# Patient Record
Sex: Female | Born: 1965 | Race: Black or African American | Hispanic: No | Marital: Married | Smoking: Never smoker
Health system: Southern US, Community
[De-identification: ages and names within clinical notes are randomized; demographics above are authoritative.]

## PROBLEM LIST (undated history)

## (undated) DIAGNOSIS — K228 Other specified diseases of esophagus: Secondary | ICD-10-CM

## (undated) DIAGNOSIS — T4145XA Adverse effect of unspecified anesthetic, initial encounter: Secondary | ICD-10-CM

## (undated) DIAGNOSIS — K2281 Esophageal polyp: Secondary | ICD-10-CM

## (undated) DIAGNOSIS — J302 Other seasonal allergic rhinitis: Secondary | ICD-10-CM

## (undated) DIAGNOSIS — D563 Thalassemia minor: Secondary | ICD-10-CM

## (undated) DIAGNOSIS — F419 Anxiety disorder, unspecified: Secondary | ICD-10-CM

## (undated) DIAGNOSIS — J45909 Unspecified asthma, uncomplicated: Secondary | ICD-10-CM

## (undated) DIAGNOSIS — D649 Anemia, unspecified: Secondary | ICD-10-CM

## (undated) DIAGNOSIS — T8859XA Other complications of anesthesia, initial encounter: Secondary | ICD-10-CM

## (undated) DIAGNOSIS — K219 Gastro-esophageal reflux disease without esophagitis: Secondary | ICD-10-CM

## (undated) DIAGNOSIS — R7303 Prediabetes: Secondary | ICD-10-CM

## (undated) DIAGNOSIS — I10 Essential (primary) hypertension: Secondary | ICD-10-CM

## (undated) DIAGNOSIS — K5909 Other constipation: Secondary | ICD-10-CM

## (undated) DIAGNOSIS — E669 Obesity, unspecified: Secondary | ICD-10-CM

## (undated) DIAGNOSIS — I8500 Esophageal varices without bleeding: Secondary | ICD-10-CM

## (undated) DIAGNOSIS — K760 Fatty (change of) liver, not elsewhere classified: Secondary | ICD-10-CM

## (undated) HISTORY — PX: ECTOPIC PREGNANCY SURGERY: SHX613

## (undated) HISTORY — DX: Fatty (change of) liver, not elsewhere classified: K76.0

## (undated) HISTORY — DX: Obesity, unspecified: E66.9

## (undated) HISTORY — DX: Anxiety disorder, unspecified: F41.9

## (undated) HISTORY — PX: MOUTH SURGERY: SHX715

## (undated) HISTORY — DX: Other constipation: K59.09

## (undated) HISTORY — DX: Esophageal polyp: K22.81

## (undated) HISTORY — PX: PARTIAL HYSTERECTOMY: SHX80

## (undated) HISTORY — DX: Gastro-esophageal reflux disease without esophagitis: K21.9

## (undated) HISTORY — DX: Other specified diseases of esophagus: K22.8

## (undated) HISTORY — DX: Essential (primary) hypertension: I10

## (undated) HISTORY — DX: Other seasonal allergic rhinitis: J30.2

## (undated) HISTORY — PX: TUBAL LIGATION: SHX77

---

## 1985-09-27 HISTORY — PX: OTHER SURGICAL HISTORY: SHX169

## 1995-09-28 HISTORY — PX: CHOLECYSTECTOMY: SHX55

## 1997-04-30 HISTORY — PX: UPPER GASTROINTESTINAL ENDOSCOPY: SHX188

## 2001-05-01 ENCOUNTER — Ambulatory Visit (HOSPITAL_COMMUNITY): Admission: RE | Admit: 2001-05-01 | Discharge: 2001-05-01 | Payer: Self-pay | Admitting: Otolaryngology

## 2001-05-01 ENCOUNTER — Encounter: Payer: Self-pay | Admitting: Otolaryngology

## 2001-08-18 ENCOUNTER — Ambulatory Visit (HOSPITAL_COMMUNITY): Admission: RE | Admit: 2001-08-18 | Discharge: 2001-08-18 | Payer: Self-pay | Admitting: Internal Medicine

## 2001-08-18 ENCOUNTER — Encounter (INDEPENDENT_AMBULATORY_CARE_PROVIDER_SITE_OTHER): Payer: Self-pay | Admitting: Internal Medicine

## 2001-11-28 ENCOUNTER — Ambulatory Visit (HOSPITAL_COMMUNITY): Admission: RE | Admit: 2001-11-28 | Discharge: 2001-11-28 | Payer: Self-pay | Admitting: Family Medicine

## 2001-11-28 ENCOUNTER — Encounter: Payer: Self-pay | Admitting: Family Medicine

## 2002-08-03 ENCOUNTER — Encounter: Payer: Self-pay | Admitting: Internal Medicine

## 2002-08-03 ENCOUNTER — Emergency Department (HOSPITAL_COMMUNITY): Admission: EM | Admit: 2002-08-03 | Discharge: 2002-08-04 | Payer: Self-pay | Admitting: Internal Medicine

## 2002-10-05 ENCOUNTER — Ambulatory Visit (HOSPITAL_COMMUNITY): Admission: RE | Admit: 2002-10-05 | Discharge: 2002-10-05 | Payer: Self-pay | Admitting: Otolaryngology

## 2002-10-05 ENCOUNTER — Encounter: Payer: Self-pay | Admitting: Otolaryngology

## 2002-11-15 ENCOUNTER — Emergency Department (HOSPITAL_COMMUNITY): Admission: EM | Admit: 2002-11-15 | Discharge: 2002-11-15 | Payer: Self-pay | Admitting: Emergency Medicine

## 2003-10-10 ENCOUNTER — Ambulatory Visit (HOSPITAL_COMMUNITY): Admission: RE | Admit: 2003-10-10 | Discharge: 2003-10-10 | Payer: Self-pay | Admitting: Family Medicine

## 2004-03-06 ENCOUNTER — Emergency Department (HOSPITAL_COMMUNITY): Admission: EM | Admit: 2004-03-06 | Discharge: 2004-03-06 | Payer: Self-pay | Admitting: Emergency Medicine

## 2004-03-31 ENCOUNTER — Encounter: Admission: RE | Admit: 2004-03-31 | Discharge: 2004-03-31 | Payer: Self-pay | Admitting: Oncology

## 2004-03-31 ENCOUNTER — Encounter (HOSPITAL_COMMUNITY): Admission: RE | Admit: 2004-03-31 | Discharge: 2004-04-30 | Payer: Self-pay | Admitting: Oncology

## 2004-04-17 ENCOUNTER — Ambulatory Visit (HOSPITAL_COMMUNITY): Admission: RE | Admit: 2004-04-17 | Discharge: 2004-04-17 | Payer: Self-pay | Admitting: Family Medicine

## 2004-05-05 ENCOUNTER — Encounter: Admission: RE | Admit: 2004-05-05 | Discharge: 2004-05-05 | Payer: Self-pay | Admitting: Oncology

## 2004-05-11 ENCOUNTER — Ambulatory Visit (HOSPITAL_COMMUNITY): Admission: RE | Admit: 2004-05-11 | Discharge: 2004-05-11 | Payer: Self-pay | Admitting: Internal Medicine

## 2004-05-11 HISTORY — PX: UPPER GASTROINTESTINAL ENDOSCOPY: SHX188

## 2004-08-10 ENCOUNTER — Ambulatory Visit: Payer: Self-pay | Admitting: Internal Medicine

## 2004-09-07 ENCOUNTER — Ambulatory Visit (HOSPITAL_COMMUNITY): Admission: RE | Admit: 2004-09-07 | Discharge: 2004-09-07 | Payer: Self-pay | Admitting: *Deleted

## 2004-10-02 ENCOUNTER — Ambulatory Visit: Payer: Self-pay | Admitting: Family Medicine

## 2005-02-05 ENCOUNTER — Ambulatory Visit: Payer: Self-pay | Admitting: Family Medicine

## 2005-06-10 ENCOUNTER — Ambulatory Visit: Payer: Self-pay | Admitting: Family Medicine

## 2005-07-29 ENCOUNTER — Ambulatory Visit: Payer: Self-pay | Admitting: Family Medicine

## 2006-03-01 ENCOUNTER — Ambulatory Visit: Payer: Self-pay | Admitting: Family Medicine

## 2006-05-26 ENCOUNTER — Ambulatory Visit: Payer: Self-pay | Admitting: Family Medicine

## 2006-08-04 ENCOUNTER — Ambulatory Visit: Payer: Self-pay | Admitting: Family Medicine

## 2006-11-10 ENCOUNTER — Ambulatory Visit (HOSPITAL_COMMUNITY): Admission: RE | Admit: 2006-11-10 | Discharge: 2006-11-10 | Payer: Self-pay | Admitting: Family Medicine

## 2006-11-10 ENCOUNTER — Ambulatory Visit: Payer: Self-pay | Admitting: Family Medicine

## 2006-11-10 LAB — CONVERTED CEMR LAB
Iron: 26 ug/dL — ABNORMAL LOW (ref 42–145)
Retic Count, Absolute: 32.9 (ref 19.0–186.0)
Retic Ct Pct: 0.6 % (ref 0.4–3.1)
UIBC: 390 ug/dL
Vitamin B-12: 336 pg/mL (ref 211–911)

## 2007-04-24 ENCOUNTER — Ambulatory Visit: Payer: Self-pay | Admitting: Family Medicine

## 2007-04-25 ENCOUNTER — Encounter: Payer: Self-pay | Admitting: Family Medicine

## 2007-05-01 ENCOUNTER — Encounter: Payer: Self-pay | Admitting: Family Medicine

## 2007-05-01 LAB — CONVERTED CEMR LAB
ALT: 18 units/L (ref 0–35)
Basophils Relative: 1 % (ref 0–1)
Bilirubin, Direct: 0.2 mg/dL (ref 0.0–0.3)
Chloride: 107 meq/L (ref 96–112)
Cholesterol: 147 mg/dL (ref 0–200)
Glucose, Bld: 91 mg/dL (ref 70–99)
HCT: 37.1 % (ref 36.0–46.0)
Hemoglobin: 10.8 g/dL — ABNORMAL LOW (ref 12.0–15.0)
Iron: 36 ug/dL — ABNORMAL LOW (ref 42–145)
Lymphocytes Relative: 74 % — ABNORMAL HIGH (ref 12–46)
Lymphs Abs: 3.7 10*3/uL — ABNORMAL HIGH (ref 0.7–3.3)
MCHC: 29.1 g/dL — ABNORMAL LOW (ref 30.0–36.0)
MCV: 65.2 fL — ABNORMAL LOW (ref 78.0–100.0)
Monocytes Relative: 6 % (ref 3–11)
Neutro Abs: 0.9 10*3/uL — ABNORMAL LOW (ref 1.7–7.7)
Neutrophils Relative %: 17 % — ABNORMAL LOW (ref 43–77)
Potassium: 4.5 meq/L (ref 3.5–5.3)
Retic Count, Absolute: 79.7 (ref 19.0–186.0)
Saturation Ratios: 9 % — ABNORMAL LOW (ref 20–55)
Sodium: 140 meq/L (ref 135–145)
TIBC: 408 ug/dL (ref 250–470)
TSH: 0.522 microintl units/mL (ref 0.350–5.50)
Total CHOL/HDL Ratio: 4.7
Total Protein: 8.4 g/dL — ABNORMAL HIGH (ref 6.0–8.3)
UIBC: 372 ug/dL
VLDL: 26 mg/dL (ref 0–40)

## 2007-05-02 ENCOUNTER — Ambulatory Visit (HOSPITAL_COMMUNITY): Admission: RE | Admit: 2007-05-02 | Discharge: 2007-05-02 | Payer: Self-pay | Admitting: Family Medicine

## 2007-05-17 ENCOUNTER — Ambulatory Visit (HOSPITAL_COMMUNITY): Admission: RE | Admit: 2007-05-17 | Discharge: 2007-05-17 | Payer: Self-pay | Admitting: Family Medicine

## 2007-05-23 ENCOUNTER — Encounter: Admission: RE | Admit: 2007-05-23 | Discharge: 2007-05-23 | Payer: Self-pay | Admitting: Family Medicine

## 2007-05-30 ENCOUNTER — Encounter: Admission: RE | Admit: 2007-05-30 | Discharge: 2007-05-30 | Payer: Self-pay | Admitting: Family Medicine

## 2007-06-26 ENCOUNTER — Ambulatory Visit: Payer: Self-pay | Admitting: Family Medicine

## 2007-06-26 LAB — CONVERTED CEMR LAB
Ferritin: 181 ng/mL (ref 10–291)
Folate: 9.5 ng/mL
Hemoglobin: 11.9 g/dL — ABNORMAL LOW (ref 12.0–15.0)
Lymphocytes Relative: 60 % — ABNORMAL HIGH (ref 12–46)
Monocytes Absolute: 0.5 10*3/uL (ref 0.2–0.7)
Monocytes Relative: 7 % (ref 3–11)
Neutro Abs: 2.3 10*3/uL (ref 1.7–7.7)
RBC: 5.54 M/uL — ABNORMAL HIGH (ref 3.87–5.11)
Retic Ct Pct: 1.7 % (ref 0.4–3.1)
UIBC: 280 ug/dL

## 2007-06-27 ENCOUNTER — Encounter: Admission: RE | Admit: 2007-06-27 | Discharge: 2007-06-27 | Payer: Self-pay | Admitting: Family Medicine

## 2007-10-24 ENCOUNTER — Ambulatory Visit: Payer: Self-pay | Admitting: Family Medicine

## 2008-02-14 ENCOUNTER — Ambulatory Visit: Payer: Self-pay | Admitting: Family Medicine

## 2008-02-20 DIAGNOSIS — J309 Allergic rhinitis, unspecified: Secondary | ICD-10-CM | POA: Insufficient documentation

## 2008-02-20 DIAGNOSIS — K219 Gastro-esophageal reflux disease without esophagitis: Secondary | ICD-10-CM | POA: Insufficient documentation

## 2008-02-20 DIAGNOSIS — Z6841 Body Mass Index (BMI) 40.0 and over, adult: Secondary | ICD-10-CM

## 2008-02-20 DIAGNOSIS — F411 Generalized anxiety disorder: Secondary | ICD-10-CM | POA: Insufficient documentation

## 2008-03-11 HISTORY — PX: TOTAL ABDOMINAL HYSTERECTOMY: SHX209

## 2008-06-13 ENCOUNTER — Ambulatory Visit: Payer: Self-pay | Admitting: Family Medicine

## 2008-06-13 DIAGNOSIS — M79609 Pain in unspecified limb: Secondary | ICD-10-CM

## 2008-06-13 DIAGNOSIS — R51 Headache: Secondary | ICD-10-CM

## 2008-06-13 DIAGNOSIS — R519 Headache, unspecified: Secondary | ICD-10-CM | POA: Insufficient documentation

## 2008-06-18 DIAGNOSIS — I10 Essential (primary) hypertension: Secondary | ICD-10-CM | POA: Insufficient documentation

## 2008-06-28 ENCOUNTER — Ambulatory Visit (HOSPITAL_COMMUNITY): Admission: RE | Admit: 2008-06-28 | Discharge: 2008-06-28 | Payer: Self-pay | Admitting: Family Medicine

## 2008-06-28 ENCOUNTER — Encounter: Payer: Self-pay | Admitting: Family Medicine

## 2008-07-01 ENCOUNTER — Encounter: Payer: Self-pay | Admitting: Family Medicine

## 2008-07-05 ENCOUNTER — Encounter: Payer: Self-pay | Admitting: Orthopedic Surgery

## 2008-08-06 ENCOUNTER — Ambulatory Visit: Payer: Self-pay | Admitting: Family Medicine

## 2008-08-06 DIAGNOSIS — R109 Unspecified abdominal pain: Secondary | ICD-10-CM

## 2008-08-07 ENCOUNTER — Ambulatory Visit: Payer: Self-pay | Admitting: Orthopedic Surgery

## 2008-08-07 DIAGNOSIS — M7150 Other bursitis, not elsewhere classified, unspecified site: Secondary | ICD-10-CM

## 2008-08-08 ENCOUNTER — Encounter: Payer: Self-pay | Admitting: Orthopedic Surgery

## 2008-09-27 HISTORY — PX: BREAST BIOPSY: SHX20

## 2008-12-04 ENCOUNTER — Telehealth: Payer: Self-pay | Admitting: Family Medicine

## 2009-02-05 ENCOUNTER — Ambulatory Visit: Payer: Self-pay | Admitting: Family Medicine

## 2009-02-05 DIAGNOSIS — R110 Nausea: Secondary | ICD-10-CM | POA: Insufficient documentation

## 2009-02-05 DIAGNOSIS — J0100 Acute maxillary sinusitis, unspecified: Secondary | ICD-10-CM | POA: Insufficient documentation

## 2009-02-06 ENCOUNTER — Encounter: Payer: Self-pay | Admitting: Family Medicine

## 2009-02-06 ENCOUNTER — Telehealth: Payer: Self-pay | Admitting: Family Medicine

## 2009-02-06 LAB — CONVERTED CEMR LAB
BUN: 8 mg/dL (ref 6–23)
Basophils Absolute: 0 10*3/uL (ref 0.0–0.1)
Basophils Relative: 1 % (ref 0–1)
Cholesterol: 136 mg/dL (ref 0–200)
Eosinophils Relative: 1 % (ref 0–5)
Glucose, Bld: 75 mg/dL (ref 70–99)
HCT: 38.9 % (ref 36.0–46.0)
Hemoglobin: 12.5 g/dL (ref 12.0–15.0)
MCHC: 32.1 g/dL (ref 30.0–36.0)
Monocytes Absolute: 0.5 10*3/uL (ref 0.1–1.0)
Potassium: 4.5 meq/L (ref 3.5–5.3)
RDW: 15.5 % (ref 11.5–15.5)
TSH: 0.586 microintl units/mL (ref 0.350–4.500)
VLDL: 16 mg/dL (ref 0–40)

## 2009-03-17 ENCOUNTER — Ambulatory Visit: Payer: Self-pay | Admitting: Family Medicine

## 2009-04-04 ENCOUNTER — Telehealth: Payer: Self-pay | Admitting: Family Medicine

## 2009-06-10 ENCOUNTER — Ambulatory Visit: Payer: Self-pay | Admitting: Family Medicine

## 2009-06-22 DIAGNOSIS — M25572 Pain in left ankle and joints of left foot: Secondary | ICD-10-CM

## 2009-08-28 ENCOUNTER — Telehealth: Payer: Self-pay | Admitting: Family Medicine

## 2009-09-27 HISTORY — PX: BREAST EXCISIONAL BIOPSY: SUR124

## 2009-11-06 ENCOUNTER — Telehealth: Payer: Self-pay | Admitting: Family Medicine

## 2009-11-10 ENCOUNTER — Ambulatory Visit: Payer: Self-pay | Admitting: Family Medicine

## 2009-11-10 DIAGNOSIS — M940 Chondrocostal junction syndrome [Tietze]: Secondary | ICD-10-CM

## 2009-11-10 LAB — CONVERTED CEMR LAB
Bilirubin Urine: NEGATIVE
Blood in Urine, dipstick: NEGATIVE
Glucose, Urine, Semiquant: NEGATIVE
Ketones, urine, test strip: NEGATIVE
Nitrite: POSITIVE
Protein, U semiquant: NEGATIVE
Specific Gravity, Urine: 1.015
Urobilinogen, UA: 0.2
pH: 6.5

## 2009-11-11 ENCOUNTER — Encounter: Payer: Self-pay | Admitting: Family Medicine

## 2009-11-11 DIAGNOSIS — L851 Acquired keratosis [keratoderma] palmaris et plantaris: Secondary | ICD-10-CM | POA: Insufficient documentation

## 2010-01-12 ENCOUNTER — Ambulatory Visit: Payer: Self-pay | Admitting: Family Medicine

## 2010-01-26 ENCOUNTER — Ambulatory Visit: Payer: Self-pay | Admitting: Orthopedic Surgery

## 2010-01-26 DIAGNOSIS — M6530 Trigger finger, unspecified finger: Secondary | ICD-10-CM | POA: Insufficient documentation

## 2010-04-02 ENCOUNTER — Telehealth: Payer: Self-pay | Admitting: Family Medicine

## 2010-04-06 ENCOUNTER — Ambulatory Visit: Payer: Self-pay | Admitting: Family Medicine

## 2010-04-06 DIAGNOSIS — R1013 Epigastric pain: Secondary | ICD-10-CM | POA: Insufficient documentation

## 2010-04-06 DIAGNOSIS — K5900 Constipation, unspecified: Secondary | ICD-10-CM | POA: Insufficient documentation

## 2010-04-08 ENCOUNTER — Telehealth: Payer: Self-pay | Admitting: Family Medicine

## 2010-04-08 ENCOUNTER — Encounter: Admission: RE | Admit: 2010-04-08 | Discharge: 2010-04-08 | Payer: Self-pay | Admitting: Family Medicine

## 2010-04-14 ENCOUNTER — Encounter: Payer: Self-pay | Admitting: Family Medicine

## 2010-04-20 ENCOUNTER — Ambulatory Visit: Payer: Self-pay | Admitting: Orthopedic Surgery

## 2010-04-22 ENCOUNTER — Encounter: Payer: Self-pay | Admitting: Family Medicine

## 2010-04-23 LAB — CONVERTED CEMR LAB
CO2: 22 meq/L (ref 19–32)
Calcium: 8.9 mg/dL (ref 8.4–10.5)
Eosinophils Relative: 2 % (ref 0–5)
Glucose, Bld: 76 mg/dL (ref 70–99)
HCT: 39.9 % (ref 36.0–46.0)
Hemoglobin: 12.8 g/dL (ref 12.0–15.0)
Lymphocytes Relative: 72 % — ABNORMAL HIGH (ref 12–46)
Lymphs Abs: 5.1 10*3/uL — ABNORMAL HIGH (ref 0.7–4.0)
Monocytes Relative: 7 % (ref 3–12)
Platelets: 209 10*3/uL (ref 150–400)
Potassium: 4.5 meq/L (ref 3.5–5.3)
RBC: 5.54 M/uL — ABNORMAL HIGH (ref 3.87–5.11)
Sodium: 139 meq/L (ref 135–145)
TSH: 0.916 microintl units/mL (ref 0.350–4.500)
Total CHOL/HDL Ratio: 4.2
VLDL: 22 mg/dL (ref 0–40)
Vit D, 25-Hydroxy: 13 ng/mL — ABNORMAL LOW (ref 30–89)
WBC: 7 10*3/uL (ref 4.0–10.5)

## 2010-04-28 ENCOUNTER — Emergency Department (HOSPITAL_COMMUNITY): Admission: EM | Admit: 2010-04-28 | Discharge: 2010-04-28 | Payer: Self-pay | Admitting: Emergency Medicine

## 2010-05-04 ENCOUNTER — Encounter: Payer: Self-pay | Admitting: Family Medicine

## 2010-07-07 ENCOUNTER — Ambulatory Visit: Payer: Self-pay | Admitting: Family Medicine

## 2010-07-12 DIAGNOSIS — R928 Other abnormal and inconclusive findings on diagnostic imaging of breast: Secondary | ICD-10-CM | POA: Insufficient documentation

## 2010-08-06 ENCOUNTER — Encounter: Payer: Self-pay | Admitting: Family Medicine

## 2010-08-07 ENCOUNTER — Ambulatory Visit (HOSPITAL_COMMUNITY): Admission: RE | Admit: 2010-08-07 | Discharge: 2010-08-07 | Payer: Self-pay | Admitting: General Surgery

## 2010-08-07 ENCOUNTER — Encounter: Admission: RE | Admit: 2010-08-07 | Discharge: 2010-08-07 | Payer: Self-pay | Admitting: General Surgery

## 2010-10-18 ENCOUNTER — Encounter: Payer: Self-pay | Admitting: Family Medicine

## 2010-10-19 ENCOUNTER — Encounter: Payer: Self-pay | Admitting: Family Medicine

## 2010-10-29 NOTE — Progress Notes (Signed)
Summary: refills and lab  Phone Note Call from Patient   Summary of Call: pt needs refills on nexium and effexor and [facility]rol [facility] [place] [phone]  pt also needs for nurse to fax over lab sheet from back in may.  Initial call taken by: Lenn Cal,  April 08, 2010 11:56 AM  Follow-up for Phone Call        Rx Called In Follow-up by: Baldomero Lamy LPN,  April 09, 8591 9:24 PM    Prescriptions: VENLAFAXINE HCL 37.5 MG TABS (VENLAFAXINE HCL) Take 1 tablet by mouth two times a day  #60 Tablet x 4   Entered by:   Baldomero Lamy LPN   Authorized by:   [name] MD   Signed by:   Baldomero Lamy LPN on 46/28/6381   Method used:   [id number]       [address], [place]  [address]       Ph: [phone] or [phone]       Fax: [phone]   RxID:   [id number] NEXIUM 40 MG  PACK (ESOMEPRAZOLE MAGNESIUM) Take 1 tablet by mouth once a day  #30 Capsule x 4   Entered by:   Baldomero Lamy LPN   Authorized by:   [name] MD   Signed by:   Baldomero Lamy LPN on 61/68/3729   Method used:   [facility]        [id number]       [address], [place]  [address]       Ph: [phone] or [phone]       Fax: [phone]   RxID:   [id number] TOPROL XL 50 MG  TB24 (METOPROLOL SUCCINATE) Take 1 tablet by mouth two times a day  #60 Tablet x 3   Entered by:   Baldomero Lamy LPN   Authorized by:   [name] MD   Signed by:   Baldomero Lamy LPN on 38/88/7579   Method used:   [facility] to        [id number]       [address]       [place], [place]  [address]       Ph: [phone] or [phone]       Fax: [phone]   RxID:   [id number]

## 2010-10-29 NOTE — Assessment & Plan Note (Signed)
Summary: EVAL TREAT RT TRIGGER THUMB/NEED XRAY/REF M.SIMPSON/BCBS/CAF   Vital Signs:  Patient profile:   45 year old female Menstrual status:  regular Height:      63 inches Weight:      274 pounds Pulse rate:   70 / minute Resp:     18 per minute  Vitals Entered By: Arther Abbott MD (April 20, 2010 2:25 PM)  Visit Type:  new problem Referring Provider:  Dr. Moshe Cipro Primary Provider:  [name] MD  CC:  right trigger thumb.  History of Present Illness: I saw Holly Alvarado in the office today for a visit.  She is a 45 years old woman with the complaint of:  right thumb pain with locking.  No xrays needed.  Meds: Effexor, Nexium, ASA, Lopressor  C/O pain A1 pulley rt thumb with snapping and catching rt thumb   .  Allergies: 1)  ! * Sulfa Drugs 2)  ! Penicillin 3)  ! * Band Aids 4)  ! * Steri Strips  Past History:  Past Medical History: Last updated: 08/07/2008 ALLERGIC RHINITIS, SEASONAL (ICD-477.0) GENERALIZED ANXIETY DISORDER (ICD-300.02) GERD (ICD-530.81) OBESITY (ICD-278.00) HTN  Past Surgical History: tubal pregnancy [place] for fibroids 03/11/08 wisdom teeth  Family History: TWO CHILDREN MOTHER  LIVING HYPERTENSIVE FATHER  LIVING  HIGH BLOOD PRESSURE, HYPERLIPIDEMIA TWO SISTERS  LIVING  / BOTH HYPERTENSIVE, AND HYPERLIPIDEMIA Family History of Diabetes Hx, family, asthma Family History of Arthritis  Social History: EMPLOYED  REGISTERED NURSE and teacher Married Never Smoked Alcohol use-no Drug use-no small amt of caffeine daily college  Review of Systems Gastrointestinal:  Complains of heartburn and constipation; denies nausea, vomiting, diarrhea, and blood in your stools. Musculoskeletal:  Complains of joint pain and stiffness; denies swelling, instability, redness, heat, and muscle pain. Psychiatric:  Complains of anxiety; denies nervousness, depression, and hallucinations.  The review of  systems is negative for Constitutional, Cardiovascular, Respiratory, Genitourinary, Neurologic, Endocrine, Skin, HEENT, Immunology, and Hemoatologic.  Physical Exam  Msk:  The patient is well developed and nourished, with normal grooming and hygiene. The body habitus is  obesity Pulses:  normal capilarry refill rt thumb  Extremities:  RT Thumb catches  flexion power is normal  the joints of the thumb are stable  tenderness with nodule over the A1 Pulley  Neurologic:  sensation is normal  Skin:  intact without lesions or rashes Psych:  alert and cooperative; normal mood and affect; normal attention span and concentration   Impression & Recommendations:  Problem # 1:  TRIGGER FINGER, THUMB (ICD-727.03) Assessment New  Verbal consent was obtained: The finger was prepped with ethyl chloride and injected with 1:1 injection of .25% sensorcaine, 1cc  and 40 mg of depomedrol, 1cc. There were no complications. for right thumb trigger   Orders: Est. Patient Level III (39767) Joint Aspirate / Injection, Large (20610) Depo- Medrol 3m (JH4193  Patient Instructions: 1)  You have received an injection of cortisone today. You may experience increased pain at the injection site. Apply ice pack to the area for 20 minutes every 2 hours and take 2 xtra strength tylenol every 8 hours. This increased pain will usually resolve in 24 hours. The injection will take effect in 3-10 days.  2)  Please schedule a follow-up appointment as needed.

## 2010-10-29 NOTE — Progress Notes (Signed)
Summary: MEDICINE  Phone Note Call from Patient   Summary of Call: NEEDS HER EFFEXOR FILLED SHE IS OUT AND HE PAHR TOLD HER TO CALL HER   [facility] AT [phone] TO LET HER KNOW Initial call taken by: Dierdre Harness,  November 06, 2009 10:16 AM    Prescriptions: VENLAFAXINE HCL 37.5 MG TABS (VENLAFAXINE HCL) Take 1 tablet by mouth two times a day  #60 Tablet x 0   Entered by:   Jimmey Ralph LPN   Authorized by:   [name] MD   Signed by:   Jimmey Ralph LPN on 59/74/1638   Method used:   [facility] to        [id number]       [address]       [place], [place]  [address]       Ph: [phone] or [phone]       Fax: [phone]   RxID:   [id number]

## 2010-10-29 NOTE — Letter (Signed)
Summary: Letter  Letter   Imported By: Dierdre Harness 04/23/2010 15:16:37  _____________________________________________________________________  External Attachment:    Type:   Image     Comment:   External Document

## 2010-10-29 NOTE — Letter (Signed)
Summary: med review sheet  med review sheet   Imported By: Lenn Cal 07/07/2010 16:56:44  _____________________________________________________________________  External Attachment:    Type:   Image     Comment:   External Document

## 2010-10-29 NOTE — Assessment & Plan Note (Signed)
Summary: OV   Vital Signs:  Patient profile:   45 year old female Menstrual status:  regular Height:      61 inches Weight:      273.50 pounds BMI:     51.86 O2 Sat:      97 % Pulse rate:   68 / minute Pulse rhythm:   regular Resp:     16 per minute BP sitting:   118 / 78  (right arm)  Vitals Entered By: Kate Sable LPN (April 06, 7252 66:44 AM)  Nutrition Counseling: Patient's BMI is greater than 25 and therefore counseled on weight management options. CC: has been having pain in her upper abdomen and states it looks swollen.   CC:  has been having pain in her upper abdomen and states it looks swollen.Marland Kitchen  History of Present Illness: Reports  that she has been doing fairly well. She is unable to lose weight however, she does not stay on dietary restrictions for any time, and is not exercising regularly.  Denies recent fever or chills. Denies sinus pressure, nasal congestion , ear pain or sore throat. Denies chest congestion, or cough productive of sputum. Denies chest pain, palpitations, PND, orthopnea or leg swelling.  Denies change in bowel movements or bloody stool. Denies dysuria , frequency, incontinence or hesitancy.  Denies headaches, vertigo, seizures. Denies depression, anxiety or insomnia.these are all controlled on medication Denies  rash, lesions, or itch.     Current Medications (verified): 1)  Nexium 40 Mg  Pack (Esomeprazole Magnesium) .... Take 1 Tablet By Mouth Once A Day 2)  Toprol Xl 50 Mg  Tb24 (Metoprolol Succinate) .... Take 1 Tablet By Mouth Two Times A Day 3)  Flonase 50 Mcg/act  Susp (Fluticasone Propionate) .... Inhale Two Puffs Per Nostril Once Daily 4)  Aspirin Adult Low Strength 81 Mg Tbec (Aspirin) .... Take 1 Tablet By Mouth Once A Day 5)  Venlafaxine Hcl 37.5 Mg Tabs (Venlafaxine Hcl) .... Take 1 Tablet By Mouth Two Times A Day 6)  Alprazolam 0.25 Mg Tabs (Alprazolam) .Marland Kitchen.. 1 Tab At Bedtime As Needed  Allergies (verified): 1)  ! * Sulfa  Drugs 2)  ! Penicillin  Review of Systems General:  Complains of fatigue. Eyes:  Denies blurring and discharge. GI:  Complains of abdominal pain and indigestion; 1 week h/o abdominal fullness, mainly upper abd, constipation, small bM today after MOM, she is also having central abdominal pain, she has established IBS. MS:  Complains of joint pain and stiffness; right trigger thumb x 1 month. Endo:  Denies cold intolerance, excessive thirst, excessive urination, and heat intolerance. Heme:  Denies abnormal bruising and bleeding. Allergy:  Complains of seasonal allergies.  Physical Exam  General:  alert, well-hydrated, and overweight-appearing.  HEENT: No facial asymmetry,  EOMI,no  sinus tenderness, TM's Clear, oropharynx  pink and moist  Chest: Clear to auscultation bilaterally. CVS: S1, S2, No murmurs, No S3.   Abd: Soft, Nontender.  MS: Adequate ROM spine, hips, shoulders and knees. right trigger thumb Ext: No edema.   CNS: CN 2-12 intact, power tone and sensation normal throughout.   Skin: Intact,excessive dryness and scaling Psych: Good eye contact, normal affect.  Memory intact, not anxious or depressed appearing.     Impression & Recommendations:  Problem # 1:  CONSTIPATION (ICD-564.00) Assessment Deteriorated  Orders: Gastroenterology Referral (GI)  Problem # 2:  ABDOMINAL PAIN, EPIGASTRIC (ICD-789.06) Assessment: Deteriorated  Orders: Gastroenterology Referral (GI)  Problem # 3:  TRIGGER FINGER, THUMB (ICD-727.03)  Assessment: Deteriorated  Orders: Orthopedic Surgeon Referral (Ortho Surgeon)  Problem # 4:  HYPERTENSION (ICD-401.9) Assessment: Unchanged  Her updated medication list for this problem includes:    Toprol Xl 50 Mg Tb24 (Metoprolol succinate) .Marland Kitchen... Take 1 tablet by mouth two times a day  Prior BP: 120/80 (01/12/2010)  Labs Reviewed: K+: 4.5 (02/06/2009) Creat: : 0.84 (02/06/2009)   Chol: 136 (02/06/2009)   HDL: 37 (02/06/2009)   LDL: 83  (02/06/2009)   TG: 81 (02/06/2009)  Problem # 5:  OBESITY (ICD-278.00) Assessment: Deteriorated  Ht: 61 (04/06/2010)   Wt: 273.50 (04/06/2010)   BMI: 51.86 (04/06/2010)  Problem # 6:  GENERALIZED ANXIETY DISORDER (ICD-300.02) Assessment: Improved  Her updated medication list for this problem includes:    Venlafaxine Hcl 37.5 Mg Tabs (Venlafaxine hcl) .Marland Kitchen... Take 1 tablet by mouth two times a day    Alprazolam 0.25 Mg Tabs (Alprazolam) .Marland Kitchen... 1 tab at bedtime as needed  Complete Medication List: 1)  Nexium 40 Mg Pack (Esomeprazole magnesium) .... Take 1 tablet by mouth once a day 2)  Toprol Xl 50 Mg Tb24 (Metoprolol succinate) .... Take 1 tablet by mouth two times a day 3)  Flonase 50 Mcg/act Susp (Fluticasone propionate) .... Inhale two puffs per nostril once daily 4)  Aspirin Adult Low Strength 81 Mg Tbec (Aspirin) .... Take 1 tablet by mouth once a day 5)  Venlafaxine Hcl 37.5 Mg Tabs (Venlafaxine hcl) .... Take 1 tablet by mouth two times a day 6)  Alprazolam 0.25 Mg Tabs (Alprazolam) .Marland Kitchen.. 1 tab at bedtime as needed  Other Orders: Radiology Referral (Radiology)  Patient Instructions: 1)  Please schedule a follow-up appointment in 3 months. 2)  It is important that you exercise regularly at least 20 minutes 5 times a week. If you develop chest pain, have severe difficulty breathing, or feel very tired , stop exercising immediately and seek medical attention. 3)  You need to lose weight. Consider a lower calorie diet and regular exercise.  4)  I recommend weight watchers  5)  You are being referred to ortho, GI and for a mamogram

## 2010-10-29 NOTE — Progress Notes (Signed)
Summary: MEDICINE  Phone Note Call from Patient   Summary of Call: [facility]S [facility] [place] [facility] 7.20.11 WITH DR. Moshe Cipro Initial call taken by: Dierdre Harness,  April 02, 2010 9:20 AM  Follow-up for Phone Call        pls refill x 1 Follow-up by: [name] MD,  April 02, 2010 12:20 PM    Prescriptions: VENLAFAXINE HCL 37.5 MG TABS (VENLAFAXINE HCL) Take 1 tablet by mouth two times a day  #60 Tablet x 0   Entered by:   Baldomero Lamy LPN   Authorized by:   [name] MD   Signed by:   Baldomero Lamy LPN on 79/81/0254   Method used:   [facility]        [id number]       [address]       [place],   [address]       Ph: [phone] or [phone]       Fax: [phone]   RxID:   [id number]

## 2010-10-29 NOTE — Assessment & Plan Note (Signed)
Summary: SICK   Vital Signs:  Patient profile:   45 year old female Menstrual status:  regular Height:      61 inches Weight:      272 pounds BMI:     51.58 O2 Sat:      97 % Pulse rate:   81 / minute Pulse rhythm:   regular Resp:     16 per minute BP sitting:   110 / 80  Vitals Entered By: Kate Sable (November 10, 2009 3:26 PM)  Nutrition Counseling: Patient's BMI is greater than 25 and therefore counseled on weight management options. CC: Thinks she feels something in her left breast, and also having really dry skin. Urine has a funny odor for the past 2 weeks and she has been going more often    CC:  Thinks she feels something in her left breast and and also having really dry skin. Urine has a funny odor for the past 2 weeks and she has been going more often .  History of Present Illness: Reports  thatshe has generally been doing well. Denies recent fever or chills. Denies sinus pressure, nasal congestion , ear pain or sore throat. Denies chest congestion, or cough productive of sputum. Denies  palpitations, PND, orthopnea or leg swelling. Denies abdominal pain, nausea, vomitting, diarrhea or constipation. Denies change in bowel movements or bloody stool.  Denies  joint pain, swelling, or reduced mobility. Denies headaches, vertigo, seizures. Denies depression, anxiety or insomnia.Controlled on once dauily venlafexine. She has recently started exercising and has tried to control her eating in the last 3 weeks with an attempt to lose weight    Allergies: 1)  ! * Sulfa Drugs  Review of Systems      See HPI General:  Complains of fatigue. Eyes:  Denies blurring and discharge. ENT:  Denies hoarseness, nasal congestion, and sinus pressure. CV:  Denies chest pain or discomfort, palpitations, and swelling of hands. Resp:  Denies cough and sputum productive. GI:  Denies abdominal pain, constipation, diarrhea, nausea, and vomiting; 3 aunts have colon cancer. GU:   Complains of urinary frequency; malodoros urine with polyuria and oligouria x 2 weeks, recently started MVI. MS:  Complains of joint redness and muscle; intermittent left chest ant tenderness, aggravated by upper ext movement also by direct pressure. Derm:  Complains of dryness and itching; dry flaking skin from head to toe. Neuro:  Denies headaches and seizures. Psych:  Denies anxiety and depression. Endo:  Denies cold intolerance, excessive hunger, and excessive thirst. Heme:  Denies abnormal bruising and bleeding. Allergy:  Complains of seasonal allergies.  Physical Exam  General:  alert, well-hydrated, and overweight-appearing.  HEENT: No facial asymmetry,  EOMI,no  sinus tenderness, TM's Clear, oropharynx  erythematous   Chest: Clear to auscultation bilaterally.Tender over 3rd and 4th CC junctions CVS: S1, S2, No murmurs, No S3.   Abd: Soft, Nontender.  MS: Adequate ROM spine, hips, shoulders and knees.  Ext: No edema.   CNS: CN 2-12 intact, power tone and sensation normal throughout.   Skin: Intact,excessive dryness and scaling Psych: Good eye contact, normal affect.  Memory intact, not anxious or depressed appearing.     Impression & Recommendations:  Problem # 1:  OBESITY (ICD-278.00) Assessment Deteriorated  Ht: 61 (11/10/2009)   Wt: 272 (11/10/2009)   BMI: 51.58 (11/10/2009)  Problem # 2:  HYPERTENSION (ICD-401.9) Assessment: Unchanged  Her updated medication list for this problem includes:    Toprol Xl 50 Mg Tb24 (Metoprolol succinate) .Marland KitchenMarland KitchenMarland KitchenMarland Kitchen  Take 1 tablet by mouth two times a day  BP today: 110/80 Prior BP: 108/62 (06/10/2009)  Labs Reviewed: K+: 4.5 (02/06/2009) Creat: : 0.84 (02/06/2009)   Chol: 136 (02/06/2009)   HDL: 37 (02/06/2009)   LDL: 83 (02/06/2009)   TG: 81 (02/06/2009)  Problem # 3:  GERD (ICD-530.81) Assessment: Unchanged  Her updated medication list for this problem includes:    Nexium 40 Mg Pack (Esomeprazole magnesium) .Marland Kitchen... Take 1 tablet by  mouth once a day  Problem # 4:  DRY SKIN (ICD-701.1) Assessment: Comment Only betamethasone cream and clobetasol shampoo. Advised tepid baths and use of emolients  Problem # 5:  SPECIAL SCREENING FOR MALIGNANT NEOPLASMS COLON (ICD-V76.51) Assessment: Comment Only  Orders: Gastroenterology Referral (GI) three aunts with breast cancer, referred for consideration of colonscopy  Problem # 6:  ACUTE CYSTITIS (ICD-595.0) Assessment: Comment Only  The following medications were removed from the medication list:    Penicillin V Potassium 500 Mg Tabs (Penicillin v potassium) .Marland Kitchen... Take 1 tablet by mouth three times a day Her updated medication list for this problem includes:    Ciprofloxacin Hcl 500 Mg Tabs (Ciprofloxacin hcl) .Marland Kitchen... Take 1 tablet by mouth once a day  Orders: UA Dipstick W/ Micro (manual) (81000) T-Culture, Urine (73428-76811)  Encouraged to push clear liquids, get enough rest, and take acetaminophen as needed. To be seen in 10 days if no improvement, sooner if worse.  Problem # 7:  COSTOCHONDRITIS, LEFT (ICD-733.6) Assessment: Comment Only ibuprofen 878m Take 1 tablet by mouth three times a day #30  Complete Medication List: 1)  Nexium 40 Mg Pack (Esomeprazole magnesium) .... Take 1 tablet by mouth once a day 2)  Alprazolam 0.25 Mg Tabs (Alprazolam) .... Take 1 tab by mouth at bedtime 3)  Toprol Xl 50 Mg Tb24 (Metoprolol succinate) .... Take 1 tablet by mouth two times a day 4)  Ferrous Fumarate 325 (106 Fe) Mg Tabs (Ferrous fumarate) .... Take 1 tablet by mouth once a day 5)  Flonase 50 Mcg/act Susp (Fluticasone propionate) .... Inhale two puffs per nostril once daily 6)  Aspirin Adult Low Strength 81 Mg Tbec (Aspirin) .... Take 1 tablet by mouth once a day 7)  Ibuprofen 800 Mg Tabs (Ibuprofen) .... One tab by mouth bid 8)  Cetirizine Hcl 10 Mg Tabs (Cetirizine hcl) .... One tab by mouth qd 9)  Venlafaxine Hcl 37.5 Mg Tabs (Venlafaxine hcl) .... Take 1 tablet by  mouth two times a day 10)  Fluconazole 150 Mg Tabs (Fluconazole) .... Take 1 tablet by mouth once a day as needed 11)  Phentermine Hcl 37.5 Mg Tabs (Phentermine hcl) .... Take 1 tablet by mouth once a day 12)  Betamethasone Dipropionate 0.05 % Crea (Betamethasone dipropionate) .... Apply sparingly to upper and lower extremities twice daily for 10 days , then as needed 13)  Ciprofloxacin Hcl 500 Mg Tabs (Ciprofloxacin hcl) .... Take 1 tablet by mouth once a day 14)  Ibuprofen 800 Mg Tabs (Ibuprofen) .... Take 1 tablet by mouth three times a day 15)  Fluconazole 150 Mg Tabs (Fluconazole) .... Take 1 tablet by mouth once a day as needed  Other Orders: Radiology Referral (Radiology)  Patient Instructions: 1)  Please schedule a follow-up appointment in 2 months. 2)  It is important that you exercise regularly at least 20 minutes 5 times a week. If you develop chest pain, have severe difficulty breathing, or feel very tired , stop exercising immediately and seek medical attention. 3)  You  need to lose weight. Consider a lower calorie diet and regular exercise.  4)  You are being treated for a UTI, and also new meds are sent in for the dry skin, use prescription cream sparingly for 2 weeks , call back if no better. 5)  You will also get a prescription for a shampoo which you can use alog with the nizoral, but use this only once weekly for about 4 weeks. Prescriptions: FLUCONAZOLE 150 MG TABS (FLUCONAZOLE) Take 1 tablet by mouth once a day as needed  #3 x 0   Entered and Authorized by:   [name] MD   Signed by:   [name] MD on 11/10/2009   Method used:   [facility]        [id number]       [address], [place]  [address]       Ph: [phone] or [phone]       Fax: [phone]   RxID:   [id number] IBUPROFEN 800 MG TABS (IBUPROFEN) Take 1 tablet by mouth three times a day  #30 x 0   Entered and  Authorized by:   [name] MD   Signed by:   [name] MD on 11/10/2009   Method used:   [facility]ically to        [id number]       [address], [place]  [address]       Ph: [phone] or [phone]       Fax: [phone]   RxID:   [id number] IBUPROFEN 800 MG TABS (IBUPROFEN) Take 1 tablet by mouth three times a day  #30 x 0   Entered and Authorized by:   [name] MD   Signed by:   [name] MD on 11/10/2009   Method used:   Electronically to        [id number]       [address], [place]  [address]       Ph: [phone] or [phone]       Fax: [phone]   RxID:   [id number] CIPROFLOXACIN HCL 500 MG TABS (CIPROFLOXACIN HCL) Take 1 tablet by mouth once a day  #14 x 0   Entered and Authorized by:   [name] MD   Signed by:   [name] MD on 11/10/2009   Method used:   Electronically to        [id number]       [address], [place]  [address]       Ph: [phone] or [phone]       Fax: [phone]   RxID:   [id number] BETAMETHASONE DIPROPIONATE 0.05 % CREA (BETAMETHASONE DIPROPIONATE) apply sparingly to upper and lower extremities twice daily for 10 days , then as needed  #45 gm x 2   Entered and Authorized by:   [name] MD   Signed by:   [name] MD on 11/10/2009   Method used:   Electronically  to        [id number]       [address], [place]  [address]       Ph: [phone] or [phone]       Fax: [phone]   RxID:   [id number] PHENTERMINE HCL 37.5 MG TABS (PHENTERMINE HCL) Take 1 tablet by mouth once a day  #30 x 0   Entered and Authorized by:   [name] MD   Signed by:   [name] MD on 11/10/2009   Method used:   Printed then  faxed to ...       [id number]       [address]       [place], [place]  [address]       Ph: [phone] or [phone]       Fax: [phone]   RxID:   [id number]   Laboratory Results   Urine Tests  Date/Time Received: November 10, 2009  Date/Time Reported: November 10, 2009   Routine Urinalysis   Color: yellow Appearance: Clear Glucose: negative   (Normal Range: Negative) Bilirubin: negative   (Normal Range: Negative) Ketone: negative   (Normal Range: Negative) Spec. Gravity: 1.015   (Normal Range: 1.003-1.035) Blood: negative   (Normal Range: Negative) pH: 6.5   (Normal Range: 5.0-8.0) Protein: negative   (Normal Range: Negative) Urobilinogen: 0.2   (Normal Range: 0-1) Nitrite: positive   (Normal Range: Negative) Leukocyte Esterace: small   (Normal Range: Negative)

## 2010-10-29 NOTE — Assessment & Plan Note (Signed)
Summary: LEFT HAND PAIN NEEDS XR/BCBS/SIMPSON/BSF   Visit Type:  Follow-up Referring Provider:  Dr. Moshe Cipro  CC:  left hand pain.  History of Present Illness: 45 years old female catching locking LEFT thumb with pain.  She describes her pain as well throbbing and burning, the pain was 8-9 when she moves the thumb and then is to when she keeps it still is constant.  It came on suddenly.  She gets some locking catching and swelling.  Medications: Effexer, Lopressor, ASA, Nexium.  Allergies: 1)  ! * Sulfa Drugs 2)  ! Penicillin  Review of Systems Constitutional:  Denies weight loss, weight gain, fever, chills, and fatigue. Cardiovascular:  Denies chest pain, palpitations, fainting, and murmurs. Respiratory:  Denies short of breath, wheezing, couch, tightness, pain on inspiration, and snoring . Gastrointestinal:  Complains of heartburn; denies nausea, vomiting, diarrhea, constipation, and blood in your stools. Genitourinary:  Denies frequency, urgency, difficulty urinating, painful urination, flank pain, and bleeding in urine. Neurologic:  Denies numbness, tingling, unsteady gait, dizziness, tremors, and seizure. Musculoskeletal:  Complains of joint pain and stiffness; denies swelling, instability, redness, heat, and muscle pain. Endocrine:  Denies excessive thirst, exessive urination, and heat or cold intolerance. Psychiatric:  Denies nervousness, depression, anxiety, and hallucinations. Skin:  Denies changes in the skin, poor healing, rash, itching, and redness. HEENT:  Denies blurred or double vision, eye pain, redness, and watering. Immunology:  Denies seasonal allergies, sinus problems, and allergic to bee stings. Hemoatologic:  Denies easy bleeding and brusing.  Physical Exam  Additional Exam:   * VS reviewed and were normal   *GEN: appearance was normal   ** CDV: normal pulses temperature and no edema  * LYMPH nodes were normal   * SKIN was normal   * Neuro: normal  sensation ** Psyche: AAO x 3 and mood was normal   MSK *Gait was normal  LEFT thumb is tender over the A1 pulley it is swollen.  She does have full range of motion, flexion power is normal, the thumb is stable.   Impression & Recommendations:  Problem # 1:  TRIGGER FINGER, THUMB (ICD-727.03) Assessment New  Verbal consent was obtained: The finger was prepped with ethyl chloride and injected with 1:1 injection of .25% sensorcaine, 1cc  and 40 mg of depomedrol, 1cc. There were no complications.  Orders: New Patient Level III (25053) Joint Aspirate / Injection, Small (20600) Depo- Medrol 72m ([id number]  Patient Instructions: 1)  You have received an injection of cortisone today. You may experience increased pain at the injection site. Apply ice pack to the area for 20 minutes every 2 hours and take 2 xtra strength tylenol every 8 hours. This increased pain will usually resolve in 24 hours. The injection will take effect in 3-10 days.  2)  Please schedule a follow-up appointment as needed.

## 2010-10-29 NOTE — Letter (Signed)
Summary: History form  History form   Imported By: Ruffin Pyo 02/04/2010 15:43:04  _____________________________________________________________________  External Attachment:    Type:   Image     Comment:   External Document

## 2010-10-29 NOTE — Assessment & Plan Note (Signed)
Summary: office visit   Vital Signs:  Patient profile:   45 year old female Menstrual status:  regular Height:      63 inches Weight:      277.50 pounds BMI:     49.33 O2 Sat:      98 % on Room air Pulse rate:   78 / minute Pulse rhythm:   regular Resp:     16 per minute BP sitting:   102 / 80  (left arm)  Vitals Entered By: Louie Casa, CMA  Nutrition Counseling: Patient's BMI is greater than 25 and therefore counseled on weight management options.  O2 Flow:  Room air CC: Follow up   Primary Care Provider:  [name] MD  CC:  Follow up.  History of Present Illness: PT HERE FOR F/U OF CHRONICPROBLEMS. sINCE HER LAST VISIT , SHE HAD AN ED VISIT WHERE SHE RECALLS A NEAR SUYNCOPAL EPISODE WHILE AT THE [facility]IR [facility], MY RECALL IS THAT SHE MAY HAVE HAD SOME MENTAL BREAKDOWN POSSIBLY, SHE HAS NO RECOLLECTION OF THIS ,AND UNFORTUNATELY TO DATE i AM UNABLE TO LOCATE HER ed RECORD. She reports that apart from her constant mental strugglwe withobesity, she has been in a fairly stable health. She recently had a concerning mamogram, and though the biopsy was nl, the radiologists still recommend surgical eval , which i totally support. She has also been having intestinal symptoms for which she is scheduled to have tesing done in the near future.  Current Medications (verified): 1)  Nexium 40 Mg  Pack (Esomeprazole Magnesium) .... Take 1 Tablet By Mouth Once A Day 2)  Toprol Xl 50 Mg  Tb24 (Metoprolol Succinate) .... Take 1 Tablet By Mouth Two Times A Day 3)  Aspirin Adult Low Strength 81 Mg Tbec (Aspirin) .... Take 1 Tablet By Mouth Once A Day 4)  Venlafaxine Hcl 37.5 Mg Tabs (Venlafaxine Hcl) .... Take 1 Tablet By Mouth Two Times A Day 5)  Alprazolam 0.25 Mg Tabs (Alprazolam) .Marland Kitchen.. 1 Tab At Bedtime As Needed 6)  Vitamin D (Ergocalciferol) 50000 Unit Caps (Ergocalciferol) .... One Cap By Mouth Every Week  Allergies (verified): 1)  ! * Sulfa Drugs 2)  ! Penicillin 3)  ! * Band  Aids 4)  ! * Steri Strips  Review of Systems      See HPI General:  Complains of fatigue; denies chills and fever. Eyes:  Denies blurring and discharge. ENT:  Denies hoarseness and nasal congestion. CV:  Denies chest pain or discomfort, palpitations, and swelling of feet. Resp:  Denies cough and sputum productive. GI:  Complains of abdominal pain and indigestion. GU:  Denies dysuria and urinary frequency. MS:  Denies joint pain and stiffness. Psych:  Complains of anxiety. Endo:  Denies excessive thirst and excessive urination. Heme:  Denies abnormal bruising and bleeding. Allergy:  Denies hives or rash and itching eyes.  Physical Exam  General:  alert, well-hydrated, and overweight-appearing.  HEENT: No facial asymmetry,  EOMI,no  sinus tenderness, TM's Clear, oropharynx  pink and moist  Chest: Clear to auscultation bilaterally. CVS: S1, S2, No murmurs, No S3.   Abd: Soft, Nontender.  MS: Adequate ROM spine, hips, shoulders and knees. right trigger thumb Ext: No edema.   CNS: CN 2-12 intact, power tone and sensation normal throughout.   Skin: Intact,no rash Psych: Good eye contact, normal affect.  Memory intact, not anxious or depressed appearing.     Impression & Recommendations:  Problem # 1:  HYPERTENSION (ICD-401.9) Assessment Unchanged  Her updated medication list for this problem includes:    Toprol Xl 50 Mg Tb24 (Metoprolol succinate) .Marland Kitchen... Take 1 tablet by mouth two times a day  BP today: 102/80 Prior BP: 118/78 (04/06/2010)  Labs Reviewed: K+: 4.5 (04/20/2010) Creat: : 0.68 (04/20/2010)   Chol: 144 (04/20/2010)   HDL: 34 (04/20/2010)   LDL: 88 (04/20/2010)   TG: 111 (04/20/2010)  Problem # 2:  GENERALIZED ANXIETY DISORDER (ICD-300.02) Assessment: Deteriorated  The following medications were removed from the medication list:    Alprazolam 0.25 Mg Tabs (Alprazolam) .Marland Kitchen... 1 tab at bedtime as needed Her updated medication list for this problem includes:     Venlafaxine Hcl 37.5 Mg Tabs (Venlafaxine hcl) .Marland Kitchen... Take 1 tablet by mouth two times a day    Alprazolam 0.25 Mg Tabs (Alprazolam) .Marland Kitchen... Take 1 tab by mouth at bedtime need to obtain recent eD records, still unable unfortunately  Problem # 3:  GERD (ICD-530.81) Assessment: Unchanged  Her updated medication list for this problem includes:    Nexium 40 Mg Pack (Esomeprazole magnesium) .Marland Kitchen... Take 1 tablet by mouth once a day  Problem # 4:  OBESITY (ICD-278.00) Assessment: Deteriorated  Ht: 63 (07/07/2010)   Wt: 277.50 (07/07/2010)   BMI: 49.33 (07/07/2010) therapeutic lifestyle change discussed and encouraged  Problem # 5:  ABNORMAL MAMMOGRAM (ICD-793.80) Assessment: Comment Only pt to have surgical consult despite negative biopsy  Complete Medication List: 1)  Nexium 40 Mg Pack (Esomeprazole magnesium) .... Take 1 tablet by mouth once a day 2)  Toprol Xl 50 Mg Tb24 (Metoprolol succinate) .... Take 1 tablet by mouth two times a day 3)  Aspirin Adult Low Strength 81 Mg Tbec (Aspirin) .... Take 1 tablet by mouth once a day 4)  Venlafaxine Hcl 37.5 Mg Tabs (Venlafaxine hcl) .... Take 1 tablet by mouth two times a day 5)  Vitamin D (ergocalciferol) 50000 Unit Caps (Ergocalciferol) .... One cap by mouth every week 6)  Alprazolam 0.25 Mg Tabs (Alprazolam) .... Take 1 tab by mouth at bedtime  Other Orders: T-Vitamin D (25-Hydroxy) ([id number])  Patient Instructions: 1)  Please schedule a follow-up appointment in 3 .37month. 2)  vit d in 3. 5 months 3)  Take meds as discussed pls. 4)  pls do have the surgical eval of your breast. 5)  All the best with your gI eval. 6)  I was very concerned about your ed  visit, and will f/u on this and get back to you. 7)  It is important that you exercise regularly at least 20 minutes 5 times a week. If you develop chest pain, have severe difficulty breathing, or feel very tired , stop exercising immediately and seek medical attention. 8)  You need to  lose weight. Consider a lower calorie diet and regular exercise.  Prescriptions: ALPRAZOLAM 0.25 MG TABS (ALPRAZOLAM) Take 1 tab by mouth at bedtime  #30 x 3   Entered and Authorized by:   MTula NakayamaMD   Signed by:   MTula NakayamaMD on 07/07/2010   Method used:   Printed then faxed to ...       [id number]       [address]      [place]   [id number]      Ph: [phone]      Fax: [phone]  RxID:   [id number]

## 2010-10-29 NOTE — Procedures (Signed)
Summary: Gastroenterology  Gastroenterology   Imported By: Dierdre Harness 05/08/2010 15:54:36  _____________________________________________________________________  External Attachment:    Type:   Image     Comment:   External Document

## 2010-10-29 NOTE — Letter (Signed)
Summary: History form  History form   Imported By: Ruffin Pyo 04/23/2010 11:45:35  _____________________________________________________________________  External Attachment:    Type:   Image     Comment:   External Document

## 2010-10-29 NOTE — Assessment & Plan Note (Signed)
Summary: office visit   Vital Signs:  Patient profile:   45 year old female Menstrual status:  regular Height:      61 inches Weight:      270.25 pounds BMI:     51.25 O2 Sat:      97 % on Room air Pulse rate:   78 / minute Pulse rhythm:   regular Resp:     16 per minute BP sitting:   120 / 80  (left arm)  Vitals Entered By: Baldomero Lamy LPN (January 13, 3015 0:10 PM)  Nutrition Counseling: Patient's BMI is greater than 25 and therefore counseled on weight management options.  O2 Flow:  Room air CC: follow-up visit- finger Is Patient Diabetic? No Pain Assessment Patient in pain? no        CC:  follow-up visit- finger.  History of Present Illness: Reports  that she has been doing fairly well, she staes she is working on weight loss with excellent results.  Denies recent fever or chills. Denies sinus pressure, nasal congestion , ear pain or sore throat. Denies chest congestion, or cough productive of sputum. Denies chest pain, palpitations, PND, orthopnea or leg swelling. Denies abdominal pain, nausea, vomitting, diarrhea or constipation.  Denies change in bowel movements or bloody stool. Denies dysuria , frequency, incontinence or hesitancy. c/o acute left hand pain with weakness swelling and reduced mobilityu of the thumb, had similar episode in thepast with index finger. Denies headaches, vertigo, seizures. Denies depression, anxiety or insomnia. Denies  rash, lesions, or itch. Requests tetanusshot, past due     Current Medications (verified): 1)  Nexium 40 Mg  Pack (Esomeprazole Magnesium) .... Take 1 Tablet By Mouth Once A Day 2)  Alprazolam 0.25 Mg  Tabs (Alprazolam) .... Take 1 Tab By Mouth At Bedtime 3)  Toprol Xl 50 Mg  Tb24 (Metoprolol Succinate) .... Take 1 Tablet By Mouth Two Times A Day 4)  Flonase 50 Mcg/act  Susp (Fluticasone Propionate) .... Inhale Two Puffs Per Nostril Once Daily 5)  Aspirin Adult Low Strength 81 Mg Tbec (Aspirin) .... Take 1  Tablet By Mouth Once A Day 6)  Venlafaxine Hcl 37.5 Mg Tabs (Venlafaxine Hcl) .... Take 1 Tablet By Mouth Two Times A Day 7)  Phentermine Hcl 37.5 Mg Tabs (Phentermine Hcl) .... Take 1 Tablet By Mouth Once A Day  Allergies (verified): 1)  ! * Sulfa Drugs  Review of Systems      See HPI Eyes:  Denies blurring and discharge. MS:  Complains of joint pain, muscle weakness, and stiffness; acute left hand swelling with reduced mobilty and weakness x x 5 days, particularly in the left thumb , has ad this with the index finger and had a good result with tendon injection. Endo:  Denies cold intolerance, excessive hunger, excessive thirst, excessive urination, heat intolerance, polyuria, and weight change. Heme:  Denies abnormal bruising and bleeding. Allergy:  Complains of seasonal allergies.  Physical Exam  General:  alert, well-hydrated, and overweight-appearing.  HEENT: No facial asymmetry,  EOMI,no  sinus tenderness, TM's Clear, oropharynx  erythematous   Chest: Clear to auscultation bilaterally.Tender over 3rd and 4th CC junctions CVS: S1, S2, No murmurs, No S3.   Abd: Soft, Nontender.  MS: Adequate ROM spine, hips, shoulders and knees. decreased power in left hand with tenderness and swellingof left thenar enminence. Ext: No edema.   CNS: CN 2-12 intact, power tone and sensation normal throughout.   Skin: Intact,excessive dryness and scaling Psych: Good eye contact,  normal affect.  Memory intact, not anxious or depressed appearing.     Impression & Recommendations:  Problem # 1:  HAND PAIN, LEFT (ICD-729.5) Assessment Deteriorated  Orders: Orthopedic Referral (Ortho)  Problem # 2:  HYPERTENSION (ICD-401.9) Assessment: Unchanged  Her updated medication list for this problem includes:    Toprol Xl 50 Mg Tb24 (Metoprolol succinate) .Marland Kitchen... Take 1 tablet by mouth two times a day  Orders: T-Basic Metabolic Panel ([id number])  BP today: 120/80 Prior BP: 110/80  (11/10/2009)  Labs Reviewed: K+: 4.5 (02/06/2009) Creat: : 0.84 (02/06/2009)   Chol: 136 (02/06/2009)   HDL: 37 (02/06/2009)   LDL: 83 (02/06/2009)   TG: 81 (02/06/2009)  Problem # 3:  FINGER PAIN (ICD-729.5) Assessment: Improved  Problem # 4:  OBESITY (ICD-278.00) Assessment: Improved  Orders: T-Lipid Profile ([id number]) T-TSH ([id number])  Ht: 61 (01/12/2010)   Wt: 270.25 (01/12/2010)   BMI: 51.25 (01/12/2010), reports forgetting the phentermine often but feels as though it ishelping will take more regularly  Complete Medication List: 1)  Nexium 40 Mg Pack (Esomeprazole magnesium) .... Take 1 tablet by mouth once a day 2)  Alprazolam 0.25 Mg Tabs (Alprazolam) .... Take 1 tab by mouth at bedtime 3)  Toprol Xl 50 Mg Tb24 (Metoprolol succinate) .... Take 1 tablet by mouth two times a day 4)  Flonase 50 Mcg/act Susp (Fluticasone propionate) .... Inhale two puffs per nostril once daily 5)  Aspirin Adult Low Strength 81 Mg Tbec (Aspirin) .... Take 1 tablet by mouth once a day 6)  Venlafaxine Hcl 37.5 Mg Tabs (Venlafaxine hcl) .... Take 1 tablet by mouth two times a day 7)  Phentermine Hcl 37.5 Mg Tabs (Phentermine hcl) .... Take 1 tablet by mouth once a day  Other Orders: Radiology Referral (Radiology) T-CBC w/Diff [phone]) T-Vitamin D (25-Hydroxy) [phone]) Tdap => 56yr IM ((30051 Admin 1st Vaccine ((10211  Patient Instructions: 1)  Please schedule a follow-up appointment in 2 months. 2)  It is important that you exercise regularly at least 20 minutes 5 times a week. If you develop chest pain, have severe difficulty breathing, or feel very tired , stop exercising immediately and seek medical attention. 3)  You need to lose weight. Consider a lower calorie diet and regular exercise.  4)  BMP prior to visit, ICD-9: 5)  Lipid Panel prior to visit, ICD-9:  May 14 or after 6)  TSH prior to visit, ICD-9: 7)  CBC w/ Diff prior to visit, ICD-9: 8)  Vit D level 9)  You will  be referred for a mamo and also to Dr HAline Brochure  Immunizations Administered:  Tetanus Vaccine:    Vaccine Type: Tdap    Site: right deltoid    Mfr: [employer]    Dose: 0.5 ml    Route: IM    Given by: JBaldomero LamyLPN    Exp. Date: 12/20/2011    Lot #: [id number]   VIS given: 08/15/07 version given January 12, 2010.

## 2010-10-29 NOTE — Letter (Signed)
Summary: [facility]   Imported By: Dierdre Harness 08/06/2010 12:57:52  _____________________________________________________________________  External Attachment:    Type:   Image     Comment:   External Document

## 2010-11-17 ENCOUNTER — Telehealth: Payer: Self-pay | Admitting: Family Medicine

## 2010-11-18 ENCOUNTER — Ambulatory Visit (INDEPENDENT_AMBULATORY_CARE_PROVIDER_SITE_OTHER): Payer: BC Managed Care – PPO | Admitting: Family Medicine

## 2010-11-18 ENCOUNTER — Encounter: Payer: Self-pay | Admitting: Family Medicine

## 2010-11-18 DIAGNOSIS — J0100 Acute maxillary sinusitis, unspecified: Secondary | ICD-10-CM

## 2010-11-18 DIAGNOSIS — I10 Essential (primary) hypertension: Secondary | ICD-10-CM

## 2010-11-19 ENCOUNTER — Ambulatory Visit: Payer: Self-pay | Admitting: Family Medicine

## 2010-11-20 LAB — CONVERTED CEMR LAB
Hgb A1c MFr Bld: 5.8 % — ABNORMAL HIGH (ref <5.7)
Vit D, 25-Hydroxy: 16 ng/mL — ABNORMAL LOW (ref 30–89)

## 2010-11-24 NOTE — Progress Notes (Signed)
Summary: sick  Phone Note Call from Patient   Summary of Call: wants to be seen no appoinments ava. she thinks she has sinus been fighting it since last week. Please advise call back at [phone] Initial call taken by: Dierdre Harness,  November 17, 2010 3:52 PM  Follow-up for Phone Call        patient called back  and I put her in at 9:30 in the morning Follow-up by: Dierdre Harness,  November 17, 2010 4:51 PM  Additional Follow-up for Phone Call Additional follow up Details #1::        noted, thanks Additional Follow-up by: [name] MD,  November 17, 2010 4:50 PM

## 2010-12-03 NOTE — Assessment & Plan Note (Signed)
Summary: sinus   Vital Signs:  Patient profile:   45 year old female Menstrual status:  regular Height:      63 inches Weight:      285 pounds BMI:     50.67 O2 Sat:      98 % Pulse rate:   82 / minute Pulse rhythm:   regular Resp:     16 per minute BP sitting:   118 / 82  (left arm)  Vitals Entered By: Kate Sable LPN (November 18, 7562 9:47 AM)  Nutrition Counseling: Patient's BMI is greater than 25 and therefore counseled on weight management options. CC: c/o headache, pressure behind eyes, face hurts, right ear hurts also x 1 week. Also has odor in urine   Primary Care Provider:  [name] MD  CC:  c/o headache, pressure behind eyes, face hurts, and right ear hurts also x 1 week. Also has odor in urine.  History of Present Illness: Pt reports a 12 week h/o generalised aches, maxillary pressure with foul tasting post nasal drainage, body aches and chills. She has also note malodoros urine since this started.  Current Medications (verified): 1)  Nexium 40 Mg  Pack (Esomeprazole Magnesium) .... Take 1 Tablet By Mouth Once A Day 2)  Toprol Xl 50 Mg  Tb24 (Metoprolol Succinate) .... Take 1 Tablet By Mouth Two Times A Day 3)  Aspirin Adult Low Strength 81 Mg Tbec (Aspirin) .... Take 1 Tablet By Mouth Once A Day 4)  Venlafaxine Hcl 37.5 Mg Tabs (Venlafaxine Hcl) .... Take 1 Tablet By Mouth Two Times A Day 5)  Vitamin D (Ergocalciferol) 50000 Unit Caps (Ergocalciferol) .... One Cap By Mouth Every Week 6)  Alprazolam 0.25 Mg Tabs (Alprazolam) .... Take 1 Tab By Mouth At Bedtime  Allergies (verified): 1)  ! * Sulfa Drugs 2)  ! Penicillin 3)  ! * Band Aids 4)  ! * Steri Strips  Past History:  Past Surgical History: tubal pregnancy Garibaldi for fibroids 03/11/08 wisdom teeth Right breast lump removal 08/07/10 negative for malignacy  Review of Systems      See HPI General:  Complains of chills, fatigue, fever, loss of appetite,  malaise, sweats, and weakness; 1 week history. Eyes:  Denies discharge and eye pain. ENT:  Complains of nasal congestion, postnasal drainage, and sinus pressure; 1 week h/o right periorbital pressure, yellow brown post nasal drainage, no taste, not hearing out of right ear. CV:  Denies chest pain or discomfort, palpitations, and swelling of feet. Resp:  Denies cough, shortness of breath, and sputum productive. GI:  Denies bloody stools, constipation, diarrhea, nausea, and vomiting. GU:  Denies dysuria, incontinence, and urinary frequency; malodoros urine x 4 days. MS:  Denies joint pain and stiffness. Derm:  Complains of lesion(s); left inguinal boil x 3 days. Neuro:  Complains of headaches. Psych:  Complains of anxiety and depression; denies mental problems, suicidal thoughts/plans, thoughts of violence, and unusual visions or sounds; controlled on meds. Endo:  Denies cold intolerance, excessive hunger, excessive thirst, excessive urination, and heat intolerance. Heme:  Denies abnormal bruising and bleeding. Allergy:  Complains of seasonal allergies; denies hives or rash and itching eyes.  Physical Exam  General:  alert, well-hydrated, and obese HEENT: No facial asymmetry,  EOMI,maxillary  sinus tenderness, TM's Clear, oropharynx  pink and moistRight anterior cervical adenitis  Chest: Clear to auscultation bilaterally. CVS: S1, S2, No murmurs, No S3.   Abd: Soft, Nontender.  MS: Adequate  ROM spine, hips, shoulders and knees. right trigger thumb Ext: No edema.   CNS: CN 2-12 intact, power tone and sensation normal throughout.   Skin: Intact,no rash, furuncle present in inguinal region Psych: Good eye contact, normal affect.  Memory intact, not anxious or depressed appearing.     Impression & Recommendations:  Problem # 1:  ACUTE MAXILLARY SINUSITIS (ICD-461.0) Assessment Comment Only  Her updated medication list for this problem includes:    Doxycycline Hyclate 100 Mg Tabs  (Doxycycline hyclate) .Marland Kitchen... 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 Take 1 tablet by mouth two times a day  BP today: 118/82 Prior BP: 102/80 (07/07/2010)  Labs Reviewed: K+: 4.5 (04/20/2010) Creat: : 0.68 (04/20/2010)   Chol: 144 (04/20/2010)   HDL: 34 (04/20/2010)   LDL: 88 (04/20/2010)   TG: 111 (04/20/2010)  Problem # 4:  OBESITY (ICD-278.00) Assessment: Deteriorated  Ht: 63 (11/18/2010)   Wt: 285 (11/18/2010)   BMI: 50.67 (11/18/2010) therapeutic lifestyle change discussed and encouraged  Problem # 5:  GENERALIZED ANXIETY DISORDER (ICD-300.02) Assessment: Improved  Her updated medication list for this problem includes:    Venlafaxine Hcl 37.5 Mg Tabs (Venlafaxine hcl) .Marland Kitchen... Take 1 tablet by mouth two times a day    Alprazolam 0.25 Mg Tabs (Alprazolam) .Marland Kitchen... Take 1 tab by mouth at bedtime  Complete Medication List: 1)  Nexium 40 Mg Pack (Esomeprazole magnesium) .... Take 1 tablet by mouth once a day 2)  Toprol Xl 50 Mg Tb24 (Metoprolol succinate) .... Take 1 tablet by mouth two times a day 3)  Aspirin Adult Low Strength 81 Mg Tbec (Aspirin) .... Take 1 tablet by mouth once a day 4)  Venlafaxine Hcl 37.5 Mg Tabs (Venlafaxine hcl) .... Take 1 tablet by mouth two times a day 5)  Vitamin D (ergocalciferol) 50000 Unit Caps (Ergocalciferol) .... One cap by mouth every week 6)  Alprazolam 0.25 Mg Tabs (Alprazolam) .... Take 1 tab by mouth at bedtime 7)  Doxycycline Hyclate 100 Mg Tabs (Doxycycline hyclate) .... Take 1 tablet by mouth two times a day 8)  Fluconazole 150 Mg Tabs (Fluconazole) .... Take 1 tablet by mouth once a day as needed for vaginal itch  Other Orders: T- Hemoglobin A1C  (16109-60454) T-Vitamin D (25-Hydroxy) (09811-91478)  Patient Instructions: 1)  Please schedule a follow-up appointment in 3 months. 2)  It is important that you exercise regularly at least 20 minutes 5 times a week. If you develop chest pain, have severe difficulty breathing, or feel very tired , stop exercising immediately and seek medical attention. 3)  You need to lose weight. Consider a lower calorie diet and regular exercise.  4)   1500 ca diet sheet , also the # to call for lap band surgery in [place]. 5)  Meds are sent in for sinusitis and the boil 6)    7)  HbgA1C prior to visit, ICD-9: amnd vit D today Prescriptions: FLUCONAZOLE 150 MG TABS (FLUCONAZOLE) Take 1 tablet by mouth once a day as needed for vaginal itch  #3 x 0   Entered and Authorized by:   [name] MD   Signed by:   [name] MD on 11/18/2010   Method used:   Electronically  to        [id number] ([facility])       [address], [place]  [address]       Ph: [phone] or [phone]       Fax: [phone]   RxID:   [id number] DOXYCYCLINE HYCLATE 100 MG TABS (DOXYCYCLINE HYCLATE) Take 1 tablet by mouth two times a day  #20 x 0   Entered and Authorized by:   [name] MD   Signed by:   [name] MD on 11/18/2010   Method used:   Electronically to        [id number] ([facility])       [address], [place]  [address]       Ph: [phone] or [phone]       Fax: [phone]   RxID:   [id number]    Orders Added: 1)  Est. Patient Level IV [[address]] 2)  T- Hemoglobin A1C [83036-23375] 3)  T-Vitamin D (25-Hydroxy) [phone] 4)  Orthopedic Referral [Ortho]

## 2010-12-08 LAB — DIFFERENTIAL
Eosinophils Absolute: 0.2 10*3/uL (ref 0.0–0.7)
Lymphocytes Relative: 69 % — ABNORMAL HIGH (ref 12–46)
Lymphs Abs: 4.6 10*3/uL — ABNORMAL HIGH (ref 0.7–4.0)
Neutrophils Relative %: 21 % — ABNORMAL LOW (ref 43–77)

## 2010-12-08 LAB — BASIC METABOLIC PANEL
Calcium: 9.3 mg/dL (ref 8.4–10.5)
Creatinine, Ser: 0.72 mg/dL (ref 0.4–1.2)
GFR calc Af Amer: >60 mL/min (ref >60)

## 2010-12-08 LAB — CBC
MCH: 22.9 pg — ABNORMAL LOW (ref 26.0–34.0)
Platelets: 192 10*3/uL (ref 150–400)
RBC: 5.5 MIL/uL — ABNORMAL HIGH (ref 3.87–5.11)
WBC: 6.8 10*3/uL (ref 4.0–10.5)

## 2010-12-11 LAB — GLUCOSE, CAPILLARY: Glucose-Capillary: 100 mg/dL — ABNORMAL HIGH (ref 70–99)

## 2010-12-30 ENCOUNTER — Encounter (HOSPITAL_BASED_OUTPATIENT_CLINIC_OR_DEPARTMENT_OTHER): Payer: BC Managed Care – PPO | Admitting: Internal Medicine

## 2010-12-30 ENCOUNTER — Ambulatory Visit (HOSPITAL_COMMUNITY)
Admission: RE | Admit: 2010-12-30 | Discharge: 2010-12-30 | Disposition: A | Discharge status: Home / Self Care | Payer: BC Managed Care – PPO | Source: Ambulatory Visit | Attending: Internal Medicine | Admitting: Internal Medicine

## 2010-12-30 ENCOUNTER — Other Ambulatory Visit (INDEPENDENT_AMBULATORY_CARE_PROVIDER_SITE_OTHER): Payer: Self-pay | Admitting: Internal Medicine

## 2010-12-30 DIAGNOSIS — Z7982 Long term (current) use of aspirin: Secondary | ICD-10-CM | POA: Insufficient documentation

## 2010-12-30 DIAGNOSIS — K5900 Constipation, unspecified: Secondary | ICD-10-CM

## 2010-12-30 DIAGNOSIS — K2940 Chronic atrophic gastritis without bleeding: Secondary | ICD-10-CM | POA: Insufficient documentation

## 2010-12-30 DIAGNOSIS — D126 Benign neoplasm of colon, unspecified: Secondary | ICD-10-CM | POA: Insufficient documentation

## 2010-12-30 DIAGNOSIS — R198 Other specified symptoms and signs involving the digestive system and abdomen: Secondary | ICD-10-CM | POA: Insufficient documentation

## 2010-12-30 DIAGNOSIS — Z800 Family history of malignant neoplasm of digestive organs: Secondary | ICD-10-CM

## 2010-12-30 DIAGNOSIS — R1013 Epigastric pain: Secondary | ICD-10-CM

## 2010-12-30 DIAGNOSIS — Z79899 Other long term (current) drug therapy: Secondary | ICD-10-CM | POA: Insufficient documentation

## 2010-12-30 DIAGNOSIS — K2970 Gastritis, unspecified, without bleeding: Secondary | ICD-10-CM

## 2010-12-30 DIAGNOSIS — I10 Essential (primary) hypertension: Secondary | ICD-10-CM | POA: Insufficient documentation

## 2010-12-30 HISTORY — PX: COLONOSCOPY: SHX174

## 2010-12-30 HISTORY — PX: UPPER GASTROINTESTINAL ENDOSCOPY: SHX188

## 2011-01-12 NOTE — Op Note (Signed)
NAMEADJA, RUFF                ACCOUNT NO.:  [id number]  MEDICAL RECORD NO.:  [id number]           PATIENT TYPE:  O  LOCATION:  DAYP                          FACILITY:  [facility]  PHYSICIAN:  Hildred Laser, M.D.    DATE OF BIRTH:  July 12, 1966  DATE OF PROCEDURE:  12/30/2010 DATE OF DISCHARGE:                              OPERATIVE REPORT   PROCEDURE:  Esophagogastroduodenoscopy followed by colonoscopy.  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  She has been treated for H. pylori gastritis over 12 years ago.  She is undergoing diagnostic EGD.  She is also undergoing colonoscopy for diagnostic either screening purposes.  She has noted change in her bowel habits with constipation.  She states her bowels generally move every now and then and become very hard.  Family history is positive for colon carcinoma in three maternal aunts, died at age 59, 73, and 53.  She does not have first-degree relative with CRC. Procedure risks were reviewed with the patient.  Informed consent was obtained.  MEDS FOR CONSCIOUS SEDATION:  Cetacaine spray for oropharyngeal topical anesthesia, Demerol 50 mg IV, Versed 12 mg IV.  FINDINGS:  Procedures performed in endoscopy suite.  The patient's vital signs and O2 sat were monitored during the procedure and remained stable.  PROCEDURE: 1. Esophagogastroduodenoscopy.  The patient was placed in left lateral     recumbent position and Pentax videoscope was passed through     oropharynx without any difficulty into esophagus. 2. Esophagus.  Mucosa of the esophagus normal.  GE junction was wavy,     located at 37 cm from the incisors.  No erosion, no ring, or     stricture was noted. 3. Stomach.  It was empty and distended very well by insufflation.     Folds of proximal stomach are normal.  Examination of mucosa     revealed focal area antrum  with scar and patchy erythema and edema.     This was biopsied for histology.  Pyloric channel was patent.     Angularis, fundus, and cardia were examined by retroflexion scope     and were normal. 4. Duodenum.  Bulbar mucosa was normal.  Scope was passed to second     part of duodenal mucosa and folds were normal.  Endoscope was     withdrawn.  The patient prepared for procedure #2. 5. Colonoscopy.  Rectal examination performed.  No abnormality noted     on external or digital exam.  Pentax videoscope was placed in the     rectum and advanced under vision into sigmoid colon and beyond.     Preparation was satisfactory.  Scope was passed into cecum which     was identified by appendiceal orifice and ileocecal valve.     Pictures were taken for the record.  As the scope was withdrawn,     colonic mucosa was carefully examined.  There were three small     polyps 3-4 mm, all at sigmoid colon.  These were  ablated via cold     biopsy and submitted to one container.  Rectal mucosa was normal.     Scope was retroflexed to examine anorectal junction which was     unremarkable.  Endoscope was withdrawn.  Withdrawal time was 12     minutes.  The patient tolerated the procedure well.  FINAL DIAGNOSES: 1. Focal antral gastritis with a scar.  This area was biopsied for     histology. 2. No evidence of erosive esophagitis or active peptic ulcer disease. 3. Colonoscopy performed to cecum. 4. Three small polyps ablated via cold biopsy from the sigmoid colon     and submitted in one container.  RECOMMENDATIONS: 1. She will continue antireflux measures.  We will switch her to     omeprazole 40 mg p.o. q.a.m. for cost reasons (patient presently on     Nexium). 2. High-fiber diet plus fiber supplement, 3-4 g daily. 3. Levsin SL t.i.d. p.r.n. prescription given for 60 with a refill.     Script also given for omeprazole 40 mg q.a.m. 30 doses with 5     refills. 4. I will be contacting patient with  results of biopsy and further     recommendations.     Hildred Laser, M.D.     NR/MEDQ  D:  12/30/2010  T:  12/31/2010  Job:  [id number]  cc:   Norwood Levo. Moshe Cipro, M.D. Fax: [phone]  Electronically Signed by Hildred Laser M.D. on 01/12/2011 10:46:27 AM

## 2011-02-03 ENCOUNTER — Encounter: Payer: Self-pay | Admitting: Orthopedic Surgery

## 2011-02-03 ENCOUNTER — Ambulatory Visit (INDEPENDENT_AMBULATORY_CARE_PROVIDER_SITE_OTHER): Payer: BC Managed Care – PPO | Admitting: Orthopedic Surgery

## 2011-02-03 DIAGNOSIS — M65319 Trigger thumb, unspecified thumb: Secondary | ICD-10-CM | POA: Insufficient documentation

## 2011-02-03 DIAGNOSIS — M6530 Trigger finger, unspecified finger: Secondary | ICD-10-CM

## 2011-02-03 MED ORDER — METHYLPREDNISOLONE ACETATE 40 MG/ML IJ SUSP: 40.0000 mg | Freq: Once | INTRAMUSCULAR | Status: DC

## 2011-02-03 NOTE — Progress Notes (Signed)
45 year old female, status post injection RIGHT and LEFT thumb for triggering with recurrent triggering, RIGHT seems to be worsening LEFT.  Review of systems negative for locking.  Exam shows tenderness over both A1 pulleys with decreased range of motion of clicking sensation. Perfusion normal.  Injected both A1 pulleys. Follow up as needed

## 2011-02-03 NOTE — Discharge Summary (Signed)
Injection trigger finger.  Verbal consent  Time out.  Alcohol prep.  Ethyl chloride anesthesia.  Medication: Depo-Medrol 40 mg, lidocaine 1% 3 cc  After prep with alcohol and anesthesia with ethyl chloride one to one injection of above medication was placed at the A1 pulley.   Repeated in the LEFT and RIGHT thumb

## 2011-02-11 ENCOUNTER — Encounter: Payer: Self-pay | Admitting: Family Medicine

## 2011-02-12 NOTE — H&P (Signed)
NAME:  Holly Alvarado, Holly Alvarado                          ACCOUNT NO.:  [id number]   MEDICAL RECORD NO.:  [id number]                   PATIENT TYPE:  OUT   LOCATION:  RAD                                  FACILITY:  [facility]   PHYSICIAN:  Hildred Laser, M.D.                 DATE OF BIRTH:  Jan 09, 1966   DATE OF ADMISSION:  04/27/2004  DATE OF DISCHARGE:                                HISTORY & PHYSICAL   PRESENTING COMPLAINT:  1. Epigastric pain.  2. Fatty liver.   HISTORY OF PRESENT ILLNESS:  The patient is a 45 year old African American  female, patient of Dr. Moshe Cipro, who is here for scheduled visit complaining  of epigastric pain that she has had for the last 8-10 weeks.  She previously  was seen in January for followup of chronic GERD.  She states that she was  seen by Dr. Moshe Cipro.  She was switched to _____________, but she did not get  any extra relief.  Therefore, she went back on Nexium.  She states her  heartburn and regurgitation is well controlled.  She has no more than one  episode of heartburn per day.  She has experienced pain in the midepigastric  area usually in the middle of the night.  She has had some nausea but no  vomiting.  She also denies melena or rectal bleeding.  Her appetite is fair.  She has lost 3 pounds since her last visit.  She feels that she is not  eating the right foods.  She is trying to lose weight.  She does not take  any NSAIDs.  The patient was last evaluated in January for right upper  quadrant pain.  At that time, she had abdominal pelvic CT.  It showed fatty  liver and moderate size sliding hiatal hernia.  Her bowels and pancreas were  within normal limits.  She had LFTs recently which were normal.  She was  noted to have elevated globulins, and she has seen Dr. Tressie Stalker.  She has a  history of Helicobacter pylori gastritis, and she received two weeks of  Prevpac in November 2000, and she was able to complete this.   MEDICATIONS:  1. She is on Nexium 40  mg b.i.d.  2. Diovan 160 mg every day.  3. Alavert 50 mg q.h.s.  4. Lopressor 25 mg b.i.d.  5. Flonase spray one puff b.i.d.  6. Zyrtec 10 mg every day.   PAST MEDICAL HISTORY:  1. She has chronic reflux esophagitis.  She had EGD by Dr. [name] in August     1998, which revealed esophagitis and small sliding hiatal hernia.  She     has required a double dose PPI for control of her symptoms.  Reduction     has been recommended but unfortunately she has gained weight instead of     losing it.  She has gained 26 pounds in the  last 4-1/2 years.  She is     status post cholecystectomy in March 2000 at which time she had a large     lymph node removed either from the bile duct or the cystic duct.  She was     told that it showed benign inflammation.  She had preeclampsia with the     first pregnancy.  She had tubal ligation in 1990 but she had tubal     pregnancy in 1992, and again in 1990 leading to a laparotomy.  2. History of Helicobacter pylori infection treated in November 2000 as     above.  3. History of tachycardia for which she was recently begun on beta blocker.  4. She has migraine and seasonal allergies.  5. History of elevated globulin levels.  6. She had SPEP and quantitative globulins by Dr. Tressie Stalker.  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  She is     supposed to be following up with him in the near future.   ALLERGIES:  SULFA causing a rash and dizziness.   FAMILY HISTORY:  Both parents are hypertensive and mother has coronary  artery disease.  She has a brother with elevated liver tests with a liver  biopsy and told to have fatty liver.  He is about 45 years old.  She has a  sister with asthma.   SOCIAL HISTORY:  She is married.  She has three children.  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.  She does not smoke cigarettes  or drink alcohol.   PHYSICAL EXAMINATION:  GENERAL:  Pleasant, obese, African American female   who is in no acute distress.  VITAL SIGNS:  She weighs 275 pounds.  She is 5 feet 2 inches tall.  Pulse 96  per minute.  Blood pressure 110/82.  She is afebrile.  HEENT:  Conjunctivae is pink.  Sclerae is nonicteric.  Oropharyngeal mucosa  is normal.  NECK:  Without masses or thyromegaly.  CARDIAC:  Regular rhythm; normal S1 and S2.  No murmur or gallop noted.  ABDOMEN:  Obese.  Bowel sounds are normal.  Palpation revealed mild to  moderate tenderness in the mid-epigastrium.  The liver edge is indistinct.  It does not appear to be large.  The spleen is not palpable.  RECTAL:  Exam is deferred.  She does not have any peripheral edema.   LABORATORY DATA:  Labs from February 27, 2004: WBC 5.5, H&H 12 and 37.4.  [place] 64  which has been chronically low.  Platelet count 378,000.  Her bilirubin was  0.5, AP 77, AST 31, ALT 23.  Her total protein was 8.9.  Albumin 4.0.  Globulin 4.9.  Her amylase was 96 and lipase 34.   ASSESSMENT:  1. Epigastric pain.  She has been having this pain for the last two months     or so.  It wakes her up in the middle of the night, associated with     nausea.  She is on double dose proton pump inhibitor (PPI) for     gastroesophageal reflux disease (GERD), which has not helped with this     pain.  She was tried on another proton pump inhibitor without any     additional benefit.  She could have relapse of her Helicobacter Pylori     gastritis which was unusual.  We need to make sure she does not have     Helicobacter Pylori and/or  peptic ulcer disease.  Neoplastic process     would be less likely.  She could also have biliary dyskinesis or     similarly she could have sphincter of Oddi dysfunction, but her liver     function tests (LFTs), if anything, are normal.  2. Chronic gastroesophageal reflux disease.  Symptoms are fairly well     controlled with double dose proton pump inhibitor.  It is very important    for her to lose weight which would help with the fatty liver  as well.  3. Fatty liver.  This was diagnosed over 4 years ago.  Her transaminases are     normal which is very reassuring.  Hepatitis B surface antigen, hepatitis     B virus antibody and serum ferritin were normal (53) back in February     2001.  She has never had a liver biopsy.   RECOMMENDATIONS:  1. The patient was advised to change her calorie intake, change her eating     habits and perhaps burn more calories in order to lose weight.  I am very     concerned that if fatty liver progresses, she is at risk for cirrhosis.  2. Diagnostic esophagogastroduodenoscopy will be performed at [facility] in the     near future.  3. In the meantime, we will start her on Levsin S t.i.d. p.r.n.  4. She will continue her Nexium as before.  5. Should she have another episode of severe pain in her upper abdomen, I     would like to check her LFTs 6-12 hours after onset of pain.     ___________________________________________                                         Hildred Laser, M.D.   NR/MEDQ  D:  04/27/2004  T:  04/27/2004  Job:  [id number]   cc:   Norwood Levo. Moshe Cipro, M.D.  [address]  [place], [place] [address]  Fax: [phone]   Gaston Islam. Neijstrom, MD  [address]  [place]  [place] [address]  Fax: [facility]

## 2011-02-12 NOTE — Op Note (Signed)
NAME:  Holly Alvarado, Holly Alvarado                          ACCOUNT NO.:  [id number]   MEDICAL RECORD NO.:  [id number]                   PATIENT TYPE:  AMB   LOCATION:  DAY                                  FACILITY:  [facility]   PHYSICIAN:  Hildred Laser, M.D.                 DATE OF BIRTH:  1965/12/17   DATE OF PROCEDURE:  05/11/2004  DATE OF DISCHARGE:                                 OPERATIVE REPORT   PROCEDURE:  Esophagogastroduodenoscopy.   INDICATIONS:  A 45 year old African-American female with chronic GERD who  was maintained on double dose PPI. She continues to have intermittent  epigastric pain. She was tried on different PPIs without any benefit. She is  status post cholecystectomy. She has been treated for helicobacter pylori  infection in the past. Ultrasound done in the past was negative for dilated  bile duct. She is undergoing a diagnostic esophagogastroduodenoscopy.  Procedure was reviewed with the patient and informed consent was obtained.   PREOPERATIVE MEDICATIONS:  Cetacaine spray for pharyngeal topical  anesthesia. Demerol 50 mg IV, Versed 8 mg IV in divided doses.   FINDINGS:  Procedure performed in endoscopy suite. The patient's vital signs  and O2 saturation monitored during the procedure and remained stable. The  patient was placed in the left lateral position. Olympus video scope was  passed via oropharynx without any difficulty into her esophagus.   Esophagus:  Mucosal of the esophagus normal except there were two tiny  nodules, one distally and one proximally. Biopsy was taken on the way out  for histology. There was a prominent vein in the distal esophagus which did  not appear to be a varix. Once again, pictures taken for the record. GE  junction was __________ . Did not see a sliding hiatal hernia which she has  history of.   Stomach was empty and distended very well with insufflation. Folds of the  proximal stomach were normal. Examination of the mucosa at the  body, antrum,  pyloric channel as well as angularis, fundus and cardia were normal.   Duodenum:  Examination of the bulb reveals normal mucosa. Scope was passed  to the second part of the duodenum where mucosa and folds were normal.  Endoscope was withdrawn, and as noted above, biopsy was taken from the small  polyps and/or nodules for the esophagus __________ container. Endoscope was  withdrawn. The patient tolerated the procedure well.   FINAL DIAGNOSES:  1. Two-small esophageal nodules __________ biopsied for histology and     ablated during this process.  2. Prominent vein at distal esophagus which does not appear to be varix.  3. Normal examination of the stomach, first and second part of the duodenum.   RECOMMENDATIONS:  I would like for her to drop Nexium to 40 mg q.a.m. and  use Levsin SL t.i.d. p.r.n. It is possible that her epigastric pain may be  related to be PPI.  I will be contacting patient with biopsy results and further  recommendations.      ___________________________________________                                            Hildred Laser, M.D.   NR/MEDQ  D:  05/11/2004  T:  05/11/2004  Job:  [id number]

## 2011-02-17 ENCOUNTER — Encounter: Payer: Self-pay | Admitting: Family Medicine

## 2011-02-17 ENCOUNTER — Ambulatory Visit: Payer: BC Managed Care – PPO | Admitting: Family Medicine

## 2011-03-26 ENCOUNTER — Other Ambulatory Visit: Payer: Self-pay | Admitting: Family Medicine

## 2011-04-05 ENCOUNTER — Other Ambulatory Visit: Payer: Self-pay

## 2011-04-05 MED ORDER — ALPRAZOLAM 0.25 MG PO TABS: ORAL_TABLET | ORAL | Status: DC

## 2011-04-21 ENCOUNTER — Other Ambulatory Visit: Payer: Self-pay | Admitting: Family Medicine

## 2011-04-22 ENCOUNTER — Encounter: Payer: Self-pay | Admitting: Family Medicine

## 2011-04-27 ENCOUNTER — Ambulatory Visit: Payer: BC Managed Care – PPO | Admitting: Family Medicine

## 2011-04-27 ENCOUNTER — Encounter: Payer: Self-pay | Admitting: Family Medicine

## 2011-05-13 ENCOUNTER — Encounter (INDEPENDENT_AMBULATORY_CARE_PROVIDER_SITE_OTHER): Payer: Self-pay

## 2011-06-03 ENCOUNTER — Encounter: Payer: Self-pay | Admitting: Family Medicine

## 2011-06-04 ENCOUNTER — Encounter: Payer: Self-pay | Admitting: Family Medicine

## 2011-06-04 ENCOUNTER — Ambulatory Visit (INDEPENDENT_AMBULATORY_CARE_PROVIDER_SITE_OTHER): Payer: BC Managed Care – PPO | Admitting: Family Medicine

## 2011-06-04 VITALS — BP 104/80 | HR 80 | Temp 98.7°F | Resp 16 | Ht 62.0 in | Wt 288.1 lb

## 2011-06-04 DIAGNOSIS — I10 Essential (primary) hypertension: Secondary | ICD-10-CM

## 2011-06-04 DIAGNOSIS — F411 Generalized anxiety disorder: Secondary | ICD-10-CM

## 2011-06-04 DIAGNOSIS — J0100 Acute maxillary sinusitis, unspecified: Secondary | ICD-10-CM

## 2011-06-04 DIAGNOSIS — K219 Gastro-esophageal reflux disease without esophagitis: Secondary | ICD-10-CM

## 2011-06-04 DIAGNOSIS — E669 Obesity, unspecified: Secondary | ICD-10-CM

## 2011-06-04 DIAGNOSIS — R5381 Other malaise: Secondary | ICD-10-CM

## 2011-06-04 DIAGNOSIS — Z139 Encounter for screening, unspecified: Secondary | ICD-10-CM

## 2011-06-04 LAB — LIPID PANEL
LDL Cholesterol: 97 mg/dL (ref 0–99)
VLDL: 19 mg/dL (ref 0–40)

## 2011-06-04 LAB — CBC WITH DIFFERENTIAL/PLATELET
Basophils Absolute: 0 10*3/uL (ref 0.0–0.1)
Basophils Relative: 1 % (ref 0–1)
Eosinophils Absolute: 0.1 10*3/uL (ref 0.0–0.7)
Eosinophils Relative: 2 % (ref 0–5)
HCT: 41.3 % (ref 36.0–46.0)
Hemoglobin: 13 g/dL (ref 12.0–15.0)
MCH: 23.3 pg — ABNORMAL LOW (ref 26.0–34.0)
MCHC: 31.5 g/dL (ref 30.0–36.0)
MCV: 74.1 fL — ABNORMAL LOW (ref 78.0–100.0)
Monocytes Absolute: 0.5 10*3/uL (ref 0.1–1.0)
Monocytes Relative: 9 % (ref 3–12)
RDW: 15.7 % — ABNORMAL HIGH (ref 11.5–15.5)

## 2011-06-04 LAB — TSH: TSH: 0.785 u[IU]/mL (ref 0.350–4.500)

## 2011-06-04 MED ORDER — ESOMEPRAZOLE MAGNESIUM 40 MG PO CPDR: 40.0000 mg | DELAYED_RELEASE_CAPSULE | Freq: Every day | ORAL | Status: DC

## 2011-06-04 MED ORDER — METOPROLOL SUCCINATE ER 50 MG PO TB24: ORAL_TABLET | ORAL | Status: DC

## 2011-06-04 MED ORDER — FLUCONAZOLE 150 MG PO TABS: ORAL_TABLET | ORAL | Status: AC

## 2011-06-04 MED ORDER — ALPRAZOLAM 0.25 MG PO TABS: ORAL_TABLET | ORAL | Status: AC

## 2011-06-04 MED ORDER — PENICILLIN V POTASSIUM 500 MG PO TABS: 500.0000 mg | ORAL_TABLET | Freq: Three times a day (TID) | ORAL | Status: AC

## 2011-06-04 MED ORDER — VENLAFAXINE HCL ER 75 MG PO CP24: 75.0000 mg | ORAL_CAPSULE | Freq: Every day | ORAL | Status: DC

## 2011-06-04 NOTE — Progress Notes (Signed)
  Subjective:    Patient ID: Francina Ames, female    DOB: April 28, 1966, 45 y.o.   MRN: [id number]  HPI 2 week h/o frontal sinus pressure with post nasal drainage which is foul tasting , no fever or chills. Reports anxiety is mainly at night waking hert up, on avg once weekly has physical and mental symptoms of panic, has been out of xanax. Recently told she had yeast in her urine. Fatigue wants to be checked for anemia. No regular exercise, but is working in change in diet with some success   Review of Systems See HPI Denies recent fever or chills. Denies ear pain or sore throat. Denies chest congestion, productive cough or wheezing. Denies chest pains, and leg swelling Denies abdominal pain, nausea, vomiting,diarrhea or constipation.   Denies dysuria, frequency, hesitancy or incontinence. Denies joint pain, swelling and limitation in mobility. Denies headaches, seizures, numbness, or tingling. Uncontrolled anxiety , with frequent panic attack 4 to 5 nights each week.periencing physical symptoms which scare her, has not been taking xanax every night. Also not taking the increased dose of effexor that was prescribed Denies skin break down or rash.        Objective:   Physical Exam  Patient alert and oriented and in no cardiopulmonary distress.  HEENT: No facial asymmetry, EOMI,frontal and maxillary  sinus tenderness,  oropharynx pink and moist.  Neck supple no adenopathy.  Chest: Clear to auscultation bilaterally.  CVS: S1, S2 no murmurs, no S3.  ABD: Soft non tender. Bowel sounds normal.  Ext: No edema  MS: Adequate ROM spine, shoulders, hips and knees.  Skin: Intact, no ulcerations or rash noted.  Psych: Good eye contact, normal affect. Memory intact not anxious or depressed appearing.  CNS: CN 2-12 intact, power, tone and sensation normal throughout.       Assessment & Plan:

## 2011-06-04 NOTE — Assessment & Plan Note (Signed)
Uncontrolled, increase med dose, and xanax nightly for 2 weeks , then try to taper off as able

## 2011-06-04 NOTE — Assessment & Plan Note (Signed)
Controlled, no change in medication  

## 2011-06-04 NOTE — Assessment & Plan Note (Addendum)
Antibiotic prescribed

## 2011-06-04 NOTE — Patient Instructions (Signed)
F/u in 3 months.  It is important that you exercise regularly at least 30 minutes 5 times a week. If you develop chest pain, have severe difficulty breathing, or feel very tired, stop exercising immediately and seek medical attention    A healthy diet is rich in fruit, vegetables and whole grains. Poultry fish, nuts and beans are a healthy choice for protein rather then red meat. A low sodium diet and drinking 64 ounces of water daily is generally recommended. Oils and sweet should be limited. Carbohydrates especially for those who are diabetic or overweight, should be limited to 34-45 gram per meal. It is important to eat on a regular schedule, at least 3 times daily. Snacks should be primarily fruits, vegetables or nuts.    Labs today.  Congrats on weight loss, keep it up.  Medication changes as diiscussed  Weight loss goal of 10 pounds

## 2011-06-04 NOTE — Assessment & Plan Note (Signed)
Improved. Pt applauded on succesful weight loss through lifestyle change, and encouraged to continue same. Weight loss goal set for the next several months.  

## 2011-06-05 LAB — HEMOGLOBIN A1C: Mean Plasma Glucose: 123 mg/dL — ABNORMAL HIGH (ref <117)

## 2011-06-05 LAB — VITAMIN D 25 HYDROXY (VIT D DEFICIENCY, FRACTURES): Vit D, 25-Hydroxy: 22 ng/mL — ABNORMAL LOW (ref 30–89)

## 2011-06-07 ENCOUNTER — Ambulatory Visit (INDEPENDENT_AMBULATORY_CARE_PROVIDER_SITE_OTHER): Payer: BC Managed Care – PPO | Admitting: Internal Medicine

## 2011-06-08 ENCOUNTER — Other Ambulatory Visit: Payer: Self-pay | Admitting: Family Medicine

## 2011-06-08 MED ORDER — VITAMIN D3 1.25 MG (50000 UT) PO CAPS: 50000.0000 [IU] | ORAL_CAPSULE | ORAL | Status: DC

## 2011-06-09 ENCOUNTER — Telehealth: Payer: Self-pay | Admitting: *Deleted

## 2011-06-09 NOTE — Telephone Encounter (Signed)
Message copied by Christia Reading on Wed Jun 09, 2011 10:10 AM ------      Message from: [name] MD E      Created: Tue Jun 08, 2011  4:09 AM       pls advise blood sugars have increased and vit D is low.I am sending in script for supplemental vit D, explain needs to take it for at least 12 weeks      Please advise the patient that their blood sugars are higher than normal.             A normal HbA1C is less than 5.7, please give them their value.            They need to cut back on carb's and sweets, start regular exercise, target at least 30 minutes 5 days a weekly and aim for weight loss.            This will reduce the risk of becoming diabetic or delayed development.      pls encourage her to attend class to change diet and start daily exercise

## 2011-06-09 NOTE — Telephone Encounter (Signed)
Message copied by Christia Reading on Wed Jun 09, 2011 10:06 AM ------      Message from: [name] MD E      Created: Tue Jun 08, 2011  4:09 AM       pls advise blood sugars have increased and vit D is low.I am sending in script for supplemental vit D, explain needs to take it for at least 12 weeks      Please advise the patient that their blood sugars are higher than normal.             A normal HbA1C is less than 5.7, please give them their value.            They need to cut back on carb's and sweets, start regular exercise, target at least 30 minutes 5 days a weekly and aim for weight loss.            This will reduce the risk of becoming diabetic or delayed development.      pls encourage her to attend class to change diet and start daily exercise

## 2011-06-09 NOTE — Telephone Encounter (Signed)
Patient aware of lab results.

## 2011-06-09 NOTE — Telephone Encounter (Signed)
Called patient, left message.

## 2011-06-09 NOTE — Progress Notes (Signed)
Patient aware.

## 2011-06-18 ENCOUNTER — Ambulatory Visit: Payer: BC Managed Care – PPO

## 2011-07-06 ENCOUNTER — Ambulatory Visit (INDEPENDENT_AMBULATORY_CARE_PROVIDER_SITE_OTHER): Payer: BC Managed Care – PPO | Admitting: Internal Medicine

## 2011-07-06 ENCOUNTER — Encounter (INDEPENDENT_AMBULATORY_CARE_PROVIDER_SITE_OTHER): Payer: Self-pay | Admitting: *Deleted

## 2011-08-05 ENCOUNTER — Other Ambulatory Visit: Payer: Self-pay | Admitting: Family Medicine

## 2011-08-05 ENCOUNTER — Ambulatory Visit (INDEPENDENT_AMBULATORY_CARE_PROVIDER_SITE_OTHER): Payer: BC Managed Care – PPO

## 2011-08-05 DIAGNOSIS — Z23 Encounter for immunization: Secondary | ICD-10-CM

## 2011-08-05 DIAGNOSIS — Z9889 Other specified postprocedural states: Secondary | ICD-10-CM

## 2011-08-18 ENCOUNTER — Ambulatory Visit
Admission: RE | Admit: 2011-08-18 | Discharge: 2011-08-18 | Disposition: A | Discharge status: Home / Self Care | Payer: BC Managed Care – PPO | Source: Ambulatory Visit | Attending: Family Medicine | Admitting: Family Medicine

## 2011-08-18 DIAGNOSIS — Z9889 Other specified postprocedural states: Secondary | ICD-10-CM

## 2011-09-01 ENCOUNTER — Encounter: Payer: Self-pay | Admitting: Family Medicine

## 2011-09-03 ENCOUNTER — Ambulatory Visit: Payer: BC Managed Care – PPO | Admitting: Family Medicine

## 2011-09-04 ENCOUNTER — Other Ambulatory Visit: Payer: Self-pay | Admitting: Family Medicine

## 2011-10-06 ENCOUNTER — Encounter: Payer: Self-pay | Admitting: Family Medicine

## 2011-10-07 ENCOUNTER — Encounter: Payer: Self-pay | Admitting: Family Medicine

## 2011-10-07 ENCOUNTER — Ambulatory Visit (INDEPENDENT_AMBULATORY_CARE_PROVIDER_SITE_OTHER): Payer: BC Managed Care – PPO | Admitting: Family Medicine

## 2011-10-07 ENCOUNTER — Other Ambulatory Visit: Payer: Self-pay | Admitting: Family Medicine

## 2011-10-07 VITALS — BP 122/76 | HR 72 | Resp 18 | Ht 62.0 in | Wt 295.1 lb

## 2011-10-07 DIAGNOSIS — J329 Chronic sinusitis, unspecified: Secondary | ICD-10-CM | POA: Insufficient documentation

## 2011-10-07 DIAGNOSIS — I10 Essential (primary) hypertension: Secondary | ICD-10-CM

## 2011-10-07 DIAGNOSIS — R0989 Other specified symptoms and signs involving the circulatory and respiratory systems: Secondary | ICD-10-CM

## 2011-10-07 DIAGNOSIS — F411 Generalized anxiety disorder: Secondary | ICD-10-CM

## 2011-10-07 DIAGNOSIS — J019 Acute sinusitis, unspecified: Secondary | ICD-10-CM | POA: Insufficient documentation

## 2011-10-07 DIAGNOSIS — E669 Obesity, unspecified: Secondary | ICD-10-CM

## 2011-10-07 DIAGNOSIS — J320 Chronic maxillary sinusitis: Secondary | ICD-10-CM | POA: Insufficient documentation

## 2011-10-07 DIAGNOSIS — J0100 Acute maxillary sinusitis, unspecified: Secondary | ICD-10-CM

## 2011-10-07 LAB — COMPREHENSIVE METABOLIC PANEL
ALT: 52 U/L — ABNORMAL HIGH (ref 0–35)
Albumin: 3.6 g/dL (ref 3.5–5.2)
Alkaline Phosphatase: 82 U/L (ref 39–117)
CO2: 23 mEq/L (ref 19–32)
Glucose, Bld: 83 mg/dL (ref 70–99)
Potassium: 4.4 mEq/L (ref 3.5–5.3)
Sodium: 136 mEq/L (ref 135–145)
Total Bilirubin: 0.8 mg/dL (ref 0.3–1.2)
Total Protein: 8.4 g/dL — ABNORMAL HIGH (ref 6.0–8.3)

## 2011-10-07 LAB — HEMOGLOBIN A1C
Hgb A1c MFr Bld: 6 % — ABNORMAL HIGH (ref <5.7)
Mean Plasma Glucose: 126 mg/dL — ABNORMAL HIGH (ref <117)

## 2011-10-07 LAB — LIPID PANEL: LDL Cholesterol: 97 mg/dL (ref 0–99)

## 2011-10-07 MED ORDER — ALPRAZOLAM 0.25 MG PO TABS: 0.2500 mg | ORAL_TABLET | Freq: Every evening | ORAL | Status: DC | PRN

## 2011-10-07 MED ORDER — PENICILLIN V POTASSIUM 500 MG PO TABS
500.0000 mg | ORAL_TABLET | Freq: Three times a day (TID) | ORAL | Status: AC
Start: 2011-10-07 — End: 2011-10-17

## 2011-10-07 MED ORDER — FLUCONAZOLE 150 MG PO TABS: ORAL_TABLET | ORAL | Status: DC

## 2011-10-07 NOTE — Progress Notes (Signed)
  Subjective:    Patient ID: Holly Alvarado, female    DOB: 31-Jul-1966, 46 y.o.   MRN: [id number]  HPI  1 week h/o frontal and right periorbital pain, right ear pain, was treated with z pack for sinusitis and bronchitis in December. Concerned about weight , ready to make changes.  Several year h/o near syncopal events when going to sleep, denies chest pain or palpitations, on average one episode pr 4 to 6 weeks  Review of Systems See HPI Denies chest congestion, productive cough or wheezing. Denies chest pains, palpitations and leg swelling Denies abdominal pain, nausea, vomiting,diarrhea or constipation.   Denies dysuria, frequency, hesitancy or incontinence. Denies joint pain, swelling and limitation in mobility. Denies  seizures, numbness, or tingling. Denies uncontroled depression, anxiety or insomnia. Denies skin break down or rash.        Objective:   Physical Exam  Patient alert and oriented and in no cardiopulmonary distress.  HEENT: No facial asymmetry, EOMI, maxillary sinus tenderness,  oropharynx pink and moist.  Neck supple no adenopathy.  Chest: Clear to auscultation bilaterally.  CVS: S1, S2 no murmurs, no S3.  ABD: Soft non tender. Bowel sounds normal.  Ext: No edema  MS: Adequate ROM spine, shoulders, hips and knees.  Skin: Intact, no ulcerations or rash noted.  Psych: Good eye contact, normal affect. Memory intact not anxious or depressed appearing.  CNS: CN 2-12 intact, power, tone and sensation normal throughout.       Assessment & Plan:

## 2011-10-07 NOTE — Patient Instructions (Addendum)
F/u in 4 months  You are being treated for sinusitis.  You are referred for evaluation of the circulation in your neck arteries, you report repeated near passing out spells.  You will get a 1500 calorie diet sheet, you need to eat regularly to lose weight.  Removing sweet from your home is a GREAT idea, and NOT drinking sweet.  You will be referred to the nutritionist to help you along, she will call to sched the appt with you  "My fitness pal" is a wonderful application available free on the internet , to help with calorie caounting  Labs today, lipid, chem 7, HBA1C and Vit D, fasting  Weight loss goal of 3 to 4 pounds per month.  A healthy diet is rich in fruit, vegetables and whole grains. Poultry fish, nuts and beans are a healthy choice for protein rather then red meat. A low sodium diet and drinking 64 ounces of water daily is generally recommended. Oils and sweet should be limited. Carbohydrates especially for those who are diabetic or overweight, should be limited to 34-45 gram per meal. It is important to eat on a regular schedule, at least 3 times daily. Snacks should be primarily fruits, vegetables or nuts.   It is important that you exercise regularly at least 30 minutes 5 times a week. If you develop chest pain, have severe difficulty breathing, or feel very tired, stop exercising immediately and seek medical attention

## 2011-10-08 ENCOUNTER — Telehealth (HOSPITAL_COMMUNITY): Payer: Self-pay | Admitting: Dietician

## 2011-10-08 LAB — VITAMIN D 25 HYDROXY (VIT D DEFICIENCY, FRACTURES): Vit D, 25-Hydroxy: 23 ng/mL — ABNORMAL LOW (ref 30–89)

## 2011-10-08 NOTE — Telephone Encounter (Signed)
Received referral from Dr. Moshe Cipro for dx: obesity on 10/07/11.

## 2011-10-08 NOTE — Telephone Encounter (Signed)
Appointment scheduled for 10/13/11 at 8 AM.

## 2011-10-10 NOTE — Assessment & Plan Note (Signed)
Controlled, no change in medication  

## 2011-10-10 NOTE — Assessment & Plan Note (Signed)
Deteriorated. Patient re-educated about  the importance of commitment to a  minimum of 150 minutes of exercise per week. The importance of healthy food choices with portion control discussed. Encouraged to start a food diary, count calories and to consider  joining a support group. Sample diet sheets offered. Goals set by the patient for the next several months.    

## 2011-10-10 NOTE — Assessment & Plan Note (Signed)
Acute episode, antibiotic prescribed

## 2011-10-12 ENCOUNTER — Ambulatory Visit (HOSPITAL_COMMUNITY): Payer: BC Managed Care – PPO

## 2011-10-12 LAB — HEPATITIS PANEL, ACUTE
Hep A IgM: NEGATIVE
Hep B C IgM: NEGATIVE

## 2011-10-13 ENCOUNTER — Encounter (HOSPITAL_COMMUNITY): Payer: Self-pay | Admitting: Dietician

## 2011-10-13 NOTE — Progress Notes (Signed)
Outpatient Initial Nutrition Assessment  Date:10/13/2011   Time: 8:00 AM  Referring Physician: Dr. Moshe Cipro Reason for Visit: obesity  Nutrition Assessment:   Height: 5' 2"  (157.5 cm)   Weight: 287 lb (130.182 kg)   IBW: 110# %IBW: 261% UBW: 250# %UBW: 115% BMI: Body mass index is 52.49 kg/(m^2).  Goal Weight: 157# Weight hx: Pt reports hightest weight of 295#, which she was at her most recent doctor's visit. Her weight has steadily increased for the past 25 years (pt reports weight of 120# at age 46), due to weight from pregnancies.   Estimated nutritional needs: 2182-2380 kcals daily, 104-130 grams protein daily, 2.2-2.4 L fluid daily  PMH: Past Medical History  Diagnosis Date  . Allergic rhinitis, seasonal   . Anxiety disorder   . GERD (gastroesophageal reflux disease)   . Obesity   . Hypertension   . Chronic constipation   . Chronic diarrhea   . Hemorrhoids   . Esophageal polyp   . Fatty liver     Medications: Current Outpatient Rx  Name Route Sig Dispense Refill  . ALPRAZOLAM 0.25 MG PO TABS Oral Take 1 tablet (0.25 mg total) by mouth at bedtime as needed. 30 tablet 1  . ASPIRIN 81 MG PO TBEC Oral Take 81 mg by mouth daily. Take one tablet by mouth daily     . VITAMIN D3 50000 UNITS PO CAPS Oral Take 50,000 Units by mouth once a week. 12 capsule 1  . ESOMEPRAZOLE MAGNESIUM 40 MG PO CPDR Oral Take 1 capsule (40 mg total) by mouth daily. 30 capsule 3  . FLUCONAZOLE 150 MG PO TABS  One tablet once daily , as needed for vaginal itch 3 tablet 0  . METOPROLOL SUCCINATE ER 50 MG PO TB24  TAKE 1 TABLET BY MOUTH TWICE A DAY 60 tablet 3  . PENICILLIN V POTASSIUM 500 MG PO TABS Oral Take 1 tablet (500 mg total) by mouth 3 (three) times daily. 30 tablet 0  . VENLAFAXINE HCL ER 75 MG PO CP24  TAKE 1 CAPSULE BY MOUTH DAILY 30 capsule 3    Labs: CMP     Component Value Date/Time   NA 136 10/07/2011 0940   K 4.4 10/07/2011 0940   CL 104 10/07/2011 0940   CO2 23 10/07/2011 0940   GLUCOSE 83 10/07/2011 0940   BUN 9 10/07/2011 0940   CREATININE 0.61 10/07/2011 0940   CREATININE 0.72 08/06/2010 1152   CALCIUM 8.9 10/07/2011 0940   PROT 8.4* 10/07/2011 0940   ALBUMIN 3.6 10/07/2011 0940   AST 124* 10/07/2011 0940   ALT 52* 10/07/2011 0940   ALKPHOS 82 10/07/2011 0940   BILITOT 0.8 10/07/2011 0940   GFRNONAA >60 08/06/2010 1152   GFRAA  Value: >60        The eGFR has been calculated using the MDRD equation. This calculation has not been validated in all clinical situations. eGFR's persistently <60 mL/min signify possible Chronic Kidney Disease. 08/06/2010 1152     CBC    Component Value Date/Time   WBC 5.6 06/04/2011 0911   RBC 5.57* 06/04/2011 0911   HGB 13.0 06/04/2011 0911   HCT 41.3 06/04/2011 0911   PLT 220 06/04/2011 0911   MCV 74.1* 06/04/2011 0911   MCH 23.3* 06/04/2011 0911   MCHC 31.5 06/04/2011 0911   RDW 15.7* 06/04/2011 0911   LYMPHSABS 3.9 06/04/2011 0911   MONOABS 0.5 06/04/2011 0911   EOSABS 0.1 06/04/2011 0911   BASOSABS 0.0 06/04/2011 0911  Lipid Panel     Component Value Date/Time   CHOL 152 10/07/2011 0940   TRIG 106 10/07/2011 0940   HDL 34* 10/07/2011 0940   CHOLHDL 4.5 10/07/2011 0940   VLDL 21 10/07/2011 0940   LDLCALC 97 10/07/2011 0940     Lab Results  Component Value Date   HGBA1C 6.0* 10/07/2011   HGBA1C 5.9* 06/04/2011   HGBA1C 5.8* 11/18/2010   Lab Results  Component Value Date   LDLCALC 97 10/07/2011   CREATININE 0.61 10/07/2011    Nutrition hx/habits: Holly Alvarado is a very pleasant woman who desires weight loss. She lives in [place], [place] with her husband. 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 She has participated in exercise in the past (walking with husband and [employer]), but currently does not participate in exercise. Pt rates her stress level at a 6-7 on a 10 point scale, citing anxiety, weight, and health as her main stressors. She has stopped drinking sugary beverages and has stopped eating sweets. She  is confused about who she needs to do to lose weight; she is also interested in learning how to alter her lifestyle to a healthier one with her busy lifestyle. She has started drinking Slim Fast in the AM for breakfast, cut back on bread, and started eating lean cuisine and [place] Yogurt. She is concerned about the lack of variety in her diet. She has never tried any diets in the past (ex. Slim Fast, Weight Watchers, diet pills, etc.).    Diet recall: Breakfast: Slim Fast; Lunch: sandwich thin with peanut butter, greek yogurt; Dinner: [employer]. Beverages: Pt reports she has started back to drinking sweet tea.  Pt reports that she eats out "all the time", due to her busy schedule.   Nutrition Diagnosis: Involuntary weight gain r/t sedentary lifestyle, inappropriate food choices AEB diet high in sugary beverages, no physical activity, BMI: 52.49.   Nutrition Intervention: Nutrition rx: 1700 kcal NAS, no added sugar diet; 3 meals/ day (limit 1 starch per meal); low calorie beverages only; 2.5 hours physical activity per week  Education/Counseling Provided: Most of the visit was used for lifestyle counseling. Encourage small, moderate weight loss and short term vs. Long term goals. Encouraged 1-2# weight loss per week and length of time feasible to achieve goal of 157# (about 2-3 years). Also discussed benefits for small reductions of body weight (5-10%) for health. Discussed plate method and healthy food choices at restaurants (ex. Small portions, ordering baked potato, vegetable, or side salad instead of fries and sharing meal with friend or taking half home). Discussed importance of accountability; showed pt functionality of MyFitnessPal. Discussed ways to incorporate exercise into lifestyle (working out before or after work). Also discussed strategies of making best use of time to prepare meals (ex. Make extra portions to use during the week when busy, utilizing crockpot, adding salads or raw vegetables  to lunch).  Provided plate method and 8299 calorie sample diet from [employer].   Understanding, Motivation, Ability to Follow Recommendations: Expect fair to good compliance.  Monitoring and Evaluation: Goals: 1) 1-2# weight loss per week; 2) 2.5 hours physical activity daily  Recommendations: 1) For weight loss: 3716-9678 kcals daily; 2) Prepare extra portions of meals on days that you don't work evenings to prevent eating out; 3) Keep food diary (ex. My Fitness Pal); 4) Find an enjoyable exercise  F/U: 2 months  Alene Mires, [place]  10/13/2011  Time:  8:00 AM

## 2011-11-15 ENCOUNTER — Telehealth: Payer: Self-pay | Admitting: Family Medicine

## 2011-11-15 NOTE — Telephone Encounter (Signed)
Records state all 3 of these meds were sent in. Pt needs to come back in for appt if still having problems over a month later

## 2011-11-16 ENCOUNTER — Telehealth: Payer: Self-pay

## 2011-11-16 ENCOUNTER — Encounter: Payer: Self-pay | Admitting: Family Medicine

## 2011-11-16 ENCOUNTER — Ambulatory Visit (INDEPENDENT_AMBULATORY_CARE_PROVIDER_SITE_OTHER): Payer: BC Managed Care – PPO | Admitting: Family Medicine

## 2011-11-16 VITALS — BP 110/70 | HR 90 | Resp 18 | Ht 62.0 in | Wt 297.0 lb

## 2011-11-16 DIAGNOSIS — R7309 Other abnormal glucose: Secondary | ICD-10-CM

## 2011-11-16 DIAGNOSIS — F411 Generalized anxiety disorder: Secondary | ICD-10-CM

## 2011-11-16 DIAGNOSIS — J209 Acute bronchitis, unspecified: Secondary | ICD-10-CM

## 2011-11-16 DIAGNOSIS — R7303 Prediabetes: Secondary | ICD-10-CM

## 2011-11-16 DIAGNOSIS — E669 Obesity, unspecified: Secondary | ICD-10-CM

## 2011-11-16 DIAGNOSIS — I10 Essential (primary) hypertension: Secondary | ICD-10-CM

## 2011-11-16 DIAGNOSIS — E559 Vitamin D deficiency, unspecified: Secondary | ICD-10-CM

## 2011-11-16 DIAGNOSIS — J329 Chronic sinusitis, unspecified: Secondary | ICD-10-CM

## 2011-11-16 MED ORDER — BENZONATATE 100 MG PO CAPS: 100.0000 mg | ORAL_CAPSULE | Freq: Four times a day (QID) | ORAL | Status: DC | PRN

## 2011-11-16 MED ORDER — ALPRAZOLAM 0.25 MG PO TABS: 0.2500 mg | ORAL_TABLET | Freq: Every evening | ORAL | Status: DC | PRN

## 2011-11-16 MED ORDER — PENICILLIN V POTASSIUM 500 MG PO TABS: 500.0000 mg | ORAL_TABLET | Freq: Three times a day (TID) | ORAL | Status: AC

## 2011-11-16 MED ORDER — FLUCONAZOLE 150 MG PO TABS: ORAL_TABLET | ORAL | Status: AC

## 2011-11-16 MED ORDER — PENICILLIN V POTASSIUM 500 MG PO TABS: 500.0000 mg | ORAL_TABLET | Freq: Three times a day (TID) | ORAL | Status: DC

## 2011-11-16 MED ORDER — FLUCONAZOLE 150 MG PO TABS: ORAL_TABLET | ORAL | Status: DC

## 2011-11-16 NOTE — Progress Notes (Signed)
  Subjective:    Patient ID: Holly Alvarado, female    DOB: [birth year], 46 y.o.   MRN: [id number]  HPI 1 week h/o cough and chest congestion, thick , yellow and bloody intermittent chills, no documented fever. Head congestion since January, doid not ever get the penicillin prescribed at [facility] t visit. She has started seeing the nutrtionist for help with diet and weight loss, unfortunately she has gained weight since last visit. Efforts are inconsistent and she does not exercise. She has been concerned about her BP today she was told it was high   Review of Systems See HPI Denies chest pains, palpitations and leg swelling Denies abdominal pain, nausea, vomiting,diarrhea or constipation.   Denies dysuria, frequency, hesitancy or incontinence. Denies joint pain, swelling and limitation in mobility. Denies headaches, seizures, numbness, or tingling. Denies depression, uncontrolled anxiety or insomnia. Denies skin break down or rash.        Objective:   Physical Exam  Patient alert and oriented and in no cardiopulmonary distress.  HEENT: No facial asymmetry, EOMI, maxillary  sinus tenderness,  oropharynx pink and moist.  Neck supple, anterior cervical adenopathy.Bruit  Chest: decreased air entry, scattered crackles, no wheezes  CVS: S1, S2 no murmurs, no S3.  ABD: Soft non tender. Bowel sounds normal.  Ext: No edema  MS: Adequate ROM spine, shoulders, hips and knees.  Skin: Intact, no ulcerations or rash noted.  Psych: Good eye contact, normal affect. Memory intact not anxious or depressed appearing.  CNS: CN 2-12 intact, power, tone and sensation normal throughout.       Assessment & Plan:

## 2011-11-16 NOTE — Patient Instructions (Signed)
F/u in 4 month  Medication will be handed to you for acute bronchitis and sinusitis.  Blood pressure is normal.  Work harder and more consistently on food choices , and eat regularly please  HBa1C and vit D in 4 months.  You need to reschedule your carotid study, if you have problems call the office and speak to referral staff please

## 2011-11-16 NOTE — Telephone Encounter (Signed)
States her blood pressure was very high this morning when [facility]. States it was 160/110. Has never been that high. She has an appt at 1:30 today and I told her if she felt like she needed to be seen at the ER to go on but she stated she was not feeling any different, no dizziness or headache so I advised to definitely keep her appt for today.

## 2011-11-21 NOTE — Assessment & Plan Note (Signed)
Controlled, no change in medication  

## 2011-11-21 NOTE — Assessment & Plan Note (Signed)
Deteriorated. Patient re-educated about  the importance of commitment to a  minimum of 150 minutes of exercise per week. The importance of healthy food choices with portion control discussed. Encouraged to start a food diary, count calories and to consider  joining a support group. Sample diet sheets offered. Goals set by the patient for the next several months.    

## 2011-11-21 NOTE — Assessment & Plan Note (Signed)
Antibiotics and decongestants prescribed

## 2011-11-21 NOTE — Assessment & Plan Note (Signed)
Antibiotic course prescribed

## 2011-11-23 ENCOUNTER — Other Ambulatory Visit: Payer: Self-pay | Admitting: Family Medicine

## 2011-12-01 ENCOUNTER — Telehealth (HOSPITAL_COMMUNITY): Payer: Self-pay | Admitting: Dietician

## 2011-12-01 NOTE — Telephone Encounter (Signed)
Pt was a no-show for follow-up appointment scheduled for 12/01/11 at 8:00 AM. Sent letter to pt home notifying pt of no-show and requesting rescheduling appointment.

## 2011-12-13 ENCOUNTER — Other Ambulatory Visit: Payer: Self-pay | Admitting: Family Medicine

## 2012-02-07 ENCOUNTER — Ambulatory Visit: Payer: BC Managed Care – PPO | Admitting: Family Medicine

## 2012-02-26 ENCOUNTER — Other Ambulatory Visit: Payer: Self-pay | Admitting: Family Medicine

## 2012-03-20 ENCOUNTER — Ambulatory Visit: Payer: BC Managed Care – PPO | Admitting: Family Medicine

## 2012-05-11 ENCOUNTER — Ambulatory Visit (INDEPENDENT_AMBULATORY_CARE_PROVIDER_SITE_OTHER): Payer: BC Managed Care – PPO | Admitting: Family Medicine

## 2012-05-11 ENCOUNTER — Encounter: Payer: Self-pay | Admitting: Family Medicine

## 2012-05-11 VITALS — BP 124/78 | HR 81 | Resp 18 | Ht 62.0 in | Wt 293.0 lb

## 2012-05-11 DIAGNOSIS — N3090 Cystitis, unspecified without hematuria: Secondary | ICD-10-CM

## 2012-05-11 DIAGNOSIS — D649 Anemia, unspecified: Secondary | ICD-10-CM

## 2012-05-11 LAB — POCT URINALYSIS DIPSTICK
Leukocytes, UA: NEGATIVE
Nitrite, UA: POSITIVE
Protein, UA: 30
Urobilinogen, UA: 1
pH, UA: 6

## 2012-05-11 MED ORDER — FLUCONAZOLE 150 MG PO TABS: 150.0000 mg | ORAL_TABLET | Freq: Once | ORAL | Status: AC

## 2012-05-11 MED ORDER — CIPROFLOXACIN HCL 500 MG PO TABS: 500.0000 mg | ORAL_TABLET | Freq: Two times a day (BID) | ORAL | Status: AC

## 2012-05-11 NOTE — Patient Instructions (Addendum)
Start the antibiotics for urine infection Diflucan for yeast infection  Continue water intake  Try the Azo

## 2012-05-11 NOTE — Progress Notes (Signed)
  Subjective:    Patient ID: Holly Alvarado, female    DOB: 1966-01-31, 46 y.o.   MRN: [id number]  HPI Patient here with UTI symptoms x1 month. She's had urinary frequency is as well as very foul odor to the urine. Mild dysuria. She has some right flank discomfort as well. She denies fever admits to mild nausea no emesis. She's been drinking a lot of water and cranberry juice however her urine smell and cloudiness is getting stronger   Review of Systems - per above  GEN- denies fatigue, fever, weight loss,weakness, recent illness HEENT- denies eye drainage, change in vision, nasal discharge, CVS- denies chest pain, palpitations RESP- denies SOB, cough, wheeze ABD- +N/V, change in stools, abd pain MSK- denies joint pain, muscle aches, injury Neuro- denies headache, dizziness, syncope, seizure activity       Objective:   Physical Exam GEN- NAD, alert and oriented x3, obese  HEENT- PERRL, EOMI, non injected sclera, pink conjunctiva, MMM, oropharynx clear Neck- Supple, no thryomegaly CVS- RRR, no murmur RESP-CTAB ABD-NABS,soft,mild TTP Right side, no rebound no gaurding, Non tender in RUQ, ,ND, no CVA tenderness  EXT- No edema Pulses- Radial, DP- 2+        Assessment & Plan:    UTI- start antibiotics, urine sent for culture. Increase fluid intake.Azo  Anemia-history of anemia, pt asking for repeat levels today, no active bleeding

## 2012-05-13 ENCOUNTER — Other Ambulatory Visit: Payer: Self-pay | Admitting: Family Medicine

## 2012-05-13 LAB — URINE CULTURE: Colony Count: >=100000

## 2012-05-25 ENCOUNTER — Ambulatory Visit: Payer: BC Managed Care – PPO | Admitting: Family Medicine

## 2012-05-25 ENCOUNTER — Encounter: Payer: Self-pay | Admitting: Family Medicine

## 2012-07-21 ENCOUNTER — Other Ambulatory Visit: Payer: Self-pay | Admitting: Family Medicine

## 2012-07-25 ENCOUNTER — Telehealth: Payer: Self-pay | Admitting: Family Medicine

## 2012-07-25 ENCOUNTER — Other Ambulatory Visit: Payer: Self-pay | Admitting: Family Medicine

## 2012-07-25 ENCOUNTER — Ambulatory Visit (INDEPENDENT_AMBULATORY_CARE_PROVIDER_SITE_OTHER): Payer: BC Managed Care – PPO | Admitting: Family Medicine

## 2012-07-25 ENCOUNTER — Encounter: Payer: Self-pay | Admitting: Family Medicine

## 2012-07-25 VITALS — BP 118/80 | HR 74 | Resp 15 | Ht 62.0 in | Wt 298.0 lb

## 2012-07-25 DIAGNOSIS — I10 Essential (primary) hypertension: Secondary | ICD-10-CM

## 2012-07-25 DIAGNOSIS — Z23 Encounter for immunization: Secondary | ICD-10-CM

## 2012-07-25 DIAGNOSIS — F411 Generalized anxiety disorder: Secondary | ICD-10-CM

## 2012-07-25 DIAGNOSIS — R600 Localized edema: Secondary | ICD-10-CM | POA: Insufficient documentation

## 2012-07-25 DIAGNOSIS — E669 Obesity, unspecified: Secondary | ICD-10-CM

## 2012-07-25 DIAGNOSIS — M2550 Pain in unspecified joint: Secondary | ICD-10-CM | POA: Insufficient documentation

## 2012-07-25 DIAGNOSIS — Z1239 Encounter for other screening for malignant neoplasm of breast: Secondary | ICD-10-CM

## 2012-07-25 DIAGNOSIS — R609 Edema, unspecified: Secondary | ICD-10-CM

## 2012-07-25 LAB — CBC
MCH: 23.7 pg — ABNORMAL LOW (ref 26.0–34.0)
MCV: 72.6 fL — ABNORMAL LOW (ref 78.0–100.0)
Platelets: 171 10*3/uL (ref 150–400)
RBC: 5.44 MIL/uL — ABNORMAL HIGH (ref 3.87–5.11)

## 2012-07-25 LAB — IRON AND TIBC: %SAT: 61 % — ABNORMAL HIGH (ref 20–55)

## 2012-07-25 MED ORDER — VENLAFAXINE HCL ER 75 MG PO CP24: 75.0000 mg | ORAL_CAPSULE | Freq: Every day | ORAL | Status: DC

## 2012-07-25 MED ORDER — METOPROLOL SUCCINATE ER 50 MG PO TB24: ORAL_TABLET | ORAL | Status: DC

## 2012-07-25 MED ORDER — ALPRAZOLAM 0.25 MG PO TABS: 0.2500 mg | ORAL_TABLET | Freq: Every evening | ORAL | Status: DC | PRN

## 2012-07-25 NOTE — Telephone Encounter (Signed)
Noted  

## 2012-07-25 NOTE — Assessment & Plan Note (Signed)
Doing well, meds refilled

## 2012-07-25 NOTE — Progress Notes (Signed)
  Subjective:    Patient ID: Holly Alvarado, female    DOB: 1966-03-26, 46 y.o.   MRN: [id number]  HPI Pt here to f/u chronic medical problems, no specific concerns. Labs done this AM. Works 2 jobs teaching, does not exercise and often eats a lot of fast food. Has had a few episodes of joint pain all over, feels hands swell at times. Due for CPE  Review of Systems  GEN- denies fatigue, fever, weight loss,weakness, recent illness HEENT- denies eye drainage, change in vision, nasal discharge, CVS- denies chest pain, palpitations RESP- denies SOB, cough, wheeze ABD- denies N/V, change in stools, abd pain GU- denies dysuria, hematuria, dribbling, incontinence MSK- + joint pain, muscle aches, injury Neuro- denies headache, dizziness, syncope, seizure activity      Objective:   Physical Exam GEN- NAD, alert and oriented x3, obese HEENT- PERRL, EOMI, non injected sclera, pink conjunctiva, MMM, oropharynx clear Neck- Supple, no thyromegaly CVS- RRR, no murmur RESP-CTAB ABS-NABS,soft,NT,ND EXT- + pedal edema, no joint swelling in hands, knes Pulses- Radial, DP- 2+ Psych-normal affect and mood       Assessment & Plan:

## 2012-07-25 NOTE — Assessment & Plan Note (Signed)
Recurrent generalized joint pain, check ESR, CRP, RF, continue prn Aleve

## 2012-07-25 NOTE — Assessment & Plan Note (Signed)
Well controlled, no change to meds 

## 2012-07-25 NOTE — Assessment & Plan Note (Signed)
Pedal edema, she is getting venous pooling from standing also secondary to obesity, discussed compression hose OTC

## 2012-07-25 NOTE — Patient Instructions (Addendum)
We will call with results of lab work Inflammatory markers will be checked Work on exercise, packing lunch, avoiding fast food Medications refilled Compression hose over the counter  Flu shot given Schedule your mammogram Schedule a physical in 4 months

## 2012-07-25 NOTE — Assessment & Plan Note (Signed)
Discussed importance of diet and exercise Short term goals set

## 2012-07-26 LAB — VITAMIN D 25 HYDROXY (VIT D DEFICIENCY, FRACTURES): Vit D, 25-Hydroxy: 15 ng/mL — ABNORMAL LOW (ref 30–89)

## 2012-08-03 ENCOUNTER — Other Ambulatory Visit: Payer: Self-pay | Admitting: Family Medicine

## 2012-08-07 ENCOUNTER — Ambulatory Visit (INDEPENDENT_AMBULATORY_CARE_PROVIDER_SITE_OTHER): Payer: BC Managed Care – PPO | Admitting: Family Medicine

## 2012-08-07 ENCOUNTER — Encounter: Payer: Self-pay | Admitting: Family Medicine

## 2012-08-07 VITALS — BP 122/78 | HR 82 | Resp 18 | Ht 62.0 in | Wt 299.0 lb

## 2012-08-07 DIAGNOSIS — F418 Other specified anxiety disorders: Secondary | ICD-10-CM | POA: Insufficient documentation

## 2012-08-07 DIAGNOSIS — E669 Obesity, unspecified: Secondary | ICD-10-CM

## 2012-08-07 DIAGNOSIS — I10 Essential (primary) hypertension: Secondary | ICD-10-CM

## 2012-08-07 DIAGNOSIS — F411 Generalized anxiety disorder: Secondary | ICD-10-CM

## 2012-08-07 DIAGNOSIS — F329 Major depressive disorder, single episode, unspecified: Secondary | ICD-10-CM

## 2012-08-07 DIAGNOSIS — R7301 Impaired fasting glucose: Secondary | ICD-10-CM

## 2012-08-07 NOTE — Patient Instructions (Addendum)
F/u in 4 month  You have gained weight also your blood sugar average has increased   Start a regular exercise routine and stop junk eating   YOu are referred for therapy.  Continue to look into the [facility] weight loss program  HBA1C  In 20month

## 2012-08-07 NOTE — Progress Notes (Signed)
  Subjective:    Patient ID: Holly Alvarado, female    DOB: [birth year], 45 y.o.   MRN: [id number]  HPI The PT is here for follow up and re-evaluation of chronic medical conditions, medication management and review of any available recent lab and radiology data.  Preventive health is updated, specifically  Cancer screening and Immunization.   Questions or concerns regarding consultations or procedures which the PT has had in the interim are  addressed. The PT denies any adverse reactions to current medications since the last visit.  States she is mentally decompensating due to poor marriage, ready for professional help with counseling.Not suicidal or homicidal, not hallucinating Weight and entire life is out of control, looking into bypass      Review of Systems See HPI Denies recent fever or chills. Denies sinus pressure, nasal congestion, ear pain or sore throat. Denies chest congestion, productive cough or wheezing. Denies chest pains, palpitations and leg swelling Denies abdominal pain, nausea, vomiting,diarrhea or constipation.   Denies dysuria, frequency, hesitancy or incontinence. Denies joint pain, swelling and limitation in mobility. Denies headaches, seizures, numbness, or tingling.  Denies skin break down or rash.        Objective:   Physical Exam  Patient alert and oriented and in no cardiopulmonary distress.  HEENT: No facial asymmetry, EOMI, no sinus tenderness,  oropharynx pink and moist.  Neck supple no adenopathy.  Chest: Clear to auscultation bilaterally.  CVS: S1, S2 no murmurs, no S3.  ABD: Soft non tender. Bowel sounds normal.  Ext: No edema  MS: Adequate ROM spine, shoulders, hips and knees.  Skin: Intact, no ulcerations or rash noted.  Psych: Good eye contact, normal affect. Memory intact anxious , tearful and  depressed appearing.  CNS: CN 2-12 intact, power, tone and sensation normal throughout.       Assessment & Plan:

## 2012-08-08 ENCOUNTER — Telehealth: Payer: Self-pay

## 2012-08-08 DIAGNOSIS — M2550 Pain in unspecified joint: Secondary | ICD-10-CM

## 2012-08-08 NOTE — Telephone Encounter (Signed)
Will let pt know to go have labs drawn

## 2012-08-09 NOTE — Telephone Encounter (Signed)
Called patient and left message for them to return call at the office   

## 2012-08-18 NOTE — Assessment & Plan Note (Signed)
Deteriorated. Patient re-educated about  the importance of commitment to a  minimum of 150 minutes of exercise per week. The importance of healthy food choices with portion control discussed. Encouraged to start a food diary, count calories and to consider  joining a support group. Sample diet sheets offered. Goals set by the patient for the next several months.    

## 2012-08-18 NOTE — Assessment & Plan Note (Addendum)
Uncontrolled, and interfering with ability to function, not suicidal or homicidal, therapy needed and accepts at this time. Continue current meds

## 2012-08-18 NOTE — Assessment & Plan Note (Signed)
Uncontrolled anxiety and depression, will work on marital situation to improve this, couples therapy recommended

## 2012-08-18 NOTE — Assessment & Plan Note (Signed)
Controlled, no change in medication DASH diet and commitment to daily physical activity for a minimum of 30 minutes discussed and encouraged, as a part of hypertension management. The importance of attaining a healthy weight is also discussed.  

## 2012-10-17 ENCOUNTER — Encounter: Payer: Self-pay | Admitting: Family Medicine

## 2012-10-17 ENCOUNTER — Ambulatory Visit (INDEPENDENT_AMBULATORY_CARE_PROVIDER_SITE_OTHER): Payer: BC Managed Care – PPO | Admitting: Family Medicine

## 2012-10-17 VITALS — BP 122/88 | HR 97 | Temp 98.6°F | Resp 18 | Ht 62.0 in | Wt 295.0 lb

## 2012-10-17 DIAGNOSIS — J0100 Acute maxillary sinusitis, unspecified: Secondary | ICD-10-CM

## 2012-10-17 DIAGNOSIS — H6690 Otitis media, unspecified, unspecified ear: Secondary | ICD-10-CM

## 2012-10-17 DIAGNOSIS — F411 Generalized anxiety disorder: Secondary | ICD-10-CM

## 2012-10-17 DIAGNOSIS — E669 Obesity, unspecified: Secondary | ICD-10-CM

## 2012-10-17 DIAGNOSIS — I10 Essential (primary) hypertension: Secondary | ICD-10-CM

## 2012-10-17 DIAGNOSIS — J209 Acute bronchitis, unspecified: Secondary | ICD-10-CM | POA: Insufficient documentation

## 2012-10-17 DIAGNOSIS — M2550 Pain in unspecified joint: Secondary | ICD-10-CM

## 2012-10-17 DIAGNOSIS — H6692 Otitis media, unspecified, left ear: Secondary | ICD-10-CM

## 2012-10-17 DIAGNOSIS — Z131 Encounter for screening for diabetes mellitus: Secondary | ICD-10-CM

## 2012-10-17 MED ORDER — PENICILLIN V POTASSIUM 500 MG PO TABS: 500.0000 mg | ORAL_TABLET | Freq: Three times a day (TID) | ORAL | Status: DC

## 2012-10-17 MED ORDER — BENZONATATE 100 MG PO CAPS: 100.0000 mg | ORAL_CAPSULE | Freq: Four times a day (QID) | ORAL | Status: DC | PRN

## 2012-10-17 MED ORDER — FLUCONAZOLE 150 MG PO TABS: ORAL_TABLET | ORAL | Status: AC

## 2012-10-17 NOTE — Patient Instructions (Addendum)
F/u end February, please cancel March   You are treated today for acute left maxillary sinusitis, left otitis media and acute bronchitis.  Please take entire course of medications prescribed.  Congrats on change in eating, keep this up.  Please follow through on referral , this will help you, and you are already much improved.  Labs before Feb visit, HBA1C, TSh, VitD, ESR, Rheumatoid factor

## 2012-11-05 DIAGNOSIS — H6692 Otitis media, unspecified, left ear: Secondary | ICD-10-CM | POA: Insufficient documentation

## 2012-11-05 NOTE — Assessment & Plan Note (Signed)
Improved. Pt applauded on succesful weight loss through lifestyle change, and encouraged to continue same. Weight loss goal set for the next several months.  

## 2012-11-05 NOTE — Assessment & Plan Note (Signed)
Sub optimal , though adequate control DASH diet and commitment to daily physical activity for a minimum of 30 minutes discussed and encouraged, as a part of hypertension management. The importance of attaining a healthy weight is also discussed.

## 2012-11-05 NOTE — Assessment & Plan Note (Signed)
Improved, continue current meds

## 2012-11-05 NOTE — Assessment & Plan Note (Signed)
Antibiotics prescribed

## 2012-11-05 NOTE — Assessment & Plan Note (Signed)
Antibiotic and decongestant prescribed 

## 2012-11-05 NOTE — Assessment & Plan Note (Signed)
antibioitc prescribed

## 2012-11-05 NOTE — Progress Notes (Signed)
  Subjective:    Patient ID: Holly Alvarado, female    DOB: 04-14-1966, 47 y.o.   MRN: [id number]  HPI 1 week h/o progressive left facial pain with green nasal drainage and cough productive of green sputum, she has also had intermittent fever and chills.c/o left ear pain and fullness x 5 days Has been successful with weight loss due to change in mental approach to the problem, more control over her life being sought   Review of Systems See HPI  Denies chest pains, palpitations and leg swelling Denies abdominal pain, nausea, vomiting,diarrhea or constipation.   Denies dysuria, frequency, hesitancy or incontinence. Denies joint pain, swelling and limitation in mobility. Denies headaches, seizures, numbness, or tingling. Denies uncontrolled depression, anxiety or insomnia. Denies skin break down or rash.         Objective:   Physical Exam Patient alert and oriented and in no cardiopulmonary distress.  HEENT: No facial asymmetry, EOMI, left maxillary sinus tenderness,  oropharynx pink and moist.  Neck supple left anterior cervical adenopathy.Left TM erythematous with dull light reflex, right tM clear  Chest: adequate air entry, scattered crackles, no wheezes  CVS: S1, S2 no murmurs, no S3.  ABD: Soft non tender. Bowel sounds normal.  Ext: No edema  MS: Adequate ROM spine, shoulders, hips and knees.  Skin: Intact, no ulcerations or rash noted.  Psych: Good eye contact, normal affect. Memory intact not anxious or depressed appearing.  CNS: CN 2-12 intact, power, tone and sensation normal throughout.        Assessment & Plan:

## 2012-11-22 ENCOUNTER — Ambulatory Visit: Payer: BC Managed Care – PPO | Admitting: Family Medicine

## 2012-12-05 ENCOUNTER — Ambulatory Visit: Payer: BC Managed Care – PPO | Admitting: Family Medicine

## 2012-12-14 ENCOUNTER — Ambulatory Visit: Payer: BC Managed Care – PPO | Admitting: Family Medicine

## 2012-12-26 ENCOUNTER — Ambulatory Visit: Payer: BC Managed Care – PPO | Admitting: Family Medicine

## 2012-12-27 ENCOUNTER — Other Ambulatory Visit: Payer: Self-pay | Admitting: Family Medicine

## 2012-12-28 LAB — RHEUMATOID FACTOR: Rhuematoid fact SerPl-aCnc: <10 IU/mL (ref <=14)

## 2012-12-28 LAB — TSH: TSH: 0.806 u[IU]/mL (ref 0.350–4.500)

## 2012-12-28 LAB — HEMOGLOBIN A1C: Mean Plasma Glucose: 128 mg/dL — ABNORMAL HIGH (ref <117)

## 2012-12-29 LAB — VITAMIN D 25 HYDROXY (VIT D DEFICIENCY, FRACTURES): Vit D, 25-Hydroxy: 18 ng/mL — ABNORMAL LOW (ref 30–89)

## 2012-12-29 LAB — SEDIMENTATION RATE: Sed Rate: 14 mm/hr (ref 0–22)

## 2013-01-01 ENCOUNTER — Ambulatory Visit (INDEPENDENT_AMBULATORY_CARE_PROVIDER_SITE_OTHER): Payer: BC Managed Care – PPO | Admitting: Family Medicine

## 2013-01-01 ENCOUNTER — Encounter: Payer: Self-pay | Admitting: Family Medicine

## 2013-01-01 VITALS — BP 124/78 | HR 75 | Resp 16 | Wt 302.8 lb

## 2013-01-01 DIAGNOSIS — K219 Gastro-esophageal reflux disease without esophagitis: Secondary | ICD-10-CM

## 2013-01-01 DIAGNOSIS — F411 Generalized anxiety disorder: Secondary | ICD-10-CM

## 2013-01-01 DIAGNOSIS — I10 Essential (primary) hypertension: Secondary | ICD-10-CM

## 2013-01-01 DIAGNOSIS — M25579 Pain in unspecified ankle and joints of unspecified foot: Secondary | ICD-10-CM

## 2013-01-01 DIAGNOSIS — E669 Obesity, unspecified: Secondary | ICD-10-CM

## 2013-01-01 DIAGNOSIS — M25572 Pain in left ankle and joints of left foot: Secondary | ICD-10-CM

## 2013-01-01 DIAGNOSIS — F329 Major depressive disorder, single episode, unspecified: Secondary | ICD-10-CM

## 2013-01-01 DIAGNOSIS — J301 Allergic rhinitis due to pollen: Secondary | ICD-10-CM

## 2013-01-01 MED ORDER — ALPRAZOLAM 0.25 MG PO TABS: 0.2500 mg | ORAL_TABLET | Freq: Every evening | ORAL | Status: DC | PRN

## 2013-01-01 MED ORDER — METOPROLOL SUCCINATE ER 50 MG PO TB24: ORAL_TABLET | ORAL | Status: DC

## 2013-01-01 MED ORDER — ESOMEPRAZOLE MAGNESIUM 40 MG PO CPDR: 40.0000 mg | DELAYED_RELEASE_CAPSULE | Freq: Every day | ORAL | Status: DC

## 2013-01-01 MED ORDER — FLUTICASONE PROPIONATE 50 MCG/ACT NA SUSP: 2.0000 | Freq: Every day | NASAL | Status: DC

## 2013-01-01 MED ORDER — LORATADINE 10 MG PO TABS: 10.0000 mg | ORAL_TABLET | Freq: Every day | ORAL | Status: DC

## 2013-01-01 NOTE — Patient Instructions (Signed)
F/u in 4.5 month  You are referred for evaluation for bariatric surgery, please ask all the questions you need to of the surgeon , and keep options open for where you want the procedure. I strongly believe that you will benefit greatly from the procedure as far as your over all health and weight management is concerned   Blood sugar averages have improved, congrats, so keep following a low carb diet , also follow low fat diet   We will follow up on the left foot pain and swelling at the next visit

## 2013-01-01 NOTE — Progress Notes (Signed)
  Subjective:    Patient ID: Francina Ames, female    DOB: [birth year], 47 y.o.   MRN: [id number]  HPI The PT is here for follow up and re-evaluation of chronic medical conditions, medication management and review of any available recent lab and radiology data.  Preventive health is updated, specifically  Cancer screening and Immunization.   Questions or concerns regarding consultations or procedures which the PT has had in the interim are  addressed. The PT denies any adverse reactions to current medications since the last visit.  Has reached the point of desiring surgical intervention for morbid obesity which is potentially becoming disabling. States even dressing herself is a challenge. She has tried lifestyle modification for years with ever increasing weight  This is also a source of depression and poor self esteem, and retards her ability to get around as she wishes Still has left ankle pain and swelling feels foot is tuirniong outward, can't wear heels, wants to wait on further eval of this      Review of Systems See HPI Denies recent fever or chills. Denies sinus pressure, , ear pain or sore throat. Denies chest congestion, productive cough or wheezing. Denies chest pains, palpitations and leg swelling Denies abdominal pain, nausea, vomiting,diarrhea or constipation.   Denies dysuria, frequency, hesitancy or incontinence.  Denies headaches, seizures, numbness, or tingling. Denies uncontrolled  depression, anxiety or insomnia. Denies skin break down or rash.        Objective:   Physical Exam  Patient alert and oriented and in no cardiopulmonary distress.  HEENT: No facial asymmetry, EOMI, no sinus tenderness,  oropharynx pink and moist.  Neck supple no adenopathy.erythema and edema of nasal mucosa  Chest: Clear to auscultation bilaterally.  CVS: S1, S2 no murmurs, no S3.  ABD: Soft non tender. Bowel sounds normal.  Ext: No edema  MS: Adequate ROM spine,  shoulders, hips and knees.  Skin: Intact, no ulcerations or rash noted.  Psych: Good eye contact, normal affect. Memory intact not anxious or depressed appearing.  CNS: CN 2-12 intact, power, tone and sensation normal throughout.       Assessment & Plan:

## 2013-01-02 NOTE — Assessment & Plan Note (Signed)
Primarily relates to morbid obesity and inability to lose weight and increasing debility, pt ready  To take the definitive step of bariatric surgery, she is an excellent candidate and needs this

## 2013-01-02 NOTE — Assessment & Plan Note (Signed)
Increased symptoms in Spring mesd sent in in anticipation

## 2013-01-02 NOTE — Assessment & Plan Note (Signed)
Controlled, no change in medication  

## 2013-01-02 NOTE — Assessment & Plan Note (Signed)
Deteriorated. Patient re-educated about  the importance of commitment to a  minimum of 150 minutes of exercise per week. The importance of healthy food choices with portion control discussed. Encouraged to start a food diary, count calories and to consider  joining a support group. Sample diet sheets offered. Goals set by the patient for the next several months.    

## 2013-01-02 NOTE — Assessment & Plan Note (Signed)
Controlled, no change in medication DASH diet and commitment to daily physical activity for a minimum of 30 minutes discussed and encouraged, as a part of hypertension management. The importance of attaining a healthy weight is also discussed.  

## 2013-01-02 NOTE — Assessment & Plan Note (Signed)
omgoing pain with foot deformity, past h/o sprain approx 2 years ago, wants to hold on follow up of this

## 2013-05-08 ENCOUNTER — Ambulatory Visit: Payer: BC Managed Care – PPO | Admitting: Family Medicine

## 2013-05-10 ENCOUNTER — Other Ambulatory Visit: Payer: Self-pay | Admitting: Family Medicine

## 2013-05-10 ENCOUNTER — Telehealth: Payer: Self-pay

## 2013-05-10 ENCOUNTER — Ambulatory Visit (INDEPENDENT_AMBULATORY_CARE_PROVIDER_SITE_OTHER): Payer: BC Managed Care – PPO | Admitting: Family Medicine

## 2013-05-10 ENCOUNTER — Encounter: Payer: Self-pay | Admitting: Family Medicine

## 2013-05-10 ENCOUNTER — Telehealth (HOSPITAL_COMMUNITY): Payer: Self-pay | Admitting: Dietician

## 2013-05-10 VITALS — BP 120/78 | HR 81 | Resp 16 | Wt 306.0 lb

## 2013-05-10 DIAGNOSIS — J301 Allergic rhinitis due to pollen: Secondary | ICD-10-CM

## 2013-05-10 DIAGNOSIS — Z13220 Encounter for screening for lipoid disorders: Secondary | ICD-10-CM

## 2013-05-10 DIAGNOSIS — I10 Essential (primary) hypertension: Secondary | ICD-10-CM

## 2013-05-10 DIAGNOSIS — R7303 Prediabetes: Secondary | ICD-10-CM

## 2013-05-10 DIAGNOSIS — M25572 Pain in left ankle and joints of left foot: Secondary | ICD-10-CM

## 2013-05-10 DIAGNOSIS — M25579 Pain in unspecified ankle and joints of unspecified foot: Secondary | ICD-10-CM

## 2013-05-10 DIAGNOSIS — Z6841 Body Mass Index (BMI) 40.0 and over, adult: Secondary | ICD-10-CM

## 2013-05-10 DIAGNOSIS — E559 Vitamin D deficiency, unspecified: Secondary | ICD-10-CM

## 2013-05-10 DIAGNOSIS — M79609 Pain in unspecified limb: Secondary | ICD-10-CM

## 2013-05-10 DIAGNOSIS — R7989 Other specified abnormal findings of blood chemistry: Secondary | ICD-10-CM

## 2013-05-10 DIAGNOSIS — E8881 Metabolic syndrome: Secondary | ICD-10-CM

## 2013-05-10 DIAGNOSIS — M79674 Pain in right toe(s): Secondary | ICD-10-CM

## 2013-05-10 DIAGNOSIS — F32A Depression, unspecified: Secondary | ICD-10-CM

## 2013-05-10 DIAGNOSIS — F411 Generalized anxiety disorder: Secondary | ICD-10-CM

## 2013-05-10 DIAGNOSIS — F3289 Other specified depressive episodes: Secondary | ICD-10-CM

## 2013-05-10 DIAGNOSIS — R7309 Other abnormal glucose: Secondary | ICD-10-CM

## 2013-05-10 DIAGNOSIS — K219 Gastro-esophageal reflux disease without esophagitis: Secondary | ICD-10-CM

## 2013-05-10 DIAGNOSIS — F329 Major depressive disorder, single episode, unspecified: Secondary | ICD-10-CM

## 2013-05-10 LAB — COMPREHENSIVE METABOLIC PANEL
Albumin: 3.7 g/dL (ref 3.5–5.2)
BUN: 5 mg/dL — ABNORMAL LOW (ref 6–23)
CO2: 27 mEq/L (ref 19–32)
Glucose, Bld: 77 mg/dL (ref 70–99)
Potassium: 4.1 mEq/L (ref 3.5–5.3)
Sodium: 136 mEq/L (ref 135–145)
Total Bilirubin: 0.8 mg/dL (ref 0.3–1.2)
Total Protein: 8 g/dL (ref 6.0–8.3)

## 2013-05-10 LAB — LIPID PANEL
Cholesterol: 149 mg/dL (ref 0–200)
HDL: 32 mg/dL — ABNORMAL LOW (ref >39)
Triglycerides: 150 mg/dL — ABNORMAL HIGH (ref <150)
VLDL: 30 mg/dL (ref 0–40)

## 2013-05-10 MED ORDER — VENLAFAXINE HCL ER 75 MG PO CP24: ORAL_CAPSULE | ORAL | Status: DC

## 2013-05-10 MED ORDER — METFORMIN HCL 500 MG PO TABS: 500.0000 mg | ORAL_TABLET | Freq: Two times a day (BID) | ORAL | Status: DC

## 2013-05-10 MED ORDER — VITAMIN D (ERGOCALCIFEROL) 1.25 MG (50000 UNIT) PO CAPS: ORAL_CAPSULE | ORAL | Status: DC

## 2013-05-10 MED ORDER — PHENTERMINE HCL 37.5 MG PO TABS
37.5000 mg | ORAL_TABLET | Freq: Every day | ORAL | Status: DC
Start: 2013-05-10 — End: 2013-08-13

## 2013-05-10 MED ORDER — ALPRAZOLAM 0.25 MG PO TABS: 0.2500 mg | ORAL_TABLET | Freq: Every evening | ORAL | Status: DC | PRN

## 2013-05-10 NOTE — Telephone Encounter (Signed)
Called and left message on voicemail at [phone].

## 2013-05-10 NOTE — Telephone Encounter (Signed)
Received referral via fax from Dr. Moshe Cipro for dx: prediabetes, obesity.

## 2013-05-10 NOTE — Patient Instructions (Addendum)
F/u in 3.5 month, call if you need me before  Labs today hBA1C, lipid, cmp, I will send result note to you   You are referred to Dr Aline Brochure and to  Dr . Orvan July for appetite control is phentermine take HALF tablet every morning at breakfast  You are referred fopr individual nutrtion counseling at [facility], the dieittian will call you to sched appt. Pleas go prepared, journaling food intake, and focus on carbohydrate counting since you are [prediabetic  Commit to two 10 minute walking sessions/other exercise every day please  Lab in 3.5 month before visit, HBa1C and chem 7  New for  Blood sugar is metformin , start one daily for 3 to 4 days, then increase to one twice daily as prescribed

## 2013-05-10 NOTE — Telephone Encounter (Signed)
Noted  

## 2013-05-10 NOTE — Telephone Encounter (Signed)
pls add to lab

## 2013-05-10 NOTE — Progress Notes (Signed)
  Subjective:    Patient ID: Holly Alvarado, female    DOB: 18-Aug-1966, 47 y.o.   MRN: [id number]  HPI  The PT is here for follow up and re-evaluation of chronic medical conditions, medication management and review of any available recent lab and radiology data.  Preventive health is updated, specifically  Cancer screening and Immunization.   Now ready to see surgeon for intervention for obesity, was "caught up in family commitments for the Summer. Has reduced carb intake , but calories and exercise are still unchanged, with continued weight gain The PT denies any adverse reactions to current medications since the last visit.  C/o worsening right ankle and left great toe, limits physical activity, requests ortho eval   Review of Systems See HPI Denies recent fever or chills. Denies sinus pressure, nasal congestion, ear pain or sore throat. Denies chest congestion, productive cough or wheezing. Denies chest pains, palpitations and leg swelling Denies abdominal pain, nausea, vomiting,diarrhea or constipation.   Denies dysuria, frequency, hesitancy or incontinence.   . Denies headaches, seizures, numbness, or tingling. Denies uncontrolled depression, anxiety or insomnia.Reports less dependence on xanax Denies skin break down or rash.        Objective:   Physical Exam  Patient alert and oriented and in no cardiopulmonary distress.  HEENT: No facial asymmetry, EOMI, no sinus tenderness,  oropharynx pink and moist.  Neck supple no adenopathy.  Chest: Clear to auscultation bilaterally.  CVS: S1, S2 no murmurs, no S3.  ABD: Soft non tender. Bowel sounds normal.  Ext: No edema  MS: Adequate ROM spine, shoulders,reduced in  hips and knees.Left ankle swollen and deformed with limitation in mobility. Right  great toe tender at base, no erythema or warmth  Skin: Intact, no ulcerations or rash noted.  Psych: Good eye contact, normal affect. Memory intact not anxious or depressed  appearing.  CNS: CN 2-12 intact, power, tone and sensation normal throughout.       Assessment & Plan:

## 2013-05-11 DIAGNOSIS — E559 Vitamin D deficiency, unspecified: Secondary | ICD-10-CM | POA: Insufficient documentation

## 2013-05-11 MED ORDER — ERGOCALCIFEROL 1.25 MG (50000 UT) PO CAPS: 50000.0000 [IU] | ORAL_CAPSULE | ORAL | Status: DC

## 2013-05-11 NOTE — Telephone Encounter (Signed)
Lab added

## 2013-05-15 NOTE — Telephone Encounter (Signed)
No response to previous contact attempt. Sent letter to pt home via [place] [employer] in attempt to contact pt to schedule appointment.

## 2013-05-16 LAB — HEPATITIS PANEL, ACUTE
Hep A IgM: NEGATIVE
Hep B C IgM: NEGATIVE

## 2013-05-17 ENCOUNTER — Telehealth: Payer: Self-pay | Admitting: Family Medicine

## 2013-05-17 DIAGNOSIS — M79674 Pain in right toe(s): Secondary | ICD-10-CM | POA: Insufficient documentation

## 2013-05-17 NOTE — Telephone Encounter (Signed)
X rays are ordered

## 2013-05-18 ENCOUNTER — Ambulatory Visit (HOSPITAL_COMMUNITY)
Admission: RE | Admit: 2013-05-18 | Discharge: 2013-05-18 | Disposition: A | Discharge status: Home / Self Care | Payer: BC Managed Care – PPO | Source: Ambulatory Visit | Attending: Family Medicine | Admitting: Family Medicine

## 2013-05-18 ENCOUNTER — Other Ambulatory Visit: Payer: Self-pay | Admitting: Family Medicine

## 2013-05-18 DIAGNOSIS — M79674 Pain in right toe(s): Secondary | ICD-10-CM

## 2013-05-18 DIAGNOSIS — M79609 Pain in unspecified limb: Secondary | ICD-10-CM | POA: Insufficient documentation

## 2013-05-18 DIAGNOSIS — M25579 Pain in unspecified ankle and joints of unspecified foot: Secondary | ICD-10-CM | POA: Insufficient documentation

## 2013-05-18 DIAGNOSIS — R748 Abnormal levels of other serum enzymes: Secondary | ICD-10-CM | POA: Insufficient documentation

## 2013-05-18 DIAGNOSIS — M25572 Pain in left ankle and joints of left foot: Secondary | ICD-10-CM

## 2013-05-21 DIAGNOSIS — E8881 Metabolic syndrome: Secondary | ICD-10-CM | POA: Insufficient documentation

## 2013-05-21 NOTE — Assessment & Plan Note (Signed)
Pt to commit to 6 months of weekly therapy

## 2013-05-21 NOTE — Assessment & Plan Note (Signed)
Controlled, no change in medication  

## 2013-05-21 NOTE — Assessment & Plan Note (Signed)
Improved and controlled on current med

## 2013-05-21 NOTE — Assessment & Plan Note (Signed)
Increased and limits mobility, requests ortho eval

## 2013-05-21 NOTE — Assessment & Plan Note (Signed)
Improved, reports less use of xanax

## 2013-05-21 NOTE — Assessment & Plan Note (Signed)
Pt at increased CV risk and states she is commited to reversing this by weight loss and lifestyle change

## 2013-05-21 NOTE — Assessment & Plan Note (Signed)
Controlled, no change in medication DASH diet and commitment to daily physical activity for a minimum of 30 minutes discussed and encouraged, as a part of hypertension management. The importance of attaining a healthy weight is also discussed.  

## 2013-05-21 NOTE — Assessment & Plan Note (Signed)
Improved HBA1C despite weight gain, pt applauded on this and encouraged to consistently adopt lifestyel change to facilitate weight loss

## 2013-05-21 NOTE — Assessment & Plan Note (Signed)
Requests ortho eval same done , exam reveals mild tenderness in MP joint

## 2013-05-21 NOTE — Assessment & Plan Note (Signed)
Deteriorated. Patient re-educated about  the importance of commitment to a  minimum of 150 minutes of exercise per week. The importance of healthy food choices with portion control discussed. Encouraged to start a food diary, count calories and to consider  joining a support group. Sample diet sheets offered. Goals set by the patient for the next several months.   Pt to start phentermine, and is committed to surgical intervention, states that December is the likely time she will have surgery, based on her work schedule.I explained the need to start the process asap

## 2013-05-21 NOTE — Telephone Encounter (Signed)
No response from previous contact attempts. Referral filed.

## 2013-05-29 ENCOUNTER — Ambulatory Visit (INDEPENDENT_AMBULATORY_CARE_PROVIDER_SITE_OTHER): Payer: BC Managed Care – PPO | Admitting: Orthopedic Surgery

## 2013-05-29 VITALS — BP 122/86 | Ht 62.0 in | Wt 302.0 lb

## 2013-05-29 DIAGNOSIS — M6789 Other specified disorders of synovium and tendon, multiple sites: Secondary | ICD-10-CM

## 2013-05-29 DIAGNOSIS — M79609 Pain in unspecified limb: Secondary | ICD-10-CM

## 2013-05-29 DIAGNOSIS — M76829 Posterior tibial tendinitis, unspecified leg: Secondary | ICD-10-CM

## 2013-05-29 DIAGNOSIS — M79674 Pain in right toe(s): Secondary | ICD-10-CM

## 2013-05-29 NOTE — Patient Instructions (Addendum)
Referral to Dr Caprice Beaver for right great toe pain   Wear cam walker x 6 weeks for tendonitis left ankle   Posterior Tibial Tendon Tendinitis  Tendonitis is a condition that is characterized by inflammation of a tendon or the lining (sheath) that surrounds it. The inflammation is usually caused by damage to the tendon, such as a tendon tear (strain). Sprains are classified into three categories. Grade 1 sprains cause pain, but the tendon is not lengthened. Grade 2 sprains include a lengthened ligament due to the ligament being stretched or partially ruptured. With grade 2 sprains there is still function, although the function may be diminished. Grade 3 sprains are characterized by a complete tear of the tendon or muscle, and function is usually impaired. Posterior tibialis tendonitis is tendonitis of the posterior tibial tendon, which attaches muscles of the lower leg to the foot. The posterior tibial tendon is located in the back of the ankle and helps the body straighten (plantarflex) and rotate inward (medially rotate) the ankle. SYMPTOMS   Pain, tenderness, swelling, warmth, and/or redness over the back of the inner ankle at the posterior tibial tendon or the inner part of the mid-foot.  Pain that worsens with plantarflexion or medial rotation of the ankle.  A crackling sound (crepitation) when the tendon is moved or touched. CAUSES  Posterior tibial tendonitis occurs when damage to the posterior tibial tendon starts an inflammatory response. Common mechanisms of injury include:  Degenerative (occurs with aging) processes that weaken the tendon and make it more susceptible to injury.  Stress placed on the tendon from an increase in the intensity, frequency, or duration of training.  Direct trauma to the ankle.  Returning to activity before a previous ankle injury is allowed to heal. RISK INCREASES WITH:  Activities that involve repetitive and/or stressful plantarflexion (jumping, kicking,  or running up/down hills).  Poor strength and flexibility.  Flat feet.  Previous injury to the foot, ankle, or leg. PREVENTION   Warm up and stretch properly before activity.  Allow for adequate recovery between workouts.  Maintain physical fitness:  Strength, flexibility, and endurance.  Cardiovascular fitness.  Learn and use proper technique. When possible, have a coach correct improper technique.  Complete rehabilitation from a previous foot, ankle, or leg injury.  If you have flat feet, wear arch supports (orthotics). PROGNOSIS  If treated properly, then the symptoms of tendonitis usually resolve within 6 weeks. This period may be shorter for injuries caused by direct trauma. RELATED COMPLICATIONS   Prolonged healing time, if improperly treated or re-injured.  Recurrent symptoms that result in a chronic problem.  Partial or complete tendon tear (rupture) requiring surgery. TREATMENT  Treatment initially involves the use of ice and medication to help reduce pain and inflammation. Often times, your caregiver will recommend immobilizing the ankle to allow the tendon to heal. If you have flat feet, the you may be advised to wear orthotic arch supports. If symptoms persist for greater than 6 months despite non-surgical (conservative) treatment, then surgery may be recommended. MEDICATION   If pain medication is necessary, then nonsteroidal anti-inflammatory medications, such as aspirin and ibuprofen, or other minor pain relievers, such as acetaminophen, are often recommended.  Do not take pain medication for 7 days before surgery.  Prescription pain relievers may be given if deemed necessary by your caregiver. Use only as directed and only as much as you need.  Corticosteroid injections may be given by your caregiver. These injections should be reserved for the most  serious cases, because they may only be given a certain number of times. HEAT AND COLD  Cold treatment  (icing) relieves pain and reduces inflammation. Cold treatment should be applied for 10 to 15 minutes every 2 to 3 hours for inflammation and pain and immediately after any activity that aggravates your symptoms. Use ice packs or massage the area with a piece of ice (ice massage).  Heat treatment may be used prior to performing the stretching and strengthening activities prescribed by your caregiver, physical therapist, or athletic trainer. Use a heat pack or soak the injury in warm water. SEEK MEDICAL CARE IF:  Treatment seems to offer no benefit, or the condition worsens.  Any medications produce adverse side effects.

## 2013-05-30 ENCOUNTER — Encounter (INDEPENDENT_AMBULATORY_CARE_PROVIDER_SITE_OTHER): Payer: BC Managed Care – PPO | Admitting: Internal Medicine

## 2013-05-30 ENCOUNTER — Encounter: Payer: Self-pay | Admitting: Orthopedic Surgery

## 2013-05-30 NOTE — Progress Notes (Signed)
Patient ID: Holly Alvarado, female   DOB: 1966-07-13, 47 y.o.   MRN: [id number]  Chief Complaint  Patient presents with  . Foot Pain    Left ankle pain and right great toe pain    HISTORY: Complaints left ankle swelling and pain in turning out of the left foot  Second complaint pain dorsolateral and plantar aspect of the right great toe  Symptoms came on gradually not sure if there was any injury. The pain is sharp and stabbing in the right great toe and dull aching pain medial ankle and posterior aspect of the tibia. Pain ranges from 6-8 worse with walking and standing improved by rest and recumbency. There is a feeling that the right great toe is locking.  The review of systems is noted for occasional heart palpitations she does have some heartburn history and constipation. There is some skin anxiety she is allergic to sulfa drugs and the musculoskeletal complaints as described all other systems are negative  Current Outpatient Prescriptions on File Prior to Visit  Medication Sig Dispense Refill  . ALPRAZolam (XANAX) 0.25 MG tablet Take 1 tablet (0.25 mg total) by mouth at bedtime as needed.  30 tablet  4  . aspirin (ASPIRIN LOW DOSE) 81 MG EC tablet Take 81 mg by mouth daily. Take one tablet by mouth daily       . ergocalciferol (VITAMIN D2) 50000 UNITS capsule Take 1 capsule (50,000 Units total) by mouth once a week. One capsule once weekly  12 capsule  1  . esomeprazole (NEXIUM) 40 MG capsule Take 1 capsule (40 mg total) by mouth daily.  30 capsule  4  . fluticasone (FLONASE) 50 MCG/ACT nasal spray Place 2 sprays into the nose daily.  16 g  2  . loratadine (CLARITIN) 10 MG tablet Take 1 tablet (10 mg total) by mouth daily.  30 tablet  3  . metFORMIN (GLUCOPHAGE) 500 MG tablet Take 1 tablet (500 mg total) by mouth 2 (two) times daily with a meal.  60 tablet  3  . metoprolol succinate (TOPROL-XL) 50 MG 24 hr tablet Take with or immediately following a meal.TAKE 1 TABLET BY MOUTH TWICE A  DAY  60 tablet  4  . phentermine (ADIPEX-P) 37.5 MG tablet Take 1 tablet (37.5 mg total) by mouth daily before breakfast.  30 tablet  1  . venlafaxine XR (EFFEXOR-XR) 75 MG 24 hr capsule TAKE ONE CAPSULE BY MOUTH EVERY DAY  30 capsule  4   No current facility-administered medications on file prior to visit.    Past Medical History  Diagnosis Date  . Allergic rhinitis, seasonal   . Anxiety disorder   . GERD (gastroesophageal reflux disease)   . Obesity   . Hypertension   . Chronic constipation   . Chronic diarrhea   . Hemorrhoids   . Esophageal polyp   . Fatty liver    Past Surgical History  Procedure Laterality Date  . Ectopic pregnancy surgery  1994, 1995  . Blt  1987  . Cholecystectomy  1997  . Total abdominal hysterectomy  03/11/08    for fibroids   . Mouth surgery      wisdom tooth extraction  . Partial hysterectomy    . Colonoscopy  12/30/2010  . Upper gastrointestinal endoscopy  12/30/2010  . Upper gastrointestinal endoscopy  05/11/04  . Upper gastrointestinal endoscopy  04/30/97    ANWAR    BP 122/86  Ht 5' 2"  (1.575 m)  Wt 302 lb (136.986  kg)  BMI 55.22 kg/m2 BMI is noted. Orientation x3 normal, mood and affect normal Ambulation marked by increased foot progression angle on the left side. Flexible pes planus is noted bilaterally  Left foot and ankle there is swelling of the left foot and ankle with medial tenderness along the posterior tibial tendon without range of motion restriction. Ankle remained stable. Posterior tibial tendon weakness is noted. Scans intact pulse and temperature are normal there is edema in the ankle and pretibial regions as well. Sensation remains normal there are no pathologic reflexes and she remained pain stood balance and coordination  The right great toe does not exhibit bunion deformity all there is tenderness around the Metatarsophalangeal joint. Swelling is not noted. Flexion extension the joint does not produce  discomfort.  X-rays were obtained prior to visit there are no degenerative changes in the ankle and there is no significant or noticeable bunion deformity of the great toe  Impression posterior tibial tendon dysfunction left ankle  Pain left great toe will be further worked up our referral to podiatry  Followup 6 weeks after 6 weeks of immobilization in a short Cam Walker

## 2013-05-30 NOTE — Progress Notes (Signed)
error 

## 2013-05-30 NOTE — Progress Notes (Deleted)
Subjective:     Patient ID: Holly Alvarado, female   DOB: 09-Nov-1965, 47 y.o.   MRN: [id number]  HPI Referred to our office by Dr. Moshe Cipro for elevated LFTs.   05/10/2013: Acute Hepatitis Panel for A,B,and C were negative.  CMP     Component Value Date/Time   NA 136 05/10/2013 0945   K 4.1 05/10/2013 0945   CL 104 05/10/2013 0945   CO2 27 05/10/2013 0945   GLUCOSE 77 05/10/2013 0945   BUN 5* 05/10/2013 0945   CREATININE 0.64 05/10/2013 0945   CREATININE 0.72 08/06/2010 1152   CALCIUM 9.1 05/10/2013 0945   PROT 8.0 05/10/2013 0945   ALBUMIN 3.7 05/10/2013 0945   AST 98* 05/10/2013 0945   ALT 59* 05/10/2013 0945   ALKPHOS 104 05/10/2013 0945   BILITOT 0.8 05/10/2013 0945   GFRNONAA >60 08/06/2010 1152   GFRAA  Value: >60        The eGFR has been calculated using the MDRD equation. This calculation has not been validated in all clinical situations. eGFR's persistently <60 mL/min signify possible Chronic Kidney Disease. 08/06/2010 1152   One year ago her AST 124 and ALt 52, ALP 82.  12/28/2012 TSH 6.1 High  CBC    Component Value Date/Time   WBC 6.7 07/25/2012 0745   RBC 5.44* 07/25/2012 0745   HGB 12.9 07/25/2012 0745   HCT 39.5 07/25/2012 0745   PLT 171 07/25/2012 0745   MCV 72.6* 07/25/2012 0745   MCH 23.7* 07/25/2012 0745   MCHC 32.7 07/25/2012 0745   RDW 16.1* 07/25/2012 0745   LYMPHSABS 3.9 06/04/2011 0911   MONOABS 0.5 06/04/2011 0911   EOSABS 0.1 06/04/2011 0911   BASOSABS 0.0 06/04/2011 0911    Erythrocyte Sedimentation Rate     Component Value Date/Time   ESRSEDRATE 14 12/28/2012 1240      05/15/2013 US abdomen:  Common bile duct: 8.6 mm. This is consistent with post  cholecystectomy state.  Liver: Mild heterogeneity is noted without focal mass lesion.  IMPRESSION:  Mild heterogeneity of the liver without definitive mass. No other  focal abnormality is seen.    Review of Systems     Objective:   Physical Exam     Assessment:     ***    Plan:     ***       This encounter was created in error - please disregard.

## 2013-06-04 ENCOUNTER — Telehealth: Payer: Self-pay | Admitting: *Deleted

## 2013-06-04 ENCOUNTER — Other Ambulatory Visit: Payer: Self-pay | Admitting: *Deleted

## 2013-06-04 DIAGNOSIS — M79674 Pain in right toe(s): Secondary | ICD-10-CM

## 2013-06-04 NOTE — Telephone Encounter (Signed)
Appointment made with Dr. Blanch Media in [place] for 06/18/13 at 2:15 pm.Office notes were faxed. Patient aware of appointment date and time. Told to take insurance card, co-pay, and med list, and to arrive 15 minutes early.

## 2013-07-12 ENCOUNTER — Encounter: Payer: Self-pay | Admitting: Orthopedic Surgery

## 2013-07-12 ENCOUNTER — Ambulatory Visit: Payer: BC Managed Care – PPO | Admitting: Orthopedic Surgery

## 2013-08-02 ENCOUNTER — Other Ambulatory Visit: Payer: Self-pay

## 2013-08-13 ENCOUNTER — Other Ambulatory Visit: Payer: Self-pay | Admitting: Family Medicine

## 2013-08-13 ENCOUNTER — Ambulatory Visit (INDEPENDENT_AMBULATORY_CARE_PROVIDER_SITE_OTHER): Payer: BC Managed Care – PPO | Admitting: Family Medicine

## 2013-08-13 ENCOUNTER — Encounter: Payer: Self-pay | Admitting: Family Medicine

## 2013-08-13 VITALS — BP 138/82 | HR 72 | Resp 18 | Ht 62.0 in | Wt 301.1 lb

## 2013-08-13 DIAGNOSIS — K219 Gastro-esophageal reflux disease without esophagitis: Secondary | ICD-10-CM

## 2013-08-13 DIAGNOSIS — A048 Other specified bacterial intestinal infections: Secondary | ICD-10-CM

## 2013-08-13 DIAGNOSIS — Z1211 Encounter for screening for malignant neoplasm of colon: Secondary | ICD-10-CM

## 2013-08-13 DIAGNOSIS — Z6841 Body Mass Index (BMI) 40.0 and over, adult: Secondary | ICD-10-CM

## 2013-08-13 DIAGNOSIS — Z23 Encounter for immunization: Secondary | ICD-10-CM

## 2013-08-13 DIAGNOSIS — R7989 Other specified abnormal findings of blood chemistry: Secondary | ICD-10-CM

## 2013-08-13 DIAGNOSIS — R1013 Epigastric pain: Secondary | ICD-10-CM

## 2013-08-13 DIAGNOSIS — R7303 Prediabetes: Secondary | ICD-10-CM

## 2013-08-13 DIAGNOSIS — R1011 Right upper quadrant pain: Secondary | ICD-10-CM

## 2013-08-13 DIAGNOSIS — I10 Essential (primary) hypertension: Secondary | ICD-10-CM

## 2013-08-13 DIAGNOSIS — R768 Other specified abnormal immunological findings in serum: Secondary | ICD-10-CM

## 2013-08-13 DIAGNOSIS — F411 Generalized anxiety disorder: Secondary | ICD-10-CM

## 2013-08-13 DIAGNOSIS — R7309 Other abnormal glucose: Secondary | ICD-10-CM

## 2013-08-13 DIAGNOSIS — E559 Vitamin D deficiency, unspecified: Secondary | ICD-10-CM

## 2013-08-13 LAB — CBC WITH DIFFERENTIAL/PLATELET
Eosinophils Absolute: 0.1 10*3/uL (ref 0.0–0.7)
HCT: 45.3 % (ref 36.0–46.0)
Hemoglobin: 14.6 g/dL (ref 12.0–15.0)
Lymphocytes Relative: 80 % — ABNORMAL HIGH (ref 12–46)
Lymphs Abs: 5.3 10*3/uL — ABNORMAL HIGH (ref 0.7–4.0)
Monocytes Absolute: 0.3 10*3/uL (ref 0.1–1.0)
Monocytes Relative: 5 % (ref 3–12)
Neutro Abs: 0.9 10*3/uL — ABNORMAL LOW (ref 1.7–7.7)
Neutrophils Relative %: 13 % — ABNORMAL LOW (ref 43–77)
RBC: 6.11 MIL/uL — ABNORMAL HIGH (ref 3.87–5.11)
WBC: 6.7 10*3/uL (ref 4.0–10.5)

## 2013-08-13 LAB — LIPASE: Lipase: 33 U/L (ref 11–59)

## 2013-08-13 LAB — COMPREHENSIVE METABOLIC PANEL
ALT: 92 U/L — ABNORMAL HIGH (ref 0–35)
Albumin: 3.4 g/dL — ABNORMAL LOW (ref 3.5–5.2)
Alkaline Phosphatase: 99 U/L (ref 39–117)
BUN: 7 mg/dL (ref 6–23)
Calcium: 9.8 mg/dL (ref 8.4–10.5)
Chloride: 103 mEq/L (ref 96–112)
Glucose, Bld: 86 mg/dL (ref 70–99)
Potassium: 4.1 mEq/L (ref 3.5–5.3)
Sodium: 138 mEq/L (ref 135–145)
Total Bilirubin: 1 mg/dL (ref 0.3–1.2)
Total Protein: 8.8 g/dL — ABNORMAL HIGH (ref 6.0–8.3)

## 2013-08-13 LAB — POC HEMOCCULT BLD/STL (OFFICE/1-CARD/DIAGNOSTIC): Fecal Occult Blood, POC: NEGATIVE

## 2013-08-13 MED ORDER — ALPRAZOLAM 0.25 MG PO TABS: 0.2500 mg | ORAL_TABLET | Freq: Every evening | ORAL | Status: DC | PRN

## 2013-08-13 MED ORDER — ONDANSETRON HCL 4 MG PO TABS: 4.0000 mg | ORAL_TABLET | Freq: Every day | ORAL | Status: DC | PRN

## 2013-08-13 NOTE — Patient Instructions (Addendum)
F/u in mid January, cancel December appt please   Flu vaccine today  My opinion is that  you may have pancreatitis (mild) Stat lab today lipase , amylase , cmp. I will send a message r e lab to you, also CBc and diff  Non urgent lab today is H pylori.  Rectal exam shows no blood in the stool  Drink mainly clear liquids for the next 2 days  If pain worsens or if  you develop fever or chills , you will need to go to the ED.  You are referred to Dr Laural Golden for furhter evaluation  Take all of your regular medication as before  Increase nexium to one twice daily for the nextt 3 days, also take zantac 150m one twice daily for the next 1 week  Fasting lipid, HBA1C, vit D for mid January appointment

## 2013-08-13 NOTE — Progress Notes (Signed)
  Subjective:    Patient ID: Holly Alvarado, female    DOB: 04/19/1966, 47 y.o.   MRN: [id number]  HPI Yesterday afternoon after lunch she experiencced a 10 plus RUQ pain radiaiting to right upper back , waxes and wanes, aggravated by food so essentially  Liquid  Diet today, currently at a 2. Sour test in mouth, also experiencing burning in throat and lower right teeth. No fever  Or chills, nausea no emesis. Had thsisseveral years ago  Doubled up  on PPI, taking tums    Review of Systems See HPI Denies recent fever or chills. Denies sinus pressure, nasal congestion, ear pain or sore throat. Denies chest congestion, productive cough or wheezing. Denies chest pains, palpitations and leg swelling  Denies dysuria, frequency, hesitancy or incontinence. Denies joint pain, swelling and limitation in mobility. Denies headaches, seizures, numbness, or tingling. Denies depression, anxiety or insomnia. Denies skin break down or rash.        Objective:   Physical Exam Patient alert and oriented and in no cardiopulmonary distress.Pt in pain and anxious   HEENT: No facial asymmetry, EOMI, no sinus tenderness,  oropharynx pink and moist.  Neck supple no adenopathy.  Chest: Clear to auscultation bilaterally.  CVS: S1, S2 no murmurs, no S3.  ABD: Soft right upper quadrant and epigastric tenderness, no guarding or rebound.  Normal BS, no organomegaly or mass palpated. Rectal : heme negative stool Ext: No edema  MS: Adequate ROM spine, shoulders, hips and knees.  Skin: Intact, no ulcerations or rash noted.  Psych: Good eye contact, normal affect. Memory intact not depressed appearing.  CNS: CN 2-12 intact, power, tone and sensation normal throughout.        Assessment & Plan:

## 2013-08-14 ENCOUNTER — Other Ambulatory Visit: Payer: Self-pay | Admitting: Family Medicine

## 2013-08-14 DIAGNOSIS — D72820 Lymphocytosis (symptomatic): Secondary | ICD-10-CM

## 2013-08-14 DIAGNOSIS — R1011 Right upper quadrant pain: Secondary | ICD-10-CM

## 2013-08-14 DIAGNOSIS — R768 Other specified abnormal immunological findings in serum: Secondary | ICD-10-CM | POA: Insufficient documentation

## 2013-08-14 LAB — H. PYLORI ANTIBODY, IGG: H Pylori IgG: 7.55 {ISR} — ABNORMAL HIGH

## 2013-08-14 MED ORDER — AMOXICILL-CLARITHRO-LANSOPRAZ PO MISC: Freq: Two times a day (BID) | ORAL | Status: DC

## 2013-08-19 NOTE — Assessment & Plan Note (Addendum)
Worsened, refer to GI , [place] in August not diagnostic of fatty liver. Refer to gi for further eval

## 2013-08-19 NOTE — Assessment & Plan Note (Addendum)
h pylori tested , will treat if positive Evaluate for pancreatitis also based on symptoms and exam, bowel rest recommended

## 2013-08-19 NOTE — Assessment & Plan Note (Signed)
Uncontrolled increase dose of PPI and add zantac short term. Gi re eval

## 2013-08-19 NOTE — Assessment & Plan Note (Signed)
Deteriorated. Patient re-educated about  the importance of commitment to a  minimum of 150 minutes of exercise per week. The importance of healthy food choices with portion control discussed. Encouraged to start a food diary, count calories and to consider  joining a support group. Sample diet sheets offered. Goals set by the patient for the next several months.    

## 2013-08-19 NOTE — Assessment & Plan Note (Signed)
Triple therapy prescribed

## 2013-08-19 NOTE — Assessment & Plan Note (Signed)
Controlled , continue effexor

## 2013-08-24 ENCOUNTER — Other Ambulatory Visit: Payer: Self-pay | Admitting: Family Medicine

## 2013-08-28 ENCOUNTER — Telehealth: Payer: Self-pay | Admitting: Family Medicine

## 2013-08-28 NOTE — Telephone Encounter (Signed)
Pls call pt, let her know her mammogram is almost 2 years past due, she needs to schedul a mamogram and keep her appt. Her Ins company sent me notification on this pls let her know

## 2013-08-30 ENCOUNTER — Other Ambulatory Visit: Payer: Self-pay

## 2013-08-30 ENCOUNTER — Ambulatory Visit: Payer: BC Managed Care – PPO | Admitting: Family Medicine

## 2013-08-30 DIAGNOSIS — Z1231 Encounter for screening mammogram for malignant neoplasm of breast: Secondary | ICD-10-CM

## 2013-09-04 ENCOUNTER — Ambulatory Visit (HOSPITAL_COMMUNITY): Payer: BC Managed Care – PPO

## 2013-09-05 NOTE — Progress Notes (Signed)
This encounter was created in error - please disregard.

## 2013-09-06 ENCOUNTER — Telehealth (INDEPENDENT_AMBULATORY_CARE_PROVIDER_SITE_OTHER): Payer: Self-pay | Admitting: *Deleted

## 2013-09-06 ENCOUNTER — Encounter (INDEPENDENT_AMBULATORY_CARE_PROVIDER_SITE_OTHER): Payer: Self-pay | Admitting: Internal Medicine

## 2013-09-06 ENCOUNTER — Ambulatory Visit (INDEPENDENT_AMBULATORY_CARE_PROVIDER_SITE_OTHER): Payer: BC Managed Care – PPO | Admitting: Internal Medicine

## 2013-09-06 VITALS — BP 112/54 | HR 64 | Temp 98.5°F | Ht 62.0 in | Wt 301.4 lb

## 2013-09-06 DIAGNOSIS — R748 Abnormal levels of other serum enzymes: Secondary | ICD-10-CM

## 2013-09-06 NOTE — Patient Instructions (Signed)
OV in 3 months. Diet and exercise. Continue present medication

## 2013-09-06 NOTE — Progress Notes (Signed)
Subjective:     Patient ID: Holly Alvarado, female   DOB: [birth year], 47 y.o.   MRN: [id number]  HPI Referred to our office for elevated liver enzymes. She tells me her liver enzymes have been elevated in the past. She tells me she has seen Dr. Laural Golden in the past for this. Appetite is good. No weight loss.  She has gained weight.  She usually has a BM about every 3 days and is hard. She takes Colace for her constipation. No melena or bright red rectal bleeding.   Recently diagnosed with H. Pylori but has not started the Prevpac. She tells me she has been treated in the past for H pylori.   She is planning on having gastric bypass by Dr. Hassell Done. She wants to have the surgery after the end of the school year. 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  She has an appt with hematology for elevated lymphocytes.   Recent Hep. A,B,C was negative (05/10/2013). NO IV drugs or tattoos. CBC    Component Value Date/Time   WBC 6.7 08/13/2013 1618   RBC 6.11* 08/13/2013 1618   HGB 14.6 08/13/2013 1618   HCT 45.3 08/13/2013 1618   PLT 169 08/13/2013 1618   MCV 74.1* 08/13/2013 1618   MCH 23.9* 08/13/2013 1618   MCHC 32.2 08/13/2013 1618   RDW 15.5 08/13/2013 1618   LYMPHSABS 5.3* 08/13/2013 1618   MONOABS 0.3 08/13/2013 1618   EOSABS 0.1 08/13/2013 1618   BASOSABS 0.0 08/13/2013 1618    CMP     Component Value Date/Time   NA 138 08/13/2013 1618   K 4.1 08/13/2013 1618   CL 103 08/13/2013 1618   CO2 29 08/13/2013 1618   GLUCOSE 86 08/13/2013 1618   BUN 7 08/13/2013 1618   CREATININE 0.74 08/13/2013 1618   CREATININE 0.72 08/06/2010 1152   CALCIUM 9.8 08/13/2013 1618   PROT 8.8* 08/13/2013 1618   ALBUMIN 3.4* 08/13/2013 1618   AST 143* 08/13/2013 1618   ALT 92* 08/13/2013 1618   ALKPHOS 99 08/13/2013 1618   BILITOT 1.0 08/13/2013 1618   GFRNONAA >60 08/06/2010 1152   GFRAA  Value: >60        The eGFR has been calculated using the MDRD equation. This calculation has not been validated  in all clinical situations. eGFR's persistently <60 mL/min signify possible Chronic Kidney Disease. 08/06/2010 1152     05/15/2013 US abdomen: elevated liver enzymes: IMPRESSION:  Mild heterogeneity of the liver without definitive mass. No other  focal abnormality is seen.   01/09/2011 EGD/Colonoscopy: Focal antral gastritis with a scar. No evidence of erosive esophagitis  or active PUD.  Colonoscopy: Three small polyps ablated from the sigmoid colon.  Biopsy: Hyperplastic polyps Review of Systems     Objective:   Physical Exam  Filed Vitals:   09/06/13 0935  BP: 112/54  Pulse: 64  Temp: 98.5 F (36.9 C)  Height: 5' 2"  (1.575 m)  Weight: 301 lb 6.4 oz (136.714 kg)  Alert and oriented. Skin warm and dry. Oral mucosa is moist.   . Sclera anicteric, conjunctivae is pink. Thyroid not enlarged. No cervical lymphadenopathy. Lungs clear. Heart regular rate and rhythm.  Abdomen is soft. Bowel sounds are positive. No hepatomegaly. No abdominal masses felt. No tenderness.  1+ edema to left lower extremities.        Assessment:    Elevated transaminases. Hx of same in past. Hepatitis markers are negative. Patient is morbidily obese.  Plan:    Diet and weight loss.Hepatic function fasting. OV in 3 months with Hepatic function. Start Prevpac.

## 2013-09-06 NOTE — Telephone Encounter (Signed)
.  Per Lelon Perla the patient will need to have lab work done in March. Lab is noted 12/05/13.

## 2013-09-10 ENCOUNTER — Encounter (HOSPITAL_COMMUNITY): Payer: BC Managed Care – PPO | Attending: Hematology and Oncology

## 2013-09-10 ENCOUNTER — Encounter (HOSPITAL_COMMUNITY): Payer: Self-pay

## 2013-09-10 VITALS — Ht 62.25 in | Wt 302.6 lb

## 2013-09-10 DIAGNOSIS — J301 Allergic rhinitis due to pollen: Secondary | ICD-10-CM

## 2013-09-10 DIAGNOSIS — E8881 Metabolic syndrome: Secondary | ICD-10-CM

## 2013-09-10 DIAGNOSIS — D563 Thalassemia minor: Secondary | ICD-10-CM | POA: Diagnosis not present

## 2013-09-10 DIAGNOSIS — R718 Other abnormality of red blood cells: Secondary | ICD-10-CM

## 2013-09-10 DIAGNOSIS — D72820 Lymphocytosis (symptomatic): Secondary | ICD-10-CM | POA: Diagnosis present

## 2013-09-10 DIAGNOSIS — J309 Allergic rhinitis, unspecified: Secondary | ICD-10-CM

## 2013-09-10 DIAGNOSIS — K7689 Other specified diseases of liver: Secondary | ICD-10-CM

## 2013-09-10 DIAGNOSIS — K635 Polyp of colon: Secondary | ICD-10-CM

## 2013-09-10 DIAGNOSIS — K760 Fatty (change of) liver, not elsewhere classified: Secondary | ICD-10-CM | POA: Insufficient documentation

## 2013-09-10 LAB — CBC WITH DIFFERENTIAL/PLATELET
HCT: 42.8 % (ref 36.0–46.0)
Hemoglobin: 14 g/dL (ref 12.0–15.0)
Lymphocytes Relative: 76 % — ABNORMAL HIGH (ref 12–46)
Lymphs Abs: 5.2 10*3/uL — ABNORMAL HIGH (ref 0.7–4.0)
MCHC: 32.7 g/dL (ref 30.0–36.0)
Monocytes Absolute: 0.4 10*3/uL (ref 0.1–1.0)
Monocytes Relative: 5 % (ref 3–12)
Neutro Abs: 1.2 10*3/uL — ABNORMAL LOW (ref 1.7–7.7)
Neutrophils Relative %: 17 % — ABNORMAL LOW (ref 43–77)
WBC: 6.9 10*3/uL (ref 4.0–10.5)

## 2013-09-10 LAB — COMPREHENSIVE METABOLIC PANEL
ALT: 49 U/L — ABNORMAL HIGH (ref 0–35)
AST: 77 U/L — ABNORMAL HIGH (ref 0–37)
Alkaline Phosphatase: 123 U/L — ABNORMAL HIGH (ref 39–117)
CO2: 27 mEq/L (ref 19–32)
Chloride: 106 mEq/L (ref 96–112)
GFR calc non Af Amer: >90 mL/min (ref >90)
Potassium: 3.9 mEq/L (ref 3.5–5.1)
Sodium: 140 mEq/L (ref 135–145)
Total Bilirubin: 0.8 mg/dL (ref 0.3–1.2)

## 2013-09-10 LAB — RETICULOCYTES: Retic Count, Absolute: 139 10*3/uL (ref 19.0–186.0)

## 2013-09-10 NOTE — Progress Notes (Signed)
Holly Alvarado presented for labwork. Labs per MD order drawn via Peripheral Line 23 gauge needle inserted in left AC  Good blood return present. Procedure without incident.  Needle removed intact. Patient tolerated procedure well.

## 2013-09-10 NOTE — Progress Notes (Signed)
Wharton A. Barnet Glasgow, M.D.  NEW PATIENT EVALUATION   Name: Holly Alvarado Date: 09/10/2013 MRN: [id number] DOB: Mar 13, 1966  PCP: [name], MD   REFERRING PHYSICIAN: [name], MD  REASON FOR REFERRAL: Lymphocytosis with normal total white cell count.     HISTORY OF PRESENT ILLNESS:Holly Alvarado is a 47 y.o. female who is referred by her family physician because of lymphocytosis with the normal total WBC count. She also suffers from allergic rhinitis as well as metabolic syndrome, hypertension, gastroesophageal reflux disease, generalized anxiety disorder, and morbid obesity. Absolute lymphocyte count performed on 08/13/2013 was 5.3 with a total white count of 6.7, 80% lymphocytes. Within this clinic several years ago for microcytosis and those records are being searched 4. Red cell count is elevated so I have a suspicion that the diagnosis is probably thalassemia trait. She is anticipating undergoing gastric bypass surgery this summer and because of the abnormal blood count was sent for evaluation. She had undergone hysterectomy in the past but still has discomfort in the breasts monthly when. As would be anticipated. She denies any night sweats, diarrhea, constipation, melena, hematochezia, hematuria, but does get sinus stuffiness with intermittent infections. She admits to easy satiety for many years. Reflux symptoms are controlled with proton pump inhibitors. She works as a Marine scientist in [employer]. She is involved and health education. She denies any episodes of dark urine, lymphadenopathy, pneumonia, tuberculosis, or previous malignancy.   PAST MEDICAL HISTORY:  has a past medical history of Allergic rhinitis, seasonal; Anxiety disorder; GERD (gastroesophageal reflux disease); Obesity; Hypertension; Chronic constipation; Hemorrhoids; Esophageal polyp; and Fatty liver.     PAST SURGICAL HISTORY: Past Surgical  History  Procedure Laterality Date  . Ectopic pregnancy surgery  1994, 1995  . Blt  1987  . Cholecystectomy  1997  . Total abdominal hysterectomy  03/11/08    for fibroids   . Mouth surgery      wisdom tooth extraction  . Partial hysterectomy    . Colonoscopy  12/30/2010  . Upper gastrointestinal endoscopy  12/30/2010  . Upper gastrointestinal endoscopy  05/11/04  . Upper gastrointestinal endoscopy  04/30/97    ANWAR  . Breast biopsy Right 2010    benign     CURRENT MEDICATIONS: has a current medication list which includes the following prescription(s): alprazolam, aspirin, diphenhydramine, ergocalciferol, esomeprazole, ibuprofen, metoprolol succinate, venlafaxine xr, fluticasone, loratadine, and ondansetron.   ALLERGIES: Sulfonamide derivatives   SOCIAL HISTORY:  reports that she has never smoked. She has never used smokeless tobacco. She reports that she does not drink alcohol or use illicit drugs.   FAMILY HISTORY: family history includes Arthritis in an other family member; Asthma in an other family member; Diabetes in an other family member; Hyperlipidemia in her father, sister, and sister; Hypertension in her father, mother, sister, and sister.    REVIEW OF SYSTEMS:  Other than that discussed above is noncontributory.    PHYSICAL EXAM:  height is 5' 2.25" (1.581 m) and weight is 302 lb 9.6 oz (137.258 kg).    GENERAL:alert, no distress and comfortable. Morbidly obese. SKIN: skin color, texture, turgor are normal, no rashes or significant lesions EYES: normal, Conjunctiva are pink and non-injected, sclera clear OROPHARYNX:no exudate, no erythema and lips, buccal mucosa, and tongue normal  NECK: supple, thyroid normal size, non-tender, without nodularity CHEST: Normal AP diameter with no breast masses. LYMPH:  no palpable lymphadenopathy in the  cervical, axillary or inguinal LUNGS: clear to auscultation and percussion with normal breathing effort HEART: regular rate  & rhythm and no murmurs ABDOMEN:abdomen soft, non-tender and normal bowel sounds MUSCULOSKELETALl:no cyanosis of digits, no clubbing or edema  NEURO: alert & oriented x 3 with fluent speech, no focal motor/sensory deficits    LABORATORY DATA:  Office Visit on 09/10/2013  Component Date Value Range Status  . WBC 09/10/2013 6.9  4.0 - 10.5 K/uL Final  . RBC 09/10/2013 5.79* 3.87 - 5.11 MIL/uL Final  . Hemoglobin 09/10/2013 14.0  12.0 - 15.0 g/dL Final  . HCT 09/10/2013 42.8  36.0 - 46.0 % Final  . MCV 09/10/2013 73.9* 78.0 - 100.0 fL Final  . MCH 09/10/2013 24.2* 26.0 - 34.0 pg Final  . MCHC 09/10/2013 32.7  30.0 - 36.0 g/dL Final  . RDW 09/10/2013 15.6* 11.5 - 15.5 % Final  . Platelets 09/10/2013 168  150 - 400 K/uL Final  . Neutrophils Relative % 09/10/2013 17* 43 - 77 % Final  . Neutro Abs 09/10/2013 1.2* 1.7 - 7.7 K/uL Final  . Lymphocytes Relative 09/10/2013 76* 12 - 46 % Final  . Lymphs Abs 09/10/2013 5.2* 0.7 - 4.0 K/uL Final  . Monocytes Relative 09/10/2013 5  3 - 12 % Final  . Monocytes Absolute 09/10/2013 0.4  0.1 - 1.0 K/uL Final  . Eosinophils Relative 09/10/2013 2  0 - 5 % Final  . Eosinophils Absolute 09/10/2013 0.1  0.0 - 0.7 K/uL Final  . Basophils Relative 09/10/2013 0  0 - 1 % Final  . Basophils Absolute 09/10/2013 0.0  0.0 - 0.1 K/uL Final  . Retic Ct Pct 09/10/2013 2.4  0.4 - 3.1 % Final  . RBC. 09/10/2013 5.79* 3.87 - 5.11 MIL/uL Final  . Retic Count, Manual 09/10/2013 139.0  19.0 - 186.0 K/uL Final  . Sodium 09/10/2013 140  135 - 145 mEq/L Final  . Potassium 09/10/2013 3.9  3.5 - 5.1 mEq/L Final  . Chloride 09/10/2013 106  96 - 112 mEq/L Final  . CO2 09/10/2013 27  19 - 32 mEq/L Final  . Glucose, Bld 09/10/2013 90  70 - 99 mg/dL Final  . BUN 09/10/2013 4* 6 - 23 mg/dL Final  . Creatinine, Ser 09/10/2013 0.73  0.50 - 1.10 mg/dL Final  . Calcium 09/10/2013 9.0  8.4 - 10.5 mg/dL Final  . Total Protein 09/10/2013 8.7* 6.0 - 8.3 g/dL Final  . Albumin 09/10/2013  3.1* 3.5 - 5.2 g/dL Final  . AST 09/10/2013 77* 0 - 37 U/L Final  . ALT 09/10/2013 49* 0 - 35 U/L Final  . Alkaline Phosphatase 09/10/2013 123* 39 - 117 U/L Final  . Total Bilirubin 09/10/2013 0.8  0.3 - 1.2 mg/dL Final  . GFR calc non Af Amer 09/10/2013 >90  >90 mL/min Final  . GFR calc Af Amer 09/10/2013 >90  >90 mL/min Final   Comment: (NOTE)                          The eGFR has been calculated using the CKD EPI equation.                          This calculation has not been validated in all clinical situations.                          eGFR's persistently <90 mL/min signify possible  Chronic Kidney                          Disease.  Marland Kitchen LDH 09/10/2013 216  94 - 250 U/L Final  Orders Only on 08/13/2013  Component Date Value Range Status  . Path Review 08/13/2013    Final   Comment: Absolute lymphocytosis and atypical lymphs which appear reactive.                          Favor reactive process.  However, if absolute lymphocytosis persists,                          suggest immunophenotyping by flow cytometry for further evaluation, if                          clinically indicated.  Microcytic RBCs. RBC morphology suggestive of                          hemoglobinopathy.  Suggest hemoglobin electrophoresis if a new                          finding.                          Reviewed by Odis Hollingshead, MD, PhD, FCAP (Electronic Signature on                          File) 08/14/2013  Office Visit on 08/13/2013  Component Date Value Range Status  . H Pylori IgG 08/13/2013 7.55*  Final   Comment: IgG antibody to H. pylori detected suggestive of previous exposure or                          active infection.                                                        ISR = Immune Status Ratio                                       <0.90         ISR       Negative                                       0.90 - 1.09   ISR       Equivocal                                       >=1.10        ISR        Positive  The above results were obtained with the Immulite 2000 H. pylori IgG                          EIA.  Results obtained from other manufacturer's assay methods may not                          be used interchangeably.                             . WBC 08/13/2013 6.7  4.0 - 10.5 K/uL Final  . RBC 08/13/2013 6.11* 3.87 - 5.11 MIL/uL Final  . Hemoglobin 08/13/2013 14.6  12.0 - 15.0 g/dL Final  . HCT 08/13/2013 45.3  36.0 - 46.0 % Final  . MCV 08/13/2013 74.1* 78.0 - 100.0 fL Final  . MCH 08/13/2013 23.9* 26.0 - 34.0 pg Final  . MCHC 08/13/2013 32.2  30.0 - 36.0 g/dL Final  . RDW 08/13/2013 15.5  11.5 - 15.5 % Final  . Platelets 08/13/2013 169  150 - 400 K/uL Final  . Neutrophils Relative % 08/13/2013 13* 43 - 77 % Final  . Neutro Abs 08/13/2013 0.9* 1.7 - 7.7 K/uL Final  . Lymphocytes Relative 08/13/2013 80* 12 - 46 % Final  . Lymphs Abs 08/13/2013 5.3* 0.7 - 4.0 K/uL Final  . Monocytes Relative 08/13/2013 5  3 - 12 % Final  . Monocytes Absolute 08/13/2013 0.3  0.1 - 1.0 K/uL Final  . Eosinophils Relative 08/13/2013 1  0 - 5 % Final  . Eosinophils Absolute 08/13/2013 0.1  0.0 - 0.7 K/uL Final  . Basophils Relative 08/13/2013 1  0 - 1 % Final  . Basophils Absolute 08/13/2013 0.0  0.0 - 0.1 K/uL Final  . Smear Review 08/13/2013 See Note   Final   Comment: White count confirmed by smear.                          Atypical lymphs.                          Smudge cells  . Sodium 08/13/2013 138  135 - 145 mEq/L Final  . Potassium 08/13/2013 4.1  3.5 - 5.3 mEq/L Final  . Chloride 08/13/2013 103  96 - 112 mEq/L Final  . CO2 08/13/2013 29  19 - 32 mEq/L Final  . Glucose, Bld 08/13/2013 86  70 - 99 mg/dL Final  . BUN 08/13/2013 7  6 - 23 mg/dL Final  . Creat 08/13/2013 0.74  0.50 - 1.10 mg/dL Final  . Total Bilirubin 08/13/2013 1.0  0.3 - 1.2 mg/dL Final  . Alkaline Phosphatase 08/13/2013 99  39 - 117 U/L Final  . AST 08/13/2013 143*  0 - 37 U/L Final  . ALT 08/13/2013 92* 0 - 35 U/L Final  . Total Protein 08/13/2013 8.8* 6.0 - 8.3 g/dL Final  . Albumin 08/13/2013 3.4* 3.5 - 5.2 g/dL Final  . Calcium 08/13/2013 9.8  8.4 - 10.5 mg/dL Final  . Amylase 08/13/2013 108* 0 - 105 U/L Final  . Lipase 08/13/2013 33  11 - 59 U/L Final  . Card #1 Date 08/13/2013 08/13/2013   Final  . Fecal Occult Blood, POC 08/13/2013 Negative   Final   50521 1R 7/15    Urinalysis    Component Value Date/Time  COLORURINE yellow 11/10/2009 1513   APPEARANCEUR Clear 11/10/2009 1513   LABSPEC 1.015 11/10/2009 1513   PHURINE 6.5 11/10/2009 1513   HGBUR negative 11/10/2009 1513   BILIRUBINUR small 05/11/2012 1458   BILIRUBINUR negative 11/10/2009 1513   UROBILINOGEN 1.0 05/11/2012 1458   UROBILINOGEN 0.2 11/10/2009 1513   NITRITE positive 05/11/2012 1458   NITRITE positive 11/10/2009 1513   LEUKOCYTESUR Negative 05/11/2012 1458      @RADIOGRAPHY : No results found.  PATHOLOGY: Peripheral smear does not show evidence of spherocytes or premature forms of red cells or white cells.   IMPRESSION:  #1. Lymphocytosis, possibly chronic lymphocytic leukemia less than stage 0 since total white cell count is normal. Reactive process is also possible. #2. 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 #3. Metabolic syndrome. #4. Morbid obesity. #5. History of colon polyps and previous history of H. pylori gastritis. #6. Status post hysterectomy and unilateral oophorectomy with history of 2 ectopic pregnancies. #7. Allergic rhinitis. #8. Steatosis of the liver on ultrasound with mildly elevated transaminases.   PLAN:  #1. Peripheral blood flow cytometry to rule out chronic lymphocytic leukemia.  #2. Even in CLL is diagnosed, I do not believe this diagnosis would represent a contraindication to undergoing gastric bypass surgery this summer. #3. Followup scheduled on 09/26/2013. #4. Attempts are being made to  secure her previous records from this office.  I appreciate the opportunity of sharing in her care.   Doroteo Bradford, MD 09/10/2013 4:06 PM

## 2013-09-10 NOTE — Patient Instructions (Signed)
[facility] Discharge Instructions  RECOMMENDATIONS MADE BY THE CONSULTANT AND ANY TEST RESULTS WILL BE SENT TO YOUR REFERRING PHYSICIAN.  EXAM FINDINGS BY THE PHYSICIAN TODAY AND SIGNS OR SYMPTOMS TO REPORT TO CLINIC OR PRIMARY PHYSICIAN: Exam and findings as discussed by Dr. Barnet Glasgow.  Need to do some additional lab studies.  MEDICATIONS PRESCRIBED:  none  INSTRUCTIONS/FOLLOW-UP: Follow-up in 2 weeks to discuss results.  Thank you for choosing [facility] to provide your oncology and hematology care.  To afford each patient quality time with our providers, please arrive at least 15 minutes before your scheduled appointment time.  With your help, our goal is to use those 15 minutes to complete the necessary work-up to ensure our physicians have the information they need to help with your evaluation and healthcare recommendations.    Effective January 1st, 2014, we ask that you re-schedule your appointment with our physicians should you arrive 10 or more minutes late for your appointment.  We strive to give you quality time with our providers, and arriving late affects you and other patients whose appointments are after yours.    Again, thank you for choosing [facility].  Our hope is that these requests will decrease the amount of time that you wait before being seen by our physicians.       _____________________________________________________________  Should you have questions after your visit to [facility], please contact our office at [phone] between the hours of 8:30 a.m. and 5:00 p.m.  Voicemails left after 4:30 p.m. will not be returned until the following business day.  For prescription refill requests, have your pharmacy contact our office with your prescription refill request.

## 2013-09-12 LAB — HEMOGLOBINOPATHY EVALUATION
Hgb A2 Quant: 5 % — ABNORMAL HIGH (ref 2.2–3.2)
Hgb A: 93.9 % — ABNORMAL LOW (ref 96.8–97.8)
Hgb F Quant: 1.1 % — ABNORMAL HIGH (ref 0.0–2.0)
Hgb S Quant: 0 %

## 2013-09-17 ENCOUNTER — Other Ambulatory Visit (HOSPITAL_COMMUNITY): Payer: Self-pay | Admitting: Hematology and Oncology

## 2013-09-26 ENCOUNTER — Encounter (HOSPITAL_COMMUNITY): Payer: Self-pay

## 2013-09-26 ENCOUNTER — Encounter (HOSPITAL_BASED_OUTPATIENT_CLINIC_OR_DEPARTMENT_OTHER): Payer: BC Managed Care – PPO

## 2013-09-26 VITALS — BP 128/84 | HR 76 | Temp 97.5°F | Resp 18 | Wt 306.0 lb

## 2013-09-26 DIAGNOSIS — D563 Thalassemia minor: Secondary | ICD-10-CM

## 2013-09-26 DIAGNOSIS — D72820 Lymphocytosis (symptomatic): Secondary | ICD-10-CM

## 2013-09-26 MED ORDER — FLUCONAZOLE 200 MG PO TABS: 200.0000 mg | ORAL_TABLET | Freq: Every day | ORAL | Status: DC

## 2013-09-26 NOTE — Progress Notes (Signed)
[facility]  OFFICE PROGRESS NOTE  Holly Nakayama, MD [address], [address] [place] [address]  DIAGNOSIS: Lymphocytosis  Thalassemia trait, beta  No chief complaint on file.   CURRENT THERAPY: Workup in progress  INTERVAL HISTORY: Holly Alvarado 47 y.o. female returns for followup after additional evaluation to explain lymphocytosis and microcytic red cells.  Hemoglobin electrophoresis is consistent with beta thalassemia trait. She was determined to have recurrent H. pylori gastritis and was started on antibiotics and Prevacid by her PCP and has subsequently developed vaginal discharge. She denies any fever, chills, but has had abdominal discomfort without nausea, vomiting, or diarrhea. She denies any lower extremity swelling or redness, dark urine, PND, orthopnea, palpitations, easy satiety, cough, wheezing, skin rash, headache, or seizures.   MEDICAL HISTORY: Past Medical History  Diagnosis Date  . Allergic rhinitis, seasonal   . Anxiety disorder   . GERD (gastroesophageal reflux disease)   . Obesity   . Hypertension   . Chronic constipation   . Hemorrhoids   . Esophageal polyp   . Fatty liver     INTERIM HISTORY: has Morbid obesity with BMI of 50.0-59.9, adult; GENERALIZED ANXIETY DISORDER; HYPERTENSION; ALLERGIC RHINITIS, SEASONAL; GERD; Left ankle pain; TRIGGER FINGER; ABNORMAL MAMMOGRAM; Trigger thumb; Carotid bruit; Joint pain; Depression; Prediabetes; Unspecified vitamin D deficiency; Elevated LFTs; Pain of right great toe; Metabolic syndrome X; Abdominal pain, epigastric; Positive serology for Helicobacter pylori; Lymphocytosis; Colon polyps,hyperplastic, sigmoid; Steatosis of liver; and Thalassemia trait, beta on her problem list.    ALLERGIES:  is allergic to sulfonamide derivatives.  MEDICATIONS: has a current medication list which includes the following prescription(s): alprazolam, aspirin, diphenhydramine,  ergocalciferol, esomeprazole, fluticasone, ibuprofen, loratadine, metoprolol succinate, ondansetron, and venlafaxine xr.  SURGICAL HISTORY:  Past Surgical History  Procedure Laterality Date  . Ectopic pregnancy surgery  1994, 1995  . Blt  1987  . Cholecystectomy  1997  . Total abdominal hysterectomy  03/11/08    for fibroids   . Mouth surgery      wisdom tooth extraction  . Partial hysterectomy    . Colonoscopy  12/30/2010  . Upper gastrointestinal endoscopy  12/30/2010  . Upper gastrointestinal endoscopy  05/11/04  . Upper gastrointestinal endoscopy  04/30/97    ANWAR  . Breast biopsy Right 2010    benign    FAMILY HISTORY: family history includes Arthritis in an other family member; Asthma in an other family member; Diabetes in an other family member; Hyperlipidemia in her father, sister, and sister; Hypertension in her father, mother, sister, and sister.  SOCIAL HISTORY:  reports that she has never smoked. She has never used smokeless tobacco. She reports that she does not drink alcohol or use illicit drugs.  REVIEW OF SYSTEMS:  Other than that discussed above is noncontributory.  PHYSICAL EXAMINATION: ECOG PERFORMANCE STATUS: 1 - Symptomatic but completely ambulatory  Weight 306 lb (138.801 kg).  GENERAL:alert, no distress and comfortable. Morbidly obese. SKIN: skin color, texture, turgor are normal, no rashes or significant lesions EYES: PERLA; Conjunctiva are pink and non-injected, sclera clear OROPHARYNX:no exudate, no erythema on lips, buccal mucosa, or tongue. NECK: supple, thyroid normal size, non-tender, without nodularity. No masses CHEST: Normal AP diameter with no breast masses. LYMPH:  no palpable lymphadenopathy in the cervical, axillary or inguinal LUNGS: clear to auscultation and percussion with normal breathing effort HEART: regular rate & rhythm and no murmurs. ABDOMEN:abdomen soft, non-tender and normal bowel sounds. Obese  with no palpable  organs. MUSCULOSKELETAL:no cyanosis of digits and no clubbing. Range of motion normal.  NEURO: alert & oriented x 3 with fluent speech, no focal motor/sensory deficits   LABORATORY DATA: Office Visit on 09/10/2013  Component Date Value Range Status  . WBC 09/10/2013 6.9  4.0 - 10.5 K/uL Final  . RBC 09/10/2013 5.79* 3.87 - 5.11 MIL/uL Final  . Hemoglobin 09/10/2013 14.0  12.0 - 15.0 g/dL Final  . HCT 09/10/2013 42.8  36.0 - 46.0 % Final  . MCV 09/10/2013 73.9* 78.0 - 100.0 fL Final  . MCH 09/10/2013 24.2* 26.0 - 34.0 pg Final  . MCHC 09/10/2013 32.7  30.0 - 36.0 g/dL Final  . RDW 09/10/2013 15.6* 11.5 - 15.5 % Final  . Platelets 09/10/2013 168  150 - 400 K/uL Final  . Neutrophils Relative % 09/10/2013 17* 43 - 77 % Final  . Neutro Abs 09/10/2013 1.2* 1.7 - 7.7 K/uL Final  . Lymphocytes Relative 09/10/2013 76* 12 - 46 % Final  . Lymphs Abs 09/10/2013 5.2* 0.7 - 4.0 K/uL Final  . Monocytes Relative 09/10/2013 5  3 - 12 % Final  . Monocytes Absolute 09/10/2013 0.4  0.1 - 1.0 K/uL Final  . Eosinophils Relative 09/10/2013 2  0 - 5 % Final  . Eosinophils Absolute 09/10/2013 0.1  0.0 - 0.7 K/uL Final  . Basophils Relative 09/10/2013 0  0 - 1 % Final  . Basophils Absolute 09/10/2013 0.0  0.0 - 0.1 K/uL Final  . Retic Ct Pct 09/10/2013 2.4  0.4 - 3.1 % Final  . RBC. 09/10/2013 5.79* 3.87 - 5.11 MIL/uL Final  . Retic Count, Manual 09/10/2013 139.0  19.0 - 186.0 K/uL Final  . Sodium 09/10/2013 140  135 - 145 mEq/L Final  . Potassium 09/10/2013 3.9  3.5 - 5.1 mEq/L Final  . Chloride 09/10/2013 106  96 - 112 mEq/L Final  . CO2 09/10/2013 27  19 - 32 mEq/L Final  . Glucose, Bld 09/10/2013 90  70 - 99 mg/dL Final  . BUN 09/10/2013 4* 6 - 23 mg/dL Final  . Creatinine, Ser 09/10/2013 0.73  0.50 - 1.10 mg/dL Final  . Calcium 09/10/2013 9.0  8.4 - 10.5 mg/dL Final  . Total Protein 09/10/2013 8.7* 6.0 - 8.3 g/dL Final  . Albumin 09/10/2013 3.1* 3.5 - 5.2 g/dL Final  . AST 09/10/2013 77* 0 - 37  U/L Final  . ALT 09/10/2013 49* 0 - 35 U/L Final  . Alkaline Phosphatase 09/10/2013 123* 39 - 117 U/L Final  . Total Bilirubin 09/10/2013 0.8  0.3 - 1.2 mg/dL Final  . GFR calc non Af Amer 09/10/2013 >90  >90 mL/min Final  . GFR calc Af Amer 09/10/2013 >90  >90 mL/min Final   Comment: (NOTE)                          The eGFR has been calculated using the CKD EPI equation.                          This calculation has not been validated in all clinical situations.                          eGFR's persistently <90 mL/min signify possible Chronic Kidney  Disease.  Marland Kitchen LDH 09/10/2013 216  94 - 250 U/L Final  . Beta-2 Microglobulin 09/10/2013 2.19* 1.01 - 1.73 mg/L Corrected   Performed at [facility]  . Hgb A2 Quant 09/10/2013 5.0* 2.2 - 3.2 % Final  . Hgb F Quant 09/10/2013 1.1* 0.0 - 2.0 % Final   Comment: (NOTE)                          Patient State                  HbA2 Level         HbF Level                          -----------------------------------------------------------                          Heterozygous B-thalassemia       4-9%               1-5%                          Homozygous B-thalassemia    Normal or increased    80-100%                          Heterozygous HPFH             Less than 1.5%        10-20%                          Homozygous HPFH                  Absent             100%  . Hgb S Quant 09/10/2013 0.0  0.0 % Final  . Hgb A 09/10/2013 93.9* 96.8 - 97.8 % Final  . Hemoglobin Other 09/10/2013 0.0  0.0 % Final   Comment: (NOTE)                          Interpretation                          --------------                          Elevated A2 levels can be seen in Beta Thalassemia Trait and a variety                          of unstable hemoglobin variants.  Elevations can also be seen in                          acquired conditions such as leukemias, megaloblastic anemia and                          hyperthyroidism.  Accurate  interpretation of these results should                          include family history, hemoglobin, hematocrit, and mean corpuscular  volume results, red cell morphology, serum iron binding capacity,                          knowledge of recent transfusions and other pertinent clinical                          findings.                          Reviewed by Odis Hollingshead, MD, PhD, FCAP (Electronic Signature on                          File)                          Performed at [facility]:  FINAL for JALESIA, LOUDENSLAGER ([id number]) Patient: KIRSTIE, LARSEN Collected: 09/10/2013 Client: [facility] Accession: [id number] Received: 09/11/2013 Farrel Gobble, MD DOB: 1966-02-25 Age: 61 Gender: F Reported: 09/11/2013 [address] Patient Ph: [phone] MRN #: [id number] Linna Hoff [place] [address] Visit #: [id number] Chart #: Phone: Fax: CC: FLOW CYTOMETRY REPORT INTERPRETATION Interpretation Peripheral Blood Flow Cytometry - NO MONOCLONAL B-CELL POPULATION OR ABNORMAL T-CELL PHENOTYPE. Vicente Males MD Pathologist, Electronic Signature (Case signed 09/11/2013) GROSS AND MICROSCOPIC INFORMATION Source Peripheral Blood Flow Cytometry Microscopic Gated population: Flow cytometric immunophenotyping is performed using antibiodies to the antigens listed in the table below. Electronic gates are placed around a cell cluster displaying light scatter properties corresponding to lymphocytes. - Abnormal Cells in gated population: NA - Phenotype of Abnormal Cells:  Urinalysis    Component Value Date/Time   COLORURINE yellow 11/10/2009 1513   APPEARANCEUR Clear 11/10/2009 1513   LABSPEC 1.015 11/10/2009 1513   PHURINE 6.5 11/10/2009 1513   HGBUR negative 11/10/2009 1513   BILIRUBINUR small 05/11/2012 1458   BILIRUBINUR negative 11/10/2009 1513   UROBILINOGEN 1.0 05/11/2012 1458   UROBILINOGEN 0.2 11/10/2009 1513   NITRITE positive 05/11/2012  1458   NITRITE positive 11/10/2009 1513   LEUKOCYTESUR Negative 05/11/2012 1458    RADIOGRAPHIC STUDIES: No results found.  ASSESSMENT:  #1. 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 #2. H. pylori gastritis, currently on treatment, with superinfection with Candida. #3. Beta thalassemia trait. #4. Morbid obesity. #5. Metabolic syndrome. #6. Allergic rhinitis, asymptomatic at this time. #7. Steatosis of the liver on ultrasound.   PLAN:  #1. Patient was started on Diflucan 200 mg daily while taking antibiotics. #2. There exists no hematologic or oncologic contraindication to undergoing gastric bypass surgery. #3. Will reevaluate in 1 year to assess status of nutrition and make suitable recommendations regarding supplementation with parenteral B12, folate, or iron. #4. In the presence of her husband, the patient agreed with this strategy.   All questions were answered. The patient knows to call the clinic with any problems, questions or concerns. We can certainly see the patient much sooner if necessary.   I spent 25 minutes counseling the patient face to face. The total time spent in the appointment was 30 minutes.    Doroteo Bradford, MD 09/26/2013 2:03 PM

## 2013-09-26 NOTE — Patient Instructions (Signed)
[facility] Discharge Instructions  RECOMMENDATIONS MADE BY THE CONSULTANT AND ANY TEST RESULTS WILL BE SENT TO YOUR REFERRING PHYSICIAN.  EXAM FINDINGS BY THE PHYSICIAN TODAY AND SIGNS OR SYMPTOMS TO REPORT TO CLINIC OR PRIMARY PHYSICIAN: Exam and findings as discussed by Dr. Barnet Glasgow.  MEDICATIONS PRESCRIBED:  Diflucan - take as directed  INSTRUCTIONS/FOLLOW-UP: Follow-up with labs and MD visit in 1 year.  Thank you for choosing [facility] to provide your oncology and hematology care.  To afford each patient quality time with our providers, please arrive at least 15 minutes before your scheduled appointment time.  With your help, our goal is to use those 15 minutes to complete the necessary work-up to ensure our physicians have the information they need to help with your evaluation and healthcare recommendations.    Effective January 1st, 2014, we ask that you re-schedule your appointment with our physicians should you arrive 10 or more minutes late for your appointment.  We strive to give you quality time with our providers, and arriving late affects you and other patients whose appointments are after yours.    Again, thank you for choosing [facility].  Our hope is that these requests will decrease the amount of time that you wait before being seen by our physicians.       _____________________________________________________________  Should you have questions after your visit to [facility], please contact our office at [phone] between the hours of 8:30 a.m. and 5:00 p.m.  Voicemails left after 4:30 p.m. will not be returned until the following business day.  For prescription refill requests, have your pharmacy contact our office with your prescription refill request.

## 2013-09-28 NOTE — Telephone Encounter (Signed)
Reminder letter sent.

## 2013-10-15 ENCOUNTER — Ambulatory Visit
Admission: RE | Admit: 2013-10-15 | Discharge: 2013-10-15 | Disposition: A | Discharge status: Home / Self Care | Payer: BC Managed Care – PPO | Source: Ambulatory Visit

## 2013-10-15 DIAGNOSIS — Z1231 Encounter for screening mammogram for malignant neoplasm of breast: Secondary | ICD-10-CM

## 2013-10-17 ENCOUNTER — Encounter: Payer: Self-pay | Admitting: Family Medicine

## 2013-10-17 ENCOUNTER — Ambulatory Visit (INDEPENDENT_AMBULATORY_CARE_PROVIDER_SITE_OTHER): Payer: BC Managed Care – PPO | Admitting: Family Medicine

## 2013-10-17 VITALS — BP 126/84 | HR 66 | Resp 18 | Ht 62.0 in | Wt 306.1 lb

## 2013-10-17 DIAGNOSIS — E8881 Metabolic syndrome: Secondary | ICD-10-CM

## 2013-10-17 DIAGNOSIS — E559 Vitamin D deficiency, unspecified: Secondary | ICD-10-CM

## 2013-10-17 DIAGNOSIS — F411 Generalized anxiety disorder: Secondary | ICD-10-CM

## 2013-10-17 DIAGNOSIS — Z6841 Body Mass Index (BMI) 40.0 and over, adult: Secondary | ICD-10-CM

## 2013-10-17 DIAGNOSIS — I10 Essential (primary) hypertension: Secondary | ICD-10-CM

## 2013-10-17 DIAGNOSIS — K219 Gastro-esophageal reflux disease without esophagitis: Secondary | ICD-10-CM

## 2013-10-17 DIAGNOSIS — R7303 Prediabetes: Secondary | ICD-10-CM

## 2013-10-17 DIAGNOSIS — R7309 Other abnormal glucose: Secondary | ICD-10-CM

## 2013-10-17 MED ORDER — PHENTERMINE HCL 37.5 MG PO TABS: 37.5000 mg | ORAL_TABLET | Freq: Every day | ORAL | Status: DC

## 2013-10-17 MED ORDER — METFORMIN HCL 500 MG PO TABS: 500.0000 mg | ORAL_TABLET | Freq: Two times a day (BID) | ORAL | Status: DC

## 2013-10-17 NOTE — Patient Instructions (Signed)
F/u in 3.5 month, call if you need me before  New is phentermine half tablet daily at breakfast   New is metformin 1 twice daily  Weight loss expected is 4 pounds per month  Exercise for 30 mins every day    HBA1C, vit D , chem 7 today   hBA1c and chem 7 in 3.5 month before the vist

## 2013-10-18 ENCOUNTER — Encounter: Payer: Self-pay | Admitting: Family Medicine

## 2013-10-18 LAB — BASIC METABOLIC PANEL
BUN: 6 mg/dL (ref 6–23)
CALCIUM: 8.7 mg/dL (ref 8.4–10.5)
CO2: 28 mEq/L (ref 19–32)
Chloride: 105 mEq/L (ref 96–112)
Creat: 0.63 mg/dL (ref 0.50–1.10)
Glucose, Bld: 80 mg/dL (ref 70–99)
Potassium: 3.9 mEq/L (ref 3.5–5.3)
SODIUM: 136 meq/L (ref 135–145)

## 2013-10-18 LAB — HEMOGLOBIN A1C
HEMOGLOBIN A1C: 6.1 % — AB (ref <5.7)
MEAN PLASMA GLUCOSE: 128 mg/dL — AB (ref <117)

## 2013-10-18 LAB — VITAMIN D 25 HYDROXY (VIT D DEFICIENCY, FRACTURES): Vit D, 25-Hydroxy: 34 ng/mL (ref 30–89)

## 2013-10-22 NOTE — Assessment & Plan Note (Signed)
The increased risk of cardiovascular disease associated with this diagnosis, and the need to consistently work on lifestyle to change this is discussed. Following  a  heart healthy diet ,commitment to 30 minutes of exercise at least 5 days per week, as well as control of blood sugar and cholesterol , and achieving a healthy weight are all the areas to be addressed .  

## 2013-10-22 NOTE — Assessment & Plan Note (Signed)
Controlled, no change in medication  

## 2013-10-22 NOTE — Assessment & Plan Note (Signed)
Deteriorated. Patient re-educated about  the importance of commitment to a  minimum of 150 minutes of exercise per week. The importance of healthy food choices with portion control discussed. Encouraged to start a food diary, count calories and to consider  joining a support group. Sample diet sheets offered. Goals set by the patient for the next several months.  Pt to start phentermine half daily, expect 3 to 4 pound weight loss per month

## 2013-10-22 NOTE — Assessment & Plan Note (Signed)
detreriorated Increase metformin to 1000 mg daily. Patient educated about the importance of limiting  Carbohydrate intake , the need to commit to daily physical activity for a minimum of 30 minutes , and to commit weight loss. The fact that changes in all these areas will reduce or eliminate all together the development of diabetes is stressed.

## 2013-10-22 NOTE — Progress Notes (Signed)
   Subjective:    Patient ID: Holly Alvarado, female    DOB: [birth year], 48 y.o.   MRN: [id number]  HPI The PT is here for follow up and re-evaluation of chronic medical conditions, medication management and review of any available recent lab and radiology data.  Preventive health is updated, specifically  Cancer screening and Immunization.   Questions or concerns regarding consultations or procedures which the PT has had in the interim are  Addressed.Met with bariatric surgeon, plans surgery for the Summer of 2015, also needs to lose weight prior to surgery I believe. Wants to try to get help from phentermine for this The PT denies any adverse reactions to current medications since the last visit.  There are no new concerns. Still c/o unilateral leg swelling, no pain associated , present for over 6 months, negative imaging study for DVT There are no new complaints       Review of Systems See HPI Denies recent fever or chills. Denies sinus pressure, nasal congestion, ear pain or sore throat. Denies chest congestion, productive cough or wheezing. Denies chest pains, palpitations PND or orthopnea Denies abdominal pain, nausea, vomiting,diarrhea or constipation.   Denies dysuria, frequency, hesitancy or incontinence. C/o limitation in mobility due to joint pains and size Denies headaches, seizures, numbness, or tingling. Denies uncontrolled  depression, anxiety or insomnia. Denies skin break down or rash.        Objective:   Physical Exam  Patient alert and oriented and in no cardiopulmonary distress.  HEENT: No facial asymmetry, EOMI, no sinus tenderness,  oropharynx pink and moist.  Neck supple no adenopathy.  Chest: Clear to auscultation bilaterally.  CVS: S1, S2 no murmurs, no S3.  ABD: Soft non tender. Bowel sounds normal.  Ext: No edema  MS: Adequate ROM spine, shoulders, hips and knees.  Skin: Intact, no ulcerations or rash noted.  Psych: Good eye contact,  normal affect. Memory intact not anxious or depressed appearing.  CNS: CN 2-12 intact, power, tone and sensation normal throughout.       Assessment & Plan:

## 2013-10-22 NOTE — Assessment & Plan Note (Signed)
Controlled, no change in medication DASH diet and commitment to daily physical activity for a minimum of 30 minutes discussed and encouraged, as a part of hypertension management. The importance of attaining a healthy weight is also discussed.  

## 2013-10-23 ENCOUNTER — Other Ambulatory Visit: Payer: Self-pay | Admitting: Family Medicine

## 2013-10-24 ENCOUNTER — Other Ambulatory Visit: Payer: Self-pay

## 2013-10-24 MED ORDER — METOPROLOL SUCCINATE ER 50 MG PO TB24: ORAL_TABLET | ORAL | Status: DC

## 2013-11-21 ENCOUNTER — Encounter (INDEPENDENT_AMBULATORY_CARE_PROVIDER_SITE_OTHER): Payer: Self-pay | Admitting: *Deleted

## 2013-11-21 ENCOUNTER — Other Ambulatory Visit (INDEPENDENT_AMBULATORY_CARE_PROVIDER_SITE_OTHER): Payer: Self-pay | Admitting: *Deleted

## 2013-11-21 DIAGNOSIS — R748 Abnormal levels of other serum enzymes: Secondary | ICD-10-CM

## 2013-12-12 ENCOUNTER — Ambulatory Visit (INDEPENDENT_AMBULATORY_CARE_PROVIDER_SITE_OTHER): Payer: BC Managed Care – PPO | Admitting: Internal Medicine

## 2013-12-19 ENCOUNTER — Encounter: Payer: Self-pay | Admitting: Family Medicine

## 2014-01-22 ENCOUNTER — Encounter: Payer: Self-pay | Admitting: Family Medicine

## 2014-01-22 ENCOUNTER — Ambulatory Visit (INDEPENDENT_AMBULATORY_CARE_PROVIDER_SITE_OTHER): Payer: BC Managed Care – PPO | Admitting: Family Medicine

## 2014-01-22 ENCOUNTER — Other Ambulatory Visit: Payer: Self-pay | Admitting: Family Medicine

## 2014-01-22 VITALS — BP 134/82 | HR 85 | Resp 18 | Ht 62.0 in | Wt 307.0 lb

## 2014-01-22 DIAGNOSIS — N3000 Acute cystitis without hematuria: Secondary | ICD-10-CM

## 2014-01-22 DIAGNOSIS — I10 Essential (primary) hypertension: Secondary | ICD-10-CM

## 2014-01-22 DIAGNOSIS — R7309 Other abnormal glucose: Secondary | ICD-10-CM

## 2014-01-22 DIAGNOSIS — F411 Generalized anxiety disorder: Secondary | ICD-10-CM

## 2014-01-22 DIAGNOSIS — J301 Allergic rhinitis due to pollen: Secondary | ICD-10-CM

## 2014-01-22 DIAGNOSIS — F32A Depression, unspecified: Secondary | ICD-10-CM

## 2014-01-22 DIAGNOSIS — Z6841 Body Mass Index (BMI) 40.0 and over, adult: Secondary | ICD-10-CM

## 2014-01-22 DIAGNOSIS — R7303 Prediabetes: Secondary | ICD-10-CM

## 2014-01-22 DIAGNOSIS — F329 Major depressive disorder, single episode, unspecified: Secondary | ICD-10-CM

## 2014-01-22 DIAGNOSIS — E8881 Metabolic syndrome: Secondary | ICD-10-CM

## 2014-01-22 DIAGNOSIS — F3289 Other specified depressive episodes: Secondary | ICD-10-CM

## 2014-01-22 LAB — POCT URINALYSIS DIPSTICK
Blood, UA: NEGATIVE
Glucose, UA: NEGATIVE
KETONES UA: NEGATIVE
Leukocytes, UA: NEGATIVE
Nitrite, UA: NEGATIVE
PH UA: 6.5
PROTEIN UA: NEGATIVE
Urobilinogen, UA: >=8

## 2014-01-22 MED ORDER — VENLAFAXINE HCL ER 75 MG PO CP24: ORAL_CAPSULE | ORAL | Status: DC

## 2014-01-22 MED ORDER — ESOMEPRAZOLE MAGNESIUM 40 MG PO CPDR: 40.0000 mg | DELAYED_RELEASE_CAPSULE | Freq: Every day | ORAL | Status: DC

## 2014-01-22 MED ORDER — METOPROLOL SUCCINATE ER 50 MG PO TB24: ORAL_TABLET | ORAL | Status: DC

## 2014-01-22 MED ORDER — ALPRAZOLAM 0.25 MG PO TABS: 0.2500 mg | ORAL_TABLET | Freq: Every evening | ORAL | Status: DC | PRN

## 2014-01-22 NOTE — Patient Instructions (Addendum)
F/u in 4 month, call if you need me before   HBa1C cmp, TSH today, we will call you about labs so you know what to do about your metformin  All the best with upcoming surgery  Blood pressure and mood is good , you are ready for your surgery  Stop benadryl and resume loratidine for allergies  Urine today shows no infection

## 2014-01-23 LAB — COMPREHENSIVE METABOLIC PANEL
ALK PHOS: 122 U/L — AB (ref 39–117)
ALT: 72 U/L — AB (ref 0–35)
AST: 129 U/L — ABNORMAL HIGH (ref 0–37)
Albumin: 3.4 g/dL — ABNORMAL LOW (ref 3.5–5.2)
BUN: 7 mg/dL (ref 6–23)
CO2: 27 mEq/L (ref 19–32)
Calcium: 8.9 mg/dL (ref 8.4–10.5)
Chloride: 103 mEq/L (ref 96–112)
Creat: 0.69 mg/dL (ref 0.50–1.10)
Glucose, Bld: 96 mg/dL (ref 70–99)
Potassium: 4 mEq/L (ref 3.5–5.3)
SODIUM: 137 meq/L (ref 135–145)
TOTAL PROTEIN: 8 g/dL (ref 6.0–8.3)
Total Bilirubin: 0.8 mg/dL (ref 0.2–1.2)

## 2014-01-23 LAB — HEMOGLOBIN A1C
Hgb A1c MFr Bld: 6.1 % — ABNORMAL HIGH (ref <5.7)
MEAN PLASMA GLUCOSE: 128 mg/dL — AB (ref <117)

## 2014-01-23 LAB — TSH: TSH: 0.9 u[IU]/mL (ref 0.350–4.500)

## 2014-01-24 LAB — VITAMIN D 25 HYDROXY (VIT D DEFICIENCY, FRACTURES): Vit D, 25-Hydroxy: 29 ng/mL — ABNORMAL LOW (ref 30–89)

## 2014-01-24 LAB — HEPATITIS PANEL, ACUTE
HCV Ab: NEGATIVE
HEP A IGM: NONREACTIVE
HEP B S AG: NEGATIVE
Hep B C IgM: NONREACTIVE

## 2014-01-25 ENCOUNTER — Other Ambulatory Visit: Payer: Self-pay

## 2014-01-25 MED ORDER — VITAMIN D (ERGOCALCIFEROL) 1.25 MG (50000 UNIT) PO CAPS: 50000.0000 [IU] | ORAL_CAPSULE | ORAL | Status: DC

## 2014-01-28 ENCOUNTER — Other Ambulatory Visit: Payer: Self-pay

## 2014-01-30 ENCOUNTER — Ambulatory Visit: Payer: BC Managed Care – PPO | Admitting: Family Medicine

## 2014-02-06 ENCOUNTER — Ambulatory Visit (INDEPENDENT_AMBULATORY_CARE_PROVIDER_SITE_OTHER): Payer: BC Managed Care – PPO | Admitting: Surgery

## 2014-02-06 ENCOUNTER — Encounter (INDEPENDENT_AMBULATORY_CARE_PROVIDER_SITE_OTHER): Payer: Self-pay | Admitting: Surgery

## 2014-02-06 DIAGNOSIS — R7309 Other abnormal glucose: Secondary | ICD-10-CM

## 2014-02-06 DIAGNOSIS — I10 Essential (primary) hypertension: Secondary | ICD-10-CM

## 2014-02-06 DIAGNOSIS — K219 Gastro-esophageal reflux disease without esophagitis: Secondary | ICD-10-CM

## 2014-02-06 DIAGNOSIS — E119 Type 2 diabetes mellitus without complications: Secondary | ICD-10-CM

## 2014-02-06 DIAGNOSIS — R7303 Prediabetes: Secondary | ICD-10-CM

## 2014-02-06 NOTE — Patient Instructions (Signed)

## 2014-02-06 NOTE — Progress Notes (Signed)
Chief Complaint:  Morbid obesity BMI 56  History of Present Illness:  Holly Alvarado is an 48 y.o. female who has been to one of our seminars and is interested in Roux-en-Y gastric bypass. She has recently been diagnosed with diabetes and is to begin her metformin.  She has been doing research on the Internet and is not interested in a sleeve. She is aware of the risk and benefits of her Y. Gastric bypass and once to proceed.  She has had a positive H. Pylori x2 and has been treated. She has been worked up for obstructive sleep apnea numerous times and is negative. She has had a prior cholecystectomy and tubal ligation. 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  Past Medical History  Diagnosis Date  . Allergic rhinitis, seasonal   . Anxiety disorder   . GERD (gastroesophageal reflux disease)   . Obesity   . Hypertension   . Chronic constipation   . Hemorrhoids   . Esophageal polyp   . Fatty liver     Past Surgical History  Procedure Laterality Date  . Ectopic pregnancy surgery  1994, 1995  . Blt  1987  . Cholecystectomy  1997  . Total abdominal hysterectomy  03/11/08    for fibroids   . Mouth surgery      wisdom tooth extraction  . Partial hysterectomy    . Colonoscopy  12/30/2010  . Upper gastrointestinal endoscopy  12/30/2010  . Upper gastrointestinal endoscopy  05/11/04  . Upper gastrointestinal endoscopy  04/30/97    ANWAR  . Breast biopsy Right 2010    benign    Current Outpatient Prescriptions  Medication Sig Dispense Refill  . ALPRAZolam (XANAX) 0.25 MG tablet Take 1 tablet (0.25 mg total) by mouth at bedtime as needed.  30 tablet  4  . aspirin (ASPIRIN LOW DOSE) 81 MG EC tablet Take 81 mg by mouth daily. Take one tablet by mouth daily       . esomeprazole (NEXIUM) 40 MG capsule Take 1 capsule (40 mg total) by mouth daily.  30 capsule  4  . ibuprofen (ADVIL) 200 MG tablet Take 200 mg by mouth every 6 (six) hours as needed.      . metFORMIN (GLUCOPHAGE) 500 MG  tablet Take 1 tablet (500 mg total) by mouth 2 (two) times daily with a meal.  60 tablet  5  . metoprolol succinate (TOPROL-XL) 50 MG 24 hr tablet TAKE WITH OR IMMEDIATELY FOLLOWING A MEAL.TAKE 1 TABLET BY MOUTH TWICE A DAY  60 tablet  4  . venlafaxine XR (EFFEXOR-XR) 75 MG 24 hr capsule TAKE ONE CAPSULE BY MOUTH EVERY DAY  30 capsule  4  . Vitamin D, Ergocalciferol, (DRISDOL) 50000 UNITS CAPS capsule Take 1 capsule (50,000 Units total) by mouth every 7 (seven) days.  4 capsule  5  . fluticasone (FLONASE) 50 MCG/ACT nasal spray Place 2 sprays into the nose daily.  16 g  2  . loratadine (CLARITIN) 10 MG tablet Take 1 tablet (10 mg total) by mouth daily.  30 tablet  3   No current facility-administered medications for this visit.   Sulfonamide derivatives Family History  Problem Relation Age of Onset  . Hypertension Mother   . Hyperlipidemia Father   . Hypertension Father   . Hyperlipidemia Sister   . Hypertension Sister   . Hyperlipidemia Sister   . Hypertension Sister   . Diabetes      family history   . Asthma  family history   . Arthritis      family history    Social History:   reports that she has never smoked. She has never used smokeless tobacco. She reports that she does not drink alcohol or use illicit drugs.   REVIEW OF SYSTEMS - PERTINENT POSITIVES ONLY: Positive for nasal congestion, left leg swelling, and constipation.  Physical Exam:   Blood pressure 126/86, pulse 61, temperature 98.5 F (36.9 C), height 5' 2"  (1.575 m), weight 307 lb (139.254 kg). Body mass index is 56.14 kg/(m^2).  Gen:  WDWN African American female NAD  Neurological: Alert and oriented to person, place, and time. Motor and sensory function is grossly intact  Head: Normocephalic and atraumatic.  Eyes: Conjunctivae are normal. Pupils are equal, round, and reactive to light. No scleral icterus.  Neck: Normal range of motion. Neck supple. No tracheal deviation or thyromegaly present.   Cardiovascular:  SR without murmurs or gallops.  No carotid bruits Respiratory: Effort normal.  No respiratory distress. No chest wall tenderness. Breath sounds normal.  No wheezes, rales or rhonchi.  Abdomen:  nontender GU: Musculoskeletal: Normal range of motion. Extremities are nontender. No cyanosis, edema or clubbing noted Lymphadenopathy: No cervical, preauricular, postauricular or axillary adenopathy is present Skin: Skin is warm and dry. No rash noted. No diaphoresis. No erythema. No pallor. Pscyh: Normal mood and affect. Behavior is normal. Judgment and thought content normal.   LABORATORY RESULTS: No results found for this or any previous visit (from the past 48 hour(s)).  RADIOLOGY RESULTS: No results found.  Problem List: Patient Active Problem List   Diagnosis Date Noted  . Lymphocytosis 09/10/2013  . Colon polyps,hyperplastic, sigmoid 09/10/2013  . Steatosis of liver 09/10/2013  . 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 Positive serology for Helicobacter pylori 88/07/314  . Abdominal pain, epigastric 08/13/2013  . Metabolic syndrome X 94/58/5929  . Pain of right great toe 05/17/2013  . Elevated LFTs 05/15/2013  . Unspecified vitamin D deficiency 05/11/2013  . Prediabetes 05/10/2013  . Depression 08/07/2012  . Joint pain 07/25/2012  . Carotid bruit 10/07/2011  . Trigger thumb 02/03/2011  . ABNORMAL MAMMOGRAM 07/12/2010  . Left ankle pain 06/22/2009  . TRIGGER FINGER 08/07/2008  . HYPERTENSION 06/18/2008  . Morbid obesity with BMI of 50.0-59.9, adult 02/20/2008  . GENERALIZED ANXIETY DISORDER 02/20/2008  . ALLERGIC RHINITIS, SEASONAL 02/20/2008  . GERD 02/20/2008    Assessment & Plan: Morbid obesity BMI 56 inches and Roux-en-Y gastric bypass and a newly treated diabetic. Will proceed with that workup.    Matt B. Hassell Done, MD, [facility] [phone] beeper [phone]  02/06/2014 11:52 AM

## 2014-02-06 NOTE — Addendum Note (Signed)
Addended by: Ivor Costa on: 02/06/2014 04:03 PM   Modules accepted: Orders

## 2014-02-25 ENCOUNTER — Ambulatory Visit (HOSPITAL_COMMUNITY)
Admission: RE | Admit: 2014-02-25 | Discharge: 2014-02-25 | Disposition: A | Discharge status: Home / Self Care | Payer: BC Managed Care – PPO | Source: Ambulatory Visit | Attending: Surgery | Admitting: Surgery

## 2014-02-25 ENCOUNTER — Encounter (HOSPITAL_COMMUNITY)
Admission: RE | Disposition: A | Discharge status: Home / Self Care | Payer: Self-pay | Source: Ambulatory Visit | Attending: Surgery

## 2014-02-25 HISTORY — PX: BREATH TEK H PYLORI: SHX5422

## 2014-02-25 SURGERY — BREATH TEST, FOR HELICOBACTER PYLORI

## 2014-02-26 ENCOUNTER — Encounter (HOSPITAL_COMMUNITY): Payer: Self-pay | Admitting: Surgery

## 2014-03-11 ENCOUNTER — Ambulatory Visit (HOSPITAL_COMMUNITY)
Admission: RE | Admit: 2014-03-11 | Discharge: 2014-03-11 | Disposition: A | Discharge status: Home / Self Care | Payer: BC Managed Care – PPO | Source: Ambulatory Visit | Attending: Surgery | Admitting: Surgery

## 2014-03-11 ENCOUNTER — Other Ambulatory Visit: Payer: Self-pay

## 2014-03-11 DIAGNOSIS — K219 Gastro-esophageal reflux disease without esophagitis: Secondary | ICD-10-CM | POA: Diagnosis not present

## 2014-03-11 DIAGNOSIS — K5900 Constipation, unspecified: Secondary | ICD-10-CM | POA: Diagnosis not present

## 2014-03-11 DIAGNOSIS — K7689 Other specified diseases of liver: Secondary | ICD-10-CM | POA: Diagnosis not present

## 2014-03-11 DIAGNOSIS — R7303 Prediabetes: Secondary | ICD-10-CM

## 2014-03-11 DIAGNOSIS — K2289 Other specified disease of esophagus: Secondary | ICD-10-CM | POA: Diagnosis not present

## 2014-03-11 DIAGNOSIS — E119 Type 2 diabetes mellitus without complications: Secondary | ICD-10-CM | POA: Diagnosis not present

## 2014-03-11 DIAGNOSIS — K228 Other specified diseases of esophagus: Secondary | ICD-10-CM | POA: Insufficient documentation

## 2014-03-11 DIAGNOSIS — R0602 Shortness of breath: Secondary | ICD-10-CM | POA: Insufficient documentation

## 2014-03-11 DIAGNOSIS — Z6841 Body Mass Index (BMI) 40.0 and over, adult: Secondary | ICD-10-CM | POA: Diagnosis not present

## 2014-03-11 DIAGNOSIS — I10 Essential (primary) hypertension: Secondary | ICD-10-CM | POA: Insufficient documentation

## 2014-03-11 DIAGNOSIS — K449 Diaphragmatic hernia without obstruction or gangrene: Secondary | ICD-10-CM | POA: Insufficient documentation

## 2014-03-11 DIAGNOSIS — Z01818 Encounter for other preprocedural examination: Secondary | ICD-10-CM | POA: Insufficient documentation

## 2014-03-25 ENCOUNTER — Ambulatory Visit: Payer: BC Managed Care – PPO | Admitting: Dietician

## 2014-03-30 DIAGNOSIS — N3000 Acute cystitis without hematuria: Secondary | ICD-10-CM | POA: Insufficient documentation

## 2014-03-30 NOTE — Assessment & Plan Note (Signed)
Controlled, no change in medication DASH diet and commitment to daily physical activity for a minimum of 30 minutes discussed and encouraged, as a part of hypertension management. The importance of attaining a healthy weight is also discussed.  

## 2014-03-30 NOTE — Assessment & Plan Note (Signed)
The increased risk of cardiovascular disease associated with this diagnosis, and the need to consistently work on lifestyle to change this is discussed. Following  a  heart healthy diet ,commitment to 30 minutes of exercise at least 5 days per week, as well as control of blood sugar and cholesterol , and achieving a healthy weight are all the areas to be addressed .  

## 2014-03-30 NOTE — Assessment & Plan Note (Signed)
Controlled, no change in medication  

## 2014-03-30 NOTE — Assessment & Plan Note (Signed)
unchanged Patient educated about the importance of limiting  Carbohydrate intake , the need to commit to daily physical activity for a minimum of 30 minutes , and to commit weight loss. The fact that changes in all these areas will reduce or eliminate all together the development of diabetes is stressed.

## 2014-03-30 NOTE — Assessment & Plan Note (Addendum)
Unchanged Patient re-educated about  the importance of commitment to a  minimum of 150 minutes of exercise per week. The importance of healthy food choices with portion control discussed. Encouraged to start a food diary, count calories and to consider  joining a support group. Plans to have bariatric surgery in the near future , which she needs and will benefit from

## 2014-03-30 NOTE — Assessment & Plan Note (Signed)
Uncontrolled with headache. Stop oTC med and start daly claritin and flonase, also netty pot flushes twice daily

## 2014-03-30 NOTE — Progress Notes (Signed)
Subjective:    Patient ID: Holly Alvarado, female    DOB: [birth year], 48 y.o.   MRN: [id number]  HPI The PT is here for follow up and re-evaluation of chronic medical conditions, medication management and review of any available recent lab and radiology data.  Preventive health is updated, specifically  Cancer screening and Immunization.   Treated last week for UTI, still experiencing intermittent lower abdominal pain and requests re check for UTI. Denies fever, flank pain or nausea, no hematuria The PT denies any adverse reactions to current medications since the last visit.  C/o daily headaches in past several weeks , has been using OTC allergy med and feels as though less controlled, will start daily flushes and prescription medication Is mentaly prepared for gastric bypass in the near future, she has remained unsuccessful as far as weight loss is  concerned    Review of Systems See HPI Denies recent fever or chills. Denies , ear pain or sore throat.c/o increased sinus pressure , nasal congestion with clear drainage Denies chest congestion, productive cough or wheezing. Denies chest pains, palpitations and leg swelling Denies nausea, vomiting,diarrhea or constipation.    Denies joint pain, swelling and limitation in mobility. Denies , seizures, numbness, or tingling.Intermittent frontal headache Denies  Uncontrolled depression, anxiety or insomnia. Denies skin break down or rash.        Objective:   Physical Exam  BP 134/82  Pulse 85  Resp 18  Ht 5' 2"  (1.575 m)  Wt 307 lb (139.254 kg)  BMI 56.14 kg/m2  SpO2 98% Patient alert and oriented and in no cardiopulmonary distress.  HEENT: No facial asymmetry, EOMI,   oropharynx pink and moist.  Neck supple no JVD, no mass. Frontal sinus mildly tender, erythema and edema of nasal mucosa Chest: Clear to auscultation bilaterally.  CVS: S1, S2 no murmurs, no S3.  ABD: Soft non tender. No renal angle or suprapubic  tenderness, normal BS UA negative  Ext: No edema  MS: Adequate ROM spine, shoulders, hips and knees.  Skin: Intact, no ulcerations or rash noted.  Psych: Good eye contact, normal affect. Memory intact not anxious or depressed appearing.  CNS: CN 2-12 intact, power,  normal throughout.no focal deficits noted.       Assessment & Plan:  HYPERTENSION Controlled, no change in medication DASH diet and commitment to daily physical activity for a minimum of 30 minutes discussed and encouraged, as a part of hypertension management. The importance of attaining a healthy weight is also discussed.   ALLERGIC RHINITIS, SEASONAL Uncontrolled with headache. Stop oTC med and start daly claritin and flonase, also netty pot flushes twice daily  Prediabetes unchanged Patient educated about the importance of limiting  Carbohydrate intake , the need to commit to daily physical activity for a minimum of 30 minutes , and to commit weight loss. The fact that changes in all these areas will reduce or eliminate all together the development of diabetes is stressed.     GENERALIZED ANXIETY DISORDER Controlled, no change in medication   Metabolic syndrome X The increased risk of cardiovascular disease associated with this diagnosis, and the need to consistently work on lifestyle to change this is discussed. Following  a  heart healthy diet ,commitment to 30 minutes of exercise at least 5 days per week, as well as control of blood sugar and cholesterol , and achieving a healthy weight are all the areas to be addressed .   Morbid obesity with BMI of 50.0-59.9,  adult Unchanged Patient re-educated about  the importance of commitment to a  minimum of 150 minutes of exercise per week. The importance of healthy food choices with portion control discussed. Encouraged to start a food diary, count calories and to consider  joining a support group. Plans to have bariatric surgery in the near future , which  she needs and will benefit from   Depression Controlled, no change in medication   Acute cystitis Remains mildly symptomatic despte recent treatment will re check UA in office

## 2014-03-30 NOTE — Assessment & Plan Note (Signed)
Remains mildly symptomatic despte recent treatment will re check UA in office

## 2014-05-29 ENCOUNTER — Ambulatory Visit: Payer: BC Managed Care – PPO | Admitting: Family Medicine

## 2014-05-30 ENCOUNTER — Encounter: Payer: Self-pay | Admitting: Dietician

## 2014-05-30 ENCOUNTER — Encounter: Payer: BC Managed Care – PPO | Attending: Surgery | Admitting: Dietician

## 2014-05-30 ENCOUNTER — Ambulatory Visit: Payer: BC Managed Care – PPO | Admitting: Family Medicine

## 2014-05-30 VITALS — Ht 62.0 in | Wt 313.0 lb

## 2014-05-30 DIAGNOSIS — Z6841 Body Mass Index (BMI) 40.0 and over, adult: Secondary | ICD-10-CM | POA: Insufficient documentation

## 2014-05-30 DIAGNOSIS — Z01818 Encounter for other preprocedural examination: Secondary | ICD-10-CM | POA: Insufficient documentation

## 2014-05-30 DIAGNOSIS — Z713 Dietary counseling and surveillance: Secondary | ICD-10-CM | POA: Insufficient documentation

## 2014-05-30 NOTE — Progress Notes (Signed)
  Pre-Op Assessment Visit:  Pre-Operative RYGB Surgery  Medical Nutrition Therapy:  Appt start time: 2583   End time:  4621.  Patient was seen on 05/30/2014 for Pre-Operative RYGB Nutrition Assessment. Assessment and letter of approval faxed to [facility] Bariatric Surgery Program coordinator on 05/30/2014.   Preferred Learning Style:   No preference indicated   Learning Readiness:   Ready  Handouts given during visit include:  Pre-Op Goals Bariatric Surgery Protein Shakes  Teaching Method Utilized:  Visual Auditory  Barriers to learning/adherence to lifestyle change: none  Demonstrated degree of understanding via:  Teach Back   Patient to call the [facility] to enroll in Pre-Op and Post-Op Nutrition Education when surgery date is scheduled.

## 2014-06-05 ENCOUNTER — Telehealth: Payer: Self-pay | Admitting: Family Medicine

## 2014-06-05 NOTE — Telephone Encounter (Signed)
Letter drafted for dr to sign

## 2014-06-12 ENCOUNTER — Encounter: Payer: Self-pay | Admitting: Family Medicine

## 2014-06-12 ENCOUNTER — Ambulatory Visit (INDEPENDENT_AMBULATORY_CARE_PROVIDER_SITE_OTHER): Payer: BC Managed Care – PPO | Admitting: Family Medicine

## 2014-06-12 VITALS — BP 134/76 | HR 100 | Resp 18 | Ht 62.0 in | Wt 312.0 lb

## 2014-06-12 DIAGNOSIS — F411 Generalized anxiety disorder: Secondary | ICD-10-CM

## 2014-06-12 DIAGNOSIS — R7303 Prediabetes: Secondary | ICD-10-CM

## 2014-06-12 DIAGNOSIS — I10 Essential (primary) hypertension: Secondary | ICD-10-CM

## 2014-06-12 DIAGNOSIS — L218 Other seborrheic dermatitis: Secondary | ICD-10-CM

## 2014-06-12 DIAGNOSIS — J301 Allergic rhinitis due to pollen: Secondary | ICD-10-CM

## 2014-06-12 DIAGNOSIS — E8881 Metabolic syndrome: Secondary | ICD-10-CM

## 2014-06-12 DIAGNOSIS — R7309 Other abnormal glucose: Secondary | ICD-10-CM

## 2014-06-12 DIAGNOSIS — L219 Seborrheic dermatitis, unspecified: Secondary | ICD-10-CM

## 2014-06-12 DIAGNOSIS — B350 Tinea barbae and tinea capitis: Secondary | ICD-10-CM

## 2014-06-12 DIAGNOSIS — Z6841 Body Mass Index (BMI) 40.0 and over, adult: Secondary | ICD-10-CM

## 2014-06-12 NOTE — Patient Instructions (Signed)
F/u in 4 month, call if you need me before  All the very best with your upcoming surgery, this is the right decision

## 2014-06-13 DIAGNOSIS — L219 Seborrheic dermatitis, unspecified: Secondary | ICD-10-CM | POA: Insufficient documentation

## 2014-06-13 DIAGNOSIS — B350 Tinea barbae and tinea capitis: Secondary | ICD-10-CM | POA: Insufficient documentation

## 2014-06-13 MED ORDER — CLOBETASOL PROPIONATE 0.05 % EX SHAM: MEDICATED_SHAMPOO | CUTANEOUS | Status: DC

## 2014-06-13 MED ORDER — CLOTRIMAZOLE-BETAMETHASONE 1-0.05 % EX CREA: TOPICAL_CREAM | CUTANEOUS | Status: DC

## 2014-06-13 NOTE — Assessment & Plan Note (Signed)
Rash in posterior right scalp diameter approx 3.5 cm Use topical prep for 10 days then as needed

## 2014-07-04 ENCOUNTER — Emergency Department (HOSPITAL_COMMUNITY)
Admission: EM | Admit: 2014-07-04 | Discharge: 2014-07-04 | Disposition: A | Discharge status: Home / Self Care | Payer: BC Managed Care – PPO | Attending: Emergency Medicine | Admitting: Emergency Medicine

## 2014-07-04 ENCOUNTER — Emergency Department (HOSPITAL_COMMUNITY): Payer: BC Managed Care – PPO

## 2014-07-04 ENCOUNTER — Encounter (HOSPITAL_COMMUNITY): Payer: Self-pay | Admitting: Emergency Medicine

## 2014-07-04 DIAGNOSIS — F419 Anxiety disorder, unspecified: Secondary | ICD-10-CM | POA: Diagnosis not present

## 2014-07-04 DIAGNOSIS — M7989 Other specified soft tissue disorders: Secondary | ICD-10-CM | POA: Insufficient documentation

## 2014-07-04 DIAGNOSIS — Z7952 Long term (current) use of systemic steroids: Secondary | ICD-10-CM | POA: Diagnosis not present

## 2014-07-04 DIAGNOSIS — Z7982 Long term (current) use of aspirin: Secondary | ICD-10-CM | POA: Diagnosis not present

## 2014-07-04 DIAGNOSIS — I10 Essential (primary) hypertension: Secondary | ICD-10-CM | POA: Diagnosis not present

## 2014-07-04 DIAGNOSIS — R55 Syncope and collapse: Secondary | ICD-10-CM | POA: Insufficient documentation

## 2014-07-04 DIAGNOSIS — K219 Gastro-esophageal reflux disease without esophagitis: Secondary | ICD-10-CM | POA: Insufficient documentation

## 2014-07-04 DIAGNOSIS — E119 Type 2 diabetes mellitus without complications: Secondary | ICD-10-CM | POA: Insufficient documentation

## 2014-07-04 DIAGNOSIS — R079 Chest pain, unspecified: Secondary | ICD-10-CM | POA: Insufficient documentation

## 2014-07-04 DIAGNOSIS — R51 Headache: Secondary | ICD-10-CM | POA: Diagnosis not present

## 2014-07-04 DIAGNOSIS — R200 Anesthesia of skin: Secondary | ICD-10-CM | POA: Diagnosis not present

## 2014-07-04 DIAGNOSIS — Z79899 Other long term (current) drug therapy: Secondary | ICD-10-CM | POA: Insufficient documentation

## 2014-07-04 DIAGNOSIS — E669 Obesity, unspecified: Secondary | ICD-10-CM | POA: Insufficient documentation

## 2014-07-04 DIAGNOSIS — R202 Paresthesia of skin: Secondary | ICD-10-CM

## 2014-07-04 DIAGNOSIS — R61 Generalized hyperhidrosis: Secondary | ICD-10-CM | POA: Diagnosis not present

## 2014-07-04 LAB — CBC WITH DIFFERENTIAL/PLATELET
BASOS ABS: 0 10*3/uL (ref 0.0–0.1)
BASOS PCT: 1 % (ref 0–1)
EOS ABS: 0.1 10*3/uL (ref 0.0–0.7)
Eosinophils Relative: 2 % (ref 0–5)
HCT: 42.8 % (ref 36.0–46.0)
HEMOGLOBIN: 14.2 g/dL (ref 12.0–15.0)
Lymphocytes Relative: 68 % — ABNORMAL HIGH (ref 12–46)
Lymphs Abs: 4.1 10*3/uL — ABNORMAL HIGH (ref 0.7–4.0)
MCH: 24.1 pg — AB (ref 26.0–34.0)
MCHC: 33.2 g/dL (ref 30.0–36.0)
MCV: 72.5 fL — ABNORMAL LOW (ref 78.0–100.0)
Monocytes Absolute: 0.5 10*3/uL (ref 0.1–1.0)
Monocytes Relative: 9 % (ref 3–12)
NEUTROS PCT: 21 % — AB (ref 43–77)
Neutro Abs: 1.3 10*3/uL — ABNORMAL LOW (ref 1.7–7.7)
PLATELETS: 162 10*3/uL (ref 150–400)
RBC: 5.9 MIL/uL — ABNORMAL HIGH (ref 3.87–5.11)
RDW: 15.1 % (ref 11.5–15.5)
WBC: 6 10*3/uL (ref 4.0–10.5)

## 2014-07-04 LAB — COMPREHENSIVE METABOLIC PANEL
ALBUMIN: 3 g/dL — AB (ref 3.5–5.2)
ALK PHOS: 166 U/L — AB (ref 39–117)
ALT: 67 U/L — AB (ref 0–35)
AST: 159 U/L — AB (ref 0–37)
Anion gap: 9 (ref 5–15)
BILIRUBIN TOTAL: 0.9 mg/dL (ref 0.3–1.2)
BUN: 6 mg/dL (ref 6–23)
CHLORIDE: 103 meq/L (ref 96–112)
CO2: 26 mEq/L (ref 19–32)
Calcium: 8.7 mg/dL (ref 8.4–10.5)
Creatinine, Ser: 0.69 mg/dL (ref 0.50–1.10)
GFR calc Af Amer: >90 mL/min (ref >90)
GFR calc non Af Amer: >90 mL/min (ref >90)
Glucose, Bld: 85 mg/dL (ref 70–99)
POTASSIUM: 3.7 meq/L (ref 3.7–5.3)
SODIUM: 138 meq/L (ref 137–147)
TOTAL PROTEIN: 9.2 g/dL — AB (ref 6.0–8.3)

## 2014-07-04 LAB — TROPONIN I: Troponin I: <0.3 ng/mL (ref <0.30)

## 2014-07-04 NOTE — ED Provider Notes (Signed)
CSN: [id number]     Arrival date & time 07/04/14  1151 History  This chart was scribed for Fredia Sorrow, MD by Zola Button, ED Scribe. This patient was seen in room APA19/APA19 and the patient's care was started at 1:15 PM.     No chief complaint on file.     Patient is a 48 y.o. female presenting with near-syncope. The history is provided by the patient. No language interpreter was used.  Near Syncope This is a new problem. The current episode started 3 to 5 hours ago. Associated symptoms include chest pain and headaches. Pertinent negatives include no abdominal pain and no shortness of breath. Nothing aggravates the symptoms. Nothing relieves the symptoms. She has tried nothing for the symptoms.   HPI Comments: Holly Alvarado is a 48 y.o. female with a hx of HTN who presents to the Emergency Department complaining of numbness and tingling on her tongue and lips. She notes associated HA that began when she got to the ED and an episode of dizziness and diaphoresis that occurred at 11am today. She has not had anything to eat since experiencing symptoms. She states that her tongue is fine now and her lips are feeling a bit better. Her BP was at 178/80 earlier today, but is now 139/67. 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 Patient states that her BP has never been this high and denies prior similar episodes. She denies pain anywhere else in her body. Last night, she laid down, turned on her left side and felt an episode of chest tightness that lasted a few seconds. She states she does not have CP today. She denies difficulty with speech and vision. Patient is pre-diabetic and is currently on Metformin. She states she is able to take Dilaudid. Patient currently works as a Pharmacist, hospital.  PCP: Dr. Moshe Cipro   Past Medical History  Diagnosis Date  . Allergic rhinitis, seasonal   . Anxiety disorder   . GERD (gastroesophageal reflux disease)   . Obesity   . Hypertension   . Chronic constipation    . Hemorrhoids   . Esophageal polyp   . Fatty liver   . Diabetes mellitus without complication    Past Surgical History  Procedure Laterality Date  . Ectopic pregnancy surgery  1994, 1995  . Blt  1987  . Cholecystectomy  1997  . Total abdominal hysterectomy  03/11/08    for fibroids   . Mouth surgery      wisdom tooth extraction  . Partial hysterectomy    . Colonoscopy  12/30/2010  . Upper gastrointestinal endoscopy  12/30/2010  . Upper gastrointestinal endoscopy  05/11/04  . Upper gastrointestinal endoscopy  04/30/97    ANWAR  . Breast biopsy Right 2010    benign  . Breath tek h pylori N/A 02/25/2014    Procedure: BREATH TEK H PYLORI;  Surgeon: Pedro Earls, MD;  Location: [facility];  Service: General;  Laterality: N/A;   Family History  Problem Relation Age of Onset  . Hypertension Mother   . COPD Mother   . Hyperlipidemia Father   . Hypertension Father   . Hyperlipidemia Sister   . Hypertension Sister   . Hyperlipidemia Sister   . Hypertension Sister   . Diabetes      family history   . Asthma      family history   . Arthritis      family history   . Stroke Other   . Cancer Other   .  Heart disease Other    History  Substance Use Topics  . Smoking status: Never Smoker   . Smokeless tobacco: Never Used  . Alcohol Use: No   OB History   Grav Para Term Preterm Abortions TAB SAB Ect Mult Living                 Review of Systems  Constitutional: Positive for diaphoresis. Negative for fever and chills.  HENT: Positive for sinus pressure.   Eyes: Negative for visual disturbance.  Respiratory: Negative for cough and shortness of breath.   Cardiovascular: Positive for chest pain, leg swelling and near-syncope.  Gastrointestinal: Negative for nausea, vomiting, abdominal pain and diarrhea.  Genitourinary: Negative for dysuria and hematuria.  Musculoskeletal: Negative for back pain and neck pain.  Skin: Negative for rash.  Neurological: Positive for  dizziness, numbness and headaches. Negative for syncope and weakness.  Hematological: Does not bruise/bleed easily.  Psychiatric/Behavioral: Negative for confusion.      Allergies  Codeine and Sulfonamide derivatives  Home Medications   Prior to Admission medications   Medication Sig Start Date End Date Taking? Authorizing Provider  ALPRAZolam Duanne Moron) 0.25 MG tablet Take 0.25 mg by mouth at bedtime as needed for anxiety or sleep.   Yes Historical Provider, MD  aspirin (ASPIRIN LOW DOSE) 81 MG EC tablet Take 81 mg by mouth daily. Take one tablet by mouth daily    Yes Historical Provider, MD  esomeprazole (NEXIUM) 40 MG capsule Take 40 mg by mouth 3 (three) times a week.   Yes Historical Provider, MD  loratadine (CLARITIN) 10 MG tablet Take 10 mg by mouth daily.   Yes Historical Provider, MD  metoprolol succinate (TOPROL-XL) 50 MG 24 hr tablet Take 50 mg by mouth daily. Take with or immediately following a meal.   Yes Historical Provider, MD  metoprolol succinate (TOPROL-XL) 50 MG 24 hr tablet Take 50 mg by mouth daily. Take with or immediately following a meal.   Yes Historical Provider, MD  venlafaxine XR (EFFEXOR-XR) 75 MG 24 hr capsule Take 75 mg by mouth daily with breakfast.   Yes Historical Provider, MD  Clobetasol Propionate 0.05 % shampoo Use once weekly to  shampoo affected areas of scalp for 4 weeks, then as needed 06/13/14   [name], MD  clotrimazole-betamethasone (LOTRISONE) cream Apply twice daily to affected area (s) of scalp with rash for 10 days, then  as needed 06/13/14   [name], MD  ibuprofen (ADVIL) 200 MG tablet Take 200 mg by mouth every 6 (six) hours as needed (pain).     Historical Provider, MD   BP 111/62  Pulse 64  Temp(Src) 98.1 F (36.7 C) (Oral)  Resp 16  Ht 5' 2"  (1.575 m)  Wt 310 lb (140.615 kg)  BMI 56.69 kg/m2  SpO2 100% Physical Exam  Nursing note and vitals reviewed. Constitutional: She is oriented to person, place, and time.  She appears well-developed and well-nourished. No distress.  HENT:  Head: Normocephalic and atraumatic.  Mouth/Throat: Oropharynx is clear and moist. No oropharyngeal exudate.  Eyes: Pupils are equal, round, and reactive to light.  Neck: Neck supple.  Cardiovascular: Normal rate, regular rhythm and normal heart sounds.   No murmur heard. Pulmonary/Chest: Effort normal and breath sounds normal. No respiratory distress. She has no wheezes. She has no rales.  Abdominal: Soft. Bowel sounds are normal. She exhibits no distension. There is no tenderness.  Musculoskeletal: She exhibits edema (swelling left leg).  Neurological: She  is alert and oriented to person, place, and time. No cranial nerve deficit. She exhibits normal muscle tone. Coordination normal.  Skin: Skin is warm and dry. No rash noted.  Psychiatric: She has a normal mood and affect. Her behavior is normal.    ED Course  Procedures  DIAGNOSTIC STUDIES: Oxygen Saturation is 100% on RA, nml by my interpretation.    COORDINATION OF CARE: 1:32 PM-Discussed treatment plan which includes CT head, CXR and labs with pt at bedside and pt agreed to plan.   Labs Review Labs Reviewed  CBC WITH DIFFERENTIAL - Abnormal; Notable for the following:    RBC 5.90 (*)    MCV 72.5 (*)    MCH 24.1 (*)    Neutrophils Relative % 21 (*)    Neutro Abs 1.3 (*)    Lymphocytes Relative 68 (*)    Lymphs Abs 4.1 (*)    All other components within normal limits  COMPREHENSIVE METABOLIC PANEL - Abnormal; Notable for the following:    Total Protein 9.2 (*)    Albumin 3.0 (*)    AST 159 (*)    ALT 67 (*)    Alkaline Phosphatase 166 (*)    All other components within normal limits  TROPONIN I   Results for orders placed during the hospital encounter of 07/04/14  CBC WITH DIFFERENTIAL      Result Value Ref Range   WBC 6.0  4.0 - 10.5 K/uL   RBC 5.90 (*) 3.87 - 5.11 MIL/uL   Hemoglobin 14.2  12.0 - 15.0 g/dL   HCT 42.8  36.0 - 46.0 %   MCV 72.5  (*) 78.0 - 100.0 fL   MCH 24.1 (*) 26.0 - 34.0 pg   MCHC 33.2  30.0 - 36.0 g/dL   RDW 15.1  11.5 - 15.5 %   Platelets 162  150 - 400 K/uL   Neutrophils Relative % 21 (*) 43 - 77 %   Neutro Abs 1.3 (*) 1.7 - 7.7 K/uL   Lymphocytes Relative 68 (*) 12 - 46 %   Lymphs Abs 4.1 (*) 0.7 - 4.0 K/uL   Monocytes Relative 9  3 - 12 %   Monocytes Absolute 0.5  0.1 - 1.0 K/uL   Eosinophils Relative 2  0 - 5 %   Eosinophils Absolute 0.1  0.0 - 0.7 K/uL   Basophils Relative 1  0 - 1 %   Basophils Absolute 0.0  0.0 - 0.1 K/uL  COMPREHENSIVE METABOLIC PANEL      Result Value Ref Range   Sodium 138  137 - 147 mEq/L   Potassium 3.7  3.7 - 5.3 mEq/L   Chloride 103  96 - 112 mEq/L   CO2 26  19 - 32 mEq/L   Glucose, Bld 85  70 - 99 mg/dL   BUN 6  6 - 23 mg/dL   Creatinine, Ser 0.69  0.50 - 1.10 mg/dL   Calcium 8.7  8.4 - 10.5 mg/dL   Total Protein 9.2 (*) 6.0 - 8.3 g/dL   Albumin 3.0 (*) 3.5 - 5.2 g/dL   AST 159 (*) 0 - 37 U/L   ALT 67 (*) 0 - 35 U/L   Alkaline Phosphatase 166 (*) 39 - 117 U/L   Total Bilirubin 0.9  0.3 - 1.2 mg/dL   GFR calc non Af Amer >90  >90 mL/min   GFR calc Af Amer >90  >90 mL/min   Anion gap 9  5 - 15  TROPONIN I  Result Value Ref Range   Troponin I <0.30  <0.30 ng/mL     Imaging Review Ct Head Wo Contrast  07/04/2014   CLINICAL DATA:  Sudden onset of headache and dizziness ; several hours of numbness of the right tongue and lip  EXAM: CT HEAD WITHOUT CONTRAST  TECHNIQUE: Contiguous axial images were obtained from the base of the skull through the vertex without intravenous contrast.  COMPARISON:  None.  FINDINGS: The ventricles are normal in size and configuration. There is mild invagination of CSF into the sella. There is no mass, hemorrhage, extra-axial fluid collection, or midline shift. Gray-white compartments are normal. No demonstrable acute infarct. Bony calvarium appears intact. The mastoid air cells are clear.  IMPRESSION: Mild empty sella. No intracranial  mass, hemorrhage, or acute appearing infarct. No gray-white compartment lesions are appreciable.   Electronically Signed   By: Lowella Grip M.D.   On: 07/04/2014 14:28   Mr Brain Wo Contrast  07/04/2014   CLINICAL DATA:  48 year old female with acute onset headache, numbness and tingling in the lips and tongue, dizziness, near syncope. Initial encounter.  EXAM: MRI HEAD WITHOUT CONTRAST  TECHNIQUE: Multiplanar, multiecho pulse sequences of the brain and surrounding structures were obtained without intravenous contrast.  COMPARISON:  Head CT without contrast 1424 hr the same day.  FINDINGS: Cerebral volume is normal. No restricted diffusion to suggest acute infarction. No midline shift, mass effect, evidence of mass lesion, ventriculomegaly, extra-axial collection or acute intracranial hemorrhage. Cervicomedullary junction and pituitary are within normal limits. Negative visualized cervical spine. Major intracranial vascular flow voids are preserved, it distal left vertebral artery appears dominant. Pearline Cables and white matter signal is within normal limits throughout the brain.  Grossly negative visible internal auditory structures. Visualized paranasal sinuses and mastoids are clear. Visualized orbit soft tissues are within normal limits. Visualized scalp soft tissues are within normal limits. Large body habitus. Normal bone marrow signal.  IMPRESSION: Normal non contrast MRI appearance of the brain.   Electronically Signed   By: Lars Pinks M.D.   On: 07/04/2014 16:37   Dg Chest Portable 1 View  07/04/2014   CLINICAL DATA:  Seven onset of dizziness and sweating. Initial evaluation.  EXAM: PORTABLE CHEST - 1 VIEW  COMPARISON:  None.  FINDINGS: The heart size and mediastinal contours are within normal limits. Both lungs are clear. The visualized skeletal structures are unremarkable.  IMPRESSION: No active disease.   Electronically Signed   By: Marcello Moores  Register   On: 07/04/2014 13:29     EKG  Interpretation   Date/Time:  Thursday July 04 2014 12:55:54 EDT Ventricular Rate:  68 PR Interval:  153 QRS Duration: 97 QT Interval:  420 QTC Calculation: 447 R Axis:   53 Text Interpretation:  Sinus rhythm Inferior infarct, old No significant  change since last tracing Confirmed by Theodore Virgin  MD, [place] [phone]) on  07/04/2014 1:56:28 PM      MDM   Final diagnoses:  Near syncope  Numbness and tingling of right face    Workup for the near syncopal episode and for the right-sided tongue and face lip numbness and tingling negative including MRI of the brain. Patient's blood pressure was initially high at school but here has been under 315 systolic. Patient feeling better other than a mild headache. Will discharge home followup with her regular Dr. No evidence of stroke today. The tingling numbness has resolved except for the right lip area.      I personally performed the  services described in this documentation, which was scribed in my presence. The recorded information has been reviewed and is accurate.     Fredia Sorrow, MD 07/04/14 [phone]

## 2014-07-04 NOTE — ED Notes (Signed)
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  Bp was 178/80 and cbg was 80  Alert, MAE

## 2014-07-04 NOTE — Discharge Instructions (Signed)
Workup including MRI of the brain without any significant findings. Make an appointment to followup with your regular Dr. Return for any new or worse symptoms. Work note provided for tomorrow.

## 2014-07-04 NOTE — ED Notes (Signed)
MD at bedside. 

## 2014-07-20 NOTE — Progress Notes (Signed)
   Subjective:    Patient ID: Holly Alvarado, female    DOB: [birth year], 48 y.o.   MRN: [id number]  HPI The PT is here for follow up and re-evaluation of chronic medical conditions, medication management and review of any available recent lab and radiology data.  Preventive health is updated, specifically  Cancer screening and Immunization.   Has decided on bariatric surgery this year and is certainly in need of this for her physical wellbeing.  The PT denies any adverse reactions to current medications since the last visit.        Review of Systems See HPI Denies recent fever or chills. Denies sinus pressure, nasal congestion, ear pain or sore throat. Denies chest congestion, productive cough or wheezing. Denies chest pains, palpitations and leg swelling Denies abdominal pain, nausea, vomiting,diarrhea or constipation.   Denies dysuria, frequency, hesitancy or incontinence. Denies joint pain, swelling does have limitation in mobility. Denies headaches, seizures, numbness, or tingling. Denies uncontrolled depression, anxiety or insomnia. C/o pruritic rash on posterior right ear and hair loss and flaking of the scalp     Objective:   Physical Exam BP 134/76  Pulse 100  Resp 18  Ht 5' 2"  (1.575 m)  Wt 312 lb (141.522 kg)  BMI 57.05 kg/m2  SpO2 98% Patient alert and oriented and in no cardiopulmonary distress.  HEENT: No facial asymmetry, EOMI,   oropharynx pink and moist.  Neck supple no JVD, no mass.  Chest: Clear to auscultation bilaterally.  CVS: S1, S2 no murmurs, no S3.Regular rate.  ABD: Soft non tender.   Ext: No edema  MS: Adequate ROM spine, shoulders, hips and knees.  Skin: fungal rash and [place]heah affecting the scalp Psych: Good eye contact, normal affect. Memory intact not anxious or depressed appearing.  CNS: CN 2-12 intact, power,  normal throughout.no focal deficits noted.        Assessment & Plan:  Tinea capitis Rash in posterior right  scalp diameter approx 3.5 cm Use topical prep for 10 days then as needed  Essential hypertension Controlled, no change in medication DASH diet and commitment to daily physical activity for a minimum of 30 minutes discussed and encouraged, as a part of hypertension management. The importance of attaining a healthy weight is also discussed.   ALLERGIC RHINITIS, SEASONAL Controlled, no change in medication   Prediabetes Patient educated about the importance of limiting  Carbohydrate intake , the need to commit to daily physical activity for a minimum of 30 minutes , and to commit weight loss. The fact that changes in all these areas will reduce or eliminate all together the development of diabetes is stressed.   Updated labs will be obtained by bariatric center  Seborrheic dermatitis of scalp Steroid and antifungal shampoo prescribed  Metabolic syndrome X The increased risk of cardiovascular disease associated with this diagnosis, and the need to consistently work on lifestyle to change this is discussed. Following  a  heart healthy diet ,commitment to 30 minutes of exercise at least 5 days per week, as well as control of blood sugar and cholesterol , and achieving a healthy weight are all the areas to be addressed .   GENERALIZED ANXIETY DISORDER Controlled, no change in medication   Morbid obesity with BMI of 50.0-59.9, adult Unchanged Patient plans to have bariaitric surgery this Winter/ late Fall which she desperately needs

## 2014-07-20 NOTE — Assessment & Plan Note (Signed)
Steroid and antifungal shampoo prescribed

## 2014-07-20 NOTE — Assessment & Plan Note (Signed)
Patient educated about the importance of limiting  Carbohydrate intake , the need to commit to daily physical activity for a minimum of 30 minutes , and to commit weight loss. The fact that changes in all these areas will reduce or eliminate all together the development of diabetes is stressed.   Updated labs will be obtained by bariatric center

## 2014-07-20 NOTE — Assessment & Plan Note (Signed)
The increased risk of cardiovascular disease associated with this diagnosis, and the need to consistently work on lifestyle to change this is discussed. Following  a  heart healthy diet ,commitment to 30 minutes of exercise at least 5 days per week, as well as control of blood sugar and cholesterol , and achieving a healthy weight are all the areas to be addressed .  

## 2014-07-20 NOTE — Assessment & Plan Note (Signed)
Controlled, no change in medication  

## 2014-07-20 NOTE — Assessment & Plan Note (Signed)
Controlled, no change in medication DASH diet and commitment to daily physical activity for a minimum of 30 minutes discussed and encouraged, as a part of hypertension management. The importance of attaining a healthy weight is also discussed.  

## 2014-07-20 NOTE — Assessment & Plan Note (Signed)
Unchanged Patient plans to have bariaitric surgery this Winter/ late Fall which she desperately needs

## 2014-08-12 ENCOUNTER — Encounter: Payer: BC Managed Care – PPO | Attending: Surgery

## 2014-08-12 ENCOUNTER — Other Ambulatory Visit (INDEPENDENT_AMBULATORY_CARE_PROVIDER_SITE_OTHER): Payer: Self-pay

## 2014-08-12 VITALS — Ht 62.0 in | Wt 315.5 lb

## 2014-08-12 DIAGNOSIS — Z713 Dietary counseling and surveillance: Secondary | ICD-10-CM | POA: Diagnosis not present

## 2014-08-12 DIAGNOSIS — Z6841 Body Mass Index (BMI) 40.0 and over, adult: Secondary | ICD-10-CM | POA: Insufficient documentation

## 2014-08-12 DIAGNOSIS — A048 Other specified bacterial intestinal infections: Secondary | ICD-10-CM

## 2014-08-12 MED ORDER — AMOXICILL-CLARITHRO-LANSOPRAZ PO MISC: Freq: Two times a day (BID) | ORAL | Status: DC

## 2014-08-12 NOTE — Progress Notes (Signed)
  Pre-Operative Nutrition Class:  Appt start time: 830   End time:  930.  Patient was seen on 08/12/14 for Pre-Operative Bariatric Surgery Education at [facility].   Surgery date: 09/02/2014 Surgery type: RYGB Start weight at [facility]: 313 lbs on 05/30/14 Weight today: 315.5 lbs  TANITA  BODY COMP RESULTS  08/12/14   BMI (kg/m^2) 57.7   Fat Mass (lbs) 181   Fat Free Mass (lbs) 134.5   Total Body Water (lbs) 98.5   Samples given per MNT protocol. Patient educated on appropriate usage: Unjury protein powder (unflavored - qty 1) Lot #: [id number] Exp: 07/2015  PB2 (qty 1) Lot #: [id number] Exp: 04/2015  Celebrate Vitamins Multivitamin (pineapple strawberry - qty 1) Lot #: [id number] Exp: 10/2014  Premier protein shake (chocolate - qty 1) Lot #: [id number] Exp: 05/2015   The following the learning objectives were met by the patient during this course:  Identify Pre-Op Dietary Goals and will begin 2 weeks pre-operatively  Identify appropriate sources of fluids and proteins   State protein recommendations and appropriate sources pre and post-operatively  Identify Post-Operative Dietary Goals and will follow for 2 weeks post-operatively  Identify appropriate multivitamin and calcium sources  Describe the need for physical activity post-operatively and will follow MD recommendations  State when to call healthcare provider regarding medication questions or post-operative complications  Handouts given during class include:  Pre-Op Bariatric Surgery Diet Handout  Protein Shake Handout  Post-Op Bariatric Surgery Nutrition Handout  BELT Program Information Flyer  Support Group Information Flyer  [facility] Bariatric Supplements Price List  Follow-Up Plan: Patient will follow-up at [facility] 2 weeks post operatively for diet advancement per MD.

## 2014-08-15 NOTE — Progress Notes (Signed)
Please put orders in Epic surgery 09-02-14 pre op 08-21-14 Thanks

## 2014-08-20 ENCOUNTER — Other Ambulatory Visit: Payer: Self-pay | Admitting: Family Medicine

## 2014-08-20 ENCOUNTER — Other Ambulatory Visit: Payer: Self-pay

## 2014-08-20 MED ORDER — METOPROLOL SUCCINATE ER 50 MG PO TB24: 50.0000 mg | ORAL_TABLET | Freq: Every day | ORAL | Status: DC

## 2014-08-20 MED ORDER — VENLAFAXINE HCL ER 75 MG PO CP24: 75.0000 mg | ORAL_CAPSULE | Freq: Every day | ORAL | Status: DC

## 2014-08-21 ENCOUNTER — Inpatient Hospital Stay (HOSPITAL_COMMUNITY): Admission: RE | Admit: 2014-08-21 | Payer: BC Managed Care – PPO | Source: Ambulatory Visit

## 2014-08-27 NOTE — Progress Notes (Signed)
Preop on 08/28/14 at 0900am,.  Surgery on 09/02/14.  Need orders in EPIC.  Thank You.

## 2014-08-27 NOTE — Patient Instructions (Addendum)
Holly Alvarado  08/27/2014   Your procedure is scheduled on:  09/02/2014    Come thru the [facility] Entrance.    Follow the Signs to [place] at  0920      am  Call this number if you have problems the morning of surgery: [phone]   Remember:   Do not eat food or drink liquids after midnight.   Take these medicines the morning of surgery with A SIP OF WATER: Metoprolol ( Lopressor ) , Effexor    Do not wear jewelry, make-up or nail polish.  Do not wear lotions, powders, or perfumes.  deodorant.  Do not shave 48 hours prior to surgery. .  Do not bring valuables to the hospital.  Contacts, dentures or bridgework may not be worn into surgery.  Leave suitcase in the car. After surgery it may be brought to your room.  For patients admitted to the hospital, checkout time is 11:00 AM the day of  discharge.         Please read over the following fact sheets that you were given:  coughing and deep breathing exercises, leg exercises            [place] - Preparing for Surgery Before surgery, you can play an important role.  Because skin is not sterile, your skin needs to be as free of germs as possible.  You can reduce the number of germs on your skin by washing with CHG (chlorahexidine gluconate) soap before surgery.  CHG is an antiseptic cleaner which kills germs and bonds with the skin to continue killing germs even after washing. Please DO NOT use if you have an allergy to CHG or antibacterial soaps.  If your skin becomes reddened/irritated stop using the CHG and inform your nurse when you arrive at Short Stay. Do not shave (including legs and underarms) for at least 48 hours prior to the first CHG shower.  You may shave your face/neck. Please follow these instructions carefully:  1.  Shower with CHG Soap the night before surgery and the  morning of Surgery.  2.  If you choose to wash your hair, wash your hair first as usual with your  normal  shampoo.  3.  After you  shampoo, rinse your hair and body thoroughly to remove the  shampoo.                           4.  Use CHG as you would any other liquid soap.  You can apply chg directly  to the skin and wash                       Gently with a scrungie or clean washcloth.  5.  Apply the CHG Soap to your body ONLY FROM THE NECK DOWN.   Do not use on face/ open                           Wound or open sores. Avoid contact with eyes, ears mouth and genitals (private parts).                       Wash face,  Genitals (private parts) with your normal soap.             6.  Wash thoroughly, paying special attention to the area where your surgery  will be performed.  7.  Thoroughly rinse your body with warm water from the neck down.  8.  DO NOT shower/wash with your normal soap after using and rinsing off  the CHG Soap.                9.  Pat yourself dry with a clean towel.            10.  Wear clean pajamas.            11.  Place clean sheets on your bed the night of your first shower and do not  sleep with pets. Day of Surgery : Do not apply any lotions/deodorants the morning of surgery.  Please wear clean clothes to the hospital/surgery center.  FAILURE TO FOLLOW THESE INSTRUCTIONS MAY RESULT IN THE CANCELLATION OF YOUR SURGERY PATIENT SIGNATURE_________________________________  NURSE SIGNATURE__________________________________  ________________________________________________________________________

## 2014-08-28 ENCOUNTER — Encounter (HOSPITAL_COMMUNITY): Payer: Self-pay

## 2014-08-28 ENCOUNTER — Encounter (HOSPITAL_COMMUNITY)
Admission: RE | Admit: 2014-08-28 | Discharge: 2014-08-28 | Disposition: A | Discharge status: Home / Self Care | Payer: BC Managed Care – PPO | Source: Ambulatory Visit | Attending: Surgery | Admitting: Surgery

## 2014-08-28 ENCOUNTER — Ambulatory Visit (INDEPENDENT_AMBULATORY_CARE_PROVIDER_SITE_OTHER): Payer: Self-pay | Admitting: Surgery

## 2014-08-28 DIAGNOSIS — Z01812 Encounter for preprocedural laboratory examination: Secondary | ICD-10-CM | POA: Insufficient documentation

## 2014-08-28 HISTORY — DX: Anemia, unspecified: D64.9

## 2014-08-28 HISTORY — DX: Anxiety disorder, unspecified: F41.9

## 2014-08-28 HISTORY — DX: Other complications of anesthesia, initial encounter: T88.59XA

## 2014-08-28 HISTORY — DX: Adverse effect of unspecified anesthetic, initial encounter: T41.45XA

## 2014-08-28 LAB — BASIC METABOLIC PANEL
Anion gap: 10 (ref 5–15)
BUN: 9 mg/dL (ref 6–23)
CALCIUM: 9.3 mg/dL (ref 8.4–10.5)
CO2: 24 mEq/L (ref 19–32)
CREATININE: 0.62 mg/dL (ref 0.50–1.10)
Chloride: 104 mEq/L (ref 96–112)
GFR calc Af Amer: >90 mL/min (ref >90)
GFR calc non Af Amer: >90 mL/min (ref >90)
GLUCOSE: 124 mg/dL — AB (ref 70–99)
Potassium: 4.3 mEq/L (ref 3.7–5.3)
SODIUM: 138 meq/L (ref 137–147)

## 2014-08-28 LAB — CBC
HCT: 42.2 % (ref 36.0–46.0)
HEMOGLOBIN: 13.6 g/dL (ref 12.0–15.0)
MCH: 23.9 pg — AB (ref 26.0–34.0)
MCHC: 32.2 g/dL (ref 30.0–36.0)
MCV: 74.2 fL — ABNORMAL LOW (ref 78.0–100.0)
Platelets: 141 10*3/uL — ABNORMAL LOW (ref 150–400)
RBC: 5.69 MIL/uL — ABNORMAL HIGH (ref 3.87–5.11)
RDW: 15.5 % (ref 11.5–15.5)
WBC: 5.9 10*3/uL (ref 4.0–10.5)

## 2014-08-28 NOTE — Progress Notes (Signed)
EKG- 07/08/14 EPIC  CXR- 03/01/14 EPIC , !v- 07/04/14 EPIC

## 2014-08-28 NOTE — H&P (Signed)
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 female is seen for a preop visit.  Her orders are in EPIC and all of her questions again answered regarding the surgery.  Past Medical History  Diagnosis Date  . Allergic rhinitis, seasonal   . Anxiety disorder   . GERD (gastroesophageal reflux disease)   . Obesity   . Hypertension   . Chronic constipation   . Hemorrhoids   . Esophageal polyp   . Fatty liver   . Complication of anesthesia     woke up during surgery of tubal ligation   . Diabetes mellitus without complication     prediabetic not on meds   . Anxiety     hx of panic attacks and panic attacks with surgery   . Anemia     hx of     Past Surgical History  Procedure Laterality Date  . Ectopic pregnancy surgery  1994, 1995  . Blt  1987  . Cholecystectomy  1997  . Total abdominal hysterectomy  03/11/08    for fibroids   . Mouth surgery      wisdom tooth extraction  . Partial hysterectomy    . Colonoscopy  12/30/2010  . Upper gastrointestinal endoscopy  12/30/2010  . Upper gastrointestinal endoscopy  05/11/04  . Upper gastrointestinal endoscopy  04/30/97    ANWAR  . Breast biopsy Right 2010    benign  . Breath tek h pylori N/A 02/25/2014    Procedure: BREATH TEK H PYLORI;  Surgeon: Pedro Earls, MD;  Location: [facility];  Service: General;  Laterality: N/A;  . Tubal ligation      Current Outpatient Prescriptions  Medication Sig Dispense Refill  . ALPRAZolam (XANAX) 0.25 MG tablet Take 0.25 mg by mouth at bedtime as needed for anxiety or sleep.    Marland Kitchen amoxicillin-clarithromycin-lansoprazole (PREVPAC) combo pack Take by mouth 2 (two) times daily. Follow package directions. 1 kit 0  . aspirin (ASPIRIN LOW DOSE) 81 MG EC tablet Take 81 mg by mouth every morning.     . Calcium Carbonate Antacid (TUMS E-X PO) Take 2 tablets by mouth daily as needed (heart burn).    . Clobetasol Propionate 0.05 %  shampoo Use once weekly to  shampoo affected areas of scalp for 4 weeks, then as needed 118 mL 1  . clotrimazole-betamethasone (LOTRISONE) cream Apply twice daily to affected area (s) of scalp with rash for 10 days, then  as needed (Patient taking differently: Apply 1 application topically 2 (two) times daily as needed (rash on scalp.). ) 45 g 1  . diphenhydrAMINE (BENADRYL) 25 MG tablet Take 25-50 mg by mouth at bedtime as needed for allergies.    Marland Kitchen ergocalciferol (VITAMIN D2) 50000 UNITS capsule Take 50,000 Units by mouth once a week. Tuesday.    . esomeprazole (NEXIUM) 40 MG capsule Take 40 mg by mouth 3 (three) times a week. At night.    . fluticasone (FLONASE) 50 MCG/ACT nasal spray Place 2 sprays into both nostrils daily as needed for allergies or rhinitis.    Marland Kitchen ibuprofen (ADVIL) 200 MG tablet Take 200-400 mg by mouth every 6 (six) hours as needed (pain).     Marland Kitchen loratadine (CLARITIN) 10 MG tablet Take 10 mg by mouth daily as needed for allergies.     . metoprolol succinate (TOPROL-XL) 50 MG 24 hr tablet Take 1 tablet (50 mg total) by mouth daily.  Take with or immediately following a meal. (Patient not taking: Reported on 08/28/2014) 30 tablet 5  . metoprolol succinate (TOPROL-XL) 50 MG 24 hr tablet TAKE 1 TABLET BY MOUTH TWICE A DAY WITH OR IMMEDIATELY FOLLOWING A MEAL 60 tablet 4  . venlafaxine XR (EFFEXOR-XR) 75 MG 24 hr capsule TAKE 1 CAPSULE BY MOUTH EVERY DAY (Patient not taking: Reported on 08/28/2014) 30 capsule 4  . venlafaxine XR (EFFEXOR-XR) 75 MG 24 hr capsule Take 75 mg by mouth every morning.     No current facility-administered medications for this visit.   Codeine and Sulfonamide derivatives Family History  Problem Relation Age of Onset  . Hypertension Mother   . COPD Mother   . Hyperlipidemia Father   . Hypertension Father   . Hyperlipidemia Sister   . Hypertension Sister   . Hyperlipidemia Sister   . Hypertension Sister   . Diabetes      family history   . Asthma       family history   . Arthritis      family history   . Stroke Other   . Cancer Other   . Heart disease Other    Social History:   reports that she has never smoked. She has never used smokeless tobacco. She reports that she does not drink alcohol or use illicit drugs.   REVIEW OF SYSTEMS : Negative except for C problem list  Physical Exam:   There were no vitals taken for this visit. There is no weight on file to calculate BMI.  Gen:  WDWN African-American female NAD  Neurological: Alert and oriented to person, place, and time. Motor and sensory function is grossly intact  Head: Normocephalic and atraumatic.  Eyes: Conjunctivae are normal. Pupils are equal, round, and reactive to light. No scleral icterus.  Neck: Normal range of motion. Neck supple. No tracheal deviation or thyromegaly present.  Cardiovascular:  SR without murmurs or gallops.  No carotid bruits Breast:  Not examined Respiratory: Effort normal.  No respiratory distress. No chest wall tenderness. Breath sounds normal.  No wheezes, rales or rhonchi.  Abdomen:  Obese with prior laparoscopic cholecystectomy incisions GU:  Not examined Musculoskeletal: Normal range of motion. Extremities are nontender. No cyanosis, edema or clubbing noted Lymphadenopathy: No cervical, preauricular, postauricular or axillary adenopathy is present Skin: Skin is warm and dry. No rash noted. No diaphoresis. No erythema. No pallor. Pscyh: Normal mood and affect. Behavior is normal. Judgment and thought content normal.   LABORATORY RESULTS: Results for orders placed or performed during the hospital encounter of 08/28/14 (from the past 48 hour(s))  CBC     Status: Abnormal   Collection Time: 08/28/14  9:25 AM  Result Value Ref Range   WBC 5.9 4.0 - 10.5 K/uL   RBC 5.69 (H) 3.87 - 5.11 MIL/uL   Hemoglobin 13.6 12.0 - 15.0 g/dL   HCT 42.2 36.0 - 46.0 %   MCV 74.2 (L) 78.0 - 100.0 fL   MCH 23.9 (L) 26.0 - 34.0 pg   MCHC 32.2 30.0 - 36.0 g/dL    RDW 15.5 11.5 - 15.5 %   Platelets 141 (L) 150 - 400 K/uL  Basic metabolic panel     Status: Abnormal   Collection Time: 08/28/14  9:25 AM  Result Value Ref Range   Sodium 138 137 - 147 mEq/L   Potassium 4.3 3.7 - 5.3 mEq/L   Chloride 104 96 - 112 mEq/L   CO2 24 19 - 32 mEq/L  Glucose, Bld 124 (H) 70 - 99 mg/dL   BUN 9 6 - 23 mg/dL   Creatinine, Ser 0.62 0.50 - 1.10 mg/dL   Calcium 9.3 8.4 - 10.5 mg/dL   GFR calc non Af Amer >90 >90 mL/min   GFR calc Af Amer >90 >90 mL/min    Comment: (NOTE) The eGFR has been calculated using the CKD EPI equation. This calculation has not been validated in all clinical situations. eGFR's persistently <90 mL/min signify possible Chronic Kidney Disease.    Anion gap 10 5 - 15     RADIOLOGY RESULTS: No results found.  Problem List: Patient Active Problem List   Diagnosis Date Noted  . Seborrheic dermatitis of scalp 06/13/2014  . Tinea capitis 06/13/2014  . Lymphocytosis 09/10/2013  . Colon polyps,hyperplastic, sigmoid 09/10/2013  . Steatosis of liver 09/10/2013  . 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 Positive serology for Helicobacter pylori 06/38/6854  . Abdominal pain, epigastric 08/13/2013  . Metabolic syndrome X 88/30/1415  . Pain of right great toe 05/17/2013  . Elevated LFTs 05/15/2013  . Unspecified vitamin D deficiency 05/11/2013  . Prediabetes 05/10/2013  . Depression 08/07/2012  . Joint pain 07/25/2012  . Carotid bruit 10/07/2011  . Left ankle pain 06/22/2009  . Essential hypertension 06/18/2008  . Morbid obesity with BMI of 50.0-59.9, adult 02/20/2008  . GENERALIZED ANXIETY DISORDER 02/20/2008  . ALLERGIC RHINITIS, SEASONAL 02/20/2008  . GERD 02/20/2008    Assessment & Plan: Morbid obesity for Roux-en-Y gastric bypass on December 7.  Bowel prep and postoperative pain meds given to her.    Matt B. Hassell Done, MD, [facility] [phone] beeper [phone]  08/28/2014 5:27  PM

## 2014-08-29 LAB — DIFFERENTIAL
Basophils Absolute: 0.1 10*3/uL (ref 0.0–0.1)
Basophils Relative: 1 % (ref 0–1)
Eosinophils Absolute: 0.1 10*3/uL (ref 0.0–0.7)
Eosinophils Relative: 2 % (ref 0–5)
Lymphocytes Relative: 77 % — ABNORMAL HIGH (ref 12–46)
Lymphs Abs: 4.5 10*3/uL — ABNORMAL HIGH (ref 0.7–4.0)
Monocytes Absolute: 0.4 10*3/uL (ref 0.1–1.0)
Monocytes Relative: 7 % (ref 3–12)
Neutro Abs: 0.8 10*3/uL — ABNORMAL LOW (ref 1.7–7.7)
Neutrophils Relative %: 13 % — ABNORMAL LOW (ref 43–77)

## 2014-08-29 LAB — HEPATIC FUNCTION PANEL
ALT: 142 U/L — AB (ref 0–35)
AST: 265 U/L — AB (ref 0–37)
Albumin: 3.2 g/dL — ABNORMAL LOW (ref 3.5–5.2)
Alkaline Phosphatase: 127 U/L — ABNORMAL HIGH (ref 39–117)
BILIRUBIN INDIRECT: 0.8 mg/dL (ref 0.3–0.9)
Bilirubin, Direct: 0.3 mg/dL (ref 0.0–0.3)
TOTAL PROTEIN: 9.2 g/dL — AB (ref 6.0–8.3)
Total Bilirubin: 1.1 mg/dL (ref 0.3–1.2)

## 2014-08-29 NOTE — Progress Notes (Addendum)
08-29-14 1030 AM note labs viewable in Epic. CBC, BMP done and ran 08-28-14, additional to this after orders received- CMP run and results entered, differential to be ran additional today-results pending. 08-29-14 1145 Differential now resulting and CBC amended by lab.

## 2014-09-02 ENCOUNTER — Inpatient Hospital Stay (HOSPITAL_COMMUNITY)
Admission: RE | Admit: 2014-09-02 | Discharge: 2014-09-06 | DRG: 621 | Disposition: A | Discharge status: Home / Self Care | Payer: BC Managed Care – PPO | Source: Ambulatory Visit | Attending: Surgery | Admitting: Surgery

## 2014-09-02 ENCOUNTER — Encounter (HOSPITAL_COMMUNITY)
Admission: RE | Disposition: A | Discharge status: Home / Self Care | Payer: Self-pay | Source: Ambulatory Visit | Attending: Surgery

## 2014-09-02 ENCOUNTER — Inpatient Hospital Stay (HOSPITAL_COMMUNITY): Payer: BC Managed Care – PPO | Admitting: Anesthesiology

## 2014-09-02 ENCOUNTER — Encounter (HOSPITAL_COMMUNITY): Payer: Self-pay | Admitting: *Deleted

## 2014-09-02 DIAGNOSIS — E119 Type 2 diabetes mellitus without complications: Secondary | ICD-10-CM | POA: Diagnosis present

## 2014-09-02 DIAGNOSIS — Z6841 Body Mass Index (BMI) 40.0 and over, adult: Secondary | ICD-10-CM

## 2014-09-02 DIAGNOSIS — F411 Generalized anxiety disorder: Secondary | ICD-10-CM | POA: Diagnosis present

## 2014-09-02 DIAGNOSIS — K760 Fatty (change of) liver, not elsewhere classified: Secondary | ICD-10-CM | POA: Diagnosis present

## 2014-09-02 DIAGNOSIS — F329 Major depressive disorder, single episode, unspecified: Secondary | ICD-10-CM | POA: Diagnosis present

## 2014-09-02 DIAGNOSIS — Z7982 Long term (current) use of aspirin: Secondary | ICD-10-CM

## 2014-09-02 DIAGNOSIS — I10 Essential (primary) hypertension: Secondary | ICD-10-CM | POA: Diagnosis present

## 2014-09-02 DIAGNOSIS — K219 Gastro-esophageal reflux disease without esophagitis: Secondary | ICD-10-CM | POA: Diagnosis present

## 2014-09-02 DIAGNOSIS — Z79899 Other long term (current) drug therapy: Secondary | ICD-10-CM

## 2014-09-02 DIAGNOSIS — Z9884 Bariatric surgery status: Secondary | ICD-10-CM

## 2014-09-02 HISTORY — PX: GASTRIC ROUX-EN-Y: SHX5262

## 2014-09-02 LAB — CBC
HCT: 39.4 % (ref 36.0–46.0)
Hemoglobin: 12.8 g/dL (ref 12.0–15.0)
MCH: 24 pg — AB (ref 26.0–34.0)
MCHC: 32.5 g/dL (ref 30.0–36.0)
MCV: 73.8 fL — ABNORMAL LOW (ref 78.0–100.0)
PLATELETS: 141 10*3/uL — AB (ref 150–400)
RBC: 5.34 MIL/uL — AB (ref 3.87–5.11)
RDW: 15.6 % — ABNORMAL HIGH (ref 11.5–15.5)
WBC: 10.1 10*3/uL (ref 4.0–10.5)

## 2014-09-02 LAB — GLUCOSE, CAPILLARY
GLUCOSE-CAPILLARY: 88 mg/dL (ref 70–99)
Glucose-Capillary: 135 mg/dL — ABNORMAL HIGH (ref 70–99)

## 2014-09-02 LAB — HEMOGLOBIN AND HEMATOCRIT, BLOOD
HCT: 38.5 % (ref 36.0–46.0)
Hemoglobin: 12.1 g/dL (ref 12.0–15.0)

## 2014-09-02 LAB — CREATININE, SERUM
Creatinine, Ser: 0.7 mg/dL (ref 0.50–1.10)
GFR calc Af Amer: >90 mL/min (ref >90)

## 2014-09-02 SURGERY — LAPAROSCOPIC ROUX-EN-Y GASTRIC BYPASS WITH UPPER ENDOSCOPY
Anesthesia: General | Site: Abdomen

## 2014-09-02 MED ORDER — SODIUM CHLORIDE 0.9 % IJ SOLN
INTRAMUSCULAR | Status: DC | PRN
  Administered 2014-09-02: 10 mL

## 2014-09-02 MED ORDER — LIDOCAINE HCL (CARDIAC) 20 MG/ML IV SOLN
INTRAVENOUS | Status: DC | PRN
  Administered 2014-09-02: 75 mg via INTRAVENOUS

## 2014-09-02 MED ORDER — CHLORHEXIDINE GLUCONATE CLOTH 2 % EX PADS: 6.0000 | MEDICATED_PAD | Freq: Once | CUTANEOUS | Status: DC

## 2014-09-02 MED ORDER — UNJURY VANILLA POWDER
2.0000 [oz_av] | Freq: Four times a day (QID) | ORAL | Status: DC
  Administered 2014-09-04 – 2014-09-05 (×3): 2 [oz_av] via ORAL

## 2014-09-02 MED ORDER — FENTANYL CITRATE 0.05 MG/ML IJ SOLN
INTRAMUSCULAR | Status: AC
  Filled 2014-09-02: qty 5

## 2014-09-02 MED ORDER — CISATRACURIUM BESYLATE 20 MG/10ML IV SOLN
INTRAVENOUS | Status: AC
  Filled 2014-09-02: qty 10

## 2014-09-02 MED ORDER — MIDAZOLAM HCL 2 MG/2ML IJ SOLN
INTRAMUSCULAR | Status: AC
  Filled 2014-09-02: qty 2

## 2014-09-02 MED ORDER — HEPARIN SODIUM (PORCINE) 5000 UNIT/ML IJ SOLN
5000.0000 [IU] | INTRAMUSCULAR | Status: AC
  Administered 2014-09-02: 5000 [IU] via SUBCUTANEOUS
  Filled 2014-09-02: qty 1

## 2014-09-02 MED ORDER — CEFOXITIN SODIUM 2 G IV SOLR
2.0000 g | INTRAVENOUS | Status: AC
  Administered 2014-09-02 (×2): 2 g via INTRAVENOUS

## 2014-09-02 MED ORDER — UNJURY CHICKEN SOUP POWDER: 2.0000 [oz_av] | Freq: Four times a day (QID) | ORAL | Status: DC

## 2014-09-02 MED ORDER — ONDANSETRON HCL 4 MG/2ML IJ SOLN
INTRAMUSCULAR | Status: DC | PRN
  Administered 2014-09-02 (×2): 2 mg via INTRAVENOUS

## 2014-09-02 MED ORDER — PROMETHAZINE HCL 25 MG/ML IJ SOLN: 6.2500 mg | INTRAMUSCULAR | Status: DC | PRN

## 2014-09-02 MED ORDER — HYDROMORPHONE HCL 1 MG/ML IJ SOLN
0.2500 mg | INTRAMUSCULAR | Status: DC | PRN
  Administered 2014-09-02 (×2): 0.5 mg via INTRAVENOUS

## 2014-09-02 MED ORDER — SUCCINYLCHOLINE CHLORIDE 20 MG/ML IJ SOLN
INTRAMUSCULAR | Status: DC | PRN
  Administered 2014-09-02: 140 mg via INTRAVENOUS

## 2014-09-02 MED ORDER — ACETAMINOPHEN 160 MG/5ML PO SOLN
650.0000 mg | ORAL | Status: DC | PRN
  Administered 2014-09-05: 650 mg via ORAL
  Filled 2014-09-02: qty 20.3

## 2014-09-02 MED ORDER — CETYLPYRIDINIUM CHLORIDE 0.05 % MT LIQD
7.0000 mL | Freq: Two times a day (BID) | OROMUCOSAL | Status: DC
  Administered 2014-09-02 – 2014-09-05 (×6): 7 mL via OROMUCOSAL

## 2014-09-02 MED ORDER — HEPARIN SODIUM (PORCINE) 5000 UNIT/ML IJ SOLN
5000.0000 [IU] | Freq: Three times a day (TID) | INTRAMUSCULAR | Status: DC
  Administered 2014-09-02 – 2014-09-06 (×11): 5000 [IU] via SUBCUTANEOUS
  Filled 2014-09-02 (×14): qty 1

## 2014-09-02 MED ORDER — MORPHINE SULFATE 2 MG/ML IJ SOLN
2.0000 mg | INTRAMUSCULAR | Status: DC | PRN
  Administered 2014-09-02: 2 mg via INTRAVENOUS
  Administered 2014-09-02 – 2014-09-03 (×4): 4 mg via INTRAVENOUS
  Filled 2014-09-02 (×2): qty 2
  Filled 2014-09-02: qty 1
  Filled 2014-09-02 (×3): qty 2

## 2014-09-02 MED ORDER — UNJURY CHOCOLATE CLASSIC POWDER
2.0000 [oz_av] | Freq: Four times a day (QID) | ORAL | Status: DC
  Administered 2014-09-04 – 2014-09-06 (×5): 2 [oz_av] via ORAL

## 2014-09-02 MED ORDER — SODIUM CHLORIDE 0.9 % IJ SOLN
INTRAMUSCULAR | Status: AC
  Filled 2014-09-02: qty 10

## 2014-09-02 MED ORDER — DEXAMETHASONE SODIUM PHOSPHATE 10 MG/ML IJ SOLN
INTRAMUSCULAR | Status: DC | PRN
  Administered 2014-09-02: 10 mg via INTRAVENOUS

## 2014-09-02 MED ORDER — DEXTROSE 5 % IV SOLN
INTRAVENOUS | Status: AC
  Filled 2014-09-02: qty 2

## 2014-09-02 MED ORDER — PROPOFOL 10 MG/ML IV BOLUS
INTRAVENOUS | Status: AC
  Filled 2014-09-02: qty 20

## 2014-09-02 MED ORDER — NEOSTIGMINE METHYLSULFATE 10 MG/10ML IV SOLN
INTRAVENOUS | Status: DC | PRN
  Administered 2014-09-02: 4 mg via INTRAVENOUS

## 2014-09-02 MED ORDER — EVICEL 5 ML EX KIT
PACK | CUTANEOUS | Status: DC | PRN
  Administered 2014-09-02: 5 mL

## 2014-09-02 MED ORDER — CHLORHEXIDINE GLUCONATE 0.12 % MT SOLN
15.0000 mL | Freq: Two times a day (BID) | OROMUCOSAL | Status: DC
  Administered 2014-09-02 – 2014-09-06 (×8): 15 mL via OROMUCOSAL
  Filled 2014-09-02 (×10): qty 15

## 2014-09-02 MED ORDER — 0.9 % SODIUM CHLORIDE (POUR BTL) OPTIME
TOPICAL | Status: DC | PRN
  Administered 2014-09-02: 1000 mL

## 2014-09-02 MED ORDER — DEXAMETHASONE SODIUM PHOSPHATE 10 MG/ML IJ SOLN
INTRAMUSCULAR | Status: AC
  Filled 2014-09-02: qty 1

## 2014-09-02 MED ORDER — TISSEEL VH 10 ML EX KIT
PACK | CUTANEOUS | Status: AC
  Filled 2014-09-02: qty 2

## 2014-09-02 MED ORDER — CISATRACURIUM BESYLATE (PF) 10 MG/5ML IV SOLN
INTRAVENOUS | Status: DC | PRN
  Administered 2014-09-02 (×2): 4 mg via INTRAVENOUS
  Administered 2014-09-02: 10 mg via INTRAVENOUS
  Administered 2014-09-02 (×2): 2 mg via INTRAVENOUS
  Administered 2014-09-02: 6 mg via INTRAVENOUS

## 2014-09-02 MED ORDER — GLYCOPYRROLATE 0.2 MG/ML IJ SOLN
INTRAMUSCULAR | Status: AC
  Filled 2014-09-02: qty 2

## 2014-09-02 MED ORDER — GLYCOPYRROLATE 0.2 MG/ML IJ SOLN
INTRAMUSCULAR | Status: DC | PRN
  Administered 2014-09-02: .2 mg via INTRAVENOUS
  Administered 2014-09-02: 0.6 mg via INTRAVENOUS

## 2014-09-02 MED ORDER — BUPIVACAINE LIPOSOME 1.3 % IJ SUSP
20.0000 mL | Freq: Once | INTRAMUSCULAR | Status: DC
  Filled 2014-09-02: qty 20

## 2014-09-02 MED ORDER — LACTATED RINGERS IV SOLN
INTRAVENOUS | Status: DC | PRN
  Administered 2014-09-02: 11:00:00 via INTRAVENOUS

## 2014-09-02 MED ORDER — LACTATED RINGERS IR SOLN
Status: DC | PRN
  Administered 2014-09-02: 1000 mL

## 2014-09-02 MED ORDER — ONDANSETRON HCL 4 MG/2ML IJ SOLN
INTRAMUSCULAR | Status: AC
  Filled 2014-09-02: qty 2

## 2014-09-02 MED ORDER — NEOSTIGMINE METHYLSULFATE 10 MG/10ML IV SOLN
INTRAVENOUS | Status: AC
  Filled 2014-09-02: qty 1

## 2014-09-02 MED ORDER — METOPROLOL SUCCINATE ER 50 MG PO TB24
50.0000 mg | ORAL_TABLET | Freq: Every day | ORAL | Status: DC
  Administered 2014-09-03 – 2014-09-06 (×2): 50 mg via ORAL
  Filled 2014-09-02 (×4): qty 1

## 2014-09-02 MED ORDER — PROPOFOL 10 MG/ML IV BOLUS
INTRAVENOUS | Status: DC | PRN
  Administered 2014-09-02: 200 mg via INTRAVENOUS

## 2014-09-02 MED ORDER — HYDROMORPHONE HCL 1 MG/ML IJ SOLN
INTRAMUSCULAR | Status: AC
  Filled 2014-09-02: qty 1

## 2014-09-02 MED ORDER — LIDOCAINE HCL (CARDIAC) 20 MG/ML IV SOLN
INTRAVENOUS | Status: AC
  Filled 2014-09-02: qty 5

## 2014-09-02 MED ORDER — LIP MEDEX EX OINT
TOPICAL_OINTMENT | CUTANEOUS | Status: DC | PRN
  Filled 2014-09-02: qty 7

## 2014-09-02 MED ORDER — GLYCOPYRROLATE 0.2 MG/ML IJ SOLN
INTRAMUSCULAR | Status: AC
  Filled 2014-09-02: qty 1

## 2014-09-02 MED ORDER — KCL IN DEXTROSE-NACL 20-5-0.45 MEQ/L-%-% IV SOLN
INTRAVENOUS | Status: DC
  Administered 2014-09-02: 18:00:00 via INTRAVENOUS
  Administered 2014-09-03: 100 mL via INTRAVENOUS
  Administered 2014-09-03: 14:00:00 via INTRAVENOUS
  Administered 2014-09-03: 100 mL via INTRAVENOUS
  Administered 2014-09-04: 18:00:00 via INTRAVENOUS
  Administered 2014-09-05: 100 mL via INTRAVENOUS
  Administered 2014-09-05 – 2014-09-06 (×3): via INTRAVENOUS
  Filled 2014-09-02 (×10): qty 1000

## 2014-09-02 MED ORDER — FENTANYL CITRATE 0.05 MG/ML IJ SOLN
INTRAMUSCULAR | Status: DC | PRN
  Administered 2014-09-02: 100 ug via INTRAVENOUS
  Administered 2014-09-02 (×3): 50 ug via INTRAVENOUS
  Administered 2014-09-02 (×3): 100 ug via INTRAVENOUS
  Administered 2014-09-02: 50 ug via INTRAVENOUS

## 2014-09-02 MED ORDER — BUPIVACAINE LIPOSOME 1.3 % IJ SUSP
INTRAMUSCULAR | Status: DC | PRN
  Administered 2014-09-02: 20 mL

## 2014-09-02 MED ORDER — LACTATED RINGERS IV SOLN
INTRAVENOUS | Status: DC
  Administered 2014-09-02: 1000 mL via INTRAVENOUS

## 2014-09-02 MED ORDER — OXYCODONE HCL 5 MG/5ML PO SOLN
5.0000 mg | ORAL | Status: DC | PRN
  Administered 2014-09-03: 5 mg via ORAL
  Administered 2014-09-03 – 2014-09-04 (×5): 10 mg via ORAL
  Administered 2014-09-05: 5 mg via ORAL
  Administered 2014-09-05: 10 mg via ORAL
  Filled 2014-09-02: qty 5
  Filled 2014-09-02 (×2): qty 10
  Filled 2014-09-02: qty 5
  Filled 2014-09-02 (×4): qty 10

## 2014-09-02 MED ORDER — ONDANSETRON HCL 4 MG/2ML IJ SOLN
4.0000 mg | INTRAMUSCULAR | Status: DC | PRN
  Administered 2014-09-02 – 2014-09-05 (×9): 4 mg via INTRAVENOUS
  Filled 2014-09-02 (×10): qty 2

## 2014-09-02 MED ORDER — ACETAMINOPHEN 160 MG/5ML PO SOLN: 325.0000 mg | ORAL | Status: DC | PRN

## 2014-09-02 MED ORDER — MIDAZOLAM HCL 5 MG/5ML IJ SOLN
INTRAMUSCULAR | Status: DC | PRN
  Administered 2014-09-02 (×2): 1 mg via INTRAVENOUS

## 2014-09-02 MED ORDER — MEPERIDINE HCL 50 MG/ML IJ SOLN: 6.2500 mg | INTRAMUSCULAR | Status: DC | PRN

## 2014-09-02 MED ORDER — INFLUENZA VAC SPLIT QUAD 0.5 ML IM SUSY
0.5000 mL | PREFILLED_SYRINGE | INTRAMUSCULAR | Status: AC | PRN
  Administered 2014-09-06: 0.5 mL via INTRAMUSCULAR
  Filled 2014-09-02 (×2): qty 0.5

## 2014-09-02 SURGICAL SUPPLY — 72 items
APPLICATOR COTTON TIP 6IN STRL (MISCELLANEOUS) ×6 IMPLANT
BENZOIN TINCTURE PRP APPL 2/3 (GAUZE/BANDAGES/DRESSINGS) IMPLANT
BLADE SURG 15 STRL LF DISP TIS (BLADE) ×1 IMPLANT
BLADE SURG 15 STRL SS (BLADE) ×2
CABLE HIGH FREQUENCY MONO STRZ (ELECTRODE) IMPLANT
CANISTER SUCT 3000ML (MISCELLANEOUS) ×3 IMPLANT
CLIP SUT LAPRA TY ABSORB (SUTURE) ×3 IMPLANT
CLOSURE WOUND 1/2 X4 (GAUZE/BANDAGES/DRESSINGS)
DEVICE SUT QUICK LOAD TK 5 (STAPLE) IMPLANT
DEVICE SUT TI-KNOT TK 5X26 (MISCELLANEOUS) IMPLANT
DEVICE SUTURE ENDOST 10MM (ENDOMECHANICALS) ×3 IMPLANT
DEVICE TI KNOT TK5 (MISCELLANEOUS)
DISSECTOR BLUNT TIP ENDO 5MM (MISCELLANEOUS) IMPLANT
DRAIN PENROSE 18X1/4 LTX STRL (WOUND CARE) ×3 IMPLANT
DRAPE CAMERA CLOSED 9X96 (DRAPES) ×3 IMPLANT
GAUZE SPONGE 4X4 12PLY STRL (GAUZE/BANDAGES/DRESSINGS) ×3 IMPLANT
GAUZE SPONGE 4X4 16PLY XRAY LF (GAUZE/BANDAGES/DRESSINGS) ×3 IMPLANT
GLOVE BIOGEL M 8.0 STRL (GLOVE) ×3 IMPLANT
GLOVE BIOGEL PI IND STRL 7.5 (GLOVE) ×1 IMPLANT
GLOVE BIOGEL PI INDICATOR 7.5 (GLOVE) ×2
GLOVE SS BIOGEL STRL SZ 7.5 (GLOVE) ×2 IMPLANT
GLOVE SUPERSENSE BIOGEL SZ 7.5 (GLOVE) ×4
GOWN SPEC L4 XLG W/TWL (GOWN DISPOSABLE) ×3 IMPLANT
GOWN STRL REUS W/TWL XL LVL3 (GOWN DISPOSABLE) ×9 IMPLANT
HANDLE STAPLE EGIA 4 XL (STAPLE) ×3 IMPLANT
HOVERMATT SINGLE USE (MISCELLANEOUS) ×3 IMPLANT
KIT BASIN OR (CUSTOM PROCEDURE TRAY) ×3 IMPLANT
KIT GASTRIC LAVAGE 34FR ADT (SET/KITS/TRAYS/PACK) ×3 IMPLANT
NEEDLE SPNL 22GX3.5 QUINCKE BK (NEEDLE) ×3 IMPLANT
NS IRRIG 1000ML POUR BTL (IV SOLUTION) ×3 IMPLANT
PACK CARDIOVASCULAR III (CUSTOM PROCEDURE TRAY) ×3 IMPLANT
PEN SKIN MARKING BROAD (MISCELLANEOUS) ×3 IMPLANT
QUICK LOAD TK 5 (STAPLE)
RELOAD EGIA 45 MED/THCK PURPLE (STAPLE) ×6 IMPLANT
RELOAD EGIA 45 TAN VASC (STAPLE) ×6 IMPLANT
RELOAD EGIA 60 MED/THCK PURPLE (STAPLE) ×6 IMPLANT
RELOAD EGIA 60 TAN VASC (STAPLE) ×6 IMPLANT
RELOAD ENDO STITCH 2.0 (ENDOMECHANICALS) ×26
RELOAD TRI 45 ART MED THCK PUR (STAPLE) IMPLANT
RELOAD TRI 60 ART MED THCK PUR (STAPLE) ×6 IMPLANT
SCISSORS LAP 5X45 EPIX DISP (ENDOMECHANICALS) ×3 IMPLANT
SEALANT SURGICAL APPL DUAL CAN (MISCELLANEOUS) ×3 IMPLANT
SET IRRIG TUBING LAPAROSCOPIC (IRRIGATION / IRRIGATOR) ×3 IMPLANT
SHEARS CURVED HARMONIC AC 45CM (MISCELLANEOUS) ×3 IMPLANT
SLEEVE ADV FIXATION 12X100MM (TROCAR) ×9 IMPLANT
SLEEVE ADV FIXATION 5X100MM (TROCAR) IMPLANT
SOLUTION ANTI FOG 6CC (MISCELLANEOUS) ×3 IMPLANT
STAPLER VISISTAT 35W (STAPLE) ×3 IMPLANT
STRIP CLOSURE SKIN 1/2X4 (GAUZE/BANDAGES/DRESSINGS) IMPLANT
SUT DVC SILK 2.0X39 (SUTURE) ×6 IMPLANT
SUT RELOAD ENDO STITCH 2 48X1 (ENDOMECHANICALS) ×9
SUT RELOAD ENDO STITCH 2.0 (ENDOMECHANICALS) ×4
SUT SURGIDAC NAB ES-9 0 48 120 (SUTURE) IMPLANT
SUT VIC AB 2-0 SH 27 (SUTURE) ×2
SUT VIC AB 2-0 SH 27X BRD (SUTURE) ×1 IMPLANT
SUT VIC AB 4-0 SH 18 (SUTURE) ×3 IMPLANT
SUTURE RELOAD END STTCH 2 48X1 (ENDOMECHANICALS) ×9 IMPLANT
SUTURE RELOAD ENDO STITCH 2.0 (ENDOMECHANICALS) ×4 IMPLANT
SYR 20CC LL (SYRINGE) ×3 IMPLANT
SYR 30ML LL (SYRINGE) ×3 IMPLANT
SYR 50ML LL SCALE MARK (SYRINGE) ×3 IMPLANT
TOWEL OR 17X26 10 PK STRL BLUE (TOWEL DISPOSABLE) ×6 IMPLANT
TOWEL OR NON WOVEN STRL DISP B (DISPOSABLE) ×3 IMPLANT
TRAY FOLEY CATH 14FRSI W/METER (CATHETERS) ×3 IMPLANT
TROCAR ADV FIXATION 12X100MM (TROCAR) ×3 IMPLANT
TROCAR ADV FIXATION 5X100MM (TROCAR) ×3 IMPLANT
TROCAR BLADELESS OPT 5 100 (ENDOMECHANICALS) ×3 IMPLANT
TROCAR XCEL 12X100 BLDLESS (ENDOMECHANICALS) ×3 IMPLANT
TUBING CONNECTING 10 (TUBING) ×2 IMPLANT
TUBING CONNECTING 10' (TUBING) ×1
TUBING ENDO SMARTCAP PENTAX (MISCELLANEOUS) ×3 IMPLANT
TUBING FILTER THERMOFLATOR (ELECTROSURGICAL) ×3 IMPLANT

## 2014-09-02 NOTE — Transfer of Care (Signed)
Immediate Anesthesia Transfer of Care Note  Patient: Holly Alvarado  Procedure(s) Performed: Procedure(s): LAPAROSCOPIC ROUX-EN-Y GASTRIC BYPASS WITH UPPER ENDOSCOPY (N/A)  Patient Location: PACU  Anesthesia Type:General  Level of Consciousness: awake, alert  and oriented  Airway & Oxygen Therapy: Patient Spontanous Breathing and Patient connected to face mask oxygen  Post-op Assessment: Report given to PACU RN and Post -op Vital signs reviewed and stable  Post vital signs: Reviewed and stable  Complications: No apparent anesthesia complications

## 2014-09-02 NOTE — Anesthesia Procedure Notes (Signed)
Procedure Name: Intubation Date/Time: 09/02/2014 11:38 AM Performed by: Ofilia Neas Pre-anesthesia Checklist: Patient identified, Timeout performed, Emergency Drugs available, Suction available and Patient being monitored Patient Re-evaluated:Patient Re-evaluated prior to inductionOxygen Delivery Method: Circle system utilized Preoxygenation: Pre-oxygenation with 100% oxygen Intubation Type: IV induction and Cricoid Pressure applied Ventilation: Mask ventilation without difficulty Laryngoscope Size: Mac and 4 Grade View: Grade II Tube type: Oral Tube size: 7.5 mm Number of attempts: 1 Airway Equipment and Method: Stylet Placement Confirmation: ETT inserted through vocal cords under direct vision,  positive ETCO2 and breath sounds checked- equal and bilateral Secured at: 21 cm Tube secured with: Tape Dental Injury: Teeth and Oropharynx as per pre-operative assessment

## 2014-09-02 NOTE — Op Note (Signed)
Surgeon: Althea Grimmer. Hassell Done, MD, FACS Asst:  Adonis Housekeeper, MD, FACS Anesthesia: General endotracheal Drains: None  Procedure: Laparoscopic Roux en Y gastric bypass with 40 cm BP limb and 100 cm Roux limb, antecolic, antegastric, candy cane to the left.  Closure of Peterson's defect. Upper endoscopy.   Description of Procedure:  The patient was taken to OR 1 at [facility] and given general anesthesia.  Preop BMI was 56.   The abdomen was prepped with PCMX and draped sterilely.  A time out was performed. The abdomen was entered with 10 mm Optiview through the left upper quadrant.  Omentum was stuck to the umbilicus and this was taken down with the Harmonic scalpel and distal small bowel was noted to be up to the inferior anterior abdominal wall.     The operation began by identifying the ligament of Treitz. I measured 40 cm downstream and divided the bowel with a 6 cm Covidian stapler.  I sutured a Penrose drain along the Roux limb end.  I measured a 1 meter (100 cm) Roux limb and then placed the distal bowels to the BP limb side by side and performed a stapled jejunojejunostomy. The common defect was closed from either end with 4-0 Vicryl using the Endo Stitch. The mesenteric defect was closed with a running 2-0 silk using the Endo Stitch. Tisseel was applied to the suture line.  The omentum was divided with the harmonic scalpel.  The Nathanson retractor was inserted in the left lateral segment of liver was retracted. The foregut dissection ensued.  The lessor curvature was measured about 5 cm and retro gastric space was entered.  The pouch was created with multiple firings of the Covidein purple loads with the first two 6 cm loads were with reinforcement and the final ones had TRS applied.    The Roux limb was then brought up with the candycane pointed left and a back row of sutures of 2-0 Vicryl were placed. I opened along the right side of each structure and inserted the 4.5 cm stapler to create the  gastrojejunostomy. The common defect was closed from either end with 2-0 Vicryl and a second row was placed anterior to that the Ewald tube acting as a stent across the anastomosis. The Penrose drain was removed. Peterson's defect was closed with 2-0 silk.   Endoscopy was performed by Dr. Excell Seltzer.  This showed a 5 cm tubular pouch  The incisions were injected with Exparel and were closed with 4-0 Vicryl and staples.    The patient was taken to the recovery room in satisfactory condition.  Matt B. Hassell Done, MD, FACS

## 2014-09-02 NOTE — Anesthesia Preprocedure Evaluation (Addendum)
Anesthesia Evaluation  Patient identified by MRN, date of birth, ID band Patient awake    Reviewed: Allergy & Precautions, H&P , NPO status , Patient's Chart, lab work & pertinent test results, reviewed documented beta blocker date and time   History of Anesthesia Complications Negative for: history of anesthetic complications  Airway Mallampati: III  TM Distance: >3 FB Neck ROM: Full    Dental no notable dental hx.    Pulmonary neg pulmonary ROS,  breath sounds clear to auscultation  Pulmonary exam normal       Cardiovascular hypertension, Pt. on medications and Pt. on home beta blockers Rhythm:Regular Rate:Normal     Neuro/Psych PSYCHIATRIC DISORDERS Anxiety Depression negative neurological ROS     GI/Hepatic negative GI ROS, Neg liver ROS, GERD-  ,  Endo/Other  diabetes, Type 2  Renal/GU negative Renal ROS     Musculoskeletal negative musculoskeletal ROS (+)   Abdominal (+) + obese,   Peds  Hematology  (+) anemia ,   Anesthesia Other Findings   Reproductive/Obstetrics negative OB ROS                            Anesthesia Physical Anesthesia Plan  ASA: III  Anesthesia Plan: General   Post-op Pain Management:    Induction: Intravenous  Airway Management Planned: Oral ETT  Additional Equipment: None  Intra-op Plan:   Post-operative Plan: Extubation in OR  Informed Consent: I have reviewed the patients History and Physical, chart, labs and discussed the procedure including the risks, benefits and alternatives for the proposed anesthesia with the patient or authorized representative who has indicated his/her understanding and acceptance.   Dental advisory given  Plan Discussed with: CRNA  Anesthesia Plan Comments:         Anesthesia Quick Evaluation

## 2014-09-02 NOTE — H&P (View-Only) (Signed)
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 female is seen for a preop visit.  Her orders are in EPIC and all of her questions again answered regarding the surgery.  Past Medical History  Diagnosis Date  . Allergic rhinitis, seasonal   . Anxiety disorder   . GERD (gastroesophageal reflux disease)   . Obesity   . Hypertension   . Chronic constipation   . Hemorrhoids   . Esophageal polyp   . Fatty liver   . Complication of anesthesia     woke up during surgery of tubal ligation   . Diabetes mellitus without complication     prediabetic not on meds   . Anxiety     hx of panic attacks and panic attacks with surgery   . Anemia     hx of     Past Surgical History  Procedure Laterality Date  . Ectopic pregnancy surgery  1994, 1995  . Blt  1987  . Cholecystectomy  1997  . Total abdominal hysterectomy  03/11/08    for fibroids   . Mouth surgery      wisdom tooth extraction  . Partial hysterectomy    . Colonoscopy  12/30/2010  . Upper gastrointestinal endoscopy  12/30/2010  . Upper gastrointestinal endoscopy  05/11/04  . Upper gastrointestinal endoscopy  04/30/97    ANWAR  . Breast biopsy Right 2010    benign  . Breath tek h pylori N/A 02/25/2014    Procedure: BREATH TEK H PYLORI;  Surgeon: Jamarious Febo B Chiffon Kittleson, MD;  Location: [facility];  Service: General;  Laterality: N/A;  . Tubal ligation      Current Outpatient Prescriptions  Medication Sig Dispense Refill  . ALPRAZolam (XANAX) 0.25 MG tablet Take 0.25 mg by mouth at bedtime as needed for anxiety or sleep.    . amoxicillin-clarithromycin-lansoprazole (PREVPAC) combo pack Take by mouth 2 (two) times daily. Follow package directions. 1 kit 0  . aspirin (ASPIRIN LOW DOSE) 81 MG EC tablet Take 81 mg by mouth every morning.     . Calcium Carbonate Antacid (TUMS E-X PO) Take 2 tablets by mouth daily as needed (heart burn).    . Clobetasol Propionate 0.05 %  shampoo Use once weekly to  shampoo affected areas of scalp for 4 weeks, then as needed 118 mL 1  . clotrimazole-betamethasone (LOTRISONE) cream Apply twice daily to affected area (s) of scalp with rash for 10 days, then  as needed (Patient taking differently: Apply 1 application topically 2 (two) times daily as needed (rash on scalp.). ) 45 g 1  . diphenhydrAMINE (BENADRYL) 25 MG tablet Take 25-50 mg by mouth at bedtime as needed for allergies.    . ergocalciferol (VITAMIN D2) 50000 UNITS capsule Take 50,000 Units by mouth once a week. Tuesday.    . esomeprazole (NEXIUM) 40 MG capsule Take 40 mg by mouth 3 (three) times a week. At night.    . fluticasone (FLONASE) 50 MCG/ACT nasal spray Place 2 sprays into both nostrils daily as needed for allergies or rhinitis.    . ibuprofen (ADVIL) 200 MG tablet Take 200-400 mg by mouth every 6 (six) hours as needed (pain).     . loratadine (CLARITIN) 10 MG tablet Take 10 mg by mouth daily as needed for allergies.     . metoprolol succinate (TOPROL-XL) 50 MG 24 hr tablet Take 1 tablet (50 mg total) by mouth daily.   Take with or immediately following a meal. (Patient not taking: Reported on 08/28/2014) 30 tablet 5  . metoprolol succinate (TOPROL-XL) 50 MG 24 hr tablet TAKE 1 TABLET BY MOUTH TWICE A DAY WITH OR IMMEDIATELY FOLLOWING A MEAL 60 tablet 4  . venlafaxine XR (EFFEXOR-XR) 75 MG 24 hr capsule TAKE 1 CAPSULE BY MOUTH EVERY DAY (Patient not taking: Reported on 08/28/2014) 30 capsule 4  . venlafaxine XR (EFFEXOR-XR) 75 MG 24 hr capsule Take 75 mg by mouth every morning.     No current facility-administered medications for this visit.   Codeine and Sulfonamide derivatives Family History  Problem Relation Age of Onset  . Hypertension Mother   . COPD Mother   . Hyperlipidemia Father   . Hypertension Father   . Hyperlipidemia Sister   . Hypertension Sister   . Hyperlipidemia Sister   . Hypertension Sister   . Diabetes      family history   . Asthma       family history   . Arthritis      family history   . Stroke Other   . Cancer Other   . Heart disease Other    Social History:   reports that she has never smoked. She has never used smokeless tobacco. She reports that she does not drink alcohol or use illicit drugs.   REVIEW OF SYSTEMS : Negative except for C problem list  Physical Exam:   There were no vitals taken for this visit. There is no weight on file to calculate BMI.  Gen:  WDWN African-American female NAD  Neurological: Alert and oriented to person, place, and time. Motor and sensory function is grossly intact  Head: Normocephalic and atraumatic.  Eyes: Conjunctivae are normal. Pupils are equal, round, and reactive to light. No scleral icterus.  Neck: Normal range of motion. Neck supple. No tracheal deviation or thyromegaly present.  Cardiovascular:  SR without murmurs or gallops.  No carotid bruits Breast:  Not examined Respiratory: Effort normal.  No respiratory distress. No chest wall tenderness. Breath sounds normal.  No wheezes, rales or rhonchi.  Abdomen:  Obese with prior laparoscopic cholecystectomy incisions GU:  Not examined Musculoskeletal: Normal range of motion. Extremities are nontender. No cyanosis, edema or clubbing noted Lymphadenopathy: No cervical, preauricular, postauricular or axillary adenopathy is present Skin: Skin is warm and dry. No rash noted. No diaphoresis. No erythema. No pallor. Pscyh: Normal mood and affect. Behavior is normal. Judgment and thought content normal.   LABORATORY RESULTS: Results for orders placed or performed during the hospital encounter of 08/28/14 (from the past 48 hour(s))  CBC     Status: Abnormal   Collection Time: 08/28/14  9:25 AM  Result Value Ref Range   WBC 5.9 4.0 - 10.5 K/uL   RBC 5.69 (H) 3.87 - 5.11 MIL/uL   Hemoglobin 13.6 12.0 - 15.0 g/dL   HCT 42.2 36.0 - 46.0 %   MCV 74.2 (L) 78.0 - 100.0 fL   MCH 23.9 (L) 26.0 - 34.0 pg   MCHC 32.2 30.0 - 36.0 g/dL    RDW 15.5 11.5 - 15.5 %   Platelets 141 (L) 150 - 400 K/uL  Basic metabolic panel     Status: Abnormal   Collection Time: 08/28/14  9:25 AM  Result Value Ref Range   Sodium 138 137 - 147 mEq/L   Potassium 4.3 3.7 - 5.3 mEq/L   Chloride 104 96 - 112 mEq/L   CO2 24 19 - 32 mEq/L     Glucose, Bld 124 (H) 70 - 99 mg/dL   BUN 9 6 - 23 mg/dL   Creatinine, Ser 0.62 0.50 - 1.10 mg/dL   Calcium 9.3 8.4 - 10.5 mg/dL   GFR calc non Af Amer >90 >90 mL/min   GFR calc Af Amer >90 >90 mL/min    Comment: (NOTE) The eGFR has been calculated using the CKD EPI equation. This calculation has not been validated in all clinical situations. eGFR's persistently <90 mL/min signify possible Chronic Kidney Disease.    Anion gap 10 5 - 15     RADIOLOGY RESULTS: No results found.  Problem List: Patient Active Problem List   Diagnosis Date Noted  . Seborrheic dermatitis of scalp 06/13/2014  . Tinea capitis 06/13/2014  . Lymphocytosis 09/10/2013  . Colon polyps,hyperplastic, sigmoid 09/10/2013  . Steatosis of liver 09/10/2013  . 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 Positive serology for Helicobacter pylori 08/14/2013  . Abdominal pain, epigastric 08/13/2013  . Metabolic syndrome X 05/21/2013  . Pain of right great toe 05/17/2013  . Elevated LFTs 05/15/2013  . Unspecified vitamin D deficiency 05/11/2013  . Prediabetes 05/10/2013  . Depression 08/07/2012  . Joint pain 07/25/2012  . Carotid bruit 10/07/2011  . Left ankle pain 06/22/2009  . Essential hypertension 06/18/2008  . Morbid obesity with BMI of 50.0-59.9, adult 02/20/2008  . GENERALIZED ANXIETY DISORDER 02/20/2008  . ALLERGIC RHINITIS, SEASONAL 02/20/2008  . GERD 02/20/2008    Assessment & Plan: Morbid obesity for Roux-en-Y gastric bypass on December 7.  Bowel prep and postoperative pain meds given to her.    Matt B. Terrilyn Tyner, MD, FACS  [facility] [phone] beeper [phone]  08/28/2014 5:27  PM      

## 2014-09-02 NOTE — Interval H&P Note (Signed)
History and Physical Interval Note:  09/02/2014 10:35 AM  Holly Alvarado  has presented today for surgery, with the diagnosis of Morbid Obesity  The various methods of treatment have been discussed with the patient and family. After consideration of risks, benefits and other options for treatment, the patient has consented to  Procedure(s): LAPAROSCOPIC ROUX-EN-Y GASTRIC BYPASS WITH UPPER ENDOSCOPY-POSSIBLE HIATAL HERNIA (N/A) as a surgical intervention .  The patient's history has been reviewed, patient examined, no change in status, stable for surgery.  I have reviewed the patient's chart and labs.  Questions were answered to the patient's satisfaction.     Bonner Larue B

## 2014-09-03 ENCOUNTER — Encounter (HOSPITAL_COMMUNITY): Payer: Self-pay | Admitting: Surgery

## 2014-09-03 ENCOUNTER — Inpatient Hospital Stay (HOSPITAL_COMMUNITY): Payer: BC Managed Care – PPO

## 2014-09-03 LAB — CBC WITH DIFFERENTIAL/PLATELET
BASOS ABS: 0 10*3/uL (ref 0.0–0.1)
BASOS PCT: 0 % (ref 0–1)
EOS ABS: 0 10*3/uL (ref 0.0–0.7)
Eosinophils Relative: 0 % (ref 0–5)
HCT: 33.8 % — ABNORMAL LOW (ref 36.0–46.0)
Hemoglobin: 10.7 g/dL — ABNORMAL LOW (ref 12.0–15.0)
Lymphocytes Relative: 30 % (ref 12–46)
Lymphs Abs: 2.6 10*3/uL (ref 0.7–4.0)
MCH: 23.4 pg — ABNORMAL LOW (ref 26.0–34.0)
MCHC: 31.7 g/dL (ref 30.0–36.0)
MCV: 74 fL — ABNORMAL LOW (ref 78.0–100.0)
MONOS PCT: 5 % (ref 3–12)
Monocytes Absolute: 0.4 10*3/uL (ref 0.1–1.0)
NEUTROS ABS: 5.9 10*3/uL (ref 1.7–7.7)
Neutrophils Relative %: 65 % (ref 43–77)
PLATELETS: 172 10*3/uL (ref 150–400)
RBC: 4.57 MIL/uL (ref 3.87–5.11)
RDW: 15.8 % — AB (ref 11.5–15.5)
WBC: 8.9 10*3/uL (ref 4.0–10.5)

## 2014-09-03 LAB — HEMOGLOBIN AND HEMATOCRIT, BLOOD
HCT: 33.2 % — ABNORMAL LOW (ref 36.0–46.0)
Hemoglobin: 10.7 g/dL — ABNORMAL LOW (ref 12.0–15.0)

## 2014-09-03 MED ORDER — IOHEXOL 300 MG/ML  SOLN
50.0000 mL | Freq: Once | INTRAMUSCULAR | Status: AC | PRN
  Administered 2014-09-03: 15 mL via ORAL

## 2014-09-03 NOTE — Care Management Note (Signed)
    Page 1 of 1   09/03/2014     11:31:08 AM CARE MANAGEMENT NOTE 09/03/2014  Patient:  Holly Alvarado, Holly Alvarado   Account Number:  [id number]  Date Initiated:  09/03/2014  Documentation initiated by:  Sunday Spillers  Subjective/Objective Assessment:   48 yo female admitted s/p gastric bypass. PTA lived at home with spouse.     Action/Plan:   Home when stable   Anticipated DC Date:  09/05/2014   Anticipated DC Plan:  [place]  CM consult      Choice offered to / List presented to:             Status of service:  Completed, signed off Medicare Important Message given?   (If response is "NO", the following Medicare IM given date fields will be blank) Date Medicare IM given:   Medicare IM given by:   Date Additional Medicare IM given:   Additional Medicare IM given by:    Discharge Disposition:  HOME/SELF CARE  Per UR Regulation:  Reviewed for med. necessity/level of care/duration of stay  If discussed at [place] of Stay Meetings, dates discussed:    Comments:

## 2014-09-03 NOTE — Discharge Instructions (Signed)

## 2014-09-03 NOTE — Progress Notes (Signed)
Patient alert and oriented, Post op day 1.  Provided support and encouragement.  Encouraged pulmonary toilet, ambulation and small sips of liquids.  All questions answered.  Will continue to monitor. 

## 2014-09-03 NOTE — Progress Notes (Signed)
Patient ID: Holly Alvarado, female   DOB: 08-13-1966, 48 y.o.   MRN: [id number] [facility] Progress Note:   1 Day Post-Op  Subjective: Mental status is clear.   Objective: Vital signs in last 24 hours: Temp:  [97.6 F (36.4 C)-98.1 F (36.7 C)] 98 F (36.7 C) (12/08 1310) Pulse Rate:  [61-75] 65 (12/08 1310) Resp:  [13-21] 18 (12/08 1310) BP: (102-170)/(55-85) 125/65 mmHg (12/08 1310) SpO2:  [97 %-100 %] 98 % (12/08 1310)  Intake/Output from previous day: 12/07 0701 - 12/08 0700 In: 3100 [I.V.:3100] Out: 875 [Urine:825; Blood:50] Intake/Output this shift: Total I/O In: 0  Out: 1000 [Urine:1000]  Physical Exam: Work of breathing is normal.  Incisions ok  Lab Results:  Results for orders placed or performed during the hospital encounter of 09/02/14 (from the past 48 hour(s))  Glucose, capillary     Status: None   Collection Time: 09/02/14 10:06 AM  Result Value Ref Range   Glucose-Capillary 88 70 - 99 mg/dL   Comment 1 Notify RN   Glucose, capillary     Status: Abnormal   Collection Time: 09/02/14  3:17 PM  Result Value Ref Range   Glucose-Capillary 135 (H) 70 - 99 mg/dL   Comment 1 Documented in Chart    Comment 2 Notify RN   Hemoglobin and hematocrit, blood     Status: None   Collection Time: 09/02/14  3:35 PM  Result Value Ref Range   Hemoglobin 12.1 12.0 - 15.0 g/dL   HCT 38.5 36.0 - 46.0 %  CBC     Status: Abnormal   Collection Time: 09/02/14  3:35 PM  Result Value Ref Range   WBC 10.1 4.0 - 10.5 K/uL   RBC 5.34 (H) 3.87 - 5.11 MIL/uL   Hemoglobin 12.8 12.0 - 15.0 g/dL   HCT 39.4 36.0 - 46.0 %   MCV 73.8 (L) 78.0 - 100.0 fL   MCH 24.0 (L) 26.0 - 34.0 pg   MCHC 32.5 30.0 - 36.0 g/dL   RDW 15.6 (H) 11.5 - 15.5 %   Platelets 141 (L) 150 - 400 K/uL  Creatinine, serum     Status: None   Collection Time: 09/02/14  3:35 PM  Result Value Ref Range   Creatinine, Ser 0.70 0.50 - 1.10 mg/dL   GFR calc non Af Amer >90 >90 mL/min   GFR calc Af Amer >90  >90 mL/min    Comment: (NOTE) The eGFR has been calculated using the CKD EPI equation. This calculation has not been validated in all clinical situations. eGFR's persistently <90 mL/min signify possible Chronic Kidney Disease.   CBC WITH DIFFERENTIAL     Status: Abnormal   Collection Time: 09/03/14  4:40 AM  Result Value Ref Range   WBC 8.9 4.0 - 10.5 K/uL   RBC 4.57 3.87 - 5.11 MIL/uL   Hemoglobin 10.7 (L) 12.0 - 15.0 g/dL   HCT 33.8 (L) 36.0 - 46.0 %   MCV 74.0 (L) 78.0 - 100.0 fL   MCH 23.4 (L) 26.0 - 34.0 pg   MCHC 31.7 30.0 - 36.0 g/dL   RDW 15.8 (H) 11.5 - 15.5 %   Platelets 172 150 - 400 K/uL   Neutrophils Relative % 65 43 - 77 %   Neutro Abs 5.9 1.7 - 7.7 K/uL   Lymphocytes Relative 30 12 - 46 %   Lymphs Abs 2.6 0.7 - 4.0 K/uL   Monocytes Relative 5 3 - 12 %   Monocytes Absolute  0.4 0.1 - 1.0 K/uL   Eosinophils Relative 0 0 - 5 %   Eosinophils Absolute 0.0 0.0 - 0.7 K/uL   Basophils Relative 0 0 - 1 %   Basophils Absolute 0.0 0.0 - 0.1 K/uL    Radiology/Results: Dg Ugi W/water Sol Cm  09/03/2014   CLINICAL DATA:  Postop gastric bypass. Roux-en-Y a bariatric surgery  EXAM: WATER SOLUBLE UPPER GI SERIES  TECHNIQUE: Single-column upper GI series was performed using water soluble contrast.  CONTRAST:  32m OMNIPAQUE IOHEXOL 300 MG/ML  SOLN  COMPARISON:  Upper GI 03/11/2014  FLUOROSCOPY TIME:  1 min 49 is seconds  FINDINGS: No contrast flowed readily from the esophagus into the gastric remnant. Moderate delay of flow through the gastrojejunostomy but ultimately contrast flowed through the jejunostomy without evidence of leak or obstruction. Mild stasis of oral water-soluble contrast within the esophagus.  IMPRESSION: No evidence of obstruction or leak following Roux-en-Y bariatric surgery.   Electronically Signed   By: SSuzy BouchardM.D.   On: 09/03/2014 10:49    Anti-infectives: Anti-infectives    Start     Dose/Rate Route Frequency Ordered Stop   09/02/14 0959   cefOXitin (MEFOXIN) 2 g in dextrose 5 % 50 mL IVPB     2 g100 mL/hr over 30 Minutes Intravenous On call to O.R. 09/02/14 0959 09/02/14 1254      Assessment/Plan: Problem List: Patient Active Problem List   Diagnosis Date Noted  . S/P gastric bypass 09/02/2014  . Seborrheic dermatitis of scalp 06/13/2014  . Tinea capitis 06/13/2014  . Lymphocytosis 09/10/2013  . Colon polyps,hyperplastic, sigmoid 09/10/2013  . Steatosis of liver 09/10/2013  . 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 Positive serology for Helicobacter pylori 114/99/6924 . Abdominal pain, epigastric 08/13/2013  . Metabolic syndrome X 093/24/1991 . Pain of right great toe 05/17/2013  . Elevated LFTs 05/15/2013  . Unspecified vitamin D deficiency 05/11/2013  . Prediabetes 05/10/2013  . Depression 08/07/2012  . Joint pain 07/25/2012  . Carotid bruit 10/07/2011  . Left ankle pain 06/22/2009  . Essential hypertension 06/18/2008  . Morbid obesity with BMI of 50.0-59.9, adult 02/20/2008  . GENERALIZED ANXIETY DISORDER 02/20/2008  . ALLERGIC RHINITIS, SEASONAL 02/20/2008  . GERD 02/20/2008    UGI ok.  Will advance to PD 1 diet.  1 Day Post-Op    LOS: 1 day   Matt B. MHassell Done MD, [facility] [phone] [phone] 09/03/2014 3:32 PM

## 2014-09-03 NOTE — Anesthesia Postprocedure Evaluation (Signed)
Anesthesia Post Note  Patient: Holly Alvarado  Procedure(s) Performed: Procedure(s) (LRB): LAPAROSCOPIC ROUX-EN-Y GASTRIC BYPASS WITH UPPER ENDOSCOPY (N/A)  Anesthesia type: General  Patient location: PACU  Post pain: Pain level controlled  Post assessment: Post-op Vital signs reviewed  Last Vitals: BP 106/60 mmHg  Pulse 75  Temp(Src) 36.7 C (Oral)  Resp 16  Ht 5' 2"  (1.575 m)  Wt 304 lb 8 oz (138.12 kg)  BMI 55.68 kg/m2  SpO2 97%  Post vital signs: Reviewed  Level of consciousness: sedated  Complications: No apparent anesthesia complications

## 2014-09-03 NOTE — Plan of Care (Signed)
Problem: Food- and Nutrition-Related Knowledge Deficit (NB-1.1) Goal: Nutrition education Formal process to instruct or train a patient/client in a skill or to impart knowledge to help patients/clients voluntarily manage or modify food choices and eating behavior to maintain or improve health. Outcome: Completed/Met Date Met:  09/03/14 Nutrition Education Note  Received consult for diet education per DROP protocol.   12/7 s/p LAPAROSCOPIC ROUX-EN-Y GASTRIC BYPASS WITH UPPER ENDOSCOPY   Discussed 2 week post op diet with pt. Emphasized that liquids must be non carbonated, non caffeinated, and sugar free. Fluid goals discussed. Pt to follow up with outpatient bariatric RD for further diet progression after 2 weeks. Multivitamins and minerals also reviewed. Teach back method used, pt expressed understanding, expect good compliance.   Diet: First 2 Weeks  You will see the nutritionist about two (2) weeks after your surgery. The nutritionist will increase the types of foods you can eat if you are handling liquids well:  If you have severe vomiting or nausea and cannot handle clear liquids lasting longer than 1 day, call your surgeon  Protein Shake  Drink at least 2 ounces of shake 5-6 times per day  Each serving of protein shakes (usually 8 - 12 ounces) should have a minimum of:  15 grams of protein  And no more than 5 grams of carbohydrate  Goal for protein each day:  Men = 80 grams per day  Women = 60 grams per day  Protein powder may be added to fluids such as non-fat milk or Lactaid milk or Soy milk (limit to 35 grams added protein powder per serving)   Hydration  Slowly increase the amount of water and other clear liquids as tolerated (See Acceptable Fluids)  Slowly increase the amount of protein shake as tolerated  Sip fluids slowly and throughout the day  May use sugar substitutes in small amounts (no more than 6 - 8 packets per day; i.e. Splenda)   Fluid Goal  The first goal is to  drink at least 8 ounces of protein shake/drink per day (or as directed by the nutritionist); some examples of protein shakes are Syntrax Nectar, Adkins Advantage, EAS Edge HP, and Unjury. See handout from pre-op Bariatric Education Class:  Slowly increase the amount of protein shake you drink as tolerated  You may find it easier to slowly sip shakes throughout the day  It is important to get your proteins in first  Your fluid goal is to drink 64 - 100 ounces of fluid daily  It may take a few weeks to build up to this  32 oz (or more) should be clear liquids  And  32 oz (or more) should be full liquids (see below for examples)  Liquids should not contain sugar, caffeine, or carbonation   Clear Liquids:  Water or Sugar-free flavored water (i.e. Fruit H2O, Propel)  Decaffeinated coffee or tea (sugar-free)  Crystal Lite, Wyler's Lite, Minute Maid Lite  Sugar-free Jell-O  Bouillon or broth  Sugar-free Popsicle: *Less than 20 calories each; Limit 1 per day   Full Liquids:  Protein Shakes/Drinks + 2 choices per day of other full liquids  Full liquids must be:  No More Than 12 grams of Carbs per serving  No More Than 3 grams of Fat per serving  Strained low-fat cream soup  Non-Fat milk  Fat-free Lactaid Milk  Sugar-free yogurt (Dannon Lite & Fit, Greek yogurt)     Dhanvin Szeto, MS, RD, LDN Pager: [phone] After Hours Pager: [phone]        

## 2014-09-03 NOTE — Plan of Care (Signed)
Problem: Phase I Progression Outcomes Goal: Pain controlled with appropriate interventions Outcome: Completed/Met Date Met:  09/03/14 Goal: OOB as tolerated unless otherwise ordered Outcome: Completed/Met Date Met:  09/03/14 Goal: Voiding-avoid urinary catheter unless indicated Outcome: Completed/Met Date Met:  09/03/14 Goal: Hemodynamically stable Outcome: Completed/Met Date Met:  09/03/14 Goal: Pulmonary function stable Outcome: Progressing Goal: Diet - NPO Outcome: Completed/Met Date Met:  09/03/14

## 2014-09-03 NOTE — Progress Notes (Signed)
Patient alert and oriented, pain is controlled. Patient is tolerating fluids, plan to advance to protein shake tomorrow. Reviewed Gastric Bypass discharge instructions with patient and patient is able to articulate understanding. Provided information on BELT program, Support Group and [facility] outpatient pharmacy. All questions answered, will continue to monitor.

## 2014-09-04 LAB — CBC WITH DIFFERENTIAL/PLATELET
BASOS PCT: 0 % (ref 0–1)
Basophils Absolute: 0 10*3/uL (ref 0.0–0.1)
EOS ABS: 0 10*3/uL (ref 0.0–0.7)
Eosinophils Relative: 0 % (ref 0–5)
HEMATOCRIT: 30.9 % — AB (ref 36.0–46.0)
HEMOGLOBIN: 9.7 g/dL — AB (ref 12.0–15.0)
Lymphocytes Relative: 45 % (ref 12–46)
Lymphs Abs: 6.9 10*3/uL — ABNORMAL HIGH (ref 0.7–4.0)
MCH: 23.8 pg — ABNORMAL LOW (ref 26.0–34.0)
MCHC: 31.4 g/dL (ref 30.0–36.0)
MCV: 75.7 fL — ABNORMAL LOW (ref 78.0–100.0)
MONO ABS: 1.1 10*3/uL — AB (ref 0.1–1.0)
Monocytes Relative: 7 % (ref 3–12)
NEUTROS PCT: 48 % (ref 43–77)
Neutro Abs: 7.3 10*3/uL (ref 1.7–7.7)
Platelets: 162 10*3/uL (ref 150–400)
RBC: 4.08 MIL/uL (ref 3.87–5.11)
RDW: 16.1 % — AB (ref 11.5–15.5)
WBC: 15.3 10*3/uL — ABNORMAL HIGH (ref 4.0–10.5)

## 2014-09-04 LAB — PATHOLOGIST SMEAR REVIEW

## 2014-09-04 NOTE — Progress Notes (Signed)
Dressing changed to left upper quadrant of patient's abdomen, incision draining bright red blood from the site, bariatric nurse [place] notified Neta Mends RN 09-04-2014 9:22am

## 2014-09-04 NOTE — Progress Notes (Signed)
Patient alert and oriented, Post op day 2.  Provided support and encouragement.  Encouraged pulmonary toilet, ambulation and small sips of liquids.  Patient's WBC count is rising, HGB dropped over night and has area of drainage from incision that appears bright red.  Concerned with combination of symptoms and last BP lower than baseline, will get another pressure and reevaluate.  All questions answered.  Will continue to monitor.

## 2014-09-04 NOTE — Plan of Care (Signed)
Problem: Phase I Progression Outcomes Goal: Pulmonary function stable Outcome: Completed/Met Date Met:  09/04/14  Problem: Phase II Progression Outcomes Goal: Pain controlled on oral analgesia Outcome: Completed/Met Date Met:  09/04/14 Goal: Activity at appropriate level-compared to baseline (UP IN CHAIR FOR HEMODIALYSIS)  Outcome: Completed/Met Date Met:  09/04/14 Goal: Hemodynamically stable Outcome: Completed/Met Date Met:  09/04/14

## 2014-09-05 LAB — CBC WITH DIFFERENTIAL/PLATELET
BASOS ABS: 0 10*3/uL (ref 0.0–0.1)
Basophils Relative: 0 % (ref 0–1)
Eosinophils Absolute: 0.2 10*3/uL (ref 0.0–0.7)
Eosinophils Relative: 1 % (ref 0–5)
HEMATOCRIT: 30 % — AB (ref 36.0–46.0)
Hemoglobin: 9.6 g/dL — ABNORMAL LOW (ref 12.0–15.0)
LYMPHS ABS: 9.6 10*3/uL — AB (ref 0.7–4.0)
Lymphocytes Relative: 61 % — ABNORMAL HIGH (ref 12–46)
MCH: 24.1 pg — ABNORMAL LOW (ref 26.0–34.0)
MCHC: 32 g/dL (ref 30.0–36.0)
MCV: 75.2 fL — AB (ref 78.0–100.0)
MONO ABS: 0.9 10*3/uL (ref 0.1–1.0)
Monocytes Relative: 6 % (ref 3–12)
NEUTROS ABS: 5 10*3/uL (ref 1.7–7.7)
Neutrophils Relative %: 32 % — ABNORMAL LOW (ref 43–77)
Platelets: 173 10*3/uL (ref 150–400)
RBC: 3.99 MIL/uL (ref 3.87–5.11)
RDW: 15.9 % — ABNORMAL HIGH (ref 11.5–15.5)
WBC: 15.7 10*3/uL — ABNORMAL HIGH (ref 4.0–10.5)

## 2014-09-05 MED ORDER — VENLAFAXINE HCL 75 MG PO TABS
75.0000 mg | ORAL_TABLET | Freq: Every morning | ORAL | Status: DC
  Administered 2014-09-05 – 2014-09-06 (×2): 75 mg via ORAL
  Filled 2014-09-05 (×2): qty 1

## 2014-09-05 NOTE — Progress Notes (Signed)
Patient alert and oriented, Post op day 3.  Provided support and encouragement.  Encouraged pulmonary toilet, ambulation and small sips of liquids.  Encouraged patient to drink protein shake and water as tolerated.  Patient discussed that pain medicine is making her very tired, I suggested she try tylenol instead to determine if it would be adequate for pain control without the sedating effect of narcotic.  All questions answered.  Will continue to monitor.

## 2014-09-05 NOTE — Progress Notes (Signed)
Patient ID: Holly Alvarado, female   DOB: 28-May-1966, 48 y.o.   MRN: [id number] [facility] Surgery Progress Note:   3 Days Post-Op  Subjective: Mental status is clear.  Feeling better Objective: Vital signs in last 24 hours: Temp:  [97.8 F (36.6 C)-98.7 F (37.1 C)] 98 F (36.7 C) (12/10 1000) Pulse Rate:  [73-91] 77 (12/10 1000) Resp:  [18] 18 (12/10 1000) BP: (97-153)/(45-75) 128/45 mmHg (12/10 1000) SpO2:  [96 %-100 %] 100 % (12/10 1000) Weight:  [323 lb 13.7 oz (146.9 kg)] 323 lb 13.7 oz (146.9 kg) (12/09 1739)  Intake/Output from previous day: 12/09 0701 - 12/10 0700 In: 2453.3 [P.O.:60; I.V.:2393.3] Out: 2550 [Urine:2550] Intake/Output this shift: Total I/O In: 400 [I.V.:400] Out: 400 [Urine:400]  Physical Exam: Work of breathing is not labored.  Minimal incisional drainage.  Staples out and steri strips in place  Lab Results:  Results for orders placed or performed during the hospital encounter of 09/02/14 (from the past 48 hour(s))  Hemoglobin and hematocrit, blood     Status: Abnormal   Collection Time: 09/03/14  3:51 PM  Result Value Ref Range   Hemoglobin 10.7 (L) 12.0 - 15.0 g/dL   HCT 33.2 (L) 36.0 - 46.0 %  CBC with Differential     Status: Abnormal   Collection Time: 09/04/14  3:55 AM  Result Value Ref Range   WBC 15.3 (H) 4.0 - 10.5 K/uL   RBC 4.08 3.87 - 5.11 MIL/uL   Hemoglobin 9.7 (L) 12.0 - 15.0 g/dL   HCT 30.9 (L) 36.0 - 46.0 %   MCV 75.7 (L) 78.0 - 100.0 fL   MCH 23.8 (L) 26.0 - 34.0 pg   MCHC 31.4 30.0 - 36.0 g/dL   RDW 16.1 (H) 11.5 - 15.5 %   Platelets 162 150 - 400 K/uL   Neutrophils Relative % 48 43 - 77 %   Lymphocytes Relative 45 12 - 46 %   Monocytes Relative 7 3 - 12 %   Eosinophils Relative 0 0 - 5 %   Basophils Relative 0 0 - 1 %   Neutro Abs 7.3 1.7 - 7.7 K/uL   Lymphs Abs 6.9 (H) 0.7 - 4.0 K/uL   Monocytes Absolute 1.1 (H) 0.1 - 1.0 K/uL   Eosinophils Absolute 0.0 0.0 - 0.7 K/uL   Basophils Absolute 0.0 0.0 - 0.1 K/uL   RBC Morphology POLYCHROMASIA PRESENT     Comment: BASOPHILIC STIPPLING  Pathologist smear review     Status: None   Collection Time: 09/04/14  3:55 AM  Result Value Ref Range   Path Review Reviewed By Violet Baldy, M.D.     Comment: 12.9.15 Microcytic anemia,        Absolute lymphocytosis. Suggest immunophenotyping if a new, persistent finding.          CBC with Differential     Status: Abnormal   Collection Time: 09/05/14  4:11 AM  Result Value Ref Range   WBC 15.7 (H) 4.0 - 10.5 K/uL   RBC 3.99 3.87 - 5.11 MIL/uL   Hemoglobin 9.6 (L) 12.0 - 15.0 g/dL   HCT 30.0 (L) 36.0 - 46.0 %   MCV 75.2 (L) 78.0 - 100.0 fL   MCH 24.1 (L) 26.0 - 34.0 pg   MCHC 32.0 30.0 - 36.0 g/dL   RDW 15.9 (H) 11.5 - 15.5 %   Platelets 173 150 - 400 K/uL   Neutrophils Relative % 32 (L) 43 - 77 %   Lymphocytes  Relative 61 (H) 12 - 46 %   Monocytes Relative 6 3 - 12 %   Eosinophils Relative 1 0 - 5 %   Basophils Relative 0 0 - 1 %   Neutro Abs 5.0 1.7 - 7.7 K/uL   Lymphs Abs 9.6 (H) 0.7 - 4.0 K/uL   Monocytes Absolute 0.9 0.1 - 1.0 K/uL   Eosinophils Absolute 0.2 0.0 - 0.7 K/uL   Basophils Absolute 0.0 0.0 - 0.1 K/uL   RBC Morphology POLYCHROMASIA PRESENT     Comment: BASOPHILIC STIPPLING RARE NRBCs    WBC Morphology ABSOLUTE LYMPHOCYTOSIS     Radiology/Results: No results found.  Anti-infectives: Anti-infectives    Start     Dose/Rate Route Frequency Ordered Stop   09/02/14 0959  cefOXitin (MEFOXIN) 2 g in dextrose 5 % 50 mL IVPB     2 g100 mL/hr over 30 Minutes Intravenous On call to O.R. 09/02/14 0959 09/02/14 1254      Assessment/Plan: Problem List: Patient Active Problem List   Diagnosis Date Noted  . S/P gastric bypass 09/02/2014  . Seborrheic dermatitis of scalp 06/13/2014  . Tinea capitis 06/13/2014  . Lymphocytosis 09/10/2013  . Colon polyps,hyperplastic, sigmoid 09/10/2013  . Steatosis of liver 09/10/2013  . 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 Positive serology for  Helicobacter pylori 16/06/9603  . Abdominal pain, epigastric 08/13/2013  . Metabolic syndrome X 54/05/8118  . Pain of right great toe 05/17/2013  . Elevated LFTs 05/15/2013  . Unspecified vitamin D deficiency 05/11/2013  . Prediabetes 05/10/2013  . Depression 08/07/2012  . Joint pain 07/25/2012  . Carotid bruit 10/07/2011  . Left ankle pain 06/22/2009  . Essential hypertension 06/18/2008  . Morbid obesity with BMI of 50.0-59.9, adult 02/20/2008  . GENERALIZED ANXIETY DISORDER 02/20/2008  . ALLERGIC RHINITIS, SEASONAL 02/20/2008  . GERD 02/20/2008    WBC remains elevated. Normal pulse.  Asymptomatic.  Will observe and recheck tomorrow 3 Days Post-Op    LOS: 3 days   Matt B. Hassell Done, MD, [facility] [phone] beeper [phone]  09/05/2014 1:13 PM

## 2014-09-05 NOTE — Plan of Care (Signed)
Problem: Phase I Progression Outcomes Goal: Bariatric bed/trapeze per MD Outcome: Completed/Met Date Met:  09/05/14  Problem: Phase II Progression Outcomes Goal: Pulmonary function stable Outcome: Completed/Met Date Met:  09/05/14

## 2014-09-05 NOTE — Plan of Care (Signed)
Problem: Phase I Progression Outcomes Goal: Initial discharge plan identified Outcome: Progressing Goal: CPAP/BI-PAP utilized with sleeping per order Outcome: Not Applicable Date Met:  20/60/15 Goal: Operative site clean or minimal drainage Outcome: Completed/Met Date Met:  09/05/14  Problem: Phase II Progression Outcomes Goal: Upper GI SWALLOW ACCEPTABLE Outcome: Completed/Met Date Met:  09/05/14 Goal: Doppler acceptable Outcome: Not Applicable Date Met:  61/53/79 Goal: Tolerating diet advance to Outcome: Completed/Met Date Met:  09/05/14

## 2014-09-06 LAB — CBC WITH DIFFERENTIAL/PLATELET
Basophils Absolute: 0 10*3/uL (ref 0.0–0.1)
Basophils Relative: 0 % (ref 0–1)
EOS ABS: 0.2 10*3/uL (ref 0.0–0.7)
EOS PCT: 2 % (ref 0–5)
HCT: 29.9 % — ABNORMAL LOW (ref 36.0–46.0)
Hemoglobin: 9.6 g/dL — ABNORMAL LOW (ref 12.0–15.0)
Lymphocytes Relative: 58 % — ABNORMAL HIGH (ref 12–46)
Lymphs Abs: 5.1 10*3/uL — ABNORMAL HIGH (ref 0.7–4.0)
MCH: 23.6 pg — AB (ref 26.0–34.0)
MCHC: 32.1 g/dL (ref 30.0–36.0)
MCV: 73.6 fL — ABNORMAL LOW (ref 78.0–100.0)
Monocytes Absolute: 0.6 10*3/uL (ref 0.1–1.0)
Monocytes Relative: 7 % (ref 3–12)
Neutro Abs: 2.9 10*3/uL (ref 1.7–7.7)
Neutrophils Relative %: 33 % — ABNORMAL LOW (ref 43–77)
PLATELETS: 187 10*3/uL (ref 150–400)
RBC: 4.06 MIL/uL (ref 3.87–5.11)
RDW: 15.9 % — AB (ref 11.5–15.5)
WBC: 8.8 10*3/uL (ref 4.0–10.5)

## 2014-09-06 NOTE — Progress Notes (Signed)
Patient is alert and oriented, vital signs are stable, incisions still with small amount of bloody drainage to left upper quadrant site reinforced and dressing change prior to discharge, patient is tolerating her shakes without complaints of nausea or vomiting, pt with flatus and voiding adequately, will continue to monitor Neta Mends RN 09-06-2014 10:45pm

## 2014-09-06 NOTE — Plan of Care (Signed)
Problem: Phase II Progression Outcomes Goal: Surgical site without signs of infection Outcome: Completed/Met Date Met:  09/06/14 Goal: Discharge plan remains appropriate-arrangements made Outcome: Completed/Met Date Met:  09/06/14  Problem: Discharge Progression Outcomes Goal: Barriers To Progression Addressed/Resolved Outcome: Completed/Met Date Met:  09/06/14 Goal: Discharge plan in place and appropriate Outcome: Completed/Met Date Met:  09/06/14 Goal: Pain controlled with appropriate interventions Outcome: Completed/Met Date Met:  09/06/14 Goal: Hemodynamically stable Outcome: Completed/Met Date Met:  09/06/14 Goal: Pulmonary function stable Outcome: Completed/Met Date Met:  24/81/44 Goal: Complications resolved/controlled Outcome: Completed/Met Date Met:  09/06/14 Goal: Tolerates Bariatric Shake Diet Outcome: Completed/Met Date Met:  09/06/14 Goal: Tolerates PO analgesia Outcome: Completed/Met Date Met:  09/06/14 Goal: Activity appropriate for discharge plan Outcome: Completed/Met Date Met:  09/06/14 Goal: Other Discharge Outcomes/Goals Outcome: Not Applicable Date Met:  39/26/59

## 2014-09-06 NOTE — Plan of Care (Signed)
Problem: Phase I Progression Outcomes Goal: Initial discharge plan identified Outcome: Completed/Met Date Met:  09/06/14

## 2014-09-06 NOTE — Discharge Summary (Signed)
Physician Discharge Summary  Patient ID: Holly Alvarado MRN: [id number] DOB/AGE: January 11, 1966 48 y.o.  Admit date: 09/02/2014 Discharge date: 09/06/2014  Admission Diagnoses:  Morbid obesity  Discharge Diagnoses:  same  Active Problems:   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   Surgery:  Lap roux en y gastric bypass  Discharged Condition: improved  Hospital Course:   Had surgery.  PD 1 swallow looked ok.  Advanced.  Bumped WBC up without obvious source.  WBC down; feeling much better and ready to go home.    Consults: none  Significant Diagnostic Studies: UGI    Discharge Exam: Blood pressure 140/73, pulse 113, temperature 98.1 F (36.7 C), temperature source Oral, resp. rate 18, height 5' 2"  (1.575 m), weight 320 lb 1.7 oz (145.2 kg), SpO2 100 %. Incisions with staples out and steristrips applied.    Disposition: 01-Home or Self Care  Discharge Instructions    Call MD for:  difficulty breathing, headache or visual disturbances    Complete by:  As directed      Call MD for:  persistant nausea and vomiting    Complete by:  As directed      Call MD for:  redness, tenderness, or signs of infection (pain, swelling, redness, odor or green/yellow discharge around incision site)    Complete by:  As directed      Call MD for:  severe uncontrolled pain    Complete by:  As directed      Call MD for:  temperature >100.4    Complete by:  As directed      Diet - low sodium heart healthy    Complete by:  As directed      Discharge instructions    Complete by:  As directed   Follow bariatric diet     Increase activity slowly    Complete by:  As directed             Medication List    STOP taking these medications        ADVIL 200 MG tablet  Generic drug:  ibuprofen     amoxicillin-clarithromycin-lansoprazole combo pack  Commonly known as:  PREVPAC     ASPIRIN LOW DOSE 81 MG EC tablet  Generic drug:  aspirin     esomeprazole 40 MG capsule  Commonly known as:  NEXIUM     TUMS  E-X PO      TAKE these medications        ALPRAZolam 0.25 MG tablet  Commonly known as:  XANAX  Take 0.25 mg by mouth at bedtime as needed for anxiety or sleep.     Clobetasol Propionate 0.05 % shampoo  Use once weekly to  shampoo affected areas of scalp for 4 weeks, then as needed     clotrimazole-betamethasone cream  Commonly known as:  LOTRISONE  Apply twice daily to affected area (s) of scalp with rash for 10 days, then  as needed     diphenhydrAMINE 25 MG tablet  Commonly known as:  BENADRYL  Take 25-50 mg by mouth at bedtime as needed for allergies.     ergocalciferol 50000 UNITS capsule  Commonly known as:  VITAMIN D2  Take 50,000 Units by mouth once a week. Tuesday.     fluticasone 50 MCG/ACT nasal spray  Commonly known as:  FLONASE  Place 2 sprays into both nostrils daily as needed for allergies or rhinitis.     loratadine 10 MG tablet  Commonly known as:  CLARITIN  Take 10 mg by mouth daily as needed for allergies.     metoprolol succinate 50 MG 24 hr tablet  Commonly known as:  TOPROL-XL  Take 1 tablet (50 mg total) by mouth daily. Take with or immediately following a meal.     metoprolol succinate 50 MG 24 hr tablet  Commonly known as:  TOPROL-XL  TAKE 1 TABLET BY MOUTH TWICE A DAY WITH OR IMMEDIATELY FOLLOWING A MEAL     venlafaxine XR 75 MG 24 hr capsule  Commonly known as:  EFFEXOR-XR  Take 75 mg by mouth every morning.     venlafaxine XR 75 MG 24 hr capsule  Commonly known as:  EFFEXOR-XR  TAKE 1 CAPSULE BY MOUTH EVERY DAY           Follow-up Information    Follow up with Pedro Earls, MD.   Specialty:  General Surgery   Contact information:   [address] [place] [address] [phone]       Signed: Pedro Earls 09/06/2014, 8:54 AM

## 2014-09-09 ENCOUNTER — Telehealth (INDEPENDENT_AMBULATORY_CARE_PROVIDER_SITE_OTHER): Payer: Self-pay

## 2014-09-09 ENCOUNTER — Telehealth (HOSPITAL_COMMUNITY): Payer: Self-pay

## 2014-09-09 NOTE — Telephone Encounter (Signed)
Pt called this afternoon had RNY on 12/7, states that the top LT incision site continues to bleed. She keeps applying pressure but can not get it to stop bleeding. Also pt states that she believes she has oral thrush, her tongue is covered in white.  Please advise.

## 2014-09-09 NOTE — Telephone Encounter (Signed)
Made discharge phone call to patient per DROP protocol. Asking the following questions.    1. Do you have someone to care for you now that you are home?  yes 2. Are you having pain now that is not relieved by your pain medication?  no 3. Are you able to drink the recommended daily amount of fluids (48 ounces minimum/day) and protein (60-80 grams/day) as prescribed by the dietitian or nutritional counselor?  yes 4. Are you taking the vitamins and minerals as prescribed?  Not yet, buying them tomorrow 5. Do you have the "on call" number to contact your surgeon if you have a problem or question?  yes 6. Are your incisions free of redness, swelling or drainage? (If steri strips, address that these can fall off, shower as tolerated) the one at the top keeps bleeding, told patient to follow-up with Dr. Earlie Server office 7. Have your bowels moved since your surgery?  If not, are you passing gas?  No/yes 8. Are you up and walking 3-4 times per day?  yes    1. Do you have an appointment made to see your surgeon in the next month?  yes 2. Were you provided your discharge medications before your surgery or before you were discharged from the hospital and are you taking them without problem?  yes 3. Were you provided phone numbers to the clinic/surgeon's office?  yes 4. Did you watch the patient education video module in the (clinic, surgeon's office, etc.) before your surgery? yes 5. Do you have a discharge checklist that was provided to you in the hospital to reference with instructions on how to take care of yourself after surgery?  yes 6. Did you see a dietitian or nutritional counselor while you were in the hospital?  yes 7. Do you have an appointment to see a dietitian or nutritional counselor in the next month?  yes

## 2014-09-10 ENCOUNTER — Other Ambulatory Visit (INDEPENDENT_AMBULATORY_CARE_PROVIDER_SITE_OTHER): Payer: Self-pay | Admitting: Surgery

## 2014-09-10 ENCOUNTER — Other Ambulatory Visit (INDEPENDENT_AMBULATORY_CARE_PROVIDER_SITE_OTHER): Payer: Self-pay

## 2014-09-10 ENCOUNTER — Telehealth (HOSPITAL_COMMUNITY): Payer: Self-pay

## 2014-09-10 DIAGNOSIS — R5082 Postprocedural fever: Secondary | ICD-10-CM

## 2014-09-10 NOTE — Telephone Encounter (Addendum)
Patient called me back last night and left message as well as this morning.  Attempted to return call, no answer, left message for patient to return call.   After speaking with me yesterday the patient was encouraged to follow-up with [facility] and surgeon re:  Incision continues to bleed and patient has developed white coating on tongue (per patient, "I have thrush")    Patient called me back last night to let me know she had called [facility] and had not received a call back as of yet.  At this time patient has developed low grade temp ranging from 99.6-100.4, continues to have bloody drainage from one incision and continues to complain of white coating on tongue.    Left message for patient to return my call, will encourage her to reach back out to [facility], and will inform Dr. Hassell Done  11:44 - spoke with Dr. Hassell Done who requested the patient have a CBC to assess white count, followed up with [facility] to have labwork ordered.  Will continue to monitor

## 2014-09-11 LAB — CBC
HCT: 31.3 % — ABNORMAL LOW (ref 36.0–46.0)
Hemoglobin: 10.3 g/dL — ABNORMAL LOW (ref 12.0–15.0)
MCH: 23.7 pg — ABNORMAL LOW (ref 26.0–34.0)
MCHC: 32.9 g/dL (ref 30.0–36.0)
MCV: 72 fL — ABNORMAL LOW (ref 78.0–100.0)
MPV: 9.1 fL — ABNORMAL LOW (ref 9.4–12.4)
PLATELETS: 272 10*3/uL (ref 150–400)
RBC: 4.35 MIL/uL (ref 3.87–5.11)
RDW: 18.9 % — AB (ref 11.5–15.5)
WBC: 9.6 10*3/uL (ref 4.0–10.5)

## 2014-09-12 ENCOUNTER — Telehealth: Payer: Self-pay

## 2014-09-12 ENCOUNTER — Other Ambulatory Visit: Payer: Self-pay

## 2014-09-12 MED ORDER — VENLAFAXINE HCL 37.5 MG PO TABS: 37.5000 mg | ORAL_TABLET | Freq: Two times a day (BID) | ORAL | Status: DC

## 2014-09-12 NOTE — Telephone Encounter (Signed)
Change entered, pls send and let her know, also let her know I am happy she has had her surgery and  Though difficult, I am sure that she will do very well

## 2014-09-12 NOTE — Telephone Encounter (Signed)
Change in effexor entered per request

## 2014-09-12 NOTE — Telephone Encounter (Signed)
Patient aware and med sent to pharmacy.

## 2014-09-17 ENCOUNTER — Encounter: Payer: BC Managed Care – PPO | Attending: Surgery

## 2014-09-17 VITALS — Ht 62.0 in | Wt 298.0 lb

## 2014-09-17 DIAGNOSIS — Z713 Dietary counseling and surveillance: Secondary | ICD-10-CM | POA: Diagnosis not present

## 2014-09-17 DIAGNOSIS — Z6841 Body Mass Index (BMI) 40.0 and over, adult: Secondary | ICD-10-CM | POA: Insufficient documentation

## 2014-09-17 NOTE — Progress Notes (Signed)
Bariatric Class:  Appt start time: 1530 end time:  1630.  2 Week Post-Operative Nutrition Class  Patient was seen on 09/17/14 for Post-Operative Nutrition education at [facility].   Surgery date: 09/02/2014 Surgery type: RYGB Start weight at [facility]: 313 lbs on 05/30/14 Weight today: 298.0 lbs  Weight change: 17.5 lbs  TANITA  BODY COMP RESULTS  08/12/14 09/17/14   BMI (kg/m^2) 57.7 54.5   Fat Mass (lbs) 181 175.0   Fat Free Mass (lbs) 134.5 123.0   Total Body Water (lbs) 98.5 90.0    The following the learning objectives were met by the patient during this course:  Identifies Phase 3A (Soft, High Proteins) Dietary Goals and will begin from 2 weeks post-operatively to 2 months post-operatively  Identifies appropriate sources of fluids and proteins   States protein recommendations and appropriate sources post-operatively  Identifies the need for appropriate texture modifications, mastication, and bite sizes when consuming solids  Identifies appropriate multivitamin and calcium sources post-operatively  Describes the need for physical activity post-operatively and will follow MD recommendations  States when to call healthcare provider regarding medication questions or post-operative complications  Handouts given during class include:  Phase 3A: Soft, High Protein Diet Handout  Follow-Up Plan: Patient will follow-up at [facility] in 6 weeks for 2 month post-op nutrition visit for diet advancement per MD.

## 2014-09-24 ENCOUNTER — Other Ambulatory Visit (HOSPITAL_COMMUNITY): Payer: BC Managed Care – PPO

## 2014-09-25 NOTE — Progress Notes (Deleted)
Holly Nakayama, MD [address], [address] [place] [address]  S/P Gastric Bypass Recurrent H. Pylori B thalassemia trait Lymphocytosis FLOW CYTOMETRY REPORT INTERPRETATION Peripheral Blood Flow Cytometry - NO MONOCLONAL B-CELL POPULATION OR ABNORMAL T-CELL PHENOTYPE. Holly Males MD Pathologist, Electronic Signature (Case signed 09/11/2013)  CURRENT THERAPY:Observation  INTERVAL HISTORY: Holly Alvarado 48 y.o. female returns for   MEDICAL HISTORY: Past Medical History  Diagnosis Date  . Allergic rhinitis, seasonal   . Anxiety disorder   . GERD (gastroesophageal reflux disease)   . Obesity   . Hypertension   . Chronic constipation   . Hemorrhoids   . Esophageal polyp   . Fatty liver   . Complication of anesthesia     woke up during surgery of tubal ligation   . Diabetes mellitus without complication     prediabetic not on meds   . Anxiety     hx of panic attacks and panic attacks with surgery   . Anemia     hx of     has Morbid obesity with BMI of 50.0-59.9, adult; GENERALIZED ANXIETY DISORDER; Essential hypertension; ALLERGIC RHINITIS, SEASONAL; GERD; Left ankle pain; Carotid bruit; Joint pain; Depression; Prediabetes; Unspecified vitamin D deficiency; Elevated LFTs; Pain of right great toe; Metabolic syndrome X; Abdominal pain, epigastric; Positive serology for Helicobacter pylori; Lymphocytosis; Colon polyps,hyperplastic, sigmoid; Steatosis of liver; Thalassemia trait, beta; Seborrheic dermatitis of scalp; Tinea capitis; and 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 on her problem list.     No history exists.     is allergic to codeine and sulfonamide derivatives.  Ms. Tippens does not currently have medications on file.  SURGICAL HISTORY: Past Surgical History  Procedure Laterality Date  . Ectopic pregnancy surgery  1994, 1995  . Blt  1987  . Cholecystectomy  1997  . Total abdominal hysterectomy  03/11/08    for fibroids   . Mouth surgery      wisdom tooth  extraction  . Partial hysterectomy    . Colonoscopy  12/30/2010  . Upper gastrointestinal endoscopy  12/30/2010  . Upper gastrointestinal endoscopy  05/11/04  . Upper gastrointestinal endoscopy  04/30/97    ANWAR  . Breast biopsy Right 2010    benign  . Breath tek h pylori N/A 02/25/2014    Procedure: BREATH TEK H PYLORI;  Surgeon: Pedro Earls, MD;  Location: [facility];  Service: General;  Laterality: N/A;  . Tubal ligation    . Gastric roux-en-y N/A 09/02/2014    Procedure: LAPAROSCOPIC ROUX-EN-Y GASTRIC BYPASS WITH UPPER ENDOSCOPY;  Surgeon: Pedro Earls, MD;  Location: [facility];  Service: General;  Laterality: N/A;    SOCIAL HISTORY: History   Social History  . Marital Status: Married    Spouse Name: N/A    Number of Children: 2  . Years of Education: college   Occupational History  . Registered nurse and teacher     Social History Main Topics  . Smoking status: Never Smoker   . Smokeless tobacco: Never Used  . Alcohol Use: No  . Drug Use: No  . Sexual Activity: Yes    Birth Control/ Protection: Surgical   Other Topics Concern  . Not on file   Social History Narrative    FAMILY HISTORY: Family History  Problem Relation Age of Onset  . Hypertension Mother   . COPD Mother   . Hyperlipidemia Father   . Hypertension Father   . Hyperlipidemia Sister   . Hypertension Sister   .  Hyperlipidemia Sister   . Hypertension Sister   . Diabetes      family history   . Asthma      family history   . Arthritis      family history   . Stroke Other   . Cancer Other   . Heart disease Other     ROS  PHYSICAL EXAMINATION  ECOG PERFORMANCE STATUS: {CHL ONC ECOG [phone]}  There were no vitals filed for this visit.  Physical Exam  LABORATORY DATA:  CBC    Component Value Date/Time   WBC 9.6 09/10/2014 1528   RBC 4.35 09/10/2014 1528   RBC 5.79* 09/10/2013 1445   HGB 10.3* 09/10/2014 1528   HCT 31.3* 09/10/2014 1528   PLT 272 09/10/2014 1528     MCV 72.0* 09/10/2014 1528   MCH 23.7* 09/10/2014 1528   MCHC 32.9 09/10/2014 1528   RDW 18.9* 09/10/2014 1528   LYMPHSABS 5.1* 09/06/2014 0555   MONOABS 0.6 09/06/2014 0555   EOSABS 0.2 09/06/2014 0555   BASOSABS 0.0 09/06/2014 0555   CMP     Component Value Date/Time   NA 138 08/28/2014 0925   K 4.3 08/28/2014 0925   CL 104 08/28/2014 0925   CO2 24 08/28/2014 0925   GLUCOSE 124* 08/28/2014 0925   BUN 9 08/28/2014 0925   CREATININE 0.70 09/02/2014 1535   CREATININE 0.69 01/22/2014 1635   CALCIUM 9.3 08/28/2014 0925   PROT 9.2* 08/28/2014 0925   ALBUMIN 3.2* 08/28/2014 0925   AST 265* 08/28/2014 0925   ALT 142* 08/28/2014 0925   ALKPHOS 127* 08/28/2014 0925   BILITOT 1.1 08/28/2014 0925   GFRNONAA >90 09/02/2014 1535   GFRAA >90 09/02/2014 1535     PENDING LABS:   RADIOGRAPHIC STUDIES:  No results found.   PATHOLOGY:     ASSESSMENT and THERAPY PLAN:    No problem-specific assessment & plan notes found for this encounter.   All questions were answered. The patient knows to call the clinic with any problems, questions or concerns. We can certainly see the patient much sooner if necessary.   Molli Hazard 09/25/2014     This encounter was created in error - please disregard.

## 2014-09-26 ENCOUNTER — Ambulatory Visit (HOSPITAL_COMMUNITY): Payer: BC Managed Care – PPO | Admitting: Hematology & Oncology

## 2014-09-26 NOTE — Progress Notes (Signed)
Erroneous encounter

## 2014-09-30 ENCOUNTER — Encounter: Payer: Self-pay | Admitting: Family Medicine

## 2014-09-30 ENCOUNTER — Ambulatory Visit (INDEPENDENT_AMBULATORY_CARE_PROVIDER_SITE_OTHER): Payer: BC Managed Care – PPO | Admitting: Family Medicine

## 2014-09-30 VITALS — BP 118/80 | HR 87 | Resp 16 | Ht 62.0 in | Wt 290.0 lb

## 2014-09-30 DIAGNOSIS — E559 Vitamin D deficiency, unspecified: Secondary | ICD-10-CM

## 2014-09-30 DIAGNOSIS — R7309 Other abnormal glucose: Secondary | ICD-10-CM

## 2014-09-30 DIAGNOSIS — F411 Generalized anxiety disorder: Secondary | ICD-10-CM

## 2014-09-30 DIAGNOSIS — F329 Major depressive disorder, single episode, unspecified: Secondary | ICD-10-CM

## 2014-09-30 DIAGNOSIS — J301 Allergic rhinitis due to pollen: Secondary | ICD-10-CM

## 2014-09-30 DIAGNOSIS — J320 Chronic maxillary sinusitis: Secondary | ICD-10-CM

## 2014-09-30 DIAGNOSIS — R7303 Prediabetes: Secondary | ICD-10-CM

## 2014-09-30 DIAGNOSIS — F32A Depression, unspecified: Secondary | ICD-10-CM

## 2014-09-30 DIAGNOSIS — Z6841 Body Mass Index (BMI) 40.0 and over, adult: Secondary | ICD-10-CM

## 2014-09-30 DIAGNOSIS — D563 Thalassemia minor: Secondary | ICD-10-CM

## 2014-09-30 DIAGNOSIS — I10 Essential (primary) hypertension: Secondary | ICD-10-CM

## 2014-09-30 MED ORDER — AZITHROMYCIN 250 MG PO TABS: ORAL_TABLET | ORAL | Status: DC

## 2014-09-30 MED ORDER — MOMETASONE FUROATE 50 MCG/ACT NA SUSP: 2.0000 | Freq: Two times a day (BID) | NASAL | Status: DC

## 2014-09-30 MED ORDER — FLUCONAZOLE 150 MG PO TABS: ORAL_TABLET | ORAL | Status: DC

## 2014-09-30 MED ORDER — MONTELUKAST SODIUM 10 MG PO TABS: 10.0000 mg | ORAL_TABLET | Freq: Every day | ORAL | Status: DC

## 2014-09-30 NOTE — Patient Instructions (Addendum)
F/u in 3.5 month, call if you need me before  CBC, iron, folate and B12, fasting lipid, cmp, and vit D  This week please  Z pack prescribed for left maxillary sinus infection, also fluconazole if needed congrats on weight loss and commitment to regular exercise  Use moisturizing lotion/oil twice daily to skin, antifungal med to creases on back  All the best for  2016!

## 2014-09-30 NOTE — Progress Notes (Signed)
   Subjective:    Patient ID: Holly Alvarado, female    DOB: Aug 01, 1966, 49 y.o.   MRN: [id number]  HPI The PT is here for follow up and re-evaluation of chronic medical conditions, medication management and review of any available recent lab and radiology data.  Preventive health is updated, specifically  Cancer screening and Immunization.   Had bariatric surgery in December, no post op complications and doing extremely well with diet and exercise, she has already lost 30 pounds. 2 week h/o increased sinus pressure with thick yellow nasal drainage and intermittent chills C/o increased and uncontrolled allergy symptoms since past 4 to 6 weeks, but not using meds daily as prescribed     Review of Systems See HPI   Denies chest congestion, productive cough or wheezing. Denies chest pains, palpitations and leg swelling Denies abdominal pain, nausea, vomiting,diarrhea or constipation.   Denies dysuria, frequency, hesitancy or incontinence. Denies joint pain, swelling and limitation in mobility. Denies headaches, seizures, numbness, or tingling. Denies uncontrolled depression, anxiety or insomnia. C/o itchy rash and dryness in skin folds with weight loss      Objective:   Physical Exam BP 118/80 mmHg  Pulse 87  Resp 16  Ht 5' 2"  (1.575 m)  Wt 290 lb (131.543 kg)  BMI 53.03 kg/m2  SpO2 100% Patient alert and oriented and in no cardiopulmonary distress.  HEENT: No facial asymmetry, EOMI,   oropharynx pink and moist.  Neck supple no JVD, no mass. Left Maxillary sinus tender, TM clear, Nasal mucosa erythematous and edematous Chest: Clear to auscultation bilaterally.  CVS: S1, S2 no murmurs, no S3.Regular rate.  ABD: Soft non tender.   Ext: No edema  MS: Adequate ROM spine, shoulders, hips and knees.  Skin: Intact, hyperpigmentation and rash noted in skin folds where pt had excess fat  Psych: Good eye contact, normal affect. Memory intact not anxious or depressed  appearing.  CNS: CN 2-12 intact, power,  normal throughout.no focal deficits noted.        Assessment & Plan:  Chronic left maxillary sinusitis z pack prescribed   Morbid obesity with BMI of 50.0-59.9, adult Improved. Pt applauded on succesful weight loss through lifestyle change, and encouraged to continue same. Weight loss goal set for the next several months.    GENERALIZED ANXIETY DISORDER Improved and well controlled, successful surgery has had a positive effect   Essential hypertension Controlled, no change in medication DASH diet and commitment to daily physical activity for a minimum of 30 minutes discussed and encouraged, as a part of hypertension management. The importance of attaining a healthy weight is also discussed.    ALLERGIC RHINITIS, SEASONAL Increased symptoms with season change, pt reminded of the need to use preventive meds daily for effect   Depression Controlled, no change in medication Improved following recent surgery   Vitamin D deficiency Weekly vit D prescribed

## 2014-10-03 LAB — COMPREHENSIVE METABOLIC PANEL
ALBUMIN: 3.3 g/dL — AB (ref 3.5–5.2)
ALK PHOS: 86 U/L (ref 39–117)
ALT: 86 U/L — AB (ref 0–35)
AST: 153 U/L — ABNORMAL HIGH (ref 0–37)
BILIRUBIN TOTAL: 1.6 mg/dL — AB (ref 0.2–1.2)
BUN: 6 mg/dL (ref 6–23)
CALCIUM: 9.2 mg/dL (ref 8.4–10.5)
CHLORIDE: 106 meq/L (ref 96–112)
CO2: 24 mEq/L (ref 19–32)
Creat: 0.65 mg/dL (ref 0.50–1.10)
Glucose, Bld: 88 mg/dL (ref 70–99)
POTASSIUM: 3.6 meq/L (ref 3.5–5.3)
Sodium: 139 mEq/L (ref 135–145)
Total Protein: 8.1 g/dL (ref 6.0–8.3)

## 2014-10-03 LAB — CBC
HCT: 38.2 % (ref 36.0–46.0)
HEMOGLOBIN: 12.6 g/dL (ref 12.0–15.0)
MCH: 24.8 pg — AB (ref 26.0–34.0)
MCHC: 33 g/dL (ref 30.0–36.0)
MCV: 75 fL — AB (ref 78.0–100.0)
Platelets: 223 10*3/uL (ref 150–400)
RBC: 5.09 MIL/uL (ref 3.87–5.11)
RDW: 18.9 % — AB (ref 11.5–15.5)
WBC: 5.7 10*3/uL (ref 4.0–10.5)

## 2014-10-03 LAB — FERRITIN: FERRITIN: 463 ng/mL — AB (ref 10–291)

## 2014-10-03 LAB — IRON: IRON: 159 ug/dL — AB (ref 42–145)

## 2014-10-03 LAB — LIPID PANEL
CHOL/HDL RATIO: 4.7 ratio
CHOLESTEROL: 90 mg/dL (ref 0–200)
HDL: 19 mg/dL — ABNORMAL LOW (ref >39)
LDL CALC: 52 mg/dL (ref 0–99)
Triglycerides: 94 mg/dL (ref <150)
VLDL: 19 mg/dL (ref 0–40)

## 2014-10-04 LAB — VITAMIN D 25 HYDROXY (VIT D DEFICIENCY, FRACTURES): Vit D, 25-Hydroxy: 31 ng/mL (ref 30–100)

## 2014-10-08 ENCOUNTER — Other Ambulatory Visit: Payer: Self-pay | Admitting: Family Medicine

## 2014-10-16 ENCOUNTER — Ambulatory Visit: Payer: BC Managed Care – PPO | Admitting: Family Medicine

## 2014-10-20 NOTE — Assessment & Plan Note (Signed)
Weekly vit D prescribed 

## 2014-10-20 NOTE — Assessment & Plan Note (Signed)
Controlled, no change in medication DASH diet and commitment to daily physical activity for a minimum of 30 minutes discussed and encouraged, as a part of hypertension management. The importance of attaining a healthy weight is also discussed.  

## 2014-10-20 NOTE — Assessment & Plan Note (Signed)
z pack prescribed

## 2014-10-20 NOTE — Assessment & Plan Note (Signed)
Controlled, no change in medication Improved following recent surgery

## 2014-10-20 NOTE — Assessment & Plan Note (Signed)
Improved and well controlled, successful surgery has had a positive effect

## 2014-10-20 NOTE — Assessment & Plan Note (Signed)
Improved. Pt applauded on succesful weight loss through lifestyle change, and encouraged to continue same. Weight loss goal set for the next several months.  

## 2014-10-20 NOTE — Assessment & Plan Note (Signed)
Increased symptoms with season change, pt reminded of the need to use preventive meds daily for effect

## 2014-10-29 ENCOUNTER — Ambulatory Visit: Payer: BC Managed Care – PPO | Admitting: Dietician

## 2014-11-05 ENCOUNTER — Telehealth: Payer: Self-pay | Admitting: *Deleted

## 2014-11-05 ENCOUNTER — Other Ambulatory Visit: Payer: Self-pay

## 2014-11-05 MED ORDER — METOPROLOL SUCCINATE ER 50 MG PO TB24: ORAL_TABLET | ORAL | Status: DC

## 2014-11-05 NOTE — Telephone Encounter (Signed)
Spoke with patient and she states that she is having a problem with pharmacy dispensing Metoprolol.  Rx faxed to pharmacy with request to fill today

## 2014-11-05 NOTE — Telephone Encounter (Signed)
Pt called requesting to speak with a nurse. Please advise [phone]

## 2014-11-27 ENCOUNTER — Ambulatory Visit: Payer: BC Managed Care – PPO | Admitting: Dietician

## 2014-12-09 ENCOUNTER — Encounter: Payer: BC Managed Care – PPO | Attending: Surgery | Admitting: Dietician

## 2014-12-09 VITALS — Ht 62.0 in | Wt 271.0 lb

## 2014-12-09 DIAGNOSIS — Z713 Dietary counseling and surveillance: Secondary | ICD-10-CM | POA: Insufficient documentation

## 2014-12-09 DIAGNOSIS — Z6841 Body Mass Index (BMI) 40.0 and over, adult: Secondary | ICD-10-CM | POA: Diagnosis not present

## 2014-12-09 NOTE — Progress Notes (Signed)
  Follow-up visit:  3 months Post-Operative RYGB Surgery  Medical Nutrition Therapy:  Appt start time: 330 end time:  400  Primary concerns today: Post-operative Bariatric Surgery Nutrition Management.  Holly Alvarado returns for her first visit at [facility] since post op class. She has been meeting her protein needs most days but sometimes skips meals. She has also had some carbohydrates like bread and potatoes.   Surgery date: 09/02/2014 Surgery type: RYGB Start weight at [facility]: 313 lbs on 05/30/14 (320 lbs per patient) Weight today: 271 lbs Weight change: 27 lbs Total weight lost: 49 lbs  TANITA  BODY COMP RESULTS  08/12/14 09/17/14 12/09/14   BMI (kg/m^2) 57.7 54.5 49.6   Fat Mass (lbs) 181 175.0 141.5   Fat Free Mass (lbs) 134.5 123.0 129.5   Total Body Water (lbs) 98.5 90.0 95    Preferred Learning Style:   No preference indicated   Learning Readiness:  Ready  24-hr recall: B (AM): 1/2-3/4 Premier protein shake (15-20g) Snk (AM): rest of protein shake (10-15g)  L (PM): 1/2 [place] and cheese sandwich and 1/2 [place] yogurt (~24g) Snk (PM): rest of yogurt (6g)  D (PM): Smart Ones, [employer], or Healthy Choice frozen dinner (10g) Snk (PM):   Fluid intake: diet lemonade, protein shake, water, skim milk (unable to determine amount; patient reports insufficient) Estimated total protein intake: ~60 most days  Medications: see list Supplementation: sometimes forgets  Using straws: occasionally, not having pain and helps her drink more water Drinking while eating: occasionally sips Hair loss: none Carbonated beverages: none N/V/D/C: none Dumping syndrome: none  Recent physical activity:  Walking daily and 29mn low impact exercise video  Progress Towards Goal(s):  In progress.  Handouts given during visit include:  Phase 3B lean protein + non starchy vegetables  Snack list  Samples Provided and patient instructed on proper use: PB2 (qty 2) Lot#: [id number]:  04/2015  Unjury protein powder (unflavored - qty 2) Lot#: [id number]: 10/2015   Nutritional Diagnosis:  -3.3 Overweight/obesity related to past poor dietary habits and physical inactivity as evidenced by patient w/ recent RYGB surgery following dietary guidelines for continued weight loss.     Intervention:  Nutrition counseling provided.  Teaching Method Utilized:  Visual Auditory  Barriers to learning/adherence to lifestyle change: family stress  Demonstrated degree of understanding via:  Teach Back   Monitoring/Evaluation:  Dietary intake, exercise, and body weight. Follow up in 2 months for 5 month post-op visit.

## 2014-12-09 NOTE — Patient Instructions (Addendum)
Goals:  Follow Phase 3B: High Protein + Non-Starchy Vegetables  Eat 3-6 small meals/snacks, every 3-5 hrs  Keep healthy snacks on hand!  Increase lean protein foods to meet 60g goal  Increase fluid intake to 64oz +  Avoid drinking 15 minutes before, during and 30 minutes after eating  Choose another "complete" multivitamin    TANITA  BODY COMP RESULTS  08/12/14 09/17/14 12/09/14   BMI (kg/m^2) 57.7 54.5 49.6   Fat Mass (lbs) 181 175.0 141.5   Fat Free Mass (lbs) 134.5 123.0 129.5   Total Body Water (lbs) 98.5 90.0 95

## 2014-12-17 ENCOUNTER — Other Ambulatory Visit: Payer: Self-pay

## 2014-12-17 DIAGNOSIS — Z1231 Encounter for screening mammogram for malignant neoplasm of breast: Secondary | ICD-10-CM

## 2014-12-20 ENCOUNTER — Ambulatory Visit
Admission: RE | Admit: 2014-12-20 | Discharge: 2014-12-20 | Disposition: A | Discharge status: Home / Self Care | Payer: BC Managed Care – PPO | Source: Ambulatory Visit

## 2014-12-20 DIAGNOSIS — Z1231 Encounter for screening mammogram for malignant neoplasm of breast: Secondary | ICD-10-CM

## 2014-12-25 ENCOUNTER — Telehealth: Payer: Self-pay

## 2014-12-25 NOTE — Telephone Encounter (Signed)
pls send in nystatin powder twice daily to affected area

## 2014-12-25 NOTE — Telephone Encounter (Signed)
States she has developed yeast under her abdominal folds and wants something for it called it. Recently had her bypass surgery and is losing weight. Uses [facility]

## 2014-12-26 ENCOUNTER — Other Ambulatory Visit: Payer: Self-pay

## 2014-12-26 MED ORDER — NYSTATIN 100000 UNIT/GM EX POWD: CUTANEOUS | Status: DC

## 2014-12-26 NOTE — Telephone Encounter (Signed)
msg left for patient

## 2015-02-05 ENCOUNTER — Ambulatory Visit: Payer: BC Managed Care – PPO | Admitting: Family Medicine

## 2015-02-10 ENCOUNTER — Ambulatory Visit: Payer: BC Managed Care – PPO | Admitting: Dietician

## 2015-02-13 ENCOUNTER — Other Ambulatory Visit: Payer: Self-pay | Admitting: Family Medicine

## 2015-04-24 ENCOUNTER — Other Ambulatory Visit: Payer: Self-pay | Admitting: Family Medicine

## 2015-04-24 ENCOUNTER — Ambulatory Visit (INDEPENDENT_AMBULATORY_CARE_PROVIDER_SITE_OTHER): Payer: BC Managed Care – PPO | Admitting: Family Medicine

## 2015-04-24 ENCOUNTER — Encounter: Payer: Self-pay | Admitting: Family Medicine

## 2015-04-24 VITALS — BP 118/80 | HR 90 | Resp 16 | Ht 62.0 in | Wt 242.0 lb

## 2015-04-24 DIAGNOSIS — I10 Essential (primary) hypertension: Secondary | ICD-10-CM | POA: Diagnosis not present

## 2015-04-24 DIAGNOSIS — F32A Depression, unspecified: Secondary | ICD-10-CM

## 2015-04-24 DIAGNOSIS — Z114 Encounter for screening for human immunodeficiency virus [HIV]: Secondary | ICD-10-CM

## 2015-04-24 DIAGNOSIS — R7309 Other abnormal glucose: Secondary | ICD-10-CM

## 2015-04-24 DIAGNOSIS — R7989 Other specified abnormal findings of blood chemistry: Secondary | ICD-10-CM

## 2015-04-24 DIAGNOSIS — R7303 Prediabetes: Secondary | ICD-10-CM

## 2015-04-24 DIAGNOSIS — K2100 Gastro-esophageal reflux disease with esophagitis, without bleeding: Secondary | ICD-10-CM

## 2015-04-24 DIAGNOSIS — Z6841 Body Mass Index (BMI) 40.0 and over, adult: Secondary | ICD-10-CM

## 2015-04-24 DIAGNOSIS — G4700 Insomnia, unspecified: Secondary | ICD-10-CM

## 2015-04-24 DIAGNOSIS — D72820 Lymphocytosis (symptomatic): Secondary | ICD-10-CM

## 2015-04-24 DIAGNOSIS — D563 Thalassemia minor: Secondary | ICD-10-CM

## 2015-04-24 DIAGNOSIS — F411 Generalized anxiety disorder: Secondary | ICD-10-CM

## 2015-04-24 DIAGNOSIS — E8881 Metabolic syndrome: Secondary | ICD-10-CM

## 2015-04-24 DIAGNOSIS — E559 Vitamin D deficiency, unspecified: Secondary | ICD-10-CM

## 2015-04-24 DIAGNOSIS — R945 Abnormal results of liver function studies: Secondary | ICD-10-CM

## 2015-04-24 DIAGNOSIS — J301 Allergic rhinitis due to pollen: Secondary | ICD-10-CM

## 2015-04-24 DIAGNOSIS — F329 Major depressive disorder, single episode, unspecified: Secondary | ICD-10-CM

## 2015-04-24 MED ORDER — ALPRAZOLAM 0.25 MG PO TABS: 0.2500 mg | ORAL_TABLET | Freq: Every evening | ORAL | Status: DC | PRN

## 2015-04-24 MED ORDER — TEMAZEPAM 15 MG PO CAPS
15.0000 mg | ORAL_CAPSULE | Freq: Every evening | ORAL | Status: DC | PRN
Start: 2015-04-24 — End: 2015-12-23

## 2015-04-24 MED ORDER — VENLAFAXINE HCL 37.5 MG PO TABS: 37.5000 mg | ORAL_TABLET | Freq: Two times a day (BID) | ORAL | Status: DC

## 2015-04-24 MED ORDER — METOPROLOL SUCCINATE ER 50 MG PO TB24: ORAL_TABLET | ORAL | Status: DC

## 2015-04-24 NOTE — Patient Instructions (Addendum)
F/u in 4 month, calll if you need me before  Start 30 mins every day dedicated to healthy exercise routine for YOU  Labs today  Meds as before  CONGRATS!!!  Weight loss goal of 16 pounds  New for sleep is restoril, practice good sleep hygiene  Return to nutritionist  Thanks for choosing [facility], we consider it a privelige to serve you.

## 2015-04-24 NOTE — Progress Notes (Signed)
Subjective:    Patient ID: Francina Ames, female    DOB: [birth year], 49 y.o.   MRN: [id number]  HPI   SHAWNTINA DIFFEE     MRN: [id number]      DOB: 23-Apr-1966   HPI Ms. Germer is here for follow up and re-evaluation of chronic medical conditions, medication management and review of any available recent lab and radiology data.  Preventive health is updated, specifically  Cancer screening and Immunization.   Excellent ongoing weight loss since her bariaitric surgery with sense of wellbeing. Needs updated labs The PT denies any adverse reactions to current medications since the last visit.  There are no new concerns.  There are no specific complaints   ROS Denies recent fever or chills. Denies sinus pressure, nasal congestion, ear pain or sore throat. Denies chest congestion, productive cough or wheezing. Denies chest pains, palpitations and leg swelling Denies abdominal pain, nausea, vomiting,diarrhea or constipation.   Denies dysuria, frequency, hesitancy or incontinence. Denies joint pain, swelling and limitation in mobility. Denies headaches, seizures, numbness, or tingling. Denies depression, anxiety or insomnia. Denies skin break down or rash.   PE  BP 118/80 mmHg  Pulse 90  Resp 16  Ht 5' 2"  (1.575 m)  Wt 242 lb (109.77 kg)  BMI 44.25 kg/m2  SpO2 97%  Patient alert and oriented and in no cardiopulmonary distress.  HEENT: No facial asymmetry, EOMI,   oropharynx pink and moist.  Neck supple no JVD, no mass.  Chest: Clear to auscultation bilaterally.  CVS: S1, S2 no murmurs, no S3.Regular rate.  ABD: Soft non tender.   Ext: No edema  MS: Adequate ROM spine, shoulders, hips and knees.  Skin: Intact, no ulcerations or rash noted.  Psych: Good eye contact, normal affect. Memory intact not anxious or depressed appearing.  CNS: CN 2-12 intact, power,  normal throughout.no focal deficits noted.   Assessment & Plan  Essential hypertension Controlled, no  change in medication DASH diet and commitment to daily physical activity for a minimum of 30 minutes discussed and encouraged, as a part of hypertension management. The importance of attaining a healthy weight is also discussed.  BP/Weight 04/24/2015 12/09/2014 09/30/2014 09/17/2014 09/06/2014 09/02/2014 06/01/7095  Systolic BP 283 - 662 - 947 - 654  Diastolic BP 80 - 80 - 73 - 77  Wt. (Lbs) 242 271 290 298 320.11 - 309  BMI 44.25 49.55 53.03 54.49 - 58.53 56.5        ALLERGIC RHINITIS, SEASONAL Controlled, no change in medication   Prediabetes Patient educated about the importance of limiting  Carbohydrate intake , the need to commit to daily physical activity for a minimum of 30 minutes , and to commit weight loss. The fact that changes in all these areas will reduce or eliminate all together the development of diabetes is stressed.  Marked improvement with weight loss, now normalized  Diabetic Labs Latest Ref Rng 04/24/2015 10/03/2014 09/02/2014 08/28/2014 07/04/2014  HbA1c <5.7 % 5.6 - - - -  Chol 125 - 200 mg/dL 106(L) 90 - - -  HDL >=46 mg/dL 31(L) 19(L) - - -  Calc LDL <130 mg/dL 58 52 - - -  Triglycerides <150 mg/dL 84 94 - - -  Creatinine 0.50 - 1.10 mg/dL 0.79 0.65 0.70 0.62 0.69   BP/Weight 04/24/2015 12/09/2014 09/30/2014 09/17/2014 09/06/2014 09/02/2014 65/0/3546  Systolic BP 568 - 127 - 517 - 001  Diastolic BP 80 - 80 - 73 - 77  Wt. (Lbs) 242  271 290 298 320.11 - 309  BMI 44.25 49.55 53.03 54.49 - 58.53 56.5   No flowsheet data found.     GERD Controlled, no change in medication Improved symptoms since weight loss  Insomnia Sleep hygiene reviewed and written information offered also. Prescription sent for  medication needed.   Morbid obesity with BMI of 40.0-44.9, adult Improved Patient re-educated about  the importance of commitment to a  minimum of 150 minutes of exercise per week.  The importance of healthy food choices with portion control  discussed. Encouraged to start a food diary, count calories and to consider  joining a support group. Sample diet sheets offered. Goals set by the patient for the next several months.   Weight /BMI 04/24/2015 12/09/2014 09/30/2014  WEIGHT 242 lb 271 lb 290 lb  HEIGHT 5' 2"  5' 2"  5' 2"   BMI 44.25 kg/m2 49.55 kg/m2 53.03 kg/m2    Current exercise per week 60 minutes.Needs to start daily exercise   GENERALIZED ANXIETY DISORDER Controlled, no change in medication   Depression Controlled, no change in medication Improved as her level of function is improving with weight loss  Vitamin D deficiency Needs weekly vit D  Elevated LFTs Updated lab shows worsened LFT, refer for [place], check hepatitis status and refer to GI  Metabolic syndrome X The increased risk of cardiovascular disease associated with this diagnosis, and the need to consistently work on lifestyle to change this is discussed. Following  a  heart healthy diet ,commitment to 30 minutes of exercise at least 5 days per week, as well as control of blood sugar and cholesterol , and achieving a healthy weight are all the areas to be addressed .   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

## 2015-04-25 ENCOUNTER — Other Ambulatory Visit: Payer: Self-pay | Admitting: Family Medicine

## 2015-04-25 ENCOUNTER — Encounter: Payer: Self-pay | Admitting: Family Medicine

## 2015-04-25 DIAGNOSIS — R945 Abnormal results of liver function studies: Principal | ICD-10-CM

## 2015-04-25 DIAGNOSIS — R7989 Other specified abnormal findings of blood chemistry: Secondary | ICD-10-CM

## 2015-04-25 LAB — TSH: TSH: 0.454 u[IU]/mL (ref 0.350–4.500)

## 2015-04-25 LAB — BASIC METABOLIC PANEL
BUN: 10 mg/dL (ref 7–25)
CALCIUM: 9.4 mg/dL (ref 8.6–10.2)
CO2: 24 mEq/L (ref 20–31)
Chloride: 106 mEq/L (ref 98–110)
Creat: 0.79 mg/dL (ref 0.50–1.10)
GLUCOSE: 81 mg/dL (ref 65–99)
POTASSIUM: 4.6 meq/L (ref 3.5–5.3)
Sodium: 139 mEq/L (ref 135–146)

## 2015-04-25 LAB — HIV ANTIBODY (ROUTINE TESTING W REFLEX): HIV: NONREACTIVE

## 2015-04-25 LAB — CBC
HCT: 40.4 % (ref 36.0–46.0)
Hemoglobin: 13.2 g/dL (ref 12.0–15.0)
MCH: 23.7 pg — ABNORMAL LOW (ref 26.0–34.0)
MCHC: 32.7 g/dL (ref 30.0–36.0)
MCV: 72.4 fL — ABNORMAL LOW (ref 78.0–100.0)
MPV: 10.6 fL (ref 8.6–12.4)
Platelets: 229 10*3/uL (ref 150–400)
RBC: 5.58 MIL/uL — ABNORMAL HIGH (ref 3.87–5.11)
RDW: 16.1 % — ABNORMAL HIGH (ref 11.5–15.5)
WBC: 6.9 10*3/uL (ref 4.0–10.5)

## 2015-04-25 LAB — HEPATIC FUNCTION PANEL
ALBUMIN: 3.7 g/dL (ref 3.6–5.1)
ALK PHOS: 162 U/L — AB (ref 33–115)
ALT: 205 U/L — ABNORMAL HIGH (ref 6–29)
AST: 243 U/L — AB (ref 10–35)
BILIRUBIN DIRECT: 0.4 mg/dL — AB (ref <=0.2)
Indirect Bilirubin: 0.5 mg/dL (ref 0.2–1.2)
Total Bilirubin: 0.9 mg/dL (ref 0.2–1.2)
Total Protein: 8.6 g/dL — ABNORMAL HIGH (ref 6.1–8.1)

## 2015-04-25 LAB — HEMOGLOBIN A1C
Hgb A1c MFr Bld: 5.6 % (ref <5.7)
MEAN PLASMA GLUCOSE: 114 mg/dL (ref <117)

## 2015-04-25 LAB — VITAMIN D 25 HYDROXY (VIT D DEFICIENCY, FRACTURES): Vit D, 25-Hydroxy: 26 ng/mL — ABNORMAL LOW (ref 30–100)

## 2015-04-25 LAB — LIPID PANEL
CHOLESTEROL: 106 mg/dL — AB (ref 125–200)
HDL: 31 mg/dL — ABNORMAL LOW (ref >=46)
LDL CALC: 58 mg/dL (ref <130)
Total CHOL/HDL Ratio: 3.4 Ratio (ref <=5.0)
Triglycerides: 84 mg/dL (ref <150)
VLDL: 17 mg/dL (ref <30)

## 2015-04-25 LAB — FOLATE

## 2015-04-25 LAB — VITAMIN B12: Vitamin B-12: 591 pg/mL (ref 211–911)

## 2015-04-25 LAB — FERRITIN: Ferritin: 409 ng/mL — ABNORMAL HIGH (ref 10–291)

## 2015-04-25 MED ORDER — ERGOCALCIFEROL 1.25 MG (50000 UT) PO CAPS: 50000.0000 [IU] | ORAL_CAPSULE | ORAL | Status: DC

## 2015-04-26 ENCOUNTER — Telehealth: Payer: Self-pay | Admitting: Family Medicine

## 2015-04-26 NOTE — Assessment & Plan Note (Signed)
Needs weekly vit D

## 2015-04-26 NOTE — Assessment & Plan Note (Signed)
The increased risk of cardiovascular disease associated with this diagnosis, and the need to consistently work on lifestyle to change this is discussed. Following  a  heart healthy diet ,commitment to 30 minutes of exercise at least 5 days per week, as well as control of blood sugar and cholesterol , and achieving a healthy weight are all the areas to be addressed .  

## 2015-04-26 NOTE — Assessment & Plan Note (Addendum)
Improved Patient re-educated about  the importance of commitment to a  minimum of 150 minutes of exercise per week.  The importance of healthy food choices with portion control discussed. Encouraged to start a food diary, count calories and to consider  joining a support group. Sample diet sheets offered. Goals set by the patient for the next several months.   Weight /BMI 04/24/2015 12/09/2014 09/30/2014  WEIGHT 242 lb 271 lb 290 lb  HEIGHT 5' 2"  5' 2"  5' 2"   BMI 44.25 kg/m2 49.55 kg/m2 53.03 kg/m2    Current exercise per week 60 minutes.Needs to start daily exercise

## 2015-04-26 NOTE — Assessment & Plan Note (Signed)
Sleep hygiene reviewed and written information offered also. Prescription sent for  medication needed.  

## 2015-04-26 NOTE — Assessment & Plan Note (Signed)
Controlled, no change in medication Improved symptoms since weight loss

## 2015-04-26 NOTE — Assessment & Plan Note (Addendum)
Patient educated about the importance of limiting  Carbohydrate intake , the need to commit to daily physical activity for a minimum of 30 minutes , and to commit weight loss. The fact that changes in all these areas will reduce or eliminate all together the development of diabetes is stressed.  Marked improvement with weight loss, now normalized  Diabetic Labs Latest Ref Rng 04/24/2015 10/03/2014 09/02/2014 08/28/2014 07/04/2014  HbA1c <5.7 % 5.6 - - - -  Chol 125 - 200 mg/dL 106(L) 90 - - -  HDL >=46 mg/dL 31(L) 19(L) - - -  Calc LDL <130 mg/dL 58 52 - - -  Triglycerides <150 mg/dL 84 94 - - -  Creatinine 0.50 - 1.10 mg/dL 0.79 0.65 0.70 0.62 0.69   BP/Weight 04/24/2015 12/09/2014 09/30/2014 09/17/2014 09/06/2014 09/02/2014 11/29/5972  Systolic BP 163 - 845 - 364 - 680  Diastolic BP 80 - 80 - 73 - 77  Wt. (Lbs) 242 271 290 298 320.11 - 309  BMI 44.25 49.55 53.03 54.49 - 58.53 56.5   No flowsheet data found.

## 2015-04-26 NOTE — Assessment & Plan Note (Signed)
Controlled, no change in medication  

## 2015-04-26 NOTE — Assessment & Plan Note (Signed)
Need to add differential to current lab

## 2015-04-26 NOTE — Telephone Encounter (Signed)
I had requested hepatitis panel add on, do not see that this was done, also new , if possible , please add a differential to her CBC, dx is lymphocytosis, if unable to add do not restick for the differential only thanks

## 2015-04-26 NOTE — Assessment & Plan Note (Signed)
Controlled, no change in medication DASH diet and commitment to daily physical activity for a minimum of 30 minutes discussed and encouraged, as a part of hypertension management. The importance of attaining a healthy weight is also discussed.  BP/Weight 04/24/2015 12/09/2014 09/30/2014 09/17/2014 09/06/2014 09/02/2014 68/11/8704  Systolic BP 582 - 608 - 883 - 584  Diastolic BP 80 - 80 - 73 - 77  Wt. (Lbs) 242 271 290 298 320.11 - 309  BMI 44.25 49.55 53.03 54.49 - 58.53 56.5

## 2015-04-26 NOTE — Assessment & Plan Note (Signed)
Updated lab shows worsened LFT, refer for [place], check hepatitis status and refer to GI

## 2015-04-26 NOTE — Assessment & Plan Note (Signed)
Controlled, no change in medication Improved as her level of function is improving with weight loss

## 2015-04-28 LAB — HEPATITIS PANEL, ACUTE
HCV Ab: NEGATIVE
HEP B C IGM: NONREACTIVE
Hep A IgM: NONREACTIVE
Hepatitis B Surface Ag: NEGATIVE

## 2015-04-28 NOTE — Telephone Encounter (Signed)
Lab added

## 2015-04-30 ENCOUNTER — Encounter (INDEPENDENT_AMBULATORY_CARE_PROVIDER_SITE_OTHER): Payer: Self-pay | Admitting: *Deleted

## 2015-05-01 ENCOUNTER — Other Ambulatory Visit: Payer: Self-pay | Admitting: Family Medicine

## 2015-05-07 ENCOUNTER — Ambulatory Visit (HOSPITAL_COMMUNITY): Payer: BC Managed Care – PPO

## 2015-05-13 ENCOUNTER — Encounter: Payer: Self-pay | Admitting: Dietician

## 2015-05-13 ENCOUNTER — Encounter: Payer: BC Managed Care – PPO | Attending: Surgery | Admitting: Dietician

## 2015-05-13 VITALS — Ht 62.0 in | Wt 243.5 lb

## 2015-05-13 DIAGNOSIS — Z6841 Body Mass Index (BMI) 40.0 and over, adult: Secondary | ICD-10-CM | POA: Insufficient documentation

## 2015-05-13 DIAGNOSIS — Z713 Dietary counseling and surveillance: Secondary | ICD-10-CM | POA: Diagnosis not present

## 2015-05-13 NOTE — Patient Instructions (Addendum)
Goals:  Follow Phase 3B: High Protein + Non-Starchy Vegetables  Get back into eating 3-6 small meals/snacks, every 3-5 hrs  Keep healthy snacks on hand!  Increase lean protein foods to meet 60g goal  Increase fluid intake to 64oz +  Avoid drinking 15 minutes before, during and 30 minutes after eating  Get back into routine!  Drink Premier protein shake instead of Glucerna  Try a yogurt with less than 12 grams of carbs  Surgery type: RYGB Start weight at [facility]: 313 lbs on 05/30/14 (320 lbs per patient) Weight today: 243.5 lbs Weight change: 27.5 lbs Total weight lost: 76.5 lbs  TANITA  BODY COMP RESULTS  08/12/14 09/17/14 12/09/14 05/13/15   BMI (kg/m^2) 57.7 54.5 49.6 44.5   Fat Mass (lbs) 181 175.0 141.5 126.5   Fat Free Mass (lbs) 134.5 123.0 129.5 117   Total Body Water (lbs) 98.5 90.0 95 85.5

## 2015-05-13 NOTE — Progress Notes (Signed)
  Follow-up visit:  8 months Post-Operative RYGB Surgery  Medical Nutrition Therapy:  Appt start time: 8569 end time:  1220  Primary concerns today: Post-operative Bariatric Surgery Nutrition Management.  Shakeia returns having lost another 27 lbs. She is surprised because her "diet has been terrible." Her kitchen is being remodeled so she has been eating out a lot. Will hopefully be done by the end of the month. Unable to tolerate red meat and still has difficulty with dry meat.   Surgery date: 09/02/2014 Surgery type: RYGB Start weight at [facility]: 313 lbs on 05/30/14 (320 lbs per patient) Weight today: 243.5 lbs Weight change: 27.5 lbs Total weight lost: 76.5 lbs  Goal: about 210 lbs by the end of the year  TANITA  BODY COMP RESULTS  08/12/14 09/17/14 12/09/14 05/13/15   BMI (kg/m^2) 57.7 54.5 49.6 44.5   Fat Mass (lbs) 181 175.0 141.5 126.5   Fat Free Mass (lbs) 134.5 123.0 129.5 117   Total Body Water (lbs) 98.5 90.0 95 85.5    Preferred Learning Style:   No preference indicated   Learning Readiness:  Ready  24-hr recall: B (AM): Glucerna shake (16g) Snk (AM):  L (PM): 3 oz meat with gravy, vegetable (21g) Snk (PM):  D (PM): Lean cuisine lasagna or chicken parmesan (10g) Snk (PM):   Fluid intake: diet lemonade, protein shake, water, skim milk (unable to determine amount; patient reports insufficient) Estimated total protein intake: unable to determine  Medications: see list Supplementation: taking  Using straws: occasionally, not having pain and helps her drink more water Drinking while eating: occasionally sips Hair loss: yes Carbonated beverages: sips, did not like it N/V/D/C: some constipation, resolved with Miralax Dumping syndrome: yes with cereal  Recent physical activity:  Walking daily and 27mn low impact exercise video  Progress Towards Goal(s):  In progress.   Nutritional Diagnosis:  [place]-3.3 Overweight/obesity related to past poor dietary habits and  physical inactivity as evidenced by patient w/ recent RYGB surgery following dietary guidelines for continued weight loss.     Intervention:  Nutrition counseling provided.  Teaching Method Utilized:  Visual Auditory  Barriers to learning/adherence to lifestyle change: family stress  Demonstrated degree of understanding via:  Teach Back   Monitoring/Evaluation:  Dietary intake, exercise, and body weight. Follow up in 4 months for 12 month post-op visit.

## 2015-05-14 ENCOUNTER — Other Ambulatory Visit: Payer: Self-pay | Admitting: Family Medicine

## 2015-05-14 ENCOUNTER — Ambulatory Visit (HOSPITAL_COMMUNITY)
Admission: RE | Admit: 2015-05-14 | Discharge: 2015-05-14 | Disposition: A | Discharge status: Home / Self Care | Payer: BC Managed Care – PPO | Source: Ambulatory Visit | Attending: Family Medicine | Admitting: Family Medicine

## 2015-05-14 ENCOUNTER — Telehealth: Payer: Self-pay

## 2015-05-14 DIAGNOSIS — K838 Other specified diseases of biliary tract: Secondary | ICD-10-CM | POA: Diagnosis not present

## 2015-05-14 DIAGNOSIS — R945 Abnormal results of liver function studies: Secondary | ICD-10-CM

## 2015-05-14 DIAGNOSIS — Z9049 Acquired absence of other specified parts of digestive tract: Secondary | ICD-10-CM | POA: Diagnosis not present

## 2015-05-14 DIAGNOSIS — Z9884 Bariatric surgery status: Secondary | ICD-10-CM | POA: Diagnosis not present

## 2015-05-14 DIAGNOSIS — R7989 Other specified abnormal findings of blood chemistry: Secondary | ICD-10-CM

## 2015-05-14 DIAGNOSIS — R1011 Right upper quadrant pain: Secondary | ICD-10-CM | POA: Diagnosis present

## 2015-05-14 NOTE — Telephone Encounter (Signed)
-----   Message from [name], MD sent at 05/14/2015 10:59 AM EDT ----- pls let her know liver looks normal on [place] per report Bile duct  MAY be dilaTED, if FURTHER TESTING IS NEEDED, THEN THIS WILL BE ORDERED,  She has been referred  To Dr Laural Golden alreay

## 2015-05-14 NOTE — Telephone Encounter (Signed)
MR ordered per recommendation from Dr. Laural Golden.  Will try again to get in contact with patient

## 2015-05-15 ENCOUNTER — Encounter: Payer: Self-pay | Admitting: Family Medicine

## 2015-05-26 ENCOUNTER — Telehealth: Payer: Self-pay | Admitting: *Deleted

## 2015-05-26 ENCOUNTER — Ambulatory Visit (HOSPITAL_COMMUNITY): Payer: BC Managed Care – PPO

## 2015-05-26 NOTE — Telephone Encounter (Signed)
Christie from Dr. Kellie Moor office called stating pt was a no show.

## 2015-06-03 ENCOUNTER — Other Ambulatory Visit: Payer: Self-pay | Admitting: Family Medicine

## 2015-06-03 ENCOUNTER — Ambulatory Visit (HOSPITAL_COMMUNITY)
Admission: RE | Admit: 2015-06-03 | Discharge: 2015-06-03 | Disposition: A | Discharge status: Home / Self Care | Payer: BC Managed Care – PPO | Source: Ambulatory Visit | Attending: Family Medicine | Admitting: Family Medicine

## 2015-06-03 DIAGNOSIS — R1011 Right upper quadrant pain: Secondary | ICD-10-CM | POA: Diagnosis not present

## 2015-06-03 DIAGNOSIS — R932 Abnormal findings on diagnostic imaging of liver and biliary tract: Secondary | ICD-10-CM | POA: Insufficient documentation

## 2015-06-03 DIAGNOSIS — R7989 Other specified abnormal findings of blood chemistry: Secondary | ICD-10-CM | POA: Insufficient documentation

## 2015-06-03 DIAGNOSIS — R945 Abnormal results of liver function studies: Secondary | ICD-10-CM

## 2015-06-03 DIAGNOSIS — K838 Other specified diseases of biliary tract: Secondary | ICD-10-CM

## 2015-06-03 DIAGNOSIS — Z9049 Acquired absence of other specified parts of digestive tract: Secondary | ICD-10-CM | POA: Diagnosis not present

## 2015-06-03 MED ORDER — GADOBENATE DIMEGLUMINE 529 MG/ML IV SOLN
20.0000 mL | Freq: Once | INTRAVENOUS | Status: AC | PRN
  Administered 2015-06-03: 20 mL via INTRAVENOUS

## 2015-06-05 ENCOUNTER — Ambulatory Visit (INDEPENDENT_AMBULATORY_CARE_PROVIDER_SITE_OTHER): Payer: BC Managed Care – PPO | Admitting: Internal Medicine

## 2015-06-06 ENCOUNTER — Encounter (HOSPITAL_COMMUNITY): Payer: BC Managed Care – PPO | Attending: Oncology | Admitting: Oncology

## 2015-06-06 VITALS — BP 152/82 | HR 64 | Temp 98.0°F | Resp 18 | Wt 243.1 lb

## 2015-06-06 DIAGNOSIS — D563 Thalassemia minor: Secondary | ICD-10-CM | POA: Diagnosis not present

## 2015-06-06 NOTE — Progress Notes (Signed)
Holly Nakayama, MD [address], [address] [place] [address]  Thalassemia trait, beta  CURRENT THERAPY: Surveillance.  INTERVAL HISTORY: Holly Alvarado 49 y.o. 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  I personally reviewed and went over laboratory results with the patient.  The results are noted within this dictation.  She has beta thalasemmia trait and therefore, she will be chronically microcytic.  Her Hgb is WNL.  Her WBC is WNL and therefore, further evaluation is unnecessary.  Chart is reviewed.  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  She notes that she is down 80 lbs.  Her Vit D level is low and this has been addressed by her primary care provider.  She denies any B symptoms.  Past Medical History  Diagnosis Date  . Allergic rhinitis, seasonal   . Anxiety disorder   . GERD (gastroesophageal reflux disease)   . Obesity   . Hypertension   . Chronic constipation   . Hemorrhoids   . Esophageal polyp   . Fatty liver   . Complication of anesthesia     woke up during surgery of tubal ligation   . Diabetes mellitus without complication     prediabetic not on meds   . Anxiety     hx of panic attacks and panic attacks with surgery   . 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     is allergic to codeine and sulfonamide derivatives.  Current Outpatient Prescriptions on File  Prior to Visit  Medication Sig Dispense Refill  . ALPRAZolam (XANAX) 0.25 MG tablet Take 1 tablet (0.25 mg total) by mouth at bedtime as needed. 30 tablet 3  . Calcium Carb-Cholecalciferol (CALCIUM 600 + D) 600-200 MG-UNIT TABS Take 1 capsule by mouth daily.    . Clobetasol Propionate 0.05 % shampoo Use once weekly to  shampoo affected areas of scalp for 4 weeks, then as needed 118 mL 1  . clotrimazole-betamethasone (LOTRISONE) cream Apply twice daily to affected area (s) of scalp with rash for 10 days, then  as needed (Patient taking differently: Apply 1 application topically 2 (two) times daily as needed (rash on scalp.). ) 45 g 1  . ergocalciferol (VITAMIN D2) 50000 UNITS capsule Take 1 capsule (50,000 Units total) by mouth once a week. One capsule once weekly 12 capsule 3  . esomeprazole (NEXIUM) 20 MG packet Take 20 mg by mouth daily before breakfast.    . esomeprazole (NEXIUM) 40 MG capsule TAKE 1 CAPSULE BY MOUTH DAILY 30 capsule 3  . fluticasone (FLONASE) 50 MCG/ACT nasal spray Place 2 sprays into both nostrils daily as needed for allergies or rhinitis.    Marland Kitchen loratadine (CLARITIN) 10 MG tablet Take 10 mg by mouth daily as needed for allergies.     . metoprolol succinate (TOPROL-XL) 50 MG 24 hr tablet TAKE 1 TABLET BY MOUTH TWICE A DAY WITH OR IMMEDIATELY FOLLOWING A MEAL 60 tablet 4  . Multiple Vitamin (MULTIVITAMIN)  capsule Take 1 capsule by mouth daily.    . temazepam (RESTORIL) 15 MG capsule Take 1 capsule (15 mg total) by mouth at bedtime as needed for sleep. 30 capsule 4  . venlafaxine (EFFEXOR) 37.5 MG tablet Take 1 tablet (37.5 mg total) by mouth 2 (two) times daily with a meal. 60 tablet 5   No current facility-administered medications on file prior to visit.    Past Surgical History  Procedure Laterality Date  . Ectopic pregnancy surgery  1994, 1995  . Blt  1987  . Cholecystectomy  1997  . Total abdominal hysterectomy  03/11/08    for fibroids   . Mouth surgery      wisdom  tooth extraction  . Partial hysterectomy    . Colonoscopy  12/30/2010  . Upper gastrointestinal endoscopy  12/30/2010  . Upper gastrointestinal endoscopy  05/11/04  . Upper gastrointestinal endoscopy  04/30/97    ANWAR  . Breast biopsy Right 2010    benign  . Breath tek h pylori N/A 02/25/2014    Procedure: BREATH TEK H PYLORI;  Surgeon: Pedro Earls, MD;  Location: [facility];  Service: General;  Laterality: N/A;  . Tubal ligation    . Gastric roux-en-y N/A 09/02/2014    Procedure: LAPAROSCOPIC ROUX-EN-Y GASTRIC BYPASS WITH UPPER ENDOSCOPY;  Surgeon: Pedro Earls, MD;  Location: [facility];  Service: General;  Laterality: N/A;    Denies any headaches, dizziness, double vision, fevers, chills, night sweats, nausea, vomiting, diarrhea, constipation, chest pain, heart palpitations, shortness of breath, blood in stool, black tarry stool, urinary pain, urinary burning, urinary frequency, hematuria.   PHYSICAL EXAMINATION  ECOG PERFORMANCE STATUS: 0 - Asymptomatic  Filed Vitals:   06/06/15 1307  BP: 152/82  Pulse: 64  Temp: 98 F (36.7 C)  Resp: 18    GENERAL:alert, no distress, well nourished, well developed, comfortable, cooperative, obese, smiling and unaccompanied SKIN: skin color, texture, turgor are normal, no rashes or significant lesions HEAD: Normocephalic, No masses, lesions, tenderness or abnormalities EYES: normal, PERRLA, EOMI, Conjunctiva are pink and non-injected EARS: External ears normal OROPHARYNX:lips, buccal mucosa, and tongue normal and mucous membranes are moist  NECK: supple, no adenopathy, thyroid normal size, non-tender, without nodularity, no stridor, non-tender, trachea midline LYMPH:  no palpable lymphadenopathy, no hepatosplenomegaly BREAST:not examined LUNGS: clear to auscultation and percussion HEART: regular rate & rhythm, no murmurs, no gallops, S1 normal and S2 normal ABDOMEN:abdomen soft, non-tender, obese and normal bowel sounds BACK: Back  symmetric, no curvature., No CVA tenderness EXTREMITIES:less then 2 second capillary refill, no joint deformities, effusion, or inflammation, no edema, no skin discoloration, no clubbing, no cyanosis  NEURO: alert & oriented x 3 with fluent speech, no focal motor/sensory deficits, gait normal   LABORATORY DATA: CBC    Component Value Date/Time   WBC 6.9 04/24/2015 1653   RBC 5.58* 04/24/2015 1653   RBC 5.79* 09/10/2013 1445   HGB 13.2 04/24/2015 1653   HCT 40.4 04/24/2015 1653   PLT 229 04/24/2015 1653   MCV 72.4* 04/24/2015 1653   MCH 23.7* 04/24/2015 1653   MCHC 32.7 04/24/2015 1653   RDW 16.1* 04/24/2015 1653   LYMPHSABS 5.1* 09/06/2014 0555   MONOABS 0.6 09/06/2014 0555   EOSABS 0.2 09/06/2014 0555   BASOSABS 0.0 09/06/2014 0555      Chemistry      Component Value Date/Time   NA 139 04/24/2015 1653   K 4.6 04/24/2015 1653   CL 106 04/24/2015 1653   CO2 24  04/24/2015 1653   BUN 10 04/24/2015 1653   CREATININE 0.79 04/24/2015 1653   CREATININE 0.70 09/02/2014 1535      Component Value Date/Time   CALCIUM 9.4 04/24/2015 1653   ALKPHOS 162* 04/24/2015 1653   AST 243* 04/24/2015 1653   ALT 205* 04/24/2015 1653   BILITOT 0.9 04/24/2015 1653         Ref Range 81yrago    Hgb A2 Quant 2.2 - 3.2 % 5.0 (H)   Hgb F Quant 0.0 - 2.0 % 1.1 (H)   Comments: (NOTE) Patient State         HbA2 Level     HbF Level ----------------------------------------------------------- Heterozygous B-thalassemia    4-9%        1-5% Homozygous B-thalassemia  Normal or increased  80-100% Heterozygous HPFH       Less than 1.5%    10-20% Homozygous HPFH         Absent       100%   Hgb S Quant 0.0 % 0.0   Hgb A 96.8 - 97.8 % 93.9 (L)   Hemoglobin Other 0.0 % 0.0   Comments: (NOTE) Interpretation -------------- Elevated A2 levels can be seen in Beta Thalassemia Trait and a variety of unstable hemoglobin variants. Elevations can  also be seen in acquired conditions such as leukemias, megaloblastic anemia and hyperthyroidism. Accurate interpretation of these results should include family history, hemoglobin, hematocrit, and mean corpuscular volume results, red cell morphology, serum iron binding capacity, knowledge of recent transfusions and other pertinent clinical findings. Reviewed by JOdis Hollingshead MD, PhD, FCAP (Electronic Signature on File) Performed at [facility]      Specimen Collected: 09/10/13 2:45 PM Last Resulted: 09/12/13 11:41 AM         PENDING LABS:   RADIOGRAPHIC STUDIES:  Mr 3d Recon At Scanner  06/04/2015   CLINICAL DATA:  Right upper quadrant pain. Elevated LFTs. Dilated common bile duct status post cholecystectomy.  EXAM: MRI ABDOMEN WITHOUT AND WITH CONTRAST (INCLUDING MRCP)  TECHNIQUE: Multiplanar multisequence MR imaging of the abdomen was performed both before and after the administration of intravenous contrast. Heavily T2-weighted images of the biliary and pancreatic ducts were obtained, and three-dimensional MRCP images were rendered by post processing.  CONTRAST:  249mMULTIHANCE GADOBENATE DIMEGLUMINE 529 MG/ML IV SOLN  COMPARISON:  05/14/2015  FINDINGS: Lower chest:  No pleural or pericardial effusion.  Hepatobiliary: There is enlargement of the caudate lobe of liver. The contour the liver is mildly irregular. Status post cholecystectomy. Increase caliber of the common bile duct without evidence for obstructing stone or mass. The common bile duct measures up to 8 mm. The arterial phase enhancement of the liver is slightly heterogeneous. Several peripheral areas of increased signal are identified on the arterial phase images only without corresponding precontrast T1 or T2 abnormality. These are favored to represent benign perfusion anomalies.  Pancreas: Negative.  Spleen: Negative  Adrenals/Urinary Tract: Normal appearance of the adrenal  glands. The kidneys are both unremarkable.  Stomach/Bowel: The stomach in the upper abdominal bowel loops are unremarkable.  Vascular/Lymphatic: Normal appearance of the abdominal aorta. No aneurysm. No upper abdominal adenopathy identified.  Other: No free fluid or fluid collections within the upper abdomen.  Musculoskeletal: Normal signal identified from within the bone marrow.  IMPRESSION: 1. Subtle changes within the liver which may be indicative of early cirrhosis. Consider further evaluation and risk stratification with ultrasound liver elastography. 2. Mild  increase caliber of the common bile duct status post cholecystectomy. No obstructing stone or mass noted.   Electronically Signed   By: Kerby Moors M.D.   On: 06/04/2015 09:33   Mr Abd W/wo Cm/mrcp  06/04/2015   CLINICAL DATA:  Right upper quadrant pain. Elevated LFTs. Dilated common bile duct status post cholecystectomy.  EXAM: MRI ABDOMEN WITHOUT AND WITH CONTRAST (INCLUDING MRCP)  TECHNIQUE: Multiplanar multisequence MR imaging of the abdomen was performed both before and after the administration of intravenous contrast. Heavily T2-weighted images of the biliary and pancreatic ducts were obtained, and three-dimensional MRCP images were rendered by post processing.  CONTRAST:  69m MULTIHANCE GADOBENATE DIMEGLUMINE 529 MG/ML IV SOLN  COMPARISON:  05/14/2015  FINDINGS: Lower chest:  No pleural or pericardial effusion.  Hepatobiliary: There is enlargement of the caudate lobe of liver. The contour the liver is mildly irregular. Status post cholecystectomy. Increase caliber of the common bile duct without evidence for obstructing stone or mass. The common bile duct measures up to 8 mm. The arterial phase enhancement of the liver is slightly heterogeneous. Several peripheral areas of increased signal are identified on the arterial phase images only without corresponding precontrast T1 or T2 abnormality. These are favored to represent benign perfusion  anomalies.  Pancreas: Negative.  Spleen: Negative  Adrenals/Urinary Tract: Normal appearance of the adrenal glands. The kidneys are both unremarkable.  Stomach/Bowel: The stomach in the upper abdominal bowel loops are unremarkable.  Vascular/Lymphatic: Normal appearance of the abdominal aorta. No aneurysm. No upper abdominal adenopathy identified.  Other: No free fluid or fluid collections within the upper abdomen.  Musculoskeletal: Normal signal identified from within the bone marrow.  IMPRESSION: 1. Subtle changes within the liver which may be indicative of early cirrhosis. Consider further evaluation and risk stratification with ultrasound liver elastography. 2. Mild increase caliber of the common bile duct status post cholecystectomy. No obstructing stone or mass noted.   Electronically Signed   By: TKerby MoorsM.D.   On: 06/04/2015 09:33   [place]domen Limited Ruq  05/14/2015   CLINICAL DATA:  Elevated liver function tests. History of gastric bypass and cholecystectomy. Right upper quadrant pain.  EXAM: [place]DOMEN LIMITED - RIGHT UPPER QUADRANT  COMPARISON:  None.  FINDINGS: Gallbladder:  Surgically absent.  Common bile duct:  Diameter: 10 mm, possibly within normal limits after cholecystectomy.  Liver:  No focal lesion identified. Within normal limits in parenchymal echogenicity.  IMPRESSION: Dilated common bile duct, possibly within normal limits after cholecystectomy. If further evaluation is desired, consider MR abdomen with MRCP without and with contrast in further evaluation.   Electronically Signed   By: MLorin PicketM.D.   On: 05/14/2015 09:42     PATHOLOGY:  Interpretation Peripheral Blood Flow Cytometry - NO MONOCLONAL B-CELL POPULATION OR ABNORMAL T-CELL PHENOTYPE. JVicente MalesMD Pathologist, Electronic Signature (Case signed 09/11/2013)    ASSESSMENT AND PLAN:  Thalassemia trait, beta Beta Thalassemia trait resulting in microcytic RBC without anemia.  Reactive leukocytosis  noted at time of surgery for Roux en Y in December 2015.  No further leukocytosis noted.    No further work-up necessary.  Vitamin studies are WNL in July 2016, except for an elevated ferritin level which is likely reactive.  Transaminitis is noted with negative imaging and the cause of this may be the cause of her ferritin elevation.  Ferritin level is not at a point of concern at this time.    She is at risk for vitamin deficiencies  secondary to Roux en Y.  I would recommend surveillance of B12, iron studies, folate, and Vit D levels every 6 months, unless otherwise indicated.  Additionally, I would check a serum copper level every year.  I will release the patient back to her primary care provider for surveillance and see her back if re-referred for any hematologic or oncologic reason.    THERAPY PLAN:  Release the patient from the clinic.  All questions were answered. The patient knows to call the clinic with any problems, questions or concerns. We can certainly see the patient much sooner if necessary.  Patient and plan discussed with Dr. Ancil Linsey and she is in agreement with the aforementioned.   This note is electronically signed by: Doy Mince 06/06/2015 3:59 PM

## 2015-06-06 NOTE — Assessment & Plan Note (Signed)
Beta Thalassemia trait resulting in microcytic RBC without anemia.  Reactive leukocytosis noted at time of surgery for Roux en Y in December 2015.  No further leukocytosis noted.    No further work-up necessary.  Vitamin studies are WNL in July 2016, except for an elevated ferritin level which is likely reactive.  Transaminitis is noted with negative imaging and the cause of this may be the cause of her ferritin elevation.  Ferritin level is not at a point of concern at this time.    She is at risk for vitamin deficiencies secondary to Roux en Y.  I would recommend surveillance of B12, iron studies, folate, and Vit D levels every 6 months, unless otherwise indicated.  Additionally, I would check a serum copper level every year.  I will release the patient back to her primary care provider for surveillance and see her back if re-referred for any hematologic or oncologic reason.

## 2015-06-06 NOTE — Patient Instructions (Signed)
[place] at [facility] Discharge Instructions  RECOMMENDATIONS MADE BY THE CONSULTANT AND ANY TEST RESULTS WILL BE SENT TO YOUR REFERRING PHYSICIAN.  1.  You are being released from the cancer center, but please know that we are here for should you need [place] in the future.  Thank you for choosing [place] at [facility] to provide your oncology and hematology care.  To afford each patient quality time with our provider, please arrive at least 15 minutes before your scheduled appointment time.    You need to re-schedule your appointment should you arrive 10 or more minutes late.  We strive to give you quality time with our providers, and arriving late affects you and other patients whose appointments are after yours.  Also, if you no show three or more times for appointments you may be dismissed from the clinic at the providers discretion.     Again, thank you for choosing [facility].  Our hope is that these requests will decrease the amount of time that you wait before being seen by our physicians.       _____________________________________________________________  Should you have questions after your visit to [facility], please contact our office at [phone] between the hours of 8:30 a.m. and 4:30 p.m.  Voicemails left after 4:30 p.m. will not be returned until the following business day.  For prescription refill requests, have your pharmacy contact our office.

## 2015-06-11 ENCOUNTER — Ambulatory Visit (HOSPITAL_COMMUNITY): Admission: RE | Admit: 2015-06-11 | Payer: BC Managed Care – PPO | Source: Ambulatory Visit

## 2015-06-11 ENCOUNTER — Other Ambulatory Visit (HOSPITAL_COMMUNITY): Payer: BC Managed Care – PPO

## 2015-06-16 ENCOUNTER — Ambulatory Visit (HOSPITAL_COMMUNITY): Payer: BC Managed Care – PPO | Admitting: Hematology & Oncology

## 2015-07-01 ENCOUNTER — Ambulatory Visit (INDEPENDENT_AMBULATORY_CARE_PROVIDER_SITE_OTHER): Payer: BC Managed Care – PPO | Admitting: Internal Medicine

## 2015-07-01 ENCOUNTER — Encounter (INDEPENDENT_AMBULATORY_CARE_PROVIDER_SITE_OTHER): Payer: Self-pay | Admitting: Internal Medicine

## 2015-07-01 VITALS — BP 128/74 | HR 60 | Temp 97.4°F | Resp 18 | Ht 62.0 in | Wt 236.0 lb

## 2015-07-01 DIAGNOSIS — R748 Abnormal levels of other serum enzymes: Secondary | ICD-10-CM

## 2015-07-01 DIAGNOSIS — K760 Fatty (change of) liver, not elsewhere classified: Secondary | ICD-10-CM

## 2015-07-01 LAB — HEPATIC FUNCTION PANEL
ALBUMIN: 3.6 g/dL (ref 3.6–5.1)
ALK PHOS: 156 U/L — AB (ref 33–115)
ALT: 56 U/L — ABNORMAL HIGH (ref 6–29)
AST: 80 U/L — AB (ref 10–35)
Bilirubin, Direct: 0.3 mg/dL — ABNORMAL HIGH (ref <=0.2)
Indirect Bilirubin: 0.6 mg/dL (ref 0.2–1.2)
TOTAL PROTEIN: 8.4 g/dL — AB (ref 6.1–8.1)
Total Bilirubin: 0.9 mg/dL (ref 0.2–1.2)

## 2015-07-01 LAB — IRON AND TIBC
%SAT: 40 % (ref 11–50)
Iron: 143 ug/dL (ref 40–190)
TIBC: 358 ug/dL (ref 250–450)
UIBC: 215 ug/dL (ref 125–400)

## 2015-07-01 LAB — PROTIME-INR
INR: 1.14 (ref <1.50)
Prothrombin Time: 14.7 seconds (ref 11.6–15.2)

## 2015-07-01 NOTE — Patient Instructions (Signed)
Physician will call with results of blood tests and further recommendations.

## 2015-07-01 NOTE — Progress Notes (Signed)
Presenting complaint;  Elevated transaminases.  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 She was last seen in our office on 09/06/2013 because of mildly elevated transaminases felt to be secondary to fatty liver. She has had mildly elevated transaminases dating back to 2001. 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 Even though she has lost 70 pounds her transaminases have more than doubled and remain so. She also reports her glucose levels have dropped and her hemoglobin A1c has tried from 6.33 years ago to 5.6 in July 2016. She she states she had an episode of feeling 2 weeks ago but it resolved spontaneously. She denies nausea vomiting or heartburn. She is on 34 small meals a day. She continues to complain of mild discomfort or which she describes to be abnormal sensation below the right costal margin laterally which she is had since gallbladder surgery 14 years ago. She states this sensation has not changed. There is no history of blood transfusion. She does not have tattoos and there is no history of accidental needlestick. Her bowels move daily. Stool consistency has changed to soft stool since her surgery. She has been walking 30 minutes virtually every day since her bariatric surgery. She has a brother age 61 who also has mildly elevated transaminases. She she had water markers repeated on 04/24/2015. Hepatitis B surface antigen, hepatitis C virus antibody, hepatitis B core IgM antibody and hepatitis A IgM antibody were all negative. She had upper abdominal ultrasound on 05/14/2015. CBD measured 10 mm. Liver echogenicity was normal. She had MRCP on 06/03/2015 revealing mildly dilated bile duct but no evidence of choledocholithiasis. Caudate lobe of the liver is enlarged and contour noted to be slightly irregular raising possibility of early cirrhosis.    Current  Medications: Outpatient Encounter Prescriptions as of 07/01/2015  Medication Sig  . ALPRAZolam (XANAX) 0.25 MG tablet Take 1 tablet (0.25 mg total) by mouth at bedtime as needed.  . Calcium Carb-Cholecalciferol (CALCIUM 600 + D) 600-200 MG-UNIT TABS Take 1 capsule by mouth 3 (three) times daily.   . Clobetasol Propionate 0.05 % shampoo Use once weekly to  shampoo affected areas of scalp for 4 weeks, then as needed  . clotrimazole-betamethasone (LOTRISONE) cream Apply twice daily to affected area (s) of scalp with rash for 10 days, then  as needed (Patient taking differently: Apply 1 application topically 2 (two) times daily as needed (rash on scalp.). )  . diphenhydrAMINE (BENADRYL) 50 MG capsule Take 50 mg by mouth as needed. Patient states that she will take 25 to 50 mg depending upon symptoms.  . ergocalciferol (VITAMIN D2) 50000 UNITS capsule Take 1 capsule (50,000 Units total) by mouth once a week. One capsule once weekly  . esomeprazole (NEXIUM) 20 MG packet Take 20 mg by mouth daily before breakfast.  . esomeprazole (NEXIUM) 40 MG capsule TAKE 1 CAPSULE BY MOUTH DAILY  . fluticasone (FLONASE) 50 MCG/ACT nasal spray Place 2 sprays into both nostrils daily as needed for allergies or rhinitis.  Marland Kitchen loratadine (CLARITIN) 10 MG tablet Take 10 mg by mouth daily as needed for allergies.   . metoprolol succinate (TOPROL-XL) 50 MG 24 hr tablet TAKE 1 TABLET BY MOUTH TWICE A DAY WITH OR IMMEDIATELY FOLLOWING A MEAL  . Multiple Vitamin (MULTIVITAMIN) capsule Take 1 capsule by mouth daily.  . Multiple Vitamins-Minerals (HAIR/SKIN/NAILS) TABS Take by mouth.  . nystatin cream (MYCOSTATIN) Apply 1 application topically  as needed.   . temazepam (RESTORIL) 15 MG capsule Take 1 capsule (15 mg total) by mouth at bedtime as needed for sleep.  Marland Kitchen venlafaxine (EFFEXOR) 37.5 MG tablet Take 1 tablet (37.5 mg total) by mouth 2 (two) times daily with a meal.   No facility-administered encounter medications on file as  of 07/01/2015.   Past medical history:  Chronic reflux esophagitis. She has had at least 3 EGDs in the past. Last EGD was in April 2012 revealing antral gastritis of the scar and biopsy revealed reactive changes. She had small esophageal nodule biopsied on EGD of August 2005 and was benign squamous mucosa. She was treated for H. pylori gastritis back in 2000. Over 16 year history of mildly elevated transaminases felt to be secondary to fatty liver. Hypertension. Obesity. 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 History of migraine and allergic sinusitis. History of elevated serum globulin levels. 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 Status post laparoscopic cholecystectomy in March 2000 with removal of single lymph node and was benign etiology. Preeclampsia with her first pregnancy. Tubal ligation in 1990. She had tubal pregnancy 1992 leading to laparotomy. 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 Insomnia and anxiety. Depression. Vitmain D deficiency. She has received hepatitis B vaccination in the past.   Allergies: Allergies  Allergen Reactions  . Codeine     Severe headache.   . Sulfonamide Derivatives Hives and Itching    Family history:  Father is 101 years old and has multiple health problems including coronary artery disease A. fib and was treated for intracranial aneurysm. Mother is 52 and in good health. She has one sister age 23 who has weight problems. She has a brother age 29 with mildly elevated transaminases. 4 maternal aunts were treated for colon carcinoma which was diagnosed while they were in their 41s. She has paternal aunts with lupus.  Social history:  She is married. 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 She does not smoke cigarettes or drink alcohol. She has 3 children. Her daughter has  bronchial asthma and her 2 sons are in good health.    Physical exam: Blood pressure 128/74, pulse 60, temperature 97.4 F (36.3 C), temperature source Oral, resp. rate 18, height 5' 2"  (1.575 m), weight 236 lb (107.049 kg). She is alert and in no acute distress. Conjunctiva is pink. Sclera is nonicteric Oropharyngeal mucosa is normal. She has few teeth missing but remaining in satisfactory condition. No neck masses or thyromegaly noted. Cardiac exam with regular rhythm normal S1 and S2. No murmur or gallop noted. Lungs are clear to auscultation. Abdomen is full but soft and nontender without organomegaly or masses. Liver images and distended below RCM. It does not appear to be enlarged. No LE edema or clubbing noted.  Labs/studies Results: AST and ALT were 77 and 49 on 09/10/2013  AST and ALT were 10/25/1970 1 01/22/2014  AST and ALT were 265 and 142 on 08/28/2014  AST and ALT were 243 and 205 on 04/24/2015   Serum iron was 159 on 10/03/2014 Serum ferritin 4091 04/24/2015 Serum ferritin was 181 on 06/26/2007 Serum ferritin was 11 on 05/01/2007  Assessment:  #1. Elevated transaminases. Patient has at least 15 history of mildly elevated transaminases felt to be secondary to fatty liver. Viral markers for hepatitis B and C have been negative on multiple occasions. She has  received hepatitis B vaccination in the past. She had bariatric surgery in December 2015 and her transaminases have not improved which is to be expected with discount of surgery and associated weight loss. Unless transaminases show an improvement trend we need to look for other causes of hepatocellular injury. She does not have any stigmata of cirrhosis. MR clearly shows a fatty liver. Mildly elevated serum ferritin would appear to be result of hepatocellular disease rather than cause. Since serum iron was mildly elevated 9 months ago will repeat iron studies and calculate saturation. #2. GERD. Heartburn is well  controlled with therapy. #3. Family history of CRC in 4 maternal aunts. Last colonoscopy was in April 2012 revealing hyperplastic polyps.   Recommendations:  Patient advised to discontinue hair nail and skin vitamin she can continue the other vitamin preparations. Patient encouraged to continue daily exercise for at least 30 minutes which she is doing. She will go to the lab for LFTs, as sedimentation rate, serum iron TIBC hepatitis B surface antibody and INR. Will also ask lab to free serum for 1 week in case further studies are indicated. I will be contacting patient results of studies and further recommendations. Next colonoscopy would be in April 2017. She will return for office visit in 3 months.

## 2015-07-02 LAB — HEPATITIS B SURFACE ANTIBODY,QUALITATIVE: HEP B S AB: NEGATIVE

## 2015-07-02 LAB — SEDIMENTATION RATE: Sed Rate: 14 mm/hr (ref 0–20)

## 2015-07-04 ENCOUNTER — Telehealth (INDEPENDENT_AMBULATORY_CARE_PROVIDER_SITE_OTHER): Payer: Self-pay | Admitting: *Deleted

## 2015-07-04 DIAGNOSIS — R7401 Elevation of levels of liver transaminase levels: Secondary | ICD-10-CM

## 2015-07-04 DIAGNOSIS — R740 Nonspecific elevation of levels of transaminase and lactic acid dehydrogenase [LDH]: Principal | ICD-10-CM

## 2015-07-04 NOTE — Telephone Encounter (Signed)
Per Dr.Rehman the patient will need to have labs drawn in 3 months prior to her OV.

## 2015-08-15 ENCOUNTER — Encounter (INDEPENDENT_AMBULATORY_CARE_PROVIDER_SITE_OTHER): Payer: Self-pay | Admitting: *Deleted

## 2015-09-15 ENCOUNTER — Encounter (INDEPENDENT_AMBULATORY_CARE_PROVIDER_SITE_OTHER): Payer: Self-pay | Admitting: *Deleted

## 2015-09-15 ENCOUNTER — Ambulatory Visit: Payer: BC Managed Care – PPO | Admitting: Dietician

## 2015-09-15 ENCOUNTER — Other Ambulatory Visit (INDEPENDENT_AMBULATORY_CARE_PROVIDER_SITE_OTHER): Payer: Self-pay | Admitting: *Deleted

## 2015-09-15 DIAGNOSIS — R740 Nonspecific elevation of levels of transaminase and lactic acid dehydrogenase [LDH]: Principal | ICD-10-CM

## 2015-09-15 DIAGNOSIS — R7401 Elevation of levels of liver transaminase levels: Secondary | ICD-10-CM

## 2015-09-16 ENCOUNTER — Ambulatory Visit: Payer: BC Managed Care – PPO | Admitting: Family Medicine

## 2015-09-16 ENCOUNTER — Encounter: Payer: Self-pay | Admitting: Family Medicine

## 2015-10-21 ENCOUNTER — Encounter (INDEPENDENT_AMBULATORY_CARE_PROVIDER_SITE_OTHER): Payer: Self-pay | Admitting: Internal Medicine

## 2015-10-21 ENCOUNTER — Ambulatory Visit (INDEPENDENT_AMBULATORY_CARE_PROVIDER_SITE_OTHER): Payer: BC Managed Care – PPO | Admitting: Internal Medicine

## 2015-11-27 ENCOUNTER — Other Ambulatory Visit: Payer: Self-pay | Admitting: Family Medicine

## 2015-11-28 ENCOUNTER — Other Ambulatory Visit: Payer: Self-pay | Admitting: Family Medicine

## 2015-12-01 ENCOUNTER — Other Ambulatory Visit: Payer: Self-pay | Admitting: Family Medicine

## 2015-12-01 ENCOUNTER — Other Ambulatory Visit: Payer: Self-pay

## 2015-12-01 ENCOUNTER — Telehealth: Payer: Self-pay

## 2015-12-01 DIAGNOSIS — E8881 Metabolic syndrome: Secondary | ICD-10-CM

## 2015-12-01 DIAGNOSIS — I10 Essential (primary) hypertension: Secondary | ICD-10-CM

## 2015-12-01 DIAGNOSIS — E559 Vitamin D deficiency, unspecified: Secondary | ICD-10-CM

## 2015-12-01 DIAGNOSIS — R7303 Prediabetes: Secondary | ICD-10-CM

## 2015-12-01 NOTE — Telephone Encounter (Signed)
Called and left message that I was going to mail her fasting lab order to her and she needed to call and make an appt in the next 4 weeks. And that labs needed to be done at least 3 days before her visit. Will mail her order today

## 2015-12-02 ENCOUNTER — Other Ambulatory Visit: Payer: Self-pay

## 2015-12-02 ENCOUNTER — Telehealth: Payer: Self-pay | Admitting: Family Medicine

## 2015-12-02 MED ORDER — ALPRAZOLAM 0.25 MG PO TABS: 0.2500 mg | ORAL_TABLET | Freq: Every evening | ORAL | Status: DC | PRN

## 2015-12-02 MED ORDER — METOPROLOL TARTRATE 100 MG PO TABS: 50.0000 mg | ORAL_TABLET | Freq: Two times a day (BID) | ORAL | Status: DC

## 2015-12-02 NOTE — Telephone Encounter (Signed)
Patient is asking for a refill on metoprolol succinate (TOPROL-XL) 50 MG 24 hr tablet

## 2015-12-02 NOTE — Telephone Encounter (Signed)
Refill her meds pls ,  1 month only, I believe she will come

## 2015-12-02 NOTE — Telephone Encounter (Signed)
Meds refilled.

## 2015-12-02 NOTE — Telephone Encounter (Signed)
Do you want to refill since patient has follow up on 3/28

## 2015-12-11 ENCOUNTER — Other Ambulatory Visit: Payer: Self-pay

## 2015-12-11 DIAGNOSIS — Z1231 Encounter for screening mammogram for malignant neoplasm of breast: Secondary | ICD-10-CM

## 2015-12-23 ENCOUNTER — Ambulatory Visit (INDEPENDENT_AMBULATORY_CARE_PROVIDER_SITE_OTHER): Payer: BC Managed Care – PPO | Admitting: Family Medicine

## 2015-12-23 ENCOUNTER — Ambulatory Visit: Payer: BC Managed Care – PPO

## 2015-12-23 ENCOUNTER — Encounter: Payer: Self-pay | Admitting: Family Medicine

## 2015-12-23 VITALS — BP 134/76 | HR 86 | Resp 18 | Ht 62.0 in | Wt 245.0 lb

## 2015-12-23 DIAGNOSIS — I10 Essential (primary) hypertension: Secondary | ICD-10-CM | POA: Diagnosis not present

## 2015-12-23 DIAGNOSIS — H6691 Otitis media, unspecified, right ear: Secondary | ICD-10-CM | POA: Insufficient documentation

## 2015-12-23 DIAGNOSIS — M79674 Pain in right toe(s): Secondary | ICD-10-CM | POA: Diagnosis not present

## 2015-12-23 DIAGNOSIS — G4700 Insomnia, unspecified: Secondary | ICD-10-CM | POA: Diagnosis not present

## 2015-12-23 DIAGNOSIS — J3089 Other allergic rhinitis: Secondary | ICD-10-CM

## 2015-12-23 DIAGNOSIS — Z6841 Body Mass Index (BMI) 40.0 and over, adult: Secondary | ICD-10-CM

## 2015-12-23 DIAGNOSIS — H6501 Acute serous otitis media, right ear: Secondary | ICD-10-CM | POA: Diagnosis not present

## 2015-12-23 DIAGNOSIS — M79676 Pain in unspecified toe(s): Secondary | ICD-10-CM | POA: Insufficient documentation

## 2015-12-23 DIAGNOSIS — F418 Other specified anxiety disorders: Secondary | ICD-10-CM

## 2015-12-23 DIAGNOSIS — L309 Dermatitis, unspecified: Secondary | ICD-10-CM

## 2015-12-23 MED ORDER — TEMAZEPAM 30 MG PO CAPS: 30.0000 mg | ORAL_CAPSULE | Freq: Every evening | ORAL | Status: DC | PRN

## 2015-12-23 MED ORDER — AZITHROMYCIN 250 MG PO TABS: ORAL_TABLET | ORAL | Status: DC

## 2015-12-23 MED ORDER — METOPROLOL TARTRATE 50 MG PO TABS: 50.0000 mg | ORAL_TABLET | Freq: Two times a day (BID) | ORAL | Status: DC

## 2015-12-23 MED ORDER — FLUCONAZOLE 150 MG PO TABS: ORAL_TABLET | ORAL | Status: DC

## 2015-12-23 MED ORDER — ALPRAZOLAM 0.25 MG PO TABS: 0.2500 mg | ORAL_TABLET | Freq: Every evening | ORAL | Status: DC | PRN

## 2015-12-23 NOTE — Progress Notes (Signed)
Subjective:    Patient ID: Holly Alvarado, female    DOB: June 19, 1966, 50 y.o.   MRN: [id number]  HPI    Holly Alvarado     MRN: [id number]      DOB: 1965-10-11   HPI Holly Alvarado is here for follow up and re-evaluation of chronic medical conditions, medication management and review of any available recent lab and radiology data.  Preventive health is updated, specifically  Cancer screening and Immunization.   Questions or concerns regarding consultations or procedures which the PT has had in the interim are  Addressed.Has been seeing surgery , c/o stationary weight, now recently referred to psychologist as she notes she eats even when not hungry. Has been diligent with exercise reportedly The PT reports intolerance to long acting metoprolol which she is being required to split, once to change back to short acting drug that she takes twice daily, script provided and she is advised to check several pharmacies if need be, she will call back with problems Inadequate sleep, only 4 hrs with medication , needs dose adjusted, is practicing good sleep hygiene. C/o intermittent fungal rah on lower abdominal fold, needs medication refilled   ROS Denies recent fever or chills. Denies sinus pressure, nasal congestion, ear pain or sore throat. Denies chest congestion, productive cough or wheezing. Denies chest pains, palpitations and leg swelling Denies abdominal pain, nausea, vomiting,diarrhea or constipation.   Denies dysuria, frequency, hesitancy or incontinence. Right great toe pain and trigger x 3 months, limiting ability to walk for exercise as she wishes to Denies headaches, seizures, numbness, or tingling. Denies depression, anxiety has inadequately treated insomnia, sleeps for 4 hrs with current medication insomnia.    PE  BP 134/76 mmHg  Pulse 86  Resp 18  Ht 5' 2"  (1.575 m)  Wt 245 lb (111.131 kg)  BMI 44.80 kg/m2  SpO2 98%  Patient alert and oriented and in no cardiopulmonary  distress.  HEENT Right TM erythematous with reduced light reflex, oropharynx not erythematous or edematous, no sinus tenderness, right anterior cervical adenitis Chest: Clear to auscultation bilaterally.  CVS: S1, S2 no murmurs, no S3.Regular rate.  ABD: Soft non tender.   Ext: No edema  MS: Adequate ROM spine, shoulders, hips and knees.Right great toe has restricted mobility with trigger  Skin: Intact, erythematous macular rash behind right eared.  Psych: Good eye contact, normal affect. Memory intact not anxious or depressed appearing.  CNS: CN 2-12 intact, power,  normal throughout.no focal deficits noted.   Assessment & Plan   Insomnia Inadequate , sleeps on avg 4 hrs with med, dose increase  Allergic rhinitis Uncontrolled, add astellin  Essential hypertension Controlled, but med changed to short acting metoprolol per pt request DASH diet and commitment to daily physical activity for a minimum of 30 minutes discussed and encouraged, as a part of hypertension management. The importance of attaining a healthy weight is also discussed.  BP/Weight 12/23/2015 07/01/2015 06/06/2015 06/03/2015 05/13/2015 04/24/2015 10/11/7260  Systolic BP 035 597 416 - - 384 -  Diastolic BP 76 74 82 - - 80 -  Wt. (Lbs) 245 236 243.1 243 243.5 242 271  BMI 44.8 43.15 44.45 44.43 44.53 44.25 49.55        ROM (right otitis media) Antibiotic prescribed  Depression with anxiety Controlled, no change in medication   Morbid obesity with BMI of 40.0-44.9, adult (HCC) Deteriorated. Patient re-educated about  the importance of commitment to a  minimum of 150 minutes of exercise  per week.  The importance of healthy food choices with portion control discussed. Encouraged to start a food diary, count calories and to consider  joining a support group. Sample diet sheets offered. Goals set by the patient for the next several months.   Weight /BMI 12/23/2015 07/01/2015 06/06/2015  WEIGHT 245 lb 236 lb  243 lb 1.6 oz  HEIGHT 5' 2"  5' 2"  -  BMI 44.8 kg/m2 43.15 kg/m2 44.45 kg/m2    Current exercise per week 150 minutes.   Eczema Erythematous rash behind right ear, advised OTC hydrocortisone cream twice daily      Review of Systems     Objective:   Physical Exam        Assessment & Plan:

## 2015-12-23 NOTE — Assessment & Plan Note (Signed)
Uncontrolled, add astellin

## 2015-12-23 NOTE — Patient Instructions (Signed)
F/u in 4.5 month, call if you need me sooner  Medication changed to metoprolol 50 mg as before, please take script with you to fill  New additional nasal spray for allergies, Astelin , continue current medication  Dose increase in medication for sleep use regularly  Use hydrocortisone cream on rash behind right ear  You are referred to local podiatrist re rigth great toee pain and trigger  No additional labs needed  Thanks for choosing [facility], we consider it a privelige to serve you.  All the best, you will continue on weight loss journey in the right direction!

## 2015-12-23 NOTE — Assessment & Plan Note (Signed)
Inadequate , sleeps on avg 4 hrs with med, dose increase

## 2015-12-24 DIAGNOSIS — L309 Dermatitis, unspecified: Secondary | ICD-10-CM | POA: Insufficient documentation

## 2015-12-24 NOTE — Assessment & Plan Note (Signed)
Erythematous rash behind right ear, advised OTC hydrocortisone cream twice daily

## 2015-12-24 NOTE — Assessment & Plan Note (Signed)
Deteriorated. Patient re-educated about  the importance of commitment to a  minimum of 150 minutes of exercise per week.  The importance of healthy food choices with portion control discussed. Encouraged to start a food diary, count calories and to consider  joining a support group. Sample diet sheets offered. Goals set by the patient for the next several months.   Weight /BMI 12/23/2015 07/01/2015 06/06/2015  WEIGHT 245 lb 236 lb 243 lb 1.6 oz  HEIGHT 5' 2"  5' 2"  -  BMI 44.8 kg/m2 43.15 kg/m2 44.45 kg/m2    Current exercise per week 150 minutes.

## 2015-12-24 NOTE — Assessment & Plan Note (Signed)
Antibiotic prescribed

## 2015-12-24 NOTE — Assessment & Plan Note (Signed)
Controlled, no change in medication  

## 2015-12-24 NOTE — Assessment & Plan Note (Signed)
Controlled, but med changed to short acting metoprolol per pt request DASH diet and commitment to daily physical activity for a minimum of 30 minutes discussed and encouraged, as a part of hypertension management. The importance of attaining a healthy weight is also discussed.  BP/Weight 12/23/2015 07/01/2015 06/06/2015 06/03/2015 05/13/2015 04/24/2015 0/40/4591  Systolic BP 368 599 234 - - 144 -  Diastolic BP 76 74 82 - - 80 -  Wt. (Lbs) 245 236 243.1 243 243.5 242 271  BMI 44.8 43.15 44.45 44.43 44.53 44.25 49.55

## 2015-12-29 ENCOUNTER — Encounter: Payer: Self-pay | Admitting: Dietician

## 2015-12-29 ENCOUNTER — Encounter: Payer: BC Managed Care – PPO | Attending: Surgery | Admitting: Dietician

## 2015-12-29 DIAGNOSIS — Z029 Encounter for administrative examinations, unspecified: Secondary | ICD-10-CM | POA: Diagnosis present

## 2015-12-29 NOTE — Patient Instructions (Addendum)
Goals:  Follow Phase 3B: High Protein + Non-Starchy Vegetables  Get back into eating 3-6 small meals/snacks, every 3-5 hrs  Keep healthy snacks on hand!  Increase lean protein foods to meet 60g goal  Increase fluid intake to 64oz +  Avoid drinking 15 minutes before, during and 30 minutes after eating  Get back into routine!  Drink Premier protein shake instead of Glucerna  Try a yogurt with less than 12 grams of carbs   Weigh yourself only 1x a week  Refer to Phase 3A lean proteins  Eat protein foods first, then vegetables  Use tomato sauce, low fat cream of chicken/mushroom/celery/onion soup, use light mayo and mustard for egg/chicken salad  Try freezing Greek yogurt in popsicle molds  When you don't feel like eating but you know you need to, have a Premier protein shake  Protein + non starchy vegetables!   Avoid corn and potatoes   Breakfast: Premier shake Snack: yogurt Lunch: Frozen meal (without starch)  Snack: cheese and deli meat Dinner: meat and non starchy vegetable Snack: sugar free hot chocolate OR sugar free jello OR homemade yogurt pop

## 2015-12-29 NOTE — Progress Notes (Signed)
  Follow-up visit:  16 months Post-Operative RYGB Surgery  Medical Nutrition Therapy:  Appt start time: 8118 end time:  1230  Primary concerns today: Post-operative Bariatric Surgery Nutrition Management.  Holly Alvarado returns having gained 2 pounds since last visit. She reports that she is here because she has not been doing well nutritionally. She states she has been having trouble sleeping and will eat at night when she cannot sleep. She has discussed this with her PCP and started taking a medication for sleep and is participating in "sleep hygiene" program. Reports that her weight dropped to 238 lbs. She has been eating out a lot and is trying to reduce this. States that she went through a day where all she took in was orange nabs and apple juice. Missing some teeth and unable to chew meats well enough. Hopes to have teeth fixed in 6 months. Notices that she has been eating when she is not hungry. Has made an appointment to follow up with Dr. Ardath Sax (psychologist).   Surgery date: 09/02/2014 Surgery type: RYGB Start weight at [facility]: 313 lbs on 05/30/14 (320 lbs per patient) Weight today: 245.5 lbs Weight change: 2 lbs gain Total weight lost: 74.5 lbs  TANITA  BODY COMP RESULTS  08/12/14 09/17/14 12/09/14 05/13/15 12/29/15   BMI (kg/m^2) 57.7 54.5 49.6 44.5 44.9   Fat Mass (lbs) 181 175.0 141.5 126.5 131   Fat Free Mass (lbs) 134.5 123.0 129.5 117 114.5   Total Body Water (lbs) 98.5 90.0 95 85.5 84    Preferred Learning Style:   No preference indicated   Learning Readiness:  Ready  Fluid intake: diet lemonade, protein shake, water, skim milk (unable to determine amount; patient reports insufficient) Estimated total protein intake: unable to determine  Medications: see list Supplementation: taking  Using straws: no (was noticing pain with straws) Drinking while eating: occasionally sips Hair loss: yes Carbonated beverages: sips, did not like it N/V/D/C: some constipation, resolved with  Miralax Dumping syndrome: yes with cereal  Recent physical activity:  Walking daily and 68mn low impact exercise video  Progress Towards Goal(s):  In progress.   Nutritional Diagnosis:  Prentiss-3.3 Overweight/obesity related to past poor dietary habits and physical inactivity as evidenced by patient w/ recent RYGB surgery following dietary guidelines for continued weight loss.     Intervention:  Nutrition counseling provided.  Teaching Method Utilized:  Visual Auditory  Barriers to learning/adherence to lifestyle change: family stress  Demonstrated degree of understanding via:  Teach Back   Monitoring/Evaluation:  Dietary intake, exercise, and body weight. Follow up in 6 weeks for 17 month post-op visit.

## 2015-12-30 ENCOUNTER — Ambulatory Visit: Payer: BC Managed Care – PPO

## 2016-01-08 ENCOUNTER — Encounter (INDEPENDENT_AMBULATORY_CARE_PROVIDER_SITE_OTHER): Payer: Self-pay | Admitting: *Deleted

## 2016-02-05 ENCOUNTER — Telehealth: Payer: Self-pay | Admitting: Family Medicine

## 2016-02-05 NOTE — Telephone Encounter (Signed)
Patient aware and given appointment for next week.  She will seek care if symptoms worsen or present similarly to stroke.

## 2016-02-05 NOTE — Telephone Encounter (Signed)
Patient is stating that the tip of her tongue and her lips are going numb off and on, shes dizzy, shes afraid that it could be her Blood Pressure medication, or possibly her anxiety medication that's out of her system

## 2016-02-05 NOTE — Telephone Encounter (Signed)
Called and left message for patient to return call.  

## 2016-02-05 NOTE — Telephone Encounter (Signed)
Patient states that symptoms x 1 week.  She checked blood pressure which was normal.  Heart rate was slightly bradycardic at 58.   She will have her outstanding labs done in the morning.  She had her last episode this am.  She has not took any Xanax in approx. 3 weeks.

## 2016-02-05 NOTE — Telephone Encounter (Signed)
Take her med for anxiety as prescribned see if this helps. Symptoms may also represenmt stroke so low threshold for ED or urgent care eval  MD visit next week to be scheduled

## 2016-02-10 ENCOUNTER — Ambulatory Visit: Payer: BC Managed Care – PPO | Admitting: Dietician

## 2016-02-12 ENCOUNTER — Encounter: Payer: Self-pay | Admitting: Family Medicine

## 2016-02-12 ENCOUNTER — Ambulatory Visit (INDEPENDENT_AMBULATORY_CARE_PROVIDER_SITE_OTHER): Payer: BC Managed Care – PPO | Admitting: Family Medicine

## 2016-02-12 VITALS — BP 150/94 | HR 62 | Resp 18 | Ht 62.0 in | Wt 252.0 lb

## 2016-02-12 DIAGNOSIS — F32A Depression, unspecified: Secondary | ICD-10-CM

## 2016-02-12 DIAGNOSIS — K219 Gastro-esophageal reflux disease without esophagitis: Secondary | ICD-10-CM

## 2016-02-12 DIAGNOSIS — I10 Essential (primary) hypertension: Secondary | ICD-10-CM | POA: Diagnosis not present

## 2016-02-12 DIAGNOSIS — F329 Major depressive disorder, single episode, unspecified: Secondary | ICD-10-CM

## 2016-02-12 DIAGNOSIS — G4700 Insomnia, unspecified: Secondary | ICD-10-CM

## 2016-02-12 DIAGNOSIS — F418 Other specified anxiety disorders: Secondary | ICD-10-CM

## 2016-02-12 DIAGNOSIS — Z6841 Body Mass Index (BMI) 40.0 and over, adult: Secondary | ICD-10-CM

## 2016-02-12 MED ORDER — AMLODIPINE BESYLATE 2.5 MG PO TABS: 2.5000 mg | ORAL_TABLET | Freq: Every day | ORAL | Status: DC

## 2016-02-12 MED ORDER — FLUOXETINE HCL 20 MG PO TABS: 20.0000 mg | ORAL_TABLET | Freq: Every day | ORAL | Status: DC
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
## 2016-02-12 NOTE — Patient Instructions (Addendum)
F/u in 7 weeks, call if you need me sooner  New additional blood pressure med, amlodipine one daily  New additional medication for depression and anxiety , fluoxetine, and  you are referred urgently to Psychiatry  Work excuse to return on 02/23/2016  Thank you  for choosing [facility]. We consider it a privelige to serve you.  Delivering excellent health care in a caring and  compassionate way is our goal.  Partnering with you,  so that together we can achieve this goal is our strategy.

## 2016-02-13 ENCOUNTER — Other Ambulatory Visit: Payer: Self-pay

## 2016-02-13 MED ORDER — VENLAFAXINE HCL 37.5 MG PO TABS: 37.5000 mg | ORAL_TABLET | Freq: Two times a day (BID) | ORAL | Status: DC

## 2016-02-15 NOTE — Assessment & Plan Note (Signed)
Controlled, no change in medication  

## 2016-02-15 NOTE — Assessment & Plan Note (Signed)
Uncontrolled, increase dose of amlodipine DASH diet and commitment to daily physical activity for a minimum of 30 minutes discussed and encouraged, as a part of hypertension management. The importance of attaining a healthy weight is also discussed.  BP/Weight 02/12/2016 12/29/2015 12/23/2015 07/01/2015 06/06/2015 06/03/2015 0/05/7948  Systolic BP 971 - 820 990 689 - -  Diastolic BP 94 - 76 74 82 - -  Wt. (Lbs) 252 245.5 245 236 243.1 243 243.5  BMI 46.08 44.89 44.8 43.15 44.45 44.43 44.53      Re eval in 6 to 7 weeks

## 2016-02-15 NOTE — Assessment & Plan Note (Signed)
Uncontrolled Sleep hygiene reviewed and written information offered also. Prescription sent for  medication needed.

## 2016-02-15 NOTE — Assessment & Plan Note (Signed)
Inadequately treated despite medication, with deterioration in past 4 months, and currently unable to perform on the job will start additional antidepressant and she is also referred to psych Chronic history , and will benefit from therapy also Work excuse and referrals entered, asap

## 2016-02-15 NOTE — Assessment & Plan Note (Signed)
Deteriorated. Patient re-educated about  the importance of commitment to a  minimum of 150 minutes of exercise per week.  The importance of healthy food choices with portion control discussed. Encouraged to start a food diary, count calories and to consider  joining a support group. Sample diet sheets offered. Goals set by the patient for the next several months.   Weight /BMI 02/12/2016 12/29/2015 12/23/2015  WEIGHT 252 lb 245 lb 8 oz 245 lb  HEIGHT 5' 2"  5' 2"  5' 2"   BMI 46.08 kg/m2 44.89 kg/m2 44.8 kg/m2    Current exercise per week 30 minutes.

## 2016-02-20 ENCOUNTER — Ambulatory Visit: Payer: BC Managed Care – PPO

## 2016-03-03 ENCOUNTER — Ambulatory Visit: Payer: BC Managed Care – PPO | Admitting: Dietician

## 2016-03-11 ENCOUNTER — Telehealth: Payer: Self-pay | Admitting: Family Medicine

## 2016-03-11 NOTE — Telephone Encounter (Signed)
pt called asking about her referral to psych... she hasn't heard anything

## 2016-03-11 NOTE — Telephone Encounter (Signed)
Message left to call behavioral health and left the phone number. Advised that they would only try once to schedule and they would leave a message and then wait for the patient to call and schedule.

## 2016-03-24 ENCOUNTER — Telehealth (HOSPITAL_COMMUNITY): Payer: Self-pay | Admitting: *Deleted

## 2016-03-24 NOTE — Telephone Encounter (Signed)
left voice message regarding an appointment. 

## 2016-03-31 ENCOUNTER — Encounter: Payer: Self-pay | Admitting: Family Medicine

## 2016-03-31 ENCOUNTER — Ambulatory Visit (INDEPENDENT_AMBULATORY_CARE_PROVIDER_SITE_OTHER): Payer: BC Managed Care – PPO | Admitting: Family Medicine

## 2016-03-31 VITALS — BP 104/70 | HR 76 | Resp 18 | Ht 62.0 in | Wt 252.0 lb

## 2016-03-31 DIAGNOSIS — R7303 Prediabetes: Secondary | ICD-10-CM

## 2016-03-31 DIAGNOSIS — G4700 Insomnia, unspecified: Secondary | ICD-10-CM

## 2016-03-31 DIAGNOSIS — F418 Other specified anxiety disorders: Secondary | ICD-10-CM | POA: Diagnosis not present

## 2016-03-31 DIAGNOSIS — B369 Superficial mycosis, unspecified: Secondary | ICD-10-CM | POA: Diagnosis not present

## 2016-03-31 DIAGNOSIS — F329 Major depressive disorder, single episode, unspecified: Secondary | ICD-10-CM

## 2016-03-31 DIAGNOSIS — F32A Depression, unspecified: Secondary | ICD-10-CM

## 2016-03-31 DIAGNOSIS — J3089 Other allergic rhinitis: Secondary | ICD-10-CM

## 2016-03-31 DIAGNOSIS — E559 Vitamin D deficiency, unspecified: Secondary | ICD-10-CM

## 2016-03-31 DIAGNOSIS — Z6841 Body Mass Index (BMI) 40.0 and over, adult: Secondary | ICD-10-CM

## 2016-03-31 DIAGNOSIS — I10 Essential (primary) hypertension: Secondary | ICD-10-CM | POA: Diagnosis not present

## 2016-03-31 DIAGNOSIS — M533 Sacrococcygeal disorders, not elsewhere classified: Secondary | ICD-10-CM | POA: Insufficient documentation

## 2016-03-31 DIAGNOSIS — K219 Gastro-esophageal reflux disease without esophagitis: Secondary | ICD-10-CM

## 2016-03-31 MED ORDER — CLOTRIMAZOLE-BETAMETHASONE 1-0.05 % EX CREA: TOPICAL_CREAM | CUTANEOUS | Status: DC

## 2016-03-31 MED ORDER — VENLAFAXINE HCL 75 MG PO TABS: 75.0000 mg | ORAL_TABLET | Freq: Two times a day (BID) | ORAL | Status: DC

## 2016-03-31 MED ORDER — TERBINAFINE HCL 250 MG PO TABS: 250.0000 mg | ORAL_TABLET | Freq: Every day | ORAL | Status: DC

## 2016-03-31 MED ORDER — AZELASTINE HCL 0.1 % NA SOLN: 2.0000 | Freq: Two times a day (BID) | NASAL | Status: DC

## 2016-03-31 NOTE — Patient Instructions (Addendum)
F/u in 3 months, call if you need me sooner  Fasting lipid, cmp and EGFR, TSH, Vit D, CBC in 3 month  Please schedule and keep mammogram appointment  Please schedule appointment with mental health  Increase effexor dose start today  Scripts given for Astelin, antifungal tablet and  cream, and also pls get  Xray of tail bone  Commit to taking medication for sleep every night also  Thank you  for choosing [facility]. We consider it a privelige to serve you.  Delivering excellent health care in a caring and  compassionate way is our goal.  Partnering with you,  so that together we can achieve this goal is our strategy.

## 2016-03-31 NOTE — Assessment & Plan Note (Addendum)
Rash to posterior neck and anterior left chest x 2 weeks, topical medication prescribed

## 2016-04-03 NOTE — Assessment & Plan Note (Signed)
Sleep hygiene reviewed and written information offered also. Prescription sent for  medication needed. Controlled, no change in medication

## 2016-04-03 NOTE — Assessment & Plan Note (Signed)
Deteriorated. Patient re-educated about  the importance of commitment to a  minimum of 150 minutes of exercise per week.  The importance of healthy food choices with portion control discussed. Encouraged to start a food diary, count calories and to consider  joining a support group. Sample diet sheets offered. Goals set by the patient for the next several months.   Weight /BMI 03/31/2016 02/12/2016 12/29/2015  WEIGHT 252 lb 252 lb 245 lb 8 oz  HEIGHT 5' 2"  5' 2"  5' 2"   BMI 46.08 kg/m2 46.08 kg/m2 44.89 kg/m2

## 2016-04-03 NOTE — Assessment & Plan Note (Signed)
increased pain no associated trauma , x ray ordered due to patient concern

## 2016-04-03 NOTE — Progress Notes (Signed)
Holly Alvarado     MRN: [id number]      DOB: 1966-06-26   HPI Ms. Wheeler is here for follow up and re-evaluation of chronic medical conditions, medication management and review of any available recent lab and radiology data.  Preventive health is updated, specifically  Cancer screening and Immunization.   Questions or concerns regarding consultations or procedures which the PT has had in the interim are  addressed. The PT denies any adverse reactions to current medications since the last visit. Has responded to medication but still has a "long way to go" C/o new onset tail bone pain in past 2 weeks with no inciting trauma C/o itchy rash x 1 week needs medication prescribed, rash is on posterior neck  ROS Denies recent fever or chills. Denies sinus pressure, nasal congestion, ear pain or sore throat. Denies chest congestion, productive cough or wheezing. Denies chest pains, palpitations and leg swelling Denies abdominal pain, nausea, vomiting,diarrhea or constipation.   Denies dysuria, frequency, hesitancy or incontinence. Denies joint pain, swelling and limitation in mobility. Denies headaches, seizures, numbness, or tingling.  PE  BP 104/70 mmHg  Pulse 76  Resp 18  Ht 5' 2"  (1.575 m)  Wt 252 lb (114.306 kg)  BMI 46.08 kg/m2  SpO2 99%  Patient alert and oriented and in no cardiopulmonary distress.  HEENT: No facial asymmetry, EOMI,   oropharynx pink and moist.  Neck supple no JVD, no mass.  Chest: Clear to auscultation bilaterally.  CVS: S1, S2 no murmurs, no S3.Regular rate.  ABD: Soft non tender.   Ext: No edema  MS: Adequate ROM spine, shoulders, hips and knees.  Skin: Intact, no ulcerations or rash noted.  Psych: Good eye contact, normal affect. Memory intact not anxious or depressed appearing.  CNS: CN 2-12 intact, power,  normal throughout.no focal deficits noted.   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 The importance of attaining a healthy weight is also discussed.  BP/Weight 03/31/2016 02/12/2016 12/29/2015 12/23/2015 07/01/2015 7/0/6237 02/26/8314  Systolic BP 176 160 - 737 106 269 -  Diastolic BP 70 94 - 76 74 82 -  Wt. (Lbs) 252 252 245.5 245 236 243.1 243  BMI 46.08 46.08 44.89 44.8 43.15 44.45 44.43        Tail bone pain increased pain no associated trauma , x ray ordered due to patient concern  Depression Improved , though not at goal, dose increase in medication , pt also encouraged to ensure that she makes and keeps appointment with therapist  Insomnia Sleep hygiene reviewed and written information offered also. Prescription sent for  medication needed. Controlled, no change in medication   Morbid obesity with BMI of 40.0-44.9, adult (HCC) Deteriorated. Patient re-educated about  the importance of commitment to a  minimum of 150 minutes of exercise per week.  The importance of healthy food choices with portion control discussed. Encouraged to start a food diary, count calories and to consider  joining a support group. Sample diet sheets offered. Goals set by the patient for the next several months.   Weight /BMI 03/31/2016 02/12/2016 12/29/2015  WEIGHT 252 lb 252 lb 245 lb 8 oz  HEIGHT 5' 2"  5' 2"  5' 2"   BMI 46.08 kg/m2 46.08 kg/m2  44.89 kg/m2       Allergic rhinitis Uncontrolled with increased symptoms, medication sent in  GERD Controlled, no change in medication   Prediabetes Patient educated about the importance of limiting  Carbohydrate intake , the need to commit to daily physical activity for a minimum of 30 minutes , and to commit weight loss. The fact that changes in all these areas will reduce or eliminate all together  the development of diabetes is stressed.  Updated lab needed at/ before next visit.   Diabetic Labs Latest Ref Rng 04/24/2015 10/03/2014 09/02/2014 08/28/2014 07/04/2014  HbA1c <5.7 % 5.6 - - - -  Chol 125 - 200 mg/dL 106(L) 90 - - -  HDL >=46 mg/dL 31(L) 19(L) - - -  Calc LDL <130 mg/dL 58 52 - - -  Triglycerides <150 mg/dL 84 94 - - -  Creatinine 0.50 - 1.10 mg/dL 0.79 0.65 0.70 0.62 0.69   BP/Weight 03/31/2016 02/12/2016 12/29/2015 12/23/2015 07/01/2015 03/29/4286 03/04/1156  Systolic BP 262 035 - 597 416 384 -  Diastolic BP 70 94 - 76 74 82 -  Wt. (Lbs) 252 252 245.5 245 236 243.1 243  BMI 46.08 46.08 44.89 44.8 43.15 44.45 44.43   No flowsheet data found.

## 2016-04-03 NOTE — Assessment & Plan Note (Signed)
Improved , though not at goal, dose increase in medication , pt also encouraged to ensure that she makes and keeps appointment with therapist

## 2016-04-03 NOTE — Assessment & Plan Note (Signed)
Controlled, no change in medication  

## 2016-04-03 NOTE — Assessment & Plan Note (Signed)
Controlled, no change in medication DASH diet and commitment to daily physical activity for a minimum of 30 minutes discussed and encouraged, as a part of hypertension management. The importance of attaining a healthy weight is also discussed.  BP/Weight 03/31/2016 02/12/2016 12/29/2015 12/23/2015 07/01/2015 03/30/7184 5/0/1586  Systolic BP 825 749 - 355 217 471 -  Diastolic BP 70 94 - 76 74 82 -  Wt. (Lbs) 252 252 245.5 245 236 243.1 243  BMI 46.08 46.08 44.89 44.8 43.15 44.45 44.43

## 2016-04-03 NOTE — Assessment & Plan Note (Signed)
Uncontrolled with increased symptoms, medication sent in

## 2016-04-03 NOTE — Assessment & Plan Note (Signed)
Patient educated about the importance of limiting  Carbohydrate intake , the need to commit to daily physical activity for a minimum of 30 minutes , and to commit weight loss. The fact that changes in all these areas will reduce or eliminate all together the development of diabetes is stressed.  Updated lab needed at/ before next visit.   Diabetic Labs Latest Ref Rng 04/24/2015 10/03/2014 09/02/2014 08/28/2014 07/04/2014  HbA1c <5.7 % 5.6 - - - -  Chol 125 - 200 mg/dL 106(L) 90 - - -  HDL >=46 mg/dL 31(L) 19(L) - - -  Calc LDL <130 mg/dL 58 52 - - -  Triglycerides <150 mg/dL 84 94 - - -  Creatinine 0.50 - 1.10 mg/dL 0.79 0.65 0.70 0.62 0.69   BP/Weight 03/31/2016 02/12/2016 12/29/2015 12/23/2015 07/01/2015 12/30/4582 04/29/5074  Systolic BP 732 256 - 720 919 802 -  Diastolic BP 70 94 - 76 74 82 -  Wt. (Lbs) 252 252 245.5 245 236 243.1 243  BMI 46.08 46.08 44.89 44.8 43.15 44.45 44.43   No flowsheet data found.

## 2016-04-05 ENCOUNTER — Ambulatory Visit (HOSPITAL_COMMUNITY)
Admission: RE | Admit: 2016-04-05 | Discharge: 2016-04-05 | Disposition: A | Discharge status: Home / Self Care | Payer: BC Managed Care – PPO | Source: Ambulatory Visit | Attending: Family Medicine | Admitting: Family Medicine

## 2016-04-05 ENCOUNTER — Encounter: Payer: Self-pay | Admitting: Family Medicine

## 2016-04-05 DIAGNOSIS — M533 Sacrococcygeal disorders, not elsewhere classified: Secondary | ICD-10-CM | POA: Diagnosis present

## 2016-04-12 ENCOUNTER — Telehealth (HOSPITAL_COMMUNITY): Payer: Self-pay | Admitting: *Deleted

## 2016-04-12 NOTE — Telephone Encounter (Signed)
left voice message regarding an appointment with Peggy. 

## 2016-04-16 ENCOUNTER — Ambulatory Visit
Admission: RE | Admit: 2016-04-16 | Discharge: 2016-04-16 | Disposition: A | Discharge status: Home / Self Care | Payer: BC Managed Care – PPO | Source: Ambulatory Visit

## 2016-04-16 DIAGNOSIS — Z1231 Encounter for screening mammogram for malignant neoplasm of breast: Secondary | ICD-10-CM

## 2016-05-06 ENCOUNTER — Other Ambulatory Visit: Payer: Self-pay | Admitting: Family Medicine

## 2016-05-11 ENCOUNTER — Ambulatory Visit: Payer: BC Managed Care – PPO | Admitting: Family Medicine

## 2016-06-08 ENCOUNTER — Ambulatory Visit (INDEPENDENT_AMBULATORY_CARE_PROVIDER_SITE_OTHER): Payer: BC Managed Care – PPO | Admitting: Psychiatry

## 2016-06-08 ENCOUNTER — Encounter (HOSPITAL_COMMUNITY): Payer: Self-pay | Admitting: Psychiatry

## 2016-06-08 VITALS — BP 159/112 | HR 59 | Ht 62.0 in | Wt 257.6 lb

## 2016-06-08 DIAGNOSIS — G4700 Insomnia, unspecified: Secondary | ICD-10-CM

## 2016-06-08 DIAGNOSIS — F411 Generalized anxiety disorder: Secondary | ICD-10-CM

## 2016-06-08 MED ORDER — TEMAZEPAM 30 MG PO CAPS: 30.0000 mg | ORAL_CAPSULE | Freq: Every evening | ORAL | 4 refills | Status: DC | PRN

## 2016-06-08 MED ORDER — ALPRAZOLAM 0.25 MG PO TABS: 0.2500 mg | ORAL_TABLET | Freq: Every evening | ORAL | 3 refills | Status: DC | PRN

## 2016-06-08 MED ORDER — VENLAFAXINE HCL ER 150 MG PO CP24: 150.0000 mg | ORAL_CAPSULE | Freq: Every day | ORAL | 2 refills | Status: DC
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
## 2016-06-10 ENCOUNTER — Other Ambulatory Visit: Payer: Self-pay | Admitting: Family Medicine

## 2016-06-11 ENCOUNTER — Ambulatory Visit (INDEPENDENT_AMBULATORY_CARE_PROVIDER_SITE_OTHER): Payer: BC Managed Care – PPO | Admitting: Family Medicine

## 2016-06-11 ENCOUNTER — Encounter: Payer: Self-pay | Admitting: Family Medicine

## 2016-06-11 VITALS — BP 124/84 | HR 67 | Resp 16 | Ht 62.0 in | Wt 261.0 lb

## 2016-06-11 DIAGNOSIS — Z13820 Encounter for screening for osteoporosis: Secondary | ICD-10-CM

## 2016-06-11 DIAGNOSIS — J3089 Other allergic rhinitis: Secondary | ICD-10-CM

## 2016-06-11 DIAGNOSIS — I10 Essential (primary) hypertension: Secondary | ICD-10-CM | POA: Diagnosis not present

## 2016-06-11 DIAGNOSIS — M2550 Pain in unspecified joint: Secondary | ICD-10-CM | POA: Diagnosis not present

## 2016-06-11 DIAGNOSIS — Z23 Encounter for immunization: Secondary | ICD-10-CM

## 2016-06-11 DIAGNOSIS — F418 Other specified anxiety disorders: Secondary | ICD-10-CM | POA: Diagnosis not present

## 2016-06-11 DIAGNOSIS — K219 Gastro-esophageal reflux disease without esophagitis: Secondary | ICD-10-CM

## 2016-06-11 MED ORDER — ESOMEPRAZOLE MAGNESIUM 40 MG PO CPDR: 40.0000 mg | DELAYED_RELEASE_CAPSULE | Freq: Every day | ORAL | 3 refills | Status: DC

## 2016-06-11 MED ORDER — KETOROLAC TROMETHAMINE 60 MG/2ML IM SOLN
60.0000 mg | Freq: Once | INTRAMUSCULAR | Status: AC
  Administered 2016-06-11: 60 mg via INTRAMUSCULAR

## 2016-06-11 MED ORDER — CYCLOBENZAPRINE HCL 10 MG PO TABS: 10.0000 mg | ORAL_TABLET | Freq: Every day | ORAL | 1 refills | Status: DC

## 2016-06-11 MED ORDER — METHYLPREDNISOLONE ACETATE 80 MG/ML IJ SUSP
80.0000 mg | Freq: Once | INTRAMUSCULAR | Status: AC
  Administered 2016-06-11: 80 mg via INTRAMUSCULAR

## 2016-06-11 MED ORDER — PREDNISONE 5 MG (21) PO TBPK: 5.0000 mg | ORAL_TABLET | ORAL | 0 refills | Status: DC

## 2016-06-11 NOTE — Assessment & Plan Note (Signed)
Controlled, no change in medication  

## 2016-06-11 NOTE — Assessment & Plan Note (Signed)
Generalized pain involving low back, right hip and right knee, refer to ortho toradol and depomedrol in office, prednisone dose  Pack and muscle relaxant

## 2016-06-11 NOTE — Patient Instructions (Addendum)
Cancel October appointment, and f/u in 3 month, call if you need  Me sooner  Flu vaccine today  Injections today for back, hip and knee pain and prednisone, flexeril and omeprazole prescribed  You are referred to Dr Aline Brochure for furhter evaluation  Thank you  for choosing [facility]. We consider it a privelige to serve you.  Delivering excellent health care in a caring and  compassionate way is our goal.  Partnering with you,  so that together we can achieve this goal is our strategy.   Please work on good  health habits so that your health will improve. 1. Commitment to daily physical activity for 30 to 60  minutes, if you are able to do this.  2. Commitment to wise food choices. Aim for half of your  food intake to be vegetable and fruit, one quarter starchy foods, and one quarter protein. Try to eat on a regular schedule  3 meals per day, snacking between meals should be limited to vegetables or fruits or small portions of nuts. 64 ounces of water per day is generally recommended, unless you have specific health conditions, like heart failure or kidney failure where you will need to limit fluid intake.  3. Commitment to sufficient and a  good quality of physical and mental rest daily, generally between 6 to 8 hours per day.  WITH PERSISTANCE AND PERSEVERANCE, THE IMPOSSIBLE , BECOMES THE NORM!

## 2016-06-11 NOTE — Assessment & Plan Note (Signed)
Improved since last visit, now in care of psych which is good,  Not tearful, not suicidal or homicidal

## 2016-06-11 NOTE — Assessment & Plan Note (Signed)
Controlled, no change in medication DASH diet and commitment to daily physical activity for a minimum of 30 minutes discussed and encouraged, as a part of hypertension management. The importance of attaining a healthy weight is also discussed.  BP/Weight 06/11/2016 03/31/2016 02/12/2016 12/29/2015 12/23/2015 16/09/958 12/30/4096  Systolic BP 119 147 829 - 562 130 865  Diastolic BP 84 70 94 - 76 74 82  Wt. (Lbs) 261 252 252 245.5 245 236 243.1  BMI 47.74 46.08 46.08 44.89 44.8 43.15 44.45  Some encounter information is confidential and restricted. Go to Review Flowsheets activity to see all data.

## 2016-06-11 NOTE — Progress Notes (Signed)
   Holly Alvarado     MRN: [id number]      DOB: 09/25/1966   HPI Holly Alvarado is here with a 3 month h/o worsening low back, right hip and right knee, right calf pain, initial; injury was stepping out of a tub, now very limited in even pressing gas pedal and getting out of a car Broke toe on right foot before June, following this she has had a  downhill process Has started with psychiatry and believes this wiill be very beneficial to her, still has ongoing weight gain, uses food as a crutch  ROS Denies recent fever or chills. Denies sinus pressure, nasal congestion, ear pain or sore throat. Denies chest congestion, productive cough or wheezing. Denies chest pains, palpitations and leg swelling Denies abdominal pain, nausea, vomiting,diarrhea or constipation.   Denies dysuria, frequency, hesitancy or incontinence.  Denies headaches, seizures, numbness, or tingling. Denies ucnontrolled depression, anxiety or insomnia. Denies skin break down or rash.   PE  BP 124/84   Pulse 67   Resp 16   Ht 5' 2"  (1.575 m)   Wt 261 lb (118.4 kg)   SpO2 99%   BMI 47.74 kg/m   Patient alert and oriented and in no cardiopulmonary distress.  HEENT: No facial asymmetry, EOMI,   oropharynx pink and moist.  Neck supple no JVD, no mass.  Chest: Clear to auscultation bilaterally.  CVS: S1, S2 no murmurs, no S3.Regular rate.  ABD: Soft non tender.   Ext: No edema  MS: decreased  ROM spine,rigth  hip and knee.  Skin: Intact, no ulcerations or rash noted.  Psych: Good eye contact, normal affect. Memory intact not anxious or depressed appearing.  CNS: CN 2-12 intact, power,  normal throughout.no focal deficits noted.   Assessment & Plan  Joint pain Generalized pain involving low back, right hip and right knee, refer to ortho toradol and depomedrol in office, prednisone dose  Pack and muscle relaxant  Depression with anxiety Improved since last visit, now in care of psych which is good,  Not  tearful, not suicidal or homicidal  GERD Controlled, no change in medication   Essential hypertension Controlled, no change in medication DASH diet and commitment to daily physical activity for a minimum of 30 minutes discussed and encouraged, as a part of hypertension management. The importance of attaining a healthy weight is also discussed.  BP/Weight 06/11/2016 03/31/2016 02/12/2016 12/29/2015 12/23/2015 51/03/6159 03/29/7105  Systolic BP 269 485 462 - 703 500 938  Diastolic BP 84 70 94 - 76 74 82  Wt. (Lbs) 261 252 252 245.5 245 236 243.1  BMI 47.74 46.08 46.08 44.89 44.8 43.15 44.45  Some encounter information is confidential and restricted. Go to Review Flowsheets activity to see all data.       Allergic rhinitis Controlled, no change in medication

## 2016-06-14 NOTE — Addendum Note (Signed)
Addended by: Eual Fines on: 06/14/2016 11:15 AM   Modules accepted: Orders

## 2016-07-01 ENCOUNTER — Encounter (HOSPITAL_COMMUNITY): Payer: Self-pay | Admitting: Psychiatry

## 2016-07-01 ENCOUNTER — Ambulatory Visit (INDEPENDENT_AMBULATORY_CARE_PROVIDER_SITE_OTHER): Payer: BC Managed Care – PPO | Admitting: Psychiatry

## 2016-07-01 DIAGNOSIS — F411 Generalized anxiety disorder: Secondary | ICD-10-CM
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
## 2016-07-05 ENCOUNTER — Ambulatory Visit: Payer: BC Managed Care – PPO | Admitting: Family Medicine

## 2016-07-20 ENCOUNTER — Encounter (HOSPITAL_COMMUNITY): Payer: Self-pay | Admitting: Psychiatry

## 2016-07-20 ENCOUNTER — Ambulatory Visit (INDEPENDENT_AMBULATORY_CARE_PROVIDER_SITE_OTHER): Payer: BC Managed Care – PPO | Admitting: Psychiatry

## 2016-07-20 VITALS — BP 142/76 | HR 61 | Ht 62.0 in | Wt 262.8 lb

## 2016-07-20 DIAGNOSIS — F411 Generalized anxiety disorder: Secondary | ICD-10-CM

## 2016-07-20 DIAGNOSIS — Z8249 Family history of ischemic heart disease and other diseases of the circulatory system: Secondary | ICD-10-CM | POA: Diagnosis not present

## 2016-07-20 DIAGNOSIS — Z813 Family history of other psychoactive substance abuse and dependence: Secondary | ICD-10-CM

## 2016-07-20 DIAGNOSIS — Z811 Family history of alcohol abuse and dependence: Secondary | ICD-10-CM | POA: Diagnosis not present

## 2016-07-20 DIAGNOSIS — Z818 Family history of other mental and behavioral disorders: Secondary | ICD-10-CM

## 2016-07-20 DIAGNOSIS — Z79899 Other long term (current) drug therapy: Secondary | ICD-10-CM

## 2016-07-20 MED ORDER — ALPRAZOLAM 0.5 MG PO TABS: 0.5000 mg | ORAL_TABLET | Freq: Two times a day (BID) | ORAL | 2 refills | Status: DC | PRN

## 2016-07-20 MED ORDER — VENLAFAXINE HCL ER 150 MG PO CP24: 150.0000 mg | ORAL_CAPSULE | Freq: Every day | ORAL | 2 refills | Status: DC
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
## 2016-07-22 ENCOUNTER — Ambulatory Visit (INDEPENDENT_AMBULATORY_CARE_PROVIDER_SITE_OTHER): Payer: BC Managed Care – PPO | Admitting: Psychiatry

## 2016-07-22 ENCOUNTER — Encounter (HOSPITAL_COMMUNITY): Payer: Self-pay | Admitting: Psychiatry

## 2016-07-22 DIAGNOSIS — F411 Generalized anxiety disorder: Secondary | ICD-10-CM
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
## 2016-08-05 ENCOUNTER — Ambulatory Visit (HOSPITAL_COMMUNITY): Payer: Self-pay | Admitting: Psychiatry

## 2016-08-06 ENCOUNTER — Encounter: Payer: Self-pay | Admitting: Family Medicine

## 2016-08-06 ENCOUNTER — Encounter (HOSPITAL_COMMUNITY): Payer: Self-pay | Admitting: Psychiatry

## 2016-08-06 ENCOUNTER — Ambulatory Visit (INDEPENDENT_AMBULATORY_CARE_PROVIDER_SITE_OTHER): Payer: BC Managed Care – PPO | Admitting: Psychiatry

## 2016-08-06 DIAGNOSIS — F411 Generalized anxiety disorder: Secondary | ICD-10-CM
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
## 2016-08-23 ENCOUNTER — Ambulatory Visit (INDEPENDENT_AMBULATORY_CARE_PROVIDER_SITE_OTHER): Payer: BC Managed Care – PPO | Admitting: Psychiatry

## 2016-08-23 ENCOUNTER — Encounter (HOSPITAL_COMMUNITY): Payer: Self-pay | Admitting: Psychiatry

## 2016-08-23 DIAGNOSIS — F411 Generalized anxiety disorder: Secondary | ICD-10-CM
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
## 2016-09-06 ENCOUNTER — Ambulatory Visit (HOSPITAL_COMMUNITY): Payer: Self-pay | Admitting: Psychiatry

## 2016-09-10 ENCOUNTER — Encounter (HOSPITAL_COMMUNITY): Payer: Self-pay | Admitting: Psychiatry

## 2016-09-10 ENCOUNTER — Ambulatory Visit: Payer: Self-pay | Admitting: Family Medicine

## 2016-09-10 ENCOUNTER — Ambulatory Visit (INDEPENDENT_AMBULATORY_CARE_PROVIDER_SITE_OTHER): Payer: BC Managed Care – PPO | Admitting: Psychiatry

## 2016-09-10 VITALS — BP 140/95 | HR 67 | Ht 62.0 in | Wt 272.8 lb

## 2016-09-10 DIAGNOSIS — Z79899 Other long term (current) drug therapy: Secondary | ICD-10-CM

## 2016-09-10 DIAGNOSIS — Z808 Family history of malignant neoplasm of other organs or systems: Secondary | ICD-10-CM

## 2016-09-10 DIAGNOSIS — F411 Generalized anxiety disorder: Secondary | ICD-10-CM | POA: Diagnosis not present

## 2016-09-10 DIAGNOSIS — Z8261 Family history of arthritis: Secondary | ICD-10-CM

## 2016-09-10 DIAGNOSIS — Z823 Family history of stroke: Secondary | ICD-10-CM

## 2016-09-10 DIAGNOSIS — Z9889 Other specified postprocedural states: Secondary | ICD-10-CM | POA: Diagnosis not present

## 2016-09-10 DIAGNOSIS — Z818 Family history of other mental and behavioral disorders: Secondary | ICD-10-CM | POA: Diagnosis not present

## 2016-09-10 DIAGNOSIS — Z882 Allergy status to sulfonamides status: Secondary | ICD-10-CM

## 2016-09-10 DIAGNOSIS — Z825 Family history of asthma and other chronic lower respiratory diseases: Secondary | ICD-10-CM

## 2016-09-10 DIAGNOSIS — Z8249 Family history of ischemic heart disease and other diseases of the circulatory system: Secondary | ICD-10-CM | POA: Diagnosis not present

## 2016-09-10 DIAGNOSIS — Z811 Family history of alcohol abuse and dependence: Secondary | ICD-10-CM

## 2016-09-10 DIAGNOSIS — Z833 Family history of diabetes mellitus: Secondary | ICD-10-CM

## 2016-09-10 DIAGNOSIS — Z813 Family history of other psychoactive substance abuse and dependence: Secondary | ICD-10-CM

## 2016-09-10 DIAGNOSIS — Z888 Allergy status to other drugs, medicaments and biological substances status: Secondary | ICD-10-CM

## 2016-09-10 MED ORDER — VENLAFAXINE HCL ER 150 MG PO CP24: 150.0000 mg | ORAL_CAPSULE | Freq: Every day | ORAL | 2 refills | Status: DC

## 2016-09-10 MED ORDER — ZOLPIDEM TARTRATE 10 MG PO TABS: 10.0000 mg | ORAL_TABLET | Freq: Every evening | ORAL | 2 refills | Status: DC | PRN

## 2016-09-10 MED ORDER — ALPRAZOLAM 0.5 MG PO TABS: 0.5000 mg | ORAL_TABLET | Freq: Two times a day (BID) | ORAL | 2 refills | Status: DC | PRN
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
## 2016-09-14 ENCOUNTER — Other Ambulatory Visit: Payer: Self-pay | Admitting: Family Medicine

## 2016-09-14 DIAGNOSIS — I10 Essential (primary) hypertension: Secondary | ICD-10-CM

## 2016-09-16 ENCOUNTER — Encounter: Payer: Self-pay | Admitting: Family Medicine

## 2016-09-16 ENCOUNTER — Ambulatory Visit: Payer: Self-pay | Admitting: Family Medicine

## 2016-09-23 ENCOUNTER — Ambulatory Visit (INDEPENDENT_AMBULATORY_CARE_PROVIDER_SITE_OTHER): Payer: BC Managed Care – PPO | Admitting: Psychiatry

## 2016-09-23 ENCOUNTER — Encounter (HOSPITAL_COMMUNITY): Payer: Self-pay | Admitting: Psychiatry

## 2016-09-23 DIAGNOSIS — F411 Generalized anxiety disorder: Secondary | ICD-10-CM
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
## 2016-10-06 ENCOUNTER — Ambulatory Visit (HOSPITAL_COMMUNITY): Payer: BC Managed Care – PPO | Admitting: Psychiatry

## 2016-10-20 ENCOUNTER — Encounter (HOSPITAL_COMMUNITY): Payer: Self-pay | Admitting: Psychiatry

## 2016-10-20 ENCOUNTER — Ambulatory Visit (INDEPENDENT_AMBULATORY_CARE_PROVIDER_SITE_OTHER): Payer: BC Managed Care – PPO | Admitting: Psychiatry

## 2016-10-20 DIAGNOSIS — F411 Generalized anxiety disorder: Secondary | ICD-10-CM | POA: Diagnosis not present
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
## 2016-11-03 ENCOUNTER — Ambulatory Visit (HOSPITAL_COMMUNITY): Payer: BC Managed Care – PPO | Admitting: Psychiatry

## 2016-11-10 ENCOUNTER — Telehealth (HOSPITAL_COMMUNITY): Payer: Self-pay | Admitting: *Deleted

## 2016-11-10 NOTE — Telephone Encounter (Signed)
left voice message, provider out of office 11/17/16.

## 2016-11-10 NOTE — Telephone Encounter (Signed)
phone call regarding appointment on 11/17/16.  message said patient is not available.

## 2016-11-17 ENCOUNTER — Ambulatory Visit (HOSPITAL_COMMUNITY): Payer: Self-pay | Admitting: Psychiatry

## 2016-12-01 ENCOUNTER — Ambulatory Visit (HOSPITAL_COMMUNITY): Payer: Self-pay | Admitting: Psychiatry

## 2016-12-09 ENCOUNTER — Ambulatory Visit (HOSPITAL_COMMUNITY): Payer: Self-pay | Admitting: Psychiatry

## 2016-12-13 ENCOUNTER — Other Ambulatory Visit (HOSPITAL_COMMUNITY): Payer: Self-pay | Admitting: Psychiatry

## 2017-01-27 ENCOUNTER — Telehealth: Payer: Self-pay | Admitting: Family Medicine

## 2017-01-27 NOTE — Telephone Encounter (Signed)
Three day h/o epigastric pain rated at a 9 wosening with nausea and itching , advised to got to ed

## 2017-01-28 ENCOUNTER — Telehealth: Payer: Self-pay | Admitting: Family Medicine

## 2017-01-28 DIAGNOSIS — I10 Essential (primary) hypertension: Secondary | ICD-10-CM

## 2017-01-28 DIAGNOSIS — E559 Vitamin D deficiency, unspecified: Secondary | ICD-10-CM

## 2017-01-28 DIAGNOSIS — Z6841 Body Mass Index (BMI) 40.0 and over, adult: Secondary | ICD-10-CM

## 2017-01-28 DIAGNOSIS — E8881 Metabolic syndrome: Secondary | ICD-10-CM

## 2017-01-28 DIAGNOSIS — R7303 Prediabetes: Secondary | ICD-10-CM

## 2017-01-28 NOTE — Telephone Encounter (Signed)
Labs ordered.

## 2017-01-28 NOTE — Telephone Encounter (Signed)
Patient called and said Dr Moshe Cipro told her to come in next Thursday for an appt and to call to get her lab orders for that appt.  [phone]

## 2017-02-01 LAB — COMPREHENSIVE METABOLIC PANEL
ALT: 41 U/L — ABNORMAL HIGH (ref 6–29)
AST: 77 U/L — ABNORMAL HIGH (ref 10–35)
Albumin: 3.3 g/dL — ABNORMAL LOW (ref 3.6–5.1)
Alkaline Phosphatase: 125 U/L (ref 33–130)
BUN: 5 mg/dL — ABNORMAL LOW (ref 7–25)
CHLORIDE: 108 mmol/L (ref 98–110)
CO2: 28 mmol/L (ref 20–31)
Calcium: 8.5 mg/dL — ABNORMAL LOW (ref 8.6–10.4)
Creat: 0.6 mg/dL (ref 0.50–1.05)
Glucose, Bld: 93 mg/dL (ref 65–99)
POTASSIUM: 3.4 mmol/L — AB (ref 3.5–5.3)
Sodium: 143 mmol/L (ref 135–146)
TOTAL PROTEIN: 7.5 g/dL (ref 6.1–8.1)
Total Bilirubin: 0.8 mg/dL (ref 0.2–1.2)

## 2017-02-01 LAB — LIPID PANEL
Cholesterol: 129 mg/dL (ref <200)
HDL: 28 mg/dL — AB (ref >50)
LDL CALC: 79 mg/dL (ref <100)
TRIGLYCERIDES: 110 mg/dL (ref <150)
Total CHOL/HDL Ratio: 4.6 Ratio (ref <5.0)
VLDL: 22 mg/dL (ref <30)

## 2017-02-01 LAB — CBC
HEMATOCRIT: 40.9 % (ref 35.0–45.0)
HEMOGLOBIN: 13.2 g/dL (ref 11.7–15.5)
MCH: 23.6 pg — ABNORMAL LOW (ref 27.0–33.0)
MCHC: 32.3 g/dL (ref 32.0–36.0)
MCV: 73 fL — AB (ref 80.0–100.0)
MPV: 10 fL (ref 7.5–12.5)
Platelets: 154 10*3/uL (ref 140–400)
RBC: 5.6 MIL/uL — AB (ref 3.80–5.10)
RDW: 16.2 % — ABNORMAL HIGH (ref 11.0–15.0)
WBC: 6.2 10*3/uL (ref 3.8–10.8)

## 2017-02-01 LAB — TSH: TSH: 1.47 m[IU]/L

## 2017-02-02 LAB — VITAMIN D 25 HYDROXY (VIT D DEFICIENCY, FRACTURES): Vit D, 25-Hydroxy: 16 ng/mL — ABNORMAL LOW (ref 30–100)

## 2017-02-03 ENCOUNTER — Ambulatory Visit (INDEPENDENT_AMBULATORY_CARE_PROVIDER_SITE_OTHER): Payer: BC Managed Care – PPO | Admitting: Family Medicine

## 2017-02-03 ENCOUNTER — Telehealth (HOSPITAL_COMMUNITY): Payer: Self-pay | Admitting: *Deleted

## 2017-02-03 VITALS — HR 82 | Resp 16 | Ht 62.0 in | Wt 291.0 lb

## 2017-02-03 DIAGNOSIS — I10 Essential (primary) hypertension: Secondary | ICD-10-CM | POA: Diagnosis not present

## 2017-02-03 DIAGNOSIS — E559 Vitamin D deficiency, unspecified: Secondary | ICD-10-CM | POA: Diagnosis not present

## 2017-02-03 DIAGNOSIS — K219 Gastro-esophageal reflux disease without esophagitis: Secondary | ICD-10-CM | POA: Diagnosis not present

## 2017-02-03 DIAGNOSIS — E8881 Metabolic syndrome: Secondary | ICD-10-CM | POA: Diagnosis not present

## 2017-02-03 DIAGNOSIS — L03313 Cellulitis of chest wall: Secondary | ICD-10-CM

## 2017-02-03 DIAGNOSIS — F418 Other specified anxiety disorders: Secondary | ICD-10-CM

## 2017-02-03 DIAGNOSIS — F5104 Psychophysiologic insomnia: Secondary | ICD-10-CM

## 2017-02-03 MED ORDER — FLUCONAZOLE 150 MG PO TABS: ORAL_TABLET | ORAL | 0 refills | Status: DC

## 2017-02-03 MED ORDER — ERGOCALCIFEROL 1.25 MG (50000 UT) PO CAPS: 50000.0000 [IU] | ORAL_CAPSULE | ORAL | 1 refills | Status: DC

## 2017-02-03 MED ORDER — CEPHALEXIN 500 MG PO CAPS: 500.0000 mg | ORAL_CAPSULE | Freq: Three times a day (TID) | ORAL | 0 refills | Status: DC

## 2017-02-03 NOTE — Patient Instructions (Signed)
f/u in 4.5 month, call if you need me sooner  Non fast vit D and cmp in 4.5 month  Very happy that you are back in the office and ready to resume health care!  I am referring you to [facility] for help with nutrition   Please schedule with Behavioral health as we discussed  Keflex and fluconazole prescribed for spider bite  Start weekly vitamin d also prescribed  Once daily OTC potassium 50m   Once daily OTC multivitamin like flinstone    I will refer you for colonoscopy with dr RLaural Goldenif due , will check with him also  Thank you  for choosing [facility]. We consider it a privelige to serve you.  Delivering excellent health care in a caring and  compassionate way is our goal.  Partnering with you,  so that together we can achieve this goal is our strategy.

## 2017-02-03 NOTE — Telephone Encounter (Addendum)
Pt walked into office stating she would like to resch her appts with both Dr. Harrington Challenger and Maurice Small. Staff informed pt due to no show policy message will have to be put back to providers first. Pt agreed and verbalized understanding. Pt no showed with Dr. Harrington Challenger on 12-09-2016. Pt no showed for [facility] 12-01-2016, 11-03-2016 and 10-06-2016. Pt number is [phone].

## 2017-02-06 ENCOUNTER — Encounter: Payer: Self-pay | Admitting: Family Medicine

## 2017-02-06 DIAGNOSIS — L0390 Cellulitis, unspecified: Secondary | ICD-10-CM | POA: Insufficient documentation

## 2017-02-06 NOTE — Progress Notes (Signed)
Holly Alvarado     MRN: [id number]      DOB: April 26, 1966   HPI Ms. Curb is here for follow up and re-evaluation of chronic medical conditions, medication management and review of any available recent lab and radiology data.  Bitten by an insect, she thinks a spider several days ago and the are has become increasingly red and itchy, no purulent drainage, d generalized myalgias, fever or chills. States she has been embarrassed to return to office as sh has been uncontrolled in terms of diet and exercise and has been gradually regaining all of the weight she lost wih surgery, blames a lot of this on her family who do not understand her need to avoid social gatherings for eating out  Has been missing therapy and needs to return as she recognizes that lack of emotional control contributes to her poor health  ROS Denies recent fever or chills. Denies sinus pressure, nasal congestion, ear pain or sore throat. Denies chest congestion, productive cough or wheezing. Denies chest pains, palpitations and leg swelling Denies abdominal pain, nausea, vomiting,diarrhea or constipation.   Denies dysuria, frequency, hesitancy or incontinence. Denies joint pain, swelling and limitation in mobility. Denies headaches, seizures, numbness, or tingling.  PE  Pulse 82   Resp 16   Ht 5' 2"  (1.575 m)   Wt 291 lb (132 kg)   SpO2 100%   BMI 53.22 kg/m   Patient alert and oriented and in no cardiopulmonary distress.  HEENT: No facial asymmetry, EOMI,   oropharynx pink and moist.  Neck supple no JVD, no mass.  Chest: Clear to auscultation bilaterally.  CVS: S1, S2 no murmurs, no S3.Regular rate.  ABD: Soft non tender.   Ext: No edema  MS: Adequate ROM spine, shoulders, hips and knees.  Skin: erythematous macular patch on left chest diameter approx 3.5cm  Psych: Good eye contact, normal affect. Memory intact not anxious or depressed appearing.  CNS: CN 2-12 intact, power,  normal throughout.no focal  deficits noted.   Assessment & Plan  Morbid obesity ([place]) She is s/p gastric bypass and is rapidly regaining weight will refer to nutrtionist she requests Deteriorated. Patient re-educated about  the importance of commitment to a  minimum of 150 minutes of exercise per week.  The importance of healthy food choices with portion control discussed. Encouraged to start a food diary, count calories and to consider  joining a support group. Sample diet sheets offered. Goals set by the patient for the next several months.   Weight /BMI 02/03/2017 06/11/2016 03/31/2016  WEIGHT 291 lb 261 lb 252 lb  HEIGHT 5' 2"  5' 2"  5' 2"   BMI 53.22 kg/m2 47.74 kg/m2 46.08 kg/m2  Some encounter information is confidential and restricted. Go to Review Flowsheets activity to see all data.      Cellulitis Keflex prescribed with fluconazole as needed  Depression with anxiety Uncontrolled , needs to return to therapy , and she continues taking her medication  Vitamin D deficiency Needs to take weekly vit D and this is prescribed  Insomnia Sleep hygiene reviewed and written information offered also. Prescription sent for  medication needed.   Essential hypertension Controlled, no change in medication DASH diet and commitment to daily physical activity for a minimum of 30 minutes discussed and encouraged, as a part of hypertension management. The importance of attaining a healthy weight is also discussed.  BP/Weight 02/03/2017 06/11/2016 03/31/2016 02/12/2016 12/29/2015 12/23/2015 54/02/5034  Systolic BP - 465 681 275 - 134 128  Diastolic BP - 84 70 94 - 76 74  Wt. (Lbs) 291 261 252 252 245.5 245 236  BMI 53.22 47.74 46.08 46.08 44.89 44.8 43.15  Some encounter information is confidential and restricted. Go to Review Flowsheets activity to see all data.       GERD Controlled, no change in medication

## 2017-02-06 NOTE — Assessment & Plan Note (Signed)
Keflex prescribed with fluconazole as needed

## 2017-02-06 NOTE — Assessment & Plan Note (Signed)
Controlled, no change in medication DASH diet and commitment to daily physical activity for a minimum of 30 minutes discussed and encouraged, as a part of hypertension management. The importance of attaining a healthy weight is also discussed.  BP/Weight 02/03/2017 06/11/2016 03/31/2016 02/12/2016 12/29/2015 12/23/2015 50/01/3975  Systolic BP - 734 193 790 - 240 973  Diastolic BP - 84 70 94 - 76 74  Wt. (Lbs) 291 261 252 252 245.5 245 236  BMI 53.22 47.74 46.08 46.08 44.89 44.8 43.15  Some encounter information is confidential and restricted. Go to Review Flowsheets activity to see all data.

## 2017-02-06 NOTE — Assessment & Plan Note (Signed)
Sleep hygiene reviewed and written information offered also. Prescription sent for  medication needed.  

## 2017-02-06 NOTE — Assessment & Plan Note (Signed)
Uncontrolled , needs to return to therapy , and she continues taking her medication

## 2017-02-06 NOTE — Assessment & Plan Note (Signed)
Needs to take weekly vit D and this is prescribed

## 2017-02-06 NOTE — Assessment & Plan Note (Addendum)
She is s/p gastric bypass and is rapidly regaining weight will refer to nutrtionist she requests Deteriorated. Patient re-educated about  the importance of commitment to a  minimum of 150 minutes of exercise per week.  The importance of healthy food choices with portion control discussed. Encouraged to start a food diary, count calories and to consider  joining a support group. Sample diet sheets offered. Goals set by the patient for the next several months.   Weight /BMI 02/03/2017 06/11/2016 03/31/2016  WEIGHT 291 lb 261 lb 252 lb  HEIGHT 5' 2"  5' 2"  5' 2"   BMI 53.22 kg/m2 47.74 kg/m2 46.08 kg/m2  Some encounter information is confidential and restricted. Go to Review Flowsheets activity to see all data.

## 2017-02-06 NOTE — Assessment & Plan Note (Signed)
Controlled, no change in medication  

## 2017-02-07 ENCOUNTER — Ambulatory Visit: Payer: Self-pay | Admitting: Family Medicine

## 2017-02-11 ENCOUNTER — Telehealth: Payer: Self-pay

## 2017-02-11 NOTE — Telephone Encounter (Signed)
Referred to nutritionist

## 2017-02-25 ENCOUNTER — Other Ambulatory Visit: Payer: Self-pay | Admitting: Family Medicine

## 2017-03-09 ENCOUNTER — Other Ambulatory Visit: Payer: Self-pay | Admitting: Family Medicine

## 2017-03-09 DIAGNOSIS — I10 Essential (primary) hypertension: Secondary | ICD-10-CM

## 2017-03-11 ENCOUNTER — Other Ambulatory Visit: Payer: Self-pay

## 2017-03-11 MED ORDER — METOPROLOL TARTRATE 50 MG PO TABS: 50.0000 mg | ORAL_TABLET | Freq: Two times a day (BID) | ORAL | 1 refills | Status: DC

## 2017-03-17 ENCOUNTER — Telehealth (HOSPITAL_COMMUNITY): Payer: Self-pay | Admitting: *Deleted

## 2017-03-17 NOTE — Telephone Encounter (Signed)
I agree

## 2017-03-17 NOTE — Telephone Encounter (Signed)
Office received new pt ref form wrfm to sch an appt for pt. Called [phone] and [phone] and lmtcb to sch new pt appt.

## 2017-03-17 NOTE — Telephone Encounter (Signed)
We can give her one more chance to show up

## 2017-03-17 NOTE — Telephone Encounter (Signed)
lmtcb to sch appt for pt with both Dr. Harrington Challenger and Maurice Small.

## 2017-03-17 NOTE — Telephone Encounter (Signed)
noted 

## 2017-04-01 NOTE — Telephone Encounter (Signed)
lmtcb to sch f/u appt

## 2017-04-04 ENCOUNTER — Other Ambulatory Visit: Payer: Self-pay | Admitting: Family Medicine

## 2017-04-04 DIAGNOSIS — Z1231 Encounter for screening mammogram for malignant neoplasm of breast: Secondary | ICD-10-CM

## 2017-04-06 ENCOUNTER — Encounter (HOSPITAL_COMMUNITY): Payer: Self-pay

## 2017-04-08 ENCOUNTER — Ambulatory Visit (HOSPITAL_COMMUNITY): Payer: Self-pay | Admitting: Psychiatry

## 2017-04-18 ENCOUNTER — Ambulatory Visit (INDEPENDENT_AMBULATORY_CARE_PROVIDER_SITE_OTHER): Payer: BC Managed Care – PPO | Admitting: Psychiatry

## 2017-04-18 ENCOUNTER — Encounter (HOSPITAL_COMMUNITY): Payer: Self-pay | Admitting: Psychiatry

## 2017-04-18 DIAGNOSIS — F411 Generalized anxiety disorder: Secondary | ICD-10-CM | POA: Diagnosis not present
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
## 2017-04-19 ENCOUNTER — Ambulatory Visit (INDEPENDENT_AMBULATORY_CARE_PROVIDER_SITE_OTHER): Payer: BC Managed Care – PPO | Admitting: Psychiatry

## 2017-04-19 ENCOUNTER — Encounter (HOSPITAL_COMMUNITY): Payer: Self-pay | Admitting: Psychiatry

## 2017-04-19 VITALS — BP 150/88 | HR 66 | Ht 62.0 in | Wt 288.8 lb

## 2017-04-19 DIAGNOSIS — F411 Generalized anxiety disorder: Secondary | ICD-10-CM | POA: Diagnosis not present

## 2017-04-19 DIAGNOSIS — F5104 Psychophysiologic insomnia: Secondary | ICD-10-CM | POA: Diagnosis not present

## 2017-04-19 DIAGNOSIS — Z818 Family history of other mental and behavioral disorders: Secondary | ICD-10-CM

## 2017-04-19 DIAGNOSIS — Z813 Family history of other psychoactive substance abuse and dependence: Secondary | ICD-10-CM | POA: Diagnosis not present

## 2017-04-19 DIAGNOSIS — Z811 Family history of alcohol abuse and dependence: Secondary | ICD-10-CM | POA: Diagnosis not present

## 2017-04-19 MED ORDER — ZOLPIDEM TARTRATE 10 MG PO TABS: 10.0000 mg | ORAL_TABLET | Freq: Every evening | ORAL | 2 refills | Status: DC | PRN

## 2017-04-19 MED ORDER — VENLAFAXINE HCL ER 150 MG PO CP24: 150.0000 mg | ORAL_CAPSULE | Freq: Every day | ORAL | 2 refills | Status: DC
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
## 2017-04-28 ENCOUNTER — Ambulatory Visit
Admission: RE | Admit: 2017-04-28 | Discharge: 2017-04-28 | Disposition: A | Discharge status: Home / Self Care | Payer: BC Managed Care – PPO | Source: Ambulatory Visit | Attending: Family Medicine | Admitting: Family Medicine

## 2017-04-28 DIAGNOSIS — Z1231 Encounter for screening mammogram for malignant neoplasm of breast: Secondary | ICD-10-CM

## 2017-05-03 ENCOUNTER — Encounter: Payer: BC Managed Care – PPO | Attending: Family Medicine | Admitting: Nutrition

## 2017-05-03 DIAGNOSIS — E669 Obesity, unspecified: Secondary | ICD-10-CM | POA: Insufficient documentation

## 2017-05-03 DIAGNOSIS — Z713 Dietary counseling and surveillance: Secondary | ICD-10-CM | POA: Diagnosis not present

## 2017-05-03 DIAGNOSIS — F419 Anxiety disorder, unspecified: Secondary | ICD-10-CM | POA: Diagnosis not present

## 2017-05-03 DIAGNOSIS — Z9884 Bariatric surgery status: Secondary | ICD-10-CM | POA: Diagnosis not present

## 2017-05-03 DIAGNOSIS — Z6841 Body Mass Index (BMI) 40.0 and over, adult: Secondary | ICD-10-CM | POA: Diagnosis not present

## 2017-05-03 NOTE — Patient Instructions (Addendum)
Goals 1. Eat three balanced meals 2. Take MVI, Calcium and Vit D daily. 3. Eat 2 svg of vegetables with lunch and dinner 4. Cut  artificial sweaters and drink only water. Exercise by walking 10 minutes a day. Keep a Food Journal daily.

## 2017-05-03 NOTE — Progress Notes (Signed)
  Medical Nutrition Therapy:  Appt start time: 1000 end time:  1100.  Assessment:  Primary concerns today: Obesity. BMI 53.89. Had gastric bypass 2 yrs ago at [facility] [place] [facility] and lost from 320 lbs down to 242 lbs and now has gained back to 289 lbs. She lives with her husband. Works as a Education officer, museum. Admits to emotionally eating and losing control. Admits to anxiety issues. Working with a therapist to help work through her emotional eating issues and triggers.   She has not been taking her multivitamins or calcium. Has been put on Vit D due to low levels.      Eats 2-3 meals per day. She admits she craves sweets, misc junk food and fast food. Doesn't eat a lot of vegetables on regular basis. Hasn't been exercising.    Current diet exceeds her needs as evidenced by BMI of 52 and weight gain of 50+ lbs in the last year.   She denies having DM.  AST/ALT elevated: 77 and 41.  Lipid Panel     Component Value Date/Time   CHOL 129 02/01/2017 0706   TRIG 110 02/01/2017 0706   HDL 28 (L) 02/01/2017 0706   CHOLHDL 4.6 02/01/2017 0706   VLDL 22 02/01/2017 0706   LDLCALC 79 02/01/2017 0706   CMP Latest Ref Rng & Units 02/01/2017 07/01/2015 04/24/2015  Glucose 65 - 99 mg/dL 93 - 81  BUN 7 - 25 mg/dL 5(L) - 10  Creatinine 0.50 - 1.05 mg/dL 0.60 - 0.79  Sodium 135 - 146 mmol/L 143 - 139  Potassium 3.5 - 5.3 mmol/L 3.4(L) - 4.6  Chloride 98 - 110 mmol/L 108 - 106  CO2 20 - 31 mmol/L 28 - 24  Calcium 8.6 - 10.4 mg/dL 8.5(L) - 9.4  Total Protein 6.1 - 8.1 g/dL 7.5 8.4(H) 8.6(H)  Total Bilirubin 0.2 - 1.2 mg/dL 0.8 0.9 0.9  Alkaline Phos 33 - 130 U/L 125 156(H) 162(H)  AST 10 - 35 U/L 77(H) 80(H) 243(H)  ALT 6 - 29 U/L 41(H) 56(H) 205(H)   Lab Results  Component Value Date   HGBA1C 5.6 04/24/2015   Preferred Learning Style:   No preference indicated   Learning Readiness:    Ready  Change in progress   MEDICATIONS: see list   DIETARY INTAKE:  She eats 2-3 meals per day. Loves [place]t [place]  tea.  Usual physical activity: ADL  Estimated energy needs: 1200  calories 135 g carbohydrates 90  g protein 33 g fat  Progress Towards Goal(s):  In progress.   Nutritional Diagnosis:  NI-1.5 Excessive energy intake As related to high fat high sugar high salt diet.  As evidenced by BMI 52.    Intervention:  My Plate, portion sizes, meal planning, chewing, high protein foods, timing of meals, benefits of exercise, emotional eating, and post bariatric surgery guidelines.   Goals 1. Eat three balanced meals 2. Take MVI, Calcium and Vit D daily. 3. Eat 2 svg of vegetables with lunch and dinner 4. Cut  artificial sweaters and drink only water. Exercise by walking 10 minutes a day. Keep a Food Journal daily.  Teaching Method Utilized:  Visual Auditory Hands on  Handouts given during visit include:  The Plate Method  Post Gastric Bypass Recommendations  Meal Plan Card   Barriers to learning/adherence to lifestyle change: none  Demonstrated degree of understanding via:  Teach Back   Monitoring/Evaluation:  Dietary intake, exercise, meal planning, and body weight in 1 month(s).

## 2017-05-05 ENCOUNTER — Encounter (HOSPITAL_COMMUNITY): Payer: Self-pay | Admitting: Psychiatry

## 2017-05-05 ENCOUNTER — Ambulatory Visit (INDEPENDENT_AMBULATORY_CARE_PROVIDER_SITE_OTHER): Payer: BC Managed Care – PPO | Admitting: Psychiatry

## 2017-05-05 DIAGNOSIS — F411 Generalized anxiety disorder: Secondary | ICD-10-CM | POA: Diagnosis not present
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
## 2017-05-19 ENCOUNTER — Ambulatory Visit (INDEPENDENT_AMBULATORY_CARE_PROVIDER_SITE_OTHER): Payer: BC Managed Care – PPO | Admitting: Psychiatry

## 2017-05-19 ENCOUNTER — Encounter (HOSPITAL_COMMUNITY): Payer: Self-pay | Admitting: Psychiatry

## 2017-05-19 DIAGNOSIS — F411 Generalized anxiety disorder: Secondary | ICD-10-CM
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
## 2017-05-26 ENCOUNTER — Telehealth (HOSPITAL_COMMUNITY): Payer: Self-pay | Admitting: *Deleted

## 2017-05-26 NOTE — Telephone Encounter (Signed)
Due to provider wanting staff to reorganize the sch, staff realized that pt was scheduled in a slot that was not suppose to be sch. Called pt at home and mobile number to resch appt and staff was not able to reach pt. Staff called pt with number provided on file and called pt work and still was not able to reach pt. Message on pt home and mobile number stated the person you are calling is not available at this time.Staff unable to sch appt at this time.

## 2017-06-01 ENCOUNTER — Encounter (HOSPITAL_COMMUNITY): Payer: Self-pay | Admitting: Psychiatry

## 2017-06-01 ENCOUNTER — Ambulatory Visit (INDEPENDENT_AMBULATORY_CARE_PROVIDER_SITE_OTHER): Payer: BC Managed Care – PPO | Admitting: Psychiatry

## 2017-06-01 VITALS — BP 149/98 | HR 64 | Ht 62.0 in | Wt 289.0 lb

## 2017-06-01 DIAGNOSIS — F419 Anxiety disorder, unspecified: Secondary | ICD-10-CM | POA: Diagnosis not present

## 2017-06-01 DIAGNOSIS — Z811 Family history of alcohol abuse and dependence: Secondary | ICD-10-CM | POA: Diagnosis not present

## 2017-06-01 DIAGNOSIS — Z818 Family history of other mental and behavioral disorders: Secondary | ICD-10-CM

## 2017-06-01 DIAGNOSIS — G4700 Insomnia, unspecified: Secondary | ICD-10-CM

## 2017-06-01 DIAGNOSIS — F411 Generalized anxiety disorder: Secondary | ICD-10-CM

## 2017-06-01 DIAGNOSIS — F332 Major depressive disorder, recurrent severe without psychotic features: Secondary | ICD-10-CM | POA: Diagnosis not present

## 2017-06-01 DIAGNOSIS — Z62811 Personal history of psychological abuse in childhood: Secondary | ICD-10-CM | POA: Diagnosis not present

## 2017-06-01 DIAGNOSIS — Z813 Family history of other psychoactive substance abuse and dependence: Secondary | ICD-10-CM | POA: Diagnosis not present

## 2017-06-01 MED ORDER — ALPRAZOLAM 0.5 MG PO TABS: 0.5000 mg | ORAL_TABLET | Freq: Two times a day (BID) | ORAL | 2 refills | Status: DC | PRN

## 2017-06-01 MED ORDER — VENLAFAXINE HCL ER 150 MG PO CP24: 150.0000 mg | ORAL_CAPSULE | Freq: Every day | ORAL | 2 refills | Status: DC

## 2017-06-01 MED ORDER — ZOLPIDEM TARTRATE 10 MG PO TABS: 10.0000 mg | ORAL_TABLET | Freq: Every evening | ORAL | 2 refills | Status: DC | PRN
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
## 2017-06-03 ENCOUNTER — Telehealth (HOSPITAL_COMMUNITY): Payer: Self-pay | Admitting: *Deleted

## 2017-06-03 NOTE — Telephone Encounter (Signed)
Left voice message, provider out of office 06/21/17.

## 2017-06-06 ENCOUNTER — Ambulatory Visit: Payer: Self-pay | Admitting: Family Medicine

## 2017-06-07 ENCOUNTER — Ambulatory Visit (INDEPENDENT_AMBULATORY_CARE_PROVIDER_SITE_OTHER): Payer: BC Managed Care – PPO | Admitting: Psychiatry

## 2017-06-07 ENCOUNTER — Encounter (HOSPITAL_COMMUNITY): Payer: Self-pay | Admitting: Psychiatry

## 2017-06-07 DIAGNOSIS — F411 Generalized anxiety disorder: Secondary | ICD-10-CM
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
## 2017-06-10 ENCOUNTER — Telehealth (HOSPITAL_COMMUNITY): Payer: Self-pay | Admitting: *Deleted

## 2017-06-10 NOTE — Telephone Encounter (Signed)
Office received P.A request for pt Zolpidem Tartrate 10 mg QHS. Pt pharmacy is [facility] in [place]. Informed will be sent to [facility]. Pt pharmacy number is [phone]. Pt insurance is BC[employer]. Per pt chart and [employer] number was not provided on paper that was faxed to office.

## 2017-06-13 ENCOUNTER — Ambulatory Visit (HOSPITAL_COMMUNITY): Payer: Self-pay | Admitting: Psychiatry

## 2017-06-21 ENCOUNTER — Ambulatory Visit (HOSPITAL_COMMUNITY): Payer: Self-pay | Admitting: Psychiatry

## 2017-06-22 ENCOUNTER — Ambulatory Visit: Payer: Self-pay | Admitting: Nutrition

## 2017-07-11 ENCOUNTER — Ambulatory Visit (HOSPITAL_COMMUNITY): Payer: Self-pay | Admitting: Psychiatry

## 2017-07-25 ENCOUNTER — Ambulatory Visit (HOSPITAL_COMMUNITY): Payer: Self-pay | Admitting: Psychiatry

## 2017-08-23 ENCOUNTER — Other Ambulatory Visit (HOSPITAL_COMMUNITY): Payer: Self-pay | Admitting: Psychiatry

## 2017-08-29 ENCOUNTER — Telehealth: Payer: Self-pay | Admitting: *Deleted

## 2017-08-29 NOTE — Telephone Encounter (Signed)
Patient called stating the amlodipine 2.8m has been recalled.

## 2017-08-29 NOTE — Telephone Encounter (Signed)
Amlodipine has not been recalled , I tried to conact her , no answer, the combination of amlodipine with other drugs is the issue

## 2017-08-30 NOTE — Telephone Encounter (Signed)
Patient aware.

## 2017-08-31 ENCOUNTER — Encounter (HOSPITAL_COMMUNITY): Payer: Self-pay | Admitting: Psychiatry

## 2017-08-31 ENCOUNTER — Ambulatory Visit (HOSPITAL_COMMUNITY): Payer: BC Managed Care – PPO | Admitting: Psychiatry

## 2017-08-31 VITALS — BP 140/84 | HR 91 | Ht 62.0 in | Wt 292.0 lb

## 2017-08-31 DIAGNOSIS — F332 Major depressive disorder, recurrent severe without psychotic features: Secondary | ICD-10-CM

## 2017-08-31 DIAGNOSIS — Z813 Family history of other psychoactive substance abuse and dependence: Secondary | ICD-10-CM

## 2017-08-31 DIAGNOSIS — Z818 Family history of other mental and behavioral disorders: Secondary | ICD-10-CM | POA: Diagnosis not present

## 2017-08-31 DIAGNOSIS — Z811 Family history of alcohol abuse and dependence: Secondary | ICD-10-CM | POA: Diagnosis not present

## 2017-08-31 DIAGNOSIS — F411 Generalized anxiety disorder: Secondary | ICD-10-CM

## 2017-08-31 MED ORDER — VENLAFAXINE HCL ER 150 MG PO CP24: 150.0000 mg | ORAL_CAPSULE | Freq: Every day | ORAL | 2 refills | Status: DC

## 2017-08-31 MED ORDER — ALPRAZOLAM 0.5 MG PO TABS: 0.5000 mg | ORAL_TABLET | Freq: Two times a day (BID) | ORAL | 2 refills | Status: DC | PRN
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
## 2017-09-07 ENCOUNTER — Ambulatory Visit: Payer: Self-pay | Admitting: Family Medicine

## 2017-09-09 ENCOUNTER — Ambulatory Visit (HOSPITAL_COMMUNITY): Payer: BC Managed Care – PPO | Admitting: Psychiatry

## 2017-09-09 ENCOUNTER — Encounter (HOSPITAL_COMMUNITY): Payer: Self-pay | Admitting: Psychiatry

## 2017-09-09 ENCOUNTER — Telehealth (INDEPENDENT_AMBULATORY_CARE_PROVIDER_SITE_OTHER): Payer: Self-pay | Admitting: *Deleted

## 2017-09-09 DIAGNOSIS — F411 Generalized anxiety disorder: Secondary | ICD-10-CM

## 2017-09-09 NOTE — Telephone Encounter (Signed)
Last TCS was 2012, when is patient due -- please advise
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
## 2017-09-11 NOTE — Telephone Encounter (Signed)
Last colonoscopy was in April 2012. 3 small polyps were removed and these were hyperplastic. Illness family history positive next colonoscopy in 2022. Please call patient.

## 2017-09-12 NOTE — Telephone Encounter (Signed)
Family history is positive for colon carcinoma in three maternal aunts, died at age 51, 75, and 64.  She does not have first-degree relative with CRC. -- will she need TCS now or 2022 -- please advise

## 2017-09-13 ENCOUNTER — Ambulatory Visit (INDEPENDENT_AMBULATORY_CARE_PROVIDER_SITE_OTHER): Payer: BC Managed Care – PPO | Admitting: Family Medicine

## 2017-09-13 ENCOUNTER — Encounter: Payer: Self-pay | Admitting: Family Medicine

## 2017-09-13 ENCOUNTER — Other Ambulatory Visit: Payer: Self-pay

## 2017-09-13 VITALS — BP 150/92 | HR 77 | Temp 98.5°F | Resp 16 | Ht 62.0 in | Wt 294.5 lb

## 2017-09-13 DIAGNOSIS — R945 Abnormal results of liver function studies: Secondary | ICD-10-CM

## 2017-09-13 DIAGNOSIS — F418 Other specified anxiety disorders: Secondary | ICD-10-CM

## 2017-09-13 DIAGNOSIS — R7303 Prediabetes: Secondary | ICD-10-CM

## 2017-09-13 DIAGNOSIS — I10 Essential (primary) hypertension: Secondary | ICD-10-CM | POA: Diagnosis not present

## 2017-09-13 DIAGNOSIS — Z021 Encounter for pre-employment examination: Secondary | ICD-10-CM | POA: Diagnosis not present

## 2017-09-13 DIAGNOSIS — E559 Vitamin D deficiency, unspecified: Secondary | ICD-10-CM

## 2017-09-13 DIAGNOSIS — R7989 Other specified abnormal findings of blood chemistry: Secondary | ICD-10-CM

## 2017-09-13 NOTE — Patient Instructions (Addendum)
F/u in 3.5 months, call if you need me before  Please call your insurance and find out what options are available , and all of them to help with weight loss, from gym membership, to weight loss support groups like weight watchers etc or personal coach   Vit D , cmp and EGFR non fast asap  It is important that you exercise regularly at least 30 minutes 7 times a week. If you develop chest pain, have severe difficulty breathing, or feel very tired, stop exercising immediately and seek medical attention   Continue therapy  Follow up on colonscopy  Eat 3 meals per day at set times and 90 %v  Sweet ids fruit  Water , 64 oz per day  Vegetables, 790 % of lunch and dinner  Nothing after 7 pm except fruit or vegetable    8 hours of sleep every day

## 2017-09-13 NOTE — Telephone Encounter (Signed)
She is deemed to be high risk and should get colonoscopy soon.

## 2017-09-14 NOTE — Telephone Encounter (Signed)
Left message on voicemail for patient to call and schedule

## 2017-09-15 ENCOUNTER — Telehealth: Payer: Self-pay

## 2017-09-15 ENCOUNTER — Encounter (INDEPENDENT_AMBULATORY_CARE_PROVIDER_SITE_OTHER): Payer: Self-pay | Admitting: *Deleted

## 2017-09-15 ENCOUNTER — Other Ambulatory Visit (INDEPENDENT_AMBULATORY_CARE_PROVIDER_SITE_OTHER): Payer: Self-pay | Admitting: *Deleted

## 2017-09-15 DIAGNOSIS — R7303 Prediabetes: Secondary | ICD-10-CM

## 2017-09-15 DIAGNOSIS — Z800 Family history of malignant neoplasm of digestive organs: Secondary | ICD-10-CM

## 2017-09-15 DIAGNOSIS — Z860100 Personal history of colon polyps, unspecified: Secondary | ICD-10-CM | POA: Insufficient documentation

## 2017-09-15 NOTE — Telephone Encounter (Signed)
TCS sch'd 11/23/16, patient aware, instructions given to patient

## 2017-09-15 NOTE — Telephone Encounter (Deleted)
Patient needs trilyte 

## 2017-09-15 NOTE — Telephone Encounter (Signed)
This encounter was created in error - please disregard.

## 2017-09-15 NOTE — Telephone Encounter (Signed)
Labs ordered.

## 2017-09-16 LAB — HEMOGLOBIN A1C
EAG (MMOL/L): 7.3 (calc)
HEMOGLOBIN A1C: 6.2 %{Hb} — AB (ref <5.7)
MEAN PLASMA GLUCOSE: 131 (calc)

## 2017-09-16 LAB — COMPLETE METABOLIC PANEL WITH GFR
AG Ratio: 0.8 (calc) — ABNORMAL LOW (ref 1.0–2.5)
ALT: 49 U/L — AB (ref 6–29)
AST: 98 U/L — ABNORMAL HIGH (ref 10–35)
Albumin: 3.2 g/dL — ABNORMAL LOW (ref 3.6–5.1)
Alkaline phosphatase (APISO): 137 U/L — ABNORMAL HIGH (ref 33–130)
BILIRUBIN TOTAL: 0.8 mg/dL (ref 0.2–1.2)
BUN/Creatinine Ratio: 9 (calc) (ref 6–22)
BUN: 6 mg/dL — AB (ref 7–25)
CALCIUM: 8.6 mg/dL (ref 8.6–10.4)
CHLORIDE: 110 mmol/L (ref 98–110)
CO2: 24 mmol/L (ref 20–32)
CREATININE: 0.65 mg/dL (ref 0.50–1.05)
GFR, EST AFRICAN AMERICAN: 119 mL/min/{1.73_m2} (ref >=60)
GFR, EST NON AFRICAN AMERICAN: 103 mL/min/{1.73_m2} (ref >=60)
GLUCOSE: 77 mg/dL (ref 65–99)
Globulin: 4.2 g/dL (calc) — ABNORMAL HIGH (ref 1.9–3.7)
Potassium: 3.7 mmol/L (ref 3.5–5.3)
Sodium: 140 mmol/L (ref 135–146)
TOTAL PROTEIN: 7.4 g/dL (ref 6.1–8.1)

## 2017-09-16 LAB — MEASLES/MUMPS/RUBELLA IMMUNITY
Mumps IgG: 75.2 AU/mL
RUBELLA: 6.02 {index}
Rubeola IgG: <25 AU/mL — ABNORMAL LOW

## 2017-09-16 LAB — VITAMIN D 25 HYDROXY (VIT D DEFICIENCY, FRACTURES): Vit D, 25-Hydroxy: 23 ng/mL — ABNORMAL LOW (ref 30–100)

## 2017-09-19 ENCOUNTER — Encounter: Payer: Self-pay | Admitting: Family Medicine

## 2017-09-19 MED ORDER — ERGOCALCIFEROL 1.25 MG (50000 UT) PO CAPS: 50000.0000 [IU] | ORAL_CAPSULE | ORAL | 1 refills | Status: DC

## 2017-09-19 MED ORDER — METOPROLOL TARTRATE 50 MG PO TABS: 50.0000 mg | ORAL_TABLET | Freq: Two times a day (BID) | ORAL | 1 refills | Status: DC

## 2017-09-19 MED ORDER — AMLODIPINE BESYLATE 2.5 MG PO TABS: 2.5000 mg | ORAL_TABLET | Freq: Every day | ORAL | 1 refills | Status: DC

## 2017-09-19 NOTE — Progress Notes (Signed)
Holly Alvarado     MRN: [id number]      DOB: [birth year]   HPI Holly Alvarado is here for follow up and re-evaluation of chronic medical conditions, medication management and review of any available recent lab and radiology data.  Preventive health is updated, specifically  Cancer screening and Immunization.   Questions or concerns regarding consultations or procedures which the PT has had in the interim are  addressed. The PT denies any adverse reactions to current medications since the last visit.  C/o ingoing weight gain, wants help Depressed and anxious about this.  Has once again started  Back in therapy which she NEEDS  ROS Denies recent fever or chills. Denies sinus pressure, nasal congestion, ear pain or sore throat. Denies chest congestion, productive cough or wheezing. Denies chest pains, palpitations and leg swelling Denies abdominal pain, nausea, vomiting,diarrhea or constipation.   Denies dysuria, frequency, hesitancy or incontinence. Denies joint pain, swelling and limitation in mobility. Denies headaches, seizures, numbness, or tingling. . Denies skin break down or rash.   PE  BP (!) 150/92 (BP Location: Left Arm, Patient Position: Sitting, Cuff Size: Normal)   Pulse 77   Temp 98.5 F (36.9 C) (Other (Comment))   Resp 16   Ht 5' 2"  (1.575 m)   Wt 294 lb 8 oz (133.6 kg)   SpO2 100%   BMI 53.86 kg/m   Patient alert and oriented and in no cardiopulmonary distress.  HEENT: No facial asymmetry, EOMI,   oropharynx pink and moist.  Neck supple no JVD, no mass.  Chest: Clear to auscultation bilaterally.  CVS: S1, S2 no murmurs, no S3.Regular rate.  ABD: Soft non tender.   Ext: No edema  MS: Decreased  ROM spine, shoulders, hips and knees.  Skin: Intact, no ulcerations or rash noted.  Psych: Good eye contact, normal affect. Memory intact not anxious or depressed appearing.  CNS: CN 2-12 intact, power,  normal throughout.no focal deficits  noted.   Assessment & Plan  Essential hypertension Uncontrolled, needs to work on lifestyle modification and weight loss, also medication compliance DASH diet and commitment to daily physical activity for a minimum of 30 minutes discussed and encouraged, as a part of hypertension management. The importance of attaining a healthy weight is also discussed.  BP/Weight 09/13/2017 05/03/2017 02/03/2017 06/11/2016 03/31/2016 1/88/4166 0/02/3015  Systolic BP 010 - - 932 355 732 -  Diastolic BP 92 - - 84 70 94 -  Wt. (Lbs) 294.5 289 291 261 252 252 245.5  BMI 53.86 52.86 53.22 47.74 46.08 46.08 44.89  Some encounter information is confidential and restricted. Go to Review Flowsheets activity to see all data.       Elevated LFTs Updated lab shows deterioration , needs to work aggressively on weight loss  Vitamin D deficiency updated lab show deficiency, needs to recommit to once weekly vitamin d, will need to contact patient  Morbid obesity ([place]) Deteriorated. Patient re-educated about  the importance of commitment to a  minimum of 150 minutes of exercise per week.  The importance of healthy food choices with portion control discussed. Encouraged to start a food diary, count calories and to consider  joining a support group. Sample diet sheets offered. Goals set by the patient for the next several months.   Weight /BMI 09/13/2017 05/03/2017 02/03/2017  WEIGHT 294 lb 8 oz 289 lb 291 lb  HEIGHT 5' 2"  5' 2"  5' 2"   BMI 53.86 kg/m2 52.86 kg/m2 53.22 kg/m2  Some encounter  information is confidential and restricted. Go to Review Flowsheets activity to see all data.  Pt to contact her insurance company for options available to her for assistance with weight loss including gym membership and support groups and get back in touch    Depression with anxiety Increased specially because of ongoing weight regain, has once again started back in therapy , which she desperately needs. She continues  medication management She is neither suicidal nor homicidal  Prediabetes Deteriorated  Patient educated about the importance of limiting  Carbohydrate intake , the need to commit to daily physical activity for a minimum of 30 minutes , and to commit weight loss. The fact that changes in all these areas will reduce or eliminate all together the development of diabetes is stressed.   Diabetic Labs Latest Ref Rng & Units 09/15/2017 02/01/2017 04/24/2015 10/03/2014 09/02/2014  HbA1c <5.7 % of total Hgb 6.2(H) - 5.6 - -  Chol <200 mg/dL - 129 106(L) 90 -  HDL >50 mg/dL - 28(L) 31(L) 19(L) -  Calc LDL <100 mg/dL - 79 58 52 -  Triglycerides <150 mg/dL - 110 84 94 -  Creatinine 0.50 - 1.05 mg/dL 0.65 0.60 0.79 0.65 0.70   BP/Weight 09/13/2017 05/03/2017 02/03/2017 06/11/2016 03/31/2016 3/83/2919 10/02/6058  Systolic BP 045 - - 997 741 423 -  Diastolic BP 92 - - 84 70 94 -  Wt. (Lbs) 294.5 289 291 261 252 252 245.5  BMI 53.86 52.86 53.22 47.74 46.08 46.08 44.89  Some encounter information is confidential and restricted. Go to Review Flowsheets activity to see all data.   No flowsheet data found.

## 2017-09-19 NOTE — Assessment & Plan Note (Signed)
Deteriorated  Patient educated about the importance of limiting  Carbohydrate intake , the need to commit to daily physical activity for a minimum of 30 minutes , and to commit weight loss. The fact that changes in all these areas will reduce or eliminate all together the development of diabetes is stressed.   Diabetic Labs Latest Ref Rng & Units 09/15/2017 02/01/2017 04/24/2015 10/03/2014 09/02/2014  HbA1c <5.7 % of total Hgb 6.2(H) - 5.6 - -  Chol <200 mg/dL - 129 106(L) 90 -  HDL >50 mg/dL - 28(L) 31(L) 19(L) -  Calc LDL <100 mg/dL - 79 58 52 -  Triglycerides <150 mg/dL - 110 84 94 -  Creatinine 0.50 - 1.05 mg/dL 0.65 0.60 0.79 0.65 0.70   BP/Weight 09/13/2017 05/03/2017 02/03/2017 06/11/2016 03/31/2016 0/76/2263 11/28/5454  Systolic BP 256 - - 389 373 428 -  Diastolic BP 92 - - 84 70 94 -  Wt. (Lbs) 294.5 289 291 261 252 252 245.5  BMI 53.86 52.86 53.22 47.74 46.08 46.08 44.89  Some encounter information is confidential and restricted. Go to Review Flowsheets activity to see all data.   No flowsheet data found.

## 2017-09-19 NOTE — Assessment & Plan Note (Signed)
Updated lab shows deterioration , needs to work aggressively on weight loss

## 2017-09-19 NOTE — Assessment & Plan Note (Signed)
Increased specially because of ongoing weight regain, has once again started back in therapy , which she desperately needs. She continues medication management She is neither suicidal nor homicidal

## 2017-09-19 NOTE — Assessment & Plan Note (Signed)
updated lab show deficiency, needs to recommit to once weekly vitamin d, will need to contact patient

## 2017-09-19 NOTE — Assessment & Plan Note (Signed)
Deteriorated. Patient re-educated about  the importance of commitment to a  minimum of 150 minutes of exercise per week.  The importance of healthy food choices with portion control discussed. Encouraged to start a food diary, count calories and to consider  joining a support group. Sample diet sheets offered. Goals set by the patient for the next several months.   Weight /BMI 09/13/2017 05/03/2017 02/03/2017  WEIGHT 294 lb 8 oz 289 lb 291 lb  HEIGHT 5' 2"  5' 2"  5' 2"   BMI 53.86 kg/m2 52.86 kg/m2 53.22 kg/m2  Some encounter information is confidential and restricted. Go to Review Flowsheets activity to see all data.  Pt to contact her insurance company for options available to her for assistance with weight loss including gym membership and support groups and get back in touch

## 2017-09-19 NOTE — Assessment & Plan Note (Addendum)
Uncontrolled, needs to work on lifestyle modification and weight loss, also medication compliance DASH diet and commitment to daily physical activity for a minimum of 30 minutes discussed and encouraged, as a part of hypertension management. The importance of attaining a healthy weight is also discussed.  BP/Weight 09/13/2017 05/03/2017 02/03/2017 06/11/2016 03/31/2016 0/06/9322 01/29/7321  Systolic BP 025 - - 427 062 376 -  Diastolic BP 92 - - 84 70 94 -  Wt. (Lbs) 294.5 289 291 261 252 252 245.5  BMI 53.86 52.86 53.22 47.74 46.08 46.08 44.89  Some encounter information is confidential and restricted. Go to Review Flowsheets activity to see all data.

## 2017-10-06 ENCOUNTER — Telehealth (INDEPENDENT_AMBULATORY_CARE_PROVIDER_SITE_OTHER): Payer: Self-pay | Admitting: *Deleted

## 2017-10-06 ENCOUNTER — Encounter (INDEPENDENT_AMBULATORY_CARE_PROVIDER_SITE_OTHER): Payer: Self-pay | Admitting: *Deleted

## 2017-10-06 MED ORDER — PEG 3350-KCL-NA BICARB-NACL 420 G PO SOLR: 4000.0000 mL | Freq: Once | ORAL | 0 refills | Status: AC

## 2017-10-06 NOTE — Telephone Encounter (Signed)
Patient needs trilyte 

## 2017-10-10 ENCOUNTER — Ambulatory Visit (INDEPENDENT_AMBULATORY_CARE_PROVIDER_SITE_OTHER): Payer: BC Managed Care – PPO | Admitting: Psychiatry

## 2017-10-10 DIAGNOSIS — F411 Generalized anxiety disorder: Secondary | ICD-10-CM
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
## 2017-10-11 ENCOUNTER — Encounter (HOSPITAL_COMMUNITY): Payer: Self-pay | Admitting: Psychiatry

## 2017-10-24 ENCOUNTER — Ambulatory Visit (HOSPITAL_COMMUNITY): Payer: Self-pay | Admitting: Psychiatry

## 2017-10-26 ENCOUNTER — Telehealth (INDEPENDENT_AMBULATORY_CARE_PROVIDER_SITE_OTHER): Payer: Self-pay | Admitting: *Deleted

## 2017-10-26 NOTE — Telephone Encounter (Signed)
agree

## 2017-10-26 NOTE — Telephone Encounter (Signed)
Referring MD/PCP: simpson   Procedure: tcs  Reason/Indication:  Hx polyps, fam hx colon ca  Has patient had this procedure before?  Yes, 2012  If so, when, by whom and where?    Is there a family history of colon cancer?  Yes aunts  Who?  What age when diagnosed?    Is patient diabetic?   Pre-diabetic      Does patient have prosthetic heart valve or mechanical valve?  no  Do you have a pacemaker?  no  Has patient ever had endocarditis? no  Has patient had joint replacement within last 12 months?  no  Is patient constipated or take laxatives? Yes, constipated  Does patient have a history of alcohol/drug use?  no  Is patient on Coumadin, Plavix and/or Aspirin? no  Medications: see epic  Allergies: see epic  Medication Adjustment per Dr Laural Golden:   Procedure date & time: 11/23/17 at 1130

## 2017-11-09 ENCOUNTER — Encounter (HOSPITAL_COMMUNITY): Payer: Self-pay | Admitting: Psychiatry

## 2017-11-09 ENCOUNTER — Ambulatory Visit (HOSPITAL_COMMUNITY): Payer: BC Managed Care – PPO | Admitting: Psychiatry

## 2017-11-09 DIAGNOSIS — F411 Generalized anxiety disorder: Secondary | ICD-10-CM
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
## 2017-11-23 ENCOUNTER — Other Ambulatory Visit: Payer: Self-pay

## 2017-11-23 ENCOUNTER — Encounter (HOSPITAL_COMMUNITY)
Admission: RE | Disposition: A | Discharge status: Home / Self Care | Payer: Self-pay | Source: Ambulatory Visit | Attending: Internal Medicine

## 2017-11-23 ENCOUNTER — Encounter (HOSPITAL_COMMUNITY): Payer: Self-pay | Admitting: *Deleted

## 2017-11-23 ENCOUNTER — Ambulatory Visit (HOSPITAL_COMMUNITY)
Admission: RE | Admit: 2017-11-23 | Discharge: 2017-11-23 | Disposition: A | Discharge status: Home / Self Care | Payer: BC Managed Care – PPO | Source: Ambulatory Visit | Attending: Internal Medicine | Admitting: Internal Medicine

## 2017-11-23 DIAGNOSIS — Z1211 Encounter for screening for malignant neoplasm of colon: Secondary | ICD-10-CM | POA: Diagnosis not present

## 2017-11-23 DIAGNOSIS — Z79899 Other long term (current) drug therapy: Secondary | ICD-10-CM | POA: Insufficient documentation

## 2017-11-23 DIAGNOSIS — Z882 Allergy status to sulfonamides status: Secondary | ICD-10-CM | POA: Insufficient documentation

## 2017-11-23 DIAGNOSIS — K6389 Other specified diseases of intestine: Secondary | ICD-10-CM | POA: Insufficient documentation

## 2017-11-23 DIAGNOSIS — I10 Essential (primary) hypertension: Secondary | ICD-10-CM | POA: Diagnosis not present

## 2017-11-23 DIAGNOSIS — K219 Gastro-esophageal reflux disease without esophagitis: Secondary | ICD-10-CM | POA: Insufficient documentation

## 2017-11-23 DIAGNOSIS — Z6841 Body Mass Index (BMI) 40.0 and over, adult: Secondary | ICD-10-CM | POA: Insufficient documentation

## 2017-11-23 DIAGNOSIS — K644 Residual hemorrhoidal skin tags: Secondary | ICD-10-CM | POA: Insufficient documentation

## 2017-11-23 DIAGNOSIS — E669 Obesity, unspecified: Secondary | ICD-10-CM | POA: Diagnosis not present

## 2017-11-23 DIAGNOSIS — Z888 Allergy status to other drugs, medicaments and biological substances status: Secondary | ICD-10-CM | POA: Diagnosis not present

## 2017-11-23 DIAGNOSIS — F410 Panic disorder [episodic paroxysmal anxiety] without agoraphobia: Secondary | ICD-10-CM | POA: Insufficient documentation

## 2017-11-23 DIAGNOSIS — Z885 Allergy status to narcotic agent status: Secondary | ICD-10-CM | POA: Diagnosis not present

## 2017-11-23 DIAGNOSIS — Z800 Family history of malignant neoplasm of digestive organs: Secondary | ICD-10-CM

## 2017-11-23 DIAGNOSIS — Z860100 Personal history of colon polyps, unspecified: Secondary | ICD-10-CM

## 2017-11-23 HISTORY — PX: COLONOSCOPY: SHX5424

## 2017-11-23 SURGERY — COLONOSCOPY
Anesthesia: Moderate Sedation

## 2017-11-23 MED ORDER — SODIUM CHLORIDE 0.9 % IV SOLN
INTRAVENOUS | Status: DC
  Administered 2017-11-23: 11:00:00 via INTRAVENOUS

## 2017-11-23 MED ORDER — STERILE WATER FOR IRRIGATION IR SOLN
Status: DC | PRN
  Administered 2017-11-23: 13:00:00

## 2017-11-23 MED ORDER — MIDAZOLAM HCL 5 MG/5ML IJ SOLN
INTRAMUSCULAR | Status: AC
  Filled 2017-11-23: qty 10

## 2017-11-23 MED ORDER — MEPERIDINE HCL 50 MG/ML IJ SOLN
INTRAMUSCULAR | Status: DC | PRN
  Administered 2017-11-23 (×2): 25 mg

## 2017-11-23 MED ORDER — MEPERIDINE HCL 50 MG/ML IJ SOLN
INTRAMUSCULAR | Status: AC
  Filled 2017-11-23: qty 1

## 2017-11-23 MED ORDER — MIDAZOLAM HCL 5 MG/5ML IJ SOLN
INTRAMUSCULAR | Status: DC | PRN
  Administered 2017-11-23 (×5): 2 mg via INTRAVENOUS

## 2017-11-23 NOTE — Progress Notes (Signed)
Holly Alvarado may not drive, operate heavy machinery, or sign legal documents until after 1:00pm on Thursday February 28th, 2019.

## 2017-11-23 NOTE — H&P (Signed)
Holly Alvarado is an 52 y.o. female.   Chief Complaint: Patient is here for colonoscopy. HPI: Patient is 52 year old African female who is here for screening colonoscopy.  Last exam was in 2012 with removal of small polyp and was hyperplastic.  She denies abdominal pain change in bowel habits or rectal bleeding. History significant for CRC and 6 maternal aunts.  They are all in their 60s or 70s at the time of diagnosis.  Past Medical History:  Diagnosis Date  . Allergic rhinitis, seasonal   . Anemia    hx of   . Anxiety    hx of panic attacks and panic attacks with surgery   . Anxiety disorder   . Chronic constipation   . Complication of anesthesia    woke up during surgery of tubal ligation   . Esophageal polyp   . Fatty liver   . GERD (gastroesophageal reflux disease)   . Hemorrhoids   . Hypertension   . Obesity     Past Surgical History:  Procedure Laterality Date  . BLT  1987  . BREAST BIOPSY Right 2010   benign  . BREATH TEK H PYLORI N/A 02/25/2014   Procedure: BREATH TEK H PYLORI;  Surgeon: Pedro Earls, MD;  Location: [facility];  Service: General;  Laterality: N/A;  . CHOLECYSTECTOMY  1997  . COLONOSCOPY  12/30/2010  . [facility]  . 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 MOUTH SURGERY     wisdom tooth extraction  . PARTIAL HYSTERECTOMY    . TOTAL ABDOMINAL HYSTERECTOMY  03/11/08   for fibroids   . TUBAL LIGATION    . UPPER GASTROINTESTINAL ENDOSCOPY  12/30/2010  . UPPER GASTROINTESTINAL ENDOSCOPY  05/11/04  . UPPER GASTROINTESTINAL ENDOSCOPY  04/30/97   ANWAR    Family History  Problem Relation Age of Onset  . Hypertension Mother   . COPD Mother   . Depression Mother   . Hyperlipidemia Father   . Hypertension Father   . Depression Father   . Alcohol abuse Father   .  Drug abuse Father   . Hyperlipidemia Sister   . Hypertension Sister   . Hyperlipidemia Sister   . Hypertension Sister   . Diabetes Unknown        family history   . Asthma Unknown        family history   . Arthritis Unknown        family history   . Stroke Other   . Cancer Other   . Heart disease Other   . Schizophrenia Maternal Uncle   . Colon cancer Maternal Aunt   . Colon cancer Maternal Aunt   . Colon cancer Maternal Aunt   . Colon cancer Maternal Aunt   . Colon cancer Maternal Aunt   . Colon cancer Maternal Aunt    Social History:  reports that  has never smoked. she has never used smokeless tobacco. She reports that she does not drink alcohol or use drugs.  Allergies:  Allergies  Allergen Reactions  . Codeine     Severe headache.   . Sulfonamide Derivatives Hives and Itching  . Tape     Foam tape - caused blisters and sever itching    Medications Prior to Admission  Medication Sig Dispense Refill  . ALPRAZolam (XANAX) 0.5 MG tablet  Take 1 tablet (0.5 mg total) by mouth 2 (two) times daily as needed for anxiety. 60 tablet 2  . amLODipine (NORVASC) 2.5 MG tablet Take 1 tablet (2.5 mg total) by mouth daily. 90 tablet 1  . Calcium Carb-Cholecalciferol (CALTRATE 600+D3 SOFT) 600-800 MG-UNIT CHEW Chew 1 Piece by mouth daily.    . diphenhydrAMINE (BENADRYL) 50 MG capsule Take 50 mg by mouth as needed. Patient states that she will take 25 to 50 mg depending upon symptoms.    . ergocalciferol (VITAMIN D2) 50000 units capsule Take 1 capsule (50,000 Units total) by mouth once a week. One capsule once weekly (Patient taking differently: Take 50,000 Units by mouth once a week. On Monday) 12 capsule 1  . esomeprazole (NEXIUM) 40 MG capsule Take 1 capsule (40 mg total) by mouth daily. 30 capsule 3  . metoprolol tartrate (LOPRESSOR) 50 MG tablet Take 1 tablet (50 mg total) by mouth 2 (two) times daily. 180 tablet 1  . Multiple Vitamin (MULTIVITAMIN) capsule Take 2 capsules by mouth  daily.     Marland Kitchen venlafaxine XR (EFFEXOR-XR) 150 MG 24 hr capsule Take 1 capsule (150 mg total) by mouth daily. 30 capsule 2  . fluticasone (FLONASE) 50 MCG/ACT nasal spray Place 2 sprays into both nostrils daily as needed for allergies or rhinitis.    Marland Kitchen loratadine (CLARITIN) 10 MG tablet Take 10 mg by mouth daily as needed for allergies.       No results found for this or any previous visit (from the past 48 hour(s)). No results found.  ROS  Blood pressure (!) 155/90, pulse 61, temperature 98.1 F (36.7 C), temperature source Oral, resp. rate 18, height 5' 2"  (1.575 m), weight 288 lb (130.6 kg), SpO2 100 %. Physical Exam  Constitutional: She appears well-developed and well-nourished.  HENT:  Mouth/Throat: Oropharynx is clear and moist.  Eyes: Conjunctivae are normal. No scleral icterus.  Neck: No thyromegaly present.  Cardiovascular: Normal rate, regular rhythm and normal heart sounds.  No murmur heard. Respiratory: Effort normal and breath sounds normal.  GI:  Abdomen is obese.  Soft and nontender with organomegaly or masses.  Musculoskeletal: She exhibits no edema.  Lymphadenopathy:    She has no cervical adenopathy.  Neurological: She is alert.  Skin: Skin is warm and dry.     Assessment/Plan High risk screening colonoscopy. Multiple second-degree relatives with colorectal carcinoma.  Hildred Laser, MD 11/23/2017, 12:12 PM

## 2017-11-23 NOTE — Discharge Instructions (Signed)
Resume usual medications as before. Resume usual diet. No driving for 24 hours. Next colonoscopy in 5 years.       Colonoscopy, Adult, Care After This sheet gives you information about how to care for yourself after your procedure. Your doctor may also give you more specific instructions. If you have problems or questions, call your doctor. Follow these instructions at home: General instructions   For the first 24 hours after the procedure: ? Do not drive or use machinery. ? Do not sign important documents. ? Do not drink alcohol. ? Do your daily activities more slowly than normal. ? Eat foods that are soft and easy to digest. ? Rest often.  Take over-the-counter or prescription medicines only as told by your doctor.  It is up to you to get the results of your procedure. Ask your doctor, or the department performing the procedure, when your results will be ready. To help cramping and bloating:  Try walking around.  Put heat on your belly (abdomen) as told by your doctor. Use a heat source that your doctor recommends, such as a moist heat pack or a heating pad. ? Put a towel between your skin and the heat source. ? Leave the heat on for 20-30 minutes. ? Remove the heat if your skin turns bright red. This is especially important if you cannot feel pain, heat, or cold. You can get burned. Eating and drinking  Drink enough fluid to keep your pee (urine) clear or pale yellow.  Return to your normal diet as told by your doctor. Avoid heavy or fried foods that are hard to digest.  Avoid drinking alcohol for as long as told by your doctor. Contact a doctor if:  You have blood in your poop (stool) 2-3 days after the procedure. Get help right away if:  You have more than a small amount of blood in your poop.  You see large clumps of tissue (blood clots) in your poop.  Your belly is swollen.  You feel sick to your stomach (nauseous).  You throw up (vomit).  You have a  fever.  You have belly pain that gets worse, and medicine does not help your pain. This information is not intended to replace advice given to you by your health care provider. Make sure you discuss any questions you have with your health care provider. Document Released: 10/16/2010 Document Revised: 06/07/2016 Document Reviewed: 06/07/2016 Elsevier Interactive Patient Education  2017 [employer]     Hemorrhoids Hemorrhoids are swollen veins in and around the rectum or anus. There are two types of hemorrhoids:  Internal hemorrhoids. These occur in the veins that are just inside the rectum. They may poke through to the outside and become irritated and painful.  External hemorrhoids. These occur in the veins that are outside of the anus and can be felt as a painful swelling or hard lump near the anus.  Most hemorrhoids do not cause serious problems, and they can be managed with home treatments such as diet and lifestyle changes. If home treatments do not help your symptoms, procedures can be done to shrink or remove the hemorrhoids. What are the causes? This condition is caused by increased pressure in the anal area. This pressure may result from various things, including:  Constipation.  Straining to have a bowel movement.  Diarrhea.  Pregnancy.  Obesity.  Sitting for long periods of time.  Heavy lifting or other activity that causes you to strain.  Anal sex.  What are  the signs or symptoms? Symptoms of this condition include:  Pain.  Anal itching or irritation.  Rectal bleeding.  Leakage of stool (feces).  Anal swelling.  One or more lumps around the anus.  How is this diagnosed? This condition can often be diagnosed through a visual exam. Other exams or tests may also be done, such as:  Examination of the rectal area with a gloved hand (digital rectal exam).  Examination of the anal canal using a small tube (anoscope).  A blood test, if you have lost a  significant amount of blood.  A test to look inside the colon (sigmoidoscopy or colonoscopy).  How is this treated? This condition can usually be treated at home. However, various procedures may be done if dietary changes, lifestyle changes, and other home treatments do not help your symptoms. These procedures can help make the hemorrhoids smaller or remove them completely. Some of these procedures involve surgery, and others do not. Common procedures include:  Rubber band ligation. Rubber bands are placed at the base of the hemorrhoids to cut off the blood supply to them.  Sclerotherapy. Medicine is injected into the hemorrhoids to shrink them.  Infrared coagulation. A type of light energy is used to get rid of the hemorrhoids.  Hemorrhoidectomy surgery. The hemorrhoids are surgically removed, and the veins that supply them are tied off.  Stapled hemorrhoidopexy surgery. A circular stapling device is used to remove the hemorrhoids and use staples to cut off the blood supply to them.  Follow these instructions at home: Eating and drinking  Eat foods that have a lot of fiber in them, such as whole grains, beans, nuts, fruits, and vegetables. Ask your health care provider about taking products that have added fiber (fiber supplements).  Drink enough fluid to keep your urine clear or pale yellow. Managing pain and swelling  Take warm sitz baths for 20 minutes, 3-4 times a day to ease pain and discomfort.  If directed, apply ice to the affected area. Using ice packs between sitz baths may be helpful. ? Put ice in a plastic bag. ? Place a towel between your skin and the bag. ? Leave the ice on for 20 minutes, 2-3 times a day. General instructions  Take over-the-counter and prescription medicines only as told by your health care provider.  Use medicated creams or suppositories as told.  Exercise regularly.  Go to the bathroom when you have the urge to have a bowel movement. Do not  wait.  Avoid straining to have bowel movements.  Keep the anal area dry and clean. Use wet toilet paper or moist towelettes after a bowel movement.  Do not sit on the toilet for long periods of time. This increases blood pooling and pain. Contact a health care provider if:  You have increasing pain and swelling that are not controlled by treatment or medicine.  You have uncontrolled bleeding.  You have difficulty having a bowel movement, or you are unable to have a bowel movement.  You have pain or inflammation outside the area of the hemorrhoids. This information is not intended to replace advice given to you by your health care provider. Make sure you discuss any questions you have with your health care provider. Document Released: 09/10/2000 Document Revised: 02/11/2016 Document Reviewed: 05/28/2015 Elsevier Interactive Patient Education  [employer].

## 2017-11-23 NOTE — Op Note (Signed)
[facility] Patient Name: Holly Alvarado Procedure Date: 11/23/2017 12:15 PM MRN: [id number] Date of Birth: 09-Apr-1966 Attending MD: Hildred Laser , MD CSN: [id number] Age: 52 Admit Type: Outpatient Procedure:                Colonoscopy Indications:              Colon cancer screening in patient at increased                            risk: Family history of colorectal cancer in                            multiple 2nd degree relatives Providers:                Hildred Laser, MD, Janeece Riggers, RN, Nelma Rothman,                            Technician Referring MD:             Norwood Levo. Moshe Cipro, MD Medicines:                Meperidine 50 mg IV, Midazolam 10 mg IV Complications:            No immediate complications. Estimated Blood Loss:     Estimated blood loss: none. Procedure:                Pre-Anesthesia Assessment:                           - Prior to the procedure, a History and Physical                            was performed, and patient medications and                            allergies were reviewed. The patient's tolerance of                            previous anesthesia was also reviewed. The risks                            and benefits of the procedure and the sedation                            options and risks were discussed with the patient.                            All questions were answered, and informed consent                            was obtained. Prior Anticoagulants: The patient has                            taken no previous anticoagulant or antiplatelet  agents. ASA Grade Assessment: II - A patient with                            mild systemic disease. After reviewing the risks                            and benefits, the patient was deemed in                            satisfactory condition to undergo the procedure.                           After obtaining informed consent, the colonoscope                            was  passed under direct vision. Throughout the                            procedure, the patient's blood pressure, pulse, and                            oxygen saturations were monitored continuously. The                            [id number] ([id number]) scope was introduced through                            the anus and advanced to the the cecum, identified                            by appendiceal orifice and ileocecal valve. The                            colonoscopy was performed without difficulty. The                            patient tolerated the procedure well. The quality                            of the bowel preparation was good. The ileocecal                            valve, appendiceal orifice, and rectum were                            photographed. Scope In: 12:21:36 PM Scope Out: 12:42:32 PM Scope Withdrawal Time: 0 hours 10 minutes 19 seconds  Total Procedure Duration: 0 hours 20 minutes 56 seconds  Findings:      The perianal and digital rectal examinations were normal.      An area of mild melanosis was found in the sigmoid colon.      The exam was otherwise normal throughout the examined colon.      External hemorrhoids were found during retroflexion. The hemorrhoids  were small. Impression:               - Melanosis in the colon.                           - External hemorrhoids.                           - No specimens collected. Moderate Sedation:      Moderate (conscious) sedation was administered by the endoscopy nurse       and supervised by the endoscopist. The following parameters were       monitored: oxygen saturation, heart rate, blood pressure, CO2       capnography and response to care. Total physician intraservice time was       26 minutes. Recommendation:           - Patient has a contact number available for                            emergencies. The signs and symptoms of potential                            delayed complications were discussed  with the                            patient. Return to normal activities tomorrow.                            Written discharge instructions were provided to the                            patient.                           - Resume previous diet today.                           - Continue present medications.                           - Repeat colonoscopy in 5 years for screening                            purposes. Procedure Code(s):        --- Professional ---                           [id number], Colonoscopy, flexible; diagnostic, including                            collection of specimen(s) by brushing or washing,                            when performed (separate procedure)                           99152, Moderate sedation services provided by the  same physician or other qualified health care                            professional performing the diagnostic or                            therapeutic service that the sedation supports,                            requiring the presence of an independent trained                            observer to assist in the monitoring of the                            patient's level of consciousness and physiological                            status; initial 15 minutes of intraservice time,                            patient age 62 years or older                           [phone], Moderate sedation services; each additional                            15 minutes intraservice time Diagnosis Code(s):        --- Professional ---                           Z80.0, Family history of malignant neoplasm of                            digestive organs                           K63.89, Other specified diseases of intestine                           K64.4, Residual hemorrhoidal skin tags CPT copyright 2016 American Medical Association. All rights reserved. The codes documented in this report are preliminary and upon coder review may   be revised to meet current compliance requirements. Hildred Laser, MD Hildred Laser, MD 11/23/2017 12:53:22 PM This report has been signed electronically. Number of Addenda: 0

## 2017-11-25 ENCOUNTER — Encounter (HOSPITAL_COMMUNITY): Payer: Self-pay | Admitting: Internal Medicine

## 2017-11-29 ENCOUNTER — Telehealth (INDEPENDENT_AMBULATORY_CARE_PROVIDER_SITE_OTHER): Payer: Self-pay | Admitting: *Deleted

## 2017-11-29 ENCOUNTER — Ambulatory Visit (HOSPITAL_COMMUNITY): Payer: BC Managed Care – PPO | Admitting: Psychiatry

## 2017-11-29 ENCOUNTER — Encounter (HOSPITAL_COMMUNITY): Payer: Self-pay | Admitting: Psychiatry

## 2017-11-29 VITALS — BP 106/63 | HR 70 | Ht 62.0 in | Wt 297.0 lb

## 2017-11-29 DIAGNOSIS — Z811 Family history of alcohol abuse and dependence: Secondary | ICD-10-CM

## 2017-11-29 DIAGNOSIS — Z818 Family history of other mental and behavioral disorders: Secondary | ICD-10-CM | POA: Diagnosis not present

## 2017-11-29 DIAGNOSIS — F411 Generalized anxiety disorder: Secondary | ICD-10-CM | POA: Diagnosis not present

## 2017-11-29 DIAGNOSIS — G4700 Insomnia, unspecified: Secondary | ICD-10-CM | POA: Diagnosis not present

## 2017-11-29 DIAGNOSIS — Z62811 Personal history of psychological abuse in childhood: Secondary | ICD-10-CM | POA: Diagnosis not present

## 2017-11-29 DIAGNOSIS — Z813 Family history of other psychoactive substance abuse and dependence: Secondary | ICD-10-CM | POA: Diagnosis not present

## 2017-11-29 MED ORDER — ALPRAZOLAM 0.5 MG PO TABS: 0.5000 mg | ORAL_TABLET | Freq: Two times a day (BID) | ORAL | 2 refills | Status: DC | PRN

## 2017-11-29 MED ORDER — TRAZODONE HCL 50 MG PO TABS: 50.0000 mg | ORAL_TABLET | Freq: Every day | ORAL | 2 refills | Status: DC

## 2017-11-29 MED ORDER — VENLAFAXINE HCL ER 150 MG PO CP24: 150.0000 mg | ORAL_CAPSULE | Freq: Every day | ORAL | 2 refills | Status: DC

## 2017-11-29 NOTE — Telephone Encounter (Signed)
Patient called requesting to talk with Dr Laural Golden she has questions regarding the colonoscopy she had done. [phone]
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
## 2017-11-30 ENCOUNTER — Ambulatory Visit (HOSPITAL_COMMUNITY): Payer: Self-pay | Admitting: Psychiatry

## 2017-12-01 NOTE — Telephone Encounter (Signed)
Patient was called and a message was left asking that she call us back so that we may get the questions that she may have about the Colonoscopy she had done.

## 2017-12-14 ENCOUNTER — Ambulatory Visit (HOSPITAL_COMMUNITY): Payer: BC Managed Care – PPO | Admitting: Psychiatry

## 2017-12-14 DIAGNOSIS — F411 Generalized anxiety disorder: Secondary | ICD-10-CM | POA: Diagnosis not present
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
## 2017-12-15 ENCOUNTER — Encounter (HOSPITAL_COMMUNITY): Payer: Self-pay | Admitting: Psychiatry

## 2017-12-22 ENCOUNTER — Ambulatory Visit: Payer: Self-pay | Admitting: Family Medicine

## 2017-12-26 ENCOUNTER — Ambulatory Visit (INDEPENDENT_AMBULATORY_CARE_PROVIDER_SITE_OTHER): Payer: BC Managed Care – PPO | Admitting: Psychiatry

## 2017-12-26 ENCOUNTER — Encounter (HOSPITAL_COMMUNITY): Payer: Self-pay | Admitting: Psychiatry

## 2017-12-26 DIAGNOSIS — F411 Generalized anxiety disorder: Secondary | ICD-10-CM | POA: Diagnosis not present
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
## 2018-01-09 ENCOUNTER — Ambulatory Visit (HOSPITAL_COMMUNITY): Payer: BC Managed Care – PPO | Admitting: Psychiatry

## 2018-01-09 ENCOUNTER — Encounter (HOSPITAL_COMMUNITY): Payer: Self-pay | Admitting: Psychiatry

## 2018-01-09 DIAGNOSIS — F411 Generalized anxiety disorder: Secondary | ICD-10-CM | POA: Diagnosis not present
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
## 2018-01-31 ENCOUNTER — Encounter (HOSPITAL_COMMUNITY): Payer: Self-pay | Admitting: Psychiatry

## 2018-01-31 ENCOUNTER — Ambulatory Visit (HOSPITAL_COMMUNITY): Payer: BC Managed Care – PPO | Admitting: Psychiatry

## 2018-01-31 VITALS — BP 137/69 | HR 72 | Ht 62.0 in | Wt 295.0 lb

## 2018-01-31 DIAGNOSIS — Z813 Family history of other psychoactive substance abuse and dependence: Secondary | ICD-10-CM

## 2018-01-31 DIAGNOSIS — F411 Generalized anxiety disorder: Secondary | ICD-10-CM

## 2018-01-31 DIAGNOSIS — Z62811 Personal history of psychological abuse in childhood: Secondary | ICD-10-CM

## 2018-01-31 DIAGNOSIS — Z811 Family history of alcohol abuse and dependence: Secondary | ICD-10-CM

## 2018-01-31 DIAGNOSIS — Z818 Family history of other mental and behavioral disorders: Secondary | ICD-10-CM

## 2018-01-31 MED ORDER — VENLAFAXINE HCL ER 150 MG PO CP24: 150.0000 mg | ORAL_CAPSULE | Freq: Every day | ORAL | 2 refills | Status: DC

## 2018-01-31 MED ORDER — ALPRAZOLAM 0.5 MG PO TABS: 0.5000 mg | ORAL_TABLET | Freq: Two times a day (BID) | ORAL | 2 refills | Status: DC | PRN

## 2018-01-31 MED ORDER — TRAZODONE HCL 50 MG PO TABS: 50.0000 mg | ORAL_TABLET | Freq: Every day | ORAL | 2 refills | Status: DC
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
## 2018-03-01 ENCOUNTER — Ambulatory Visit: Payer: BC Managed Care – PPO | Admitting: Family Medicine

## 2018-03-01 ENCOUNTER — Encounter: Payer: Self-pay | Admitting: Family Medicine

## 2018-03-01 VITALS — BP 122/80 | HR 84 | Resp 16 | Ht 62.0 in | Wt 295.0 lb

## 2018-03-01 DIAGNOSIS — J3089 Other allergic rhinitis: Secondary | ICD-10-CM | POA: Diagnosis not present

## 2018-03-01 DIAGNOSIS — F418 Other specified anxiety disorders: Secondary | ICD-10-CM | POA: Diagnosis not present

## 2018-03-01 DIAGNOSIS — I10 Essential (primary) hypertension: Secondary | ICD-10-CM | POA: Diagnosis not present

## 2018-03-01 MED ORDER — AMLODIPINE BESYLATE 2.5 MG PO TABS: 2.5000 mg | ORAL_TABLET | Freq: Every day | ORAL | 1 refills | Status: DC

## 2018-03-01 NOTE — Progress Notes (Signed)
   KELSYE LOOMER     MRN: [id number]      DOB: 15-Mar-1966   HPI Holly Alvarado is here for follow up and re-evaluation of chronic medical conditions, medication management and review of any available recent lab and radiology data.  Preventive health is updated, specifically  Cancer screening and Immunization.  Recent pap was abnorml She has been faithfully going to the psychiatrist and therapist and has made progress Unfortunately her weight is going in the wrong direction so she needs to return to her bariatric surgeon and also a nutritionist she has been doing some exercise and actually recently fell an grazed her right knee   ROS Denies recent fever or chills. Denies sinus pressure, nasal congestion, ear pain or sore throat. Denies chest congestion, productive cough or wheezing. Denies chest pains, palpitations and leg swelling Denies abdominal pain, nausea, vomiting,diarrhea or constipation.   Denies dysuria, frequency, hesitancy or incontinence. Denies joint pain, swelling and limitation in mobility. Denies headaches, seizures, numbness, or tingling. Denies uncontrolled  depression, anxiety or insomnia. Denies skin break down or rash.   PE  BP 122/80   Pulse 84   Resp 16   Ht 5' 2"  (1.575 m)   Wt 295 lb (133.8 kg)   SpO2 98%   BMI 53.96 kg/m   Patient alert and oriented and in no cardiopulmonary distress.  HEENT: No facial asymmetry, EOMI,   oropharynx pink and moist.  Neck supple no JVD, no mass.  Chest: Clear to auscultation bilaterally.  CVS: S1, S2 no murmurs, no S3.Regular rate.  ABD: Soft non tender.   Ext: No edema  MS: Adequate ROM spine, shoulders, hips and reduced in  knees.  Skin: Intact, no ulcerations or rash noted.  Psych: Good eye contact, normal affect. Memory intact not anxious or depressed appearing.  CNS: CN 2-12 intact, power,  normal throughout.no focal deficits noted.   Assessment & Plan Essential hypertension Controlled, no change in  medication DASH diet and commitment to daily physical activity for a minimum of 30 minutes discussed and encouraged, as a part of hypertension management. The importance of attaining a healthy weight is also discussed.  BP/Weight 03/01/2018 11/23/2017 09/13/2017 05/03/2017 02/03/2017 05/25/9370 03/05/6788  Systolic BP 381 017 510 - - 258 527  Diastolic BP 80 64 92 - - 84 70  Wt. (Lbs) 295 288 294.5 289 291 261 252  BMI 53.96 52.68 53.86 52.86 53.22 47.74 46.08  Some encounter information is confidential and restricted. Go to Review Flowsheets activity to see all data.       Morbid obesity ([place]) Deteriorated. Patient re-educated about  the importance of commitment to a  minimum of 150 minutes of exercise per week.  The importance of healthy food choices with portion control discussed. Encouraged to start a food diary, count calories and to consider  joining a support group. Sample diet sheets offered. Goals set by the patient for the next several months.   Weight /BMI 03/01/2018 11/23/2017 09/13/2017  WEIGHT 295 lb 288 lb 294 lb 8 oz  HEIGHT 5' 2"  5' 2"  5' 2"   BMI 53.96 kg/m2 52.68 kg/m2 53.86 kg/m2  Some encounter information is confidential and restricted. Go to Review Flowsheets activity to see all data.    Refer to nutrionist and to bariatric surgeon  Depression with anxiety Marked improvement with psychiatric care , continue same  Allergic rhinitis Controlled, no change in medication

## 2018-03-01 NOTE — Patient Instructions (Signed)
F/U in 5.5 months, call if you need me before  Fasting lipid, cmp and EGFR, HBA1C, TSH , vit D, CBC  Within  The next week  Please schedule your mammogram due August 3 or after   You are being referred to Dr Hassell Done re weight loss surgery foir f/u in [facility] are referred to Local nutritionist  I am VERY PROUD of you and thankful that you have embraced mental health needs , you have made a lot of progress  Please work on good  health habits so that your health will improve. 1. Commitment to daily physical activity for 30 to 60  minutes, if you are able to do this.  2. Commitment to wise food choices. Aim for half of your  food intake to be vegetable and fruit, one quarter starchy foods, and one quarter protein. Try to eat on a regular schedule  3 meals per day, snacking between meals should be limited to vegetables or fruits or small portions of nuts. 64 ounces of water per day is generally recommended, unless you have specific health conditions, like heart failure or kidney failure where you will need to limit fluid intake.  3. Commitment to sufficient and a  good quality of physical and mental rest daily, generally between 6 to 8 hours per day.  WITH PERSISTANCE AND PERSEVERANCE, THE IMPOSSIBLE , BECOMES THE NORM! It is important that you exercise regularly at least 30 minutes 5 times a week. If you develop chest pain, have severe difficulty breathing, or feel very tired, stop exercising immediately and seek medical attention  Thank you  for choosing  Primary Care. We consider it a privelige to serve you.  Delivering excellent health care in a caring and  compassionate way is our goal.  Partnering with you,  so that together we can achieve this goal is our strategy.

## 2018-03-05 ENCOUNTER — Encounter: Payer: Self-pay | Admitting: Family Medicine

## 2018-03-05 NOTE — Assessment & Plan Note (Signed)
Controlled, no change in medication  

## 2018-03-05 NOTE — Assessment & Plan Note (Signed)
Deteriorated. Patient re-educated about  the importance of commitment to a  minimum of 150 minutes of exercise per week.  The importance of healthy food choices with portion control discussed. Encouraged to start a food diary, count calories and to consider  joining a support group. Sample diet sheets offered. Goals set by the patient for the next several months.   Weight /BMI 03/01/2018 11/23/2017 09/13/2017  WEIGHT 295 lb 288 lb 294 lb 8 oz  HEIGHT 5' 2"  5' 2"  5' 2"   BMI 53.96 kg/m2 52.68 kg/m2 53.86 kg/m2  Some encounter information is confidential and restricted. Go to Review Flowsheets activity to see all data.    Refer to nutrionist and to bariatric surgeon

## 2018-03-05 NOTE — Assessment & Plan Note (Signed)
Controlled, no change in medication DASH diet and commitment to daily physical activity for a minimum of 30 minutes discussed and encouraged, as a part of hypertension management. The importance of attaining a healthy weight is also discussed.  BP/Weight 03/01/2018 11/23/2017 09/13/2017 05/03/2017 02/03/2017 2/89/7915 0/12/1362  Systolic BP 383 779 396 - - 886 484  Diastolic BP 80 64 92 - - 84 70  Wt. (Lbs) 295 288 294.5 289 291 261 252  BMI 53.96 52.68 53.86 52.86 53.22 47.74 46.08  Some encounter information is confidential and restricted. Go to Review Flowsheets activity to see all data.

## 2018-03-05 NOTE — Assessment & Plan Note (Signed)
Marked improvement with psychiatric care , continue same

## 2018-03-06 ENCOUNTER — Telehealth: Payer: Self-pay

## 2018-03-06 DIAGNOSIS — E559 Vitamin D deficiency, unspecified: Secondary | ICD-10-CM

## 2018-03-06 DIAGNOSIS — I10 Essential (primary) hypertension: Secondary | ICD-10-CM

## 2018-03-06 DIAGNOSIS — R7303 Prediabetes: Secondary | ICD-10-CM

## 2018-03-06 NOTE — Telephone Encounter (Signed)
Labs ordered.

## 2018-03-07 ENCOUNTER — Ambulatory Visit (HOSPITAL_COMMUNITY): Payer: BC Managed Care – PPO | Admitting: Psychiatry

## 2018-03-08 ENCOUNTER — Other Ambulatory Visit: Payer: Self-pay | Admitting: Family Medicine

## 2018-03-08 ENCOUNTER — Telehealth: Payer: Self-pay | Admitting: Family Medicine

## 2018-03-08 ENCOUNTER — Telehealth: Payer: Self-pay

## 2018-03-08 DIAGNOSIS — R7303 Prediabetes: Secondary | ICD-10-CM

## 2018-03-08 NOTE — Telephone Encounter (Signed)
pls  Re send referral for this patient to Dr Hassell Done at possibly [facility] surgery, he is not in [place] as pt stated, pls check, he is Dr Zenaida Niece who did her bariatric surgery

## 2018-03-08 NOTE — Telephone Encounter (Signed)
ordered

## 2018-03-09 ENCOUNTER — Encounter: Payer: Self-pay | Admitting: Family Medicine

## 2018-03-09 LAB — CBC
HEMATOCRIT: 40.9 % (ref 35.0–45.0)
HEMOGLOBIN: 13.1 g/dL (ref 11.7–15.5)
MCH: 23 pg — ABNORMAL LOW (ref 27.0–33.0)
MCHC: 32 g/dL (ref 32.0–36.0)
MCV: 71.8 fL — ABNORMAL LOW (ref 80.0–100.0)
MPV: 11.1 fL (ref 7.5–12.5)
Platelets: 167 10*3/uL (ref 140–400)
RBC: 5.7 10*6/uL — ABNORMAL HIGH (ref 3.80–5.10)
RDW: 15.7 % — AB (ref 11.0–15.0)
WBC: 6.2 10*3/uL (ref 3.8–10.8)

## 2018-03-09 LAB — COMPLETE METABOLIC PANEL WITH GFR
AG RATIO: 0.9 (calc) — AB (ref 1.0–2.5)
ALT: 44 U/L — ABNORMAL HIGH (ref 6–29)
AST: 75 U/L — ABNORMAL HIGH (ref 10–35)
Albumin: 3.4 g/dL — ABNORMAL LOW (ref 3.6–5.1)
Alkaline phosphatase (APISO): 146 U/L — ABNORMAL HIGH (ref 33–130)
BUN: 8 mg/dL (ref 7–25)
CALCIUM: 8.9 mg/dL (ref 8.6–10.4)
CO2: 28 mmol/L (ref 20–32)
CREATININE: 0.74 mg/dL (ref 0.50–1.05)
Chloride: 109 mmol/L (ref 98–110)
GFR, EST AFRICAN AMERICAN: 109 mL/min/{1.73_m2} (ref >=60)
GFR, EST NON AFRICAN AMERICAN: 94 mL/min/{1.73_m2} (ref >=60)
GLOBULIN: 4 g/dL — AB (ref 1.9–3.7)
Glucose, Bld: 99 mg/dL (ref 65–99)
Potassium: 4.1 mmol/L (ref 3.5–5.3)
SODIUM: 142 mmol/L (ref 135–146)
TOTAL PROTEIN: 7.4 g/dL (ref 6.1–8.1)
Total Bilirubin: 1 mg/dL (ref 0.2–1.2)

## 2018-03-09 LAB — HEMOGLOBIN A1C
EAG (MMOL/L): 6.6 (calc)
Hgb A1c MFr Bld: 5.8 % of total Hgb — ABNORMAL HIGH (ref <5.7)
Mean Plasma Glucose: 120 (calc)

## 2018-03-09 LAB — TSH: TSH: 0.69 mIU/L

## 2018-03-09 LAB — VITAMIN D 25 HYDROXY (VIT D DEFICIENCY, FRACTURES): VIT D 25 HYDROXY: 32 ng/mL (ref 30–100)

## 2018-03-09 NOTE — Telephone Encounter (Signed)
Faxed request to [facility].

## 2018-03-21 ENCOUNTER — Ambulatory Visit (HOSPITAL_COMMUNITY): Payer: BC Managed Care – PPO | Admitting: Psychiatry

## 2018-04-11 ENCOUNTER — Ambulatory Visit (HOSPITAL_COMMUNITY): Payer: BC Managed Care – PPO | Admitting: Psychiatry

## 2018-04-25 ENCOUNTER — Ambulatory Visit (INDEPENDENT_AMBULATORY_CARE_PROVIDER_SITE_OTHER): Payer: BC Managed Care – PPO | Admitting: Psychiatry

## 2018-04-25 DIAGNOSIS — F411 Generalized anxiety disorder: Secondary | ICD-10-CM
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
## 2018-04-27 ENCOUNTER — Other Ambulatory Visit (HOSPITAL_COMMUNITY): Payer: Self-pay | Admitting: Psychiatry

## 2018-04-28 ENCOUNTER — Other Ambulatory Visit: Payer: Self-pay | Admitting: Family Medicine

## 2018-05-03 ENCOUNTER — Encounter (HOSPITAL_COMMUNITY): Payer: Self-pay | Admitting: Psychiatry

## 2018-05-03 ENCOUNTER — Ambulatory Visit (INDEPENDENT_AMBULATORY_CARE_PROVIDER_SITE_OTHER): Payer: BC Managed Care – PPO | Admitting: Psychiatry

## 2018-05-03 VITALS — BP 155/81 | HR 68 | Ht 62.0 in | Wt 295.0 lb

## 2018-05-03 DIAGNOSIS — F411 Generalized anxiety disorder: Secondary | ICD-10-CM | POA: Diagnosis not present

## 2018-05-03 MED ORDER — VENLAFAXINE HCL ER 150 MG PO CP24: 150.0000 mg | ORAL_CAPSULE | Freq: Every day | ORAL | 2 refills | Status: DC

## 2018-05-03 MED ORDER — TRAZODONE HCL 50 MG PO TABS: ORAL_TABLET | ORAL | 2 refills | Status: DC

## 2018-05-03 MED ORDER — ALPRAZOLAM 0.5 MG PO TABS: 0.5000 mg | ORAL_TABLET | Freq: Two times a day (BID) | ORAL | 2 refills | Status: DC | PRN
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
## 2018-05-08 ENCOUNTER — Encounter: Payer: Self-pay | Admitting: Family Medicine

## 2018-05-09 ENCOUNTER — Encounter: Payer: Self-pay | Admitting: Family Medicine

## 2018-05-09 DIAGNOSIS — Z0184 Encounter for antibody response examination: Secondary | ICD-10-CM

## 2018-05-11 ENCOUNTER — Telehealth: Payer: Self-pay

## 2018-05-11 DIAGNOSIS — Z0184 Encounter for antibody response examination: Secondary | ICD-10-CM

## 2018-05-11 NOTE — Telephone Encounter (Signed)
Lab ordered.

## 2018-05-13 ENCOUNTER — Encounter: Payer: Self-pay | Admitting: Family Medicine

## 2018-05-13 LAB — HEPATITIS B SURFACE ANTIBODY,QUALITATIVE: HEP B S AB: NONREACTIVE

## 2018-05-13 LAB — QUANTIFERON-TB GOLD PLUS
Mitogen-NIL: >10 IU/mL
NIL: 0.2 IU/mL
QuantiFERON-TB Gold Plus: NEGATIVE

## 2018-05-13 LAB — VARICELLA ZOSTER ANTIBODY, IGG: Varicella IgG: <135 index — ABNORMAL LOW

## 2018-06-13 ENCOUNTER — Ambulatory Visit: Payer: Self-pay | Admitting: Nutrition

## 2018-06-16 ENCOUNTER — Ambulatory Visit (INDEPENDENT_AMBULATORY_CARE_PROVIDER_SITE_OTHER): Payer: BC Managed Care – PPO

## 2018-06-16 DIAGNOSIS — Z23 Encounter for immunization: Secondary | ICD-10-CM | POA: Diagnosis not present

## 2018-07-10 ENCOUNTER — Encounter

## 2018-07-10 ENCOUNTER — Ambulatory Visit (HOSPITAL_COMMUNITY): Payer: Self-pay | Admitting: Psychiatry

## 2018-07-25 ENCOUNTER — Ambulatory Visit (HOSPITAL_COMMUNITY): Payer: BC Managed Care – PPO | Admitting: Psychiatry

## 2018-07-25 ENCOUNTER — Encounter (HOSPITAL_COMMUNITY): Payer: Self-pay | Admitting: Psychiatry

## 2018-07-25 DIAGNOSIS — F411 Generalized anxiety disorder: Secondary | ICD-10-CM
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
## 2018-07-26 ENCOUNTER — Other Ambulatory Visit: Payer: Self-pay | Admitting: Family Medicine

## 2018-07-26 DIAGNOSIS — Z1231 Encounter for screening mammogram for malignant neoplasm of breast: Secondary | ICD-10-CM

## 2018-07-27 ENCOUNTER — Encounter: Payer: Self-pay | Admitting: Family Medicine

## 2018-07-27 ENCOUNTER — Ambulatory Visit: Payer: BC Managed Care – PPO | Admitting: Family Medicine

## 2018-07-27 VITALS — BP 124/70 | HR 67 | Resp 12 | Ht 62.0 in | Wt 296.0 lb

## 2018-07-27 DIAGNOSIS — I10 Essential (primary) hypertension: Secondary | ICD-10-CM

## 2018-07-27 DIAGNOSIS — B369 Superficial mycosis, unspecified: Secondary | ICD-10-CM

## 2018-07-27 DIAGNOSIS — J0100 Acute maxillary sinusitis, unspecified: Secondary | ICD-10-CM

## 2018-07-27 DIAGNOSIS — L0292 Furuncle, unspecified: Secondary | ICD-10-CM

## 2018-07-27 DIAGNOSIS — Z13220 Encounter for screening for lipoid disorders: Secondary | ICD-10-CM

## 2018-07-27 DIAGNOSIS — E559 Vitamin D deficiency, unspecified: Secondary | ICD-10-CM

## 2018-07-27 MED ORDER — DOXYCYCLINE HYCLATE 100 MG PO TABS: 100.0000 mg | ORAL_TABLET | Freq: Two times a day (BID) | ORAL | 0 refills | Status: DC

## 2018-07-27 MED ORDER — CLOTRIMAZOLE-BETAMETHASONE 1-0.05 % EX CREA: TOPICAL_CREAM | CUTANEOUS | 1 refills | Status: DC

## 2018-07-27 MED ORDER — ESOMEPRAZOLE MAGNESIUM 40 MG PO PACK: 40.0000 mg | PACK | Freq: Every day | ORAL | 3 refills | Status: DC

## 2018-07-27 MED ORDER — NYSTATIN 100000 UNIT/GM EX POWD: CUTANEOUS | 1 refills | Status: DC

## 2018-07-27 MED ORDER — FLUCONAZOLE 150 MG PO TABS: ORAL_TABLET | ORAL | 0 refills | Status: DC

## 2018-07-27 NOTE — Progress Notes (Signed)
Holly Alvarado     MRN: [id number]      DOB: Jul 25, 1966   HPI Holly Alvarado is here for follow up and re-evaluation of chronic medical conditions, medication management and review of any available recent lab and radiology data.  Preventive health is updated, specifically  Cancer screening and Immunization.   Questions or concerns regarding consultations or procedures which the PT has had in the interim are  addressed. The PT denies any adverse reactions to current medications since the last visit.  1 week h/o worsening head and chest congestion, associated with fever and chills intermittently. Nasal drainage has thickened , and is yellowish green, and at times bloody. Sputum is thick and yellow. C/o bilateral ear pressure, c/o right ear pain and sore throat Increasing fatigue , poor appetitie and sleep disturbed by cough. No improvement with OTC medication. C/o rash on neck 4 days  Also lower abdomen and thighs for 6 monhts ROS  C/o uncontrolled reflux , does not take her PPI. Chronic constipation despite daily miralax, states corn will make her go so will start this and daily stool softeners  Denies dysuria, frequency, hesitancy or incontinence. Denies joint pain, swelling and limitation in mobility. Denies headaches, seizures, numbness, or tingling. Denies depression, anxiety or insomnia. Rash oonlower abnbdomen and thighs itches x 6 months   PE  BP 124/70 (BP Location: Right Arm, Patient Position: Sitting, Cuff Size: Large)   Pulse 67   Resp 12   Ht 5' 2"  (1.575 m)   Wt 296 lb (134.3 kg)   SpO2 100% Comment: room air  BMI 54.14 kg/m   Patient alert and oriented and in no cardiopulmonary distress.  HEENT: No facial asymmetry, EOMI,   oropharynx pink and moist.  Neck supple no JVD, no mass.frontal and maxillary sinuses are tender, TM clear, oropharynx, mild erythema , no exudate  Chest: Clear to auscultation bilaterally.  CVS: S1, S2 no murmurs, no S3.Regular rate.  ABD:  Soft non tender.   Ext: No edema  MS: Adequate ROM spine, shoulders, hips and knees.  Skin:fungal   rash noted.and boil on lower abdomen, partially open  Psych: Good eye contact, normal affect. Memory intact not anxious or depressed appearing.  CNS: CN 2-12 intact, power,  normal throughout.no focal deficits noted.   Assessment & Plan  Acute sinusitis Antibiotic prescribed, and saline nasal flushes encouraged  Boil Open boil on lower abdomen , oral antibiotic prescribed  Dermatomycosis Topical antifungal prescribed  Essential hypertension Controlled, no change in medication DASH diet and commitment to daily physical activity for a minimum of 30 minutes discussed and encouraged, as a part of hypertension management. The importance of attaining a healthy weight is also discussed.  BP/Weight 08/10/2018 07/31/2018 07/27/2018 03/01/2018 11/23/2017 25/01/3975 03/29/4192  Systolic BP 790 240 973 532 992 426 -  Diastolic BP 80 84 70 80 64 92 -  Wt. (Lbs) 293.2 - 296 295 288 294.5 289  BMI 53.63 - 54.14 53.96 52.68 53.86 52.86  Some encounter information is confidential and restricted. Go to Review Flowsheets activity to see all data.       Morbid obesity ([place]) Deteriorated. Patient re-educated about  the importance of commitment to a  minimum of 150 minutes of exercise per week.  The importance of healthy food choices with portion control discussed. Encouraged to start a food diary, count calories and to consider  joining a support group. Sample diet sheets offered. Goals set by the patient for the next several months.  Weight /BMI 08/10/2018 07/27/2018 03/01/2018  WEIGHT 293 lb 3.2 oz 296 lb 295 lb  HEIGHT 5' 2"  5' 2"  5' 2"   BMI 53.63 kg/m2 54.14 kg/m2 53.96 kg/m2  Some encounter information is confidential and restricted. Go to Review Flowsheets activity to see all data.

## 2018-07-27 NOTE — Patient Instructions (Addendum)
Cancel December appt, reschedule to end January  Shingrix #1  On a Friday with nurse, pls schedule ( needs for school)  Mindful eating and you are treated for sinusitis, skin infection with  Boils also  Yeast and fungal infection  Conarats on weight loss , goal is 6 pounds in next 3 months  Tylenol for sinus  Headache , and chest wall pain  Resume reflux med  It is important that you exercise regularly at least 30 minutes 5 times a week. If you develop chest pain, have severe difficulty breathing, or feel very tired, stop exercising immediately and seek medical attention    Thanks for choosing  [facility], we consider it a privelige to serve you.

## 2018-07-28 LAB — COMPLETE METABOLIC PANEL WITH GFR
AG RATIO: 0.7 (calc) — AB (ref 1.0–2.5)
ALBUMIN MSPROF: 3.4 g/dL — AB (ref 3.6–5.1)
ALT: 57 U/L — ABNORMAL HIGH (ref 6–29)
AST: 117 U/L — ABNORMAL HIGH (ref 10–35)
Alkaline phosphatase (APISO): 144 U/L — ABNORMAL HIGH (ref 33–130)
BILIRUBIN TOTAL: 0.8 mg/dL (ref 0.2–1.2)
BUN: 8 mg/dL (ref 7–25)
CO2: 28 mmol/L (ref 20–32)
CREATININE: 0.74 mg/dL (ref 0.50–1.05)
Calcium: 9.1 mg/dL (ref 8.6–10.4)
Chloride: 109 mmol/L (ref 98–110)
GFR, Est African American: 108 mL/min/{1.73_m2} (ref >=60)
GFR, Est Non African American: 93 mL/min/{1.73_m2} (ref >=60)
GLOBULIN: 4.6 g/dL — AB (ref 1.9–3.7)
Glucose, Bld: 72 mg/dL (ref 65–99)
POTASSIUM: 3.8 mmol/L (ref 3.5–5.3)
SODIUM: 142 mmol/L (ref 135–146)
Total Protein: 8 g/dL (ref 6.1–8.1)

## 2018-07-28 LAB — LIPID PANEL
Cholesterol: 144 mg/dL (ref <200)
HDL: 28 mg/dL — ABNORMAL LOW (ref >50)
LDL Cholesterol (Calc): 91 mg/dL (calc)
Non-HDL Cholesterol (Calc): 116 mg/dL (calc) (ref <130)
TRIGLYCERIDES: 149 mg/dL (ref <150)
Total CHOL/HDL Ratio: 5.1 (calc) — ABNORMAL HIGH (ref <5.0)

## 2018-07-28 LAB — VITAMIN D 25 HYDROXY (VIT D DEFICIENCY, FRACTURES): Vit D, 25-Hydroxy: 28 ng/mL — ABNORMAL LOW (ref 30–100)

## 2018-07-31 ENCOUNTER — Ambulatory Visit (INDEPENDENT_AMBULATORY_CARE_PROVIDER_SITE_OTHER): Payer: BC Managed Care – PPO

## 2018-07-31 ENCOUNTER — Ambulatory Visit: Payer: BC Managed Care – PPO | Admitting: Podiatry

## 2018-07-31 ENCOUNTER — Encounter: Payer: Self-pay | Admitting: Podiatry

## 2018-07-31 ENCOUNTER — Other Ambulatory Visit: Payer: Self-pay | Admitting: Podiatry

## 2018-07-31 VITALS — BP 150/84 | HR 62

## 2018-07-31 DIAGNOSIS — S92505A Nondisplaced unspecified fracture of left lesser toe(s), initial encounter for closed fracture: Secondary | ICD-10-CM

## 2018-07-31 DIAGNOSIS — M778 Other enthesopathies, not elsewhere classified: Secondary | ICD-10-CM

## 2018-07-31 DIAGNOSIS — M779 Enthesopathy, unspecified: Principal | ICD-10-CM

## 2018-07-31 DIAGNOSIS — M79671 Pain in right foot: Secondary | ICD-10-CM

## 2018-08-01 NOTE — Progress Notes (Signed)
Subjective:   Patient ID: Holly Alvarado, female   DOB: 52 y.o.   MRN: [id number]   HPI Patient presents stating she has had quite a bit of discomfort in the left fifth digit after having most likely broken her toe about 5 months ago and has discomfort in the right big toe joint right which is hard to walk with.  Patient does not remember specific injury to the right foot but states is also been sore and she is having trouble ambulating.  Patient does not smoke and likes to be active   Review of Systems  All other systems reviewed and are negative.       Objective:  Physical Exam  Constitutional: She appears well-developed and well-nourished.  Cardiovascular: Intact distal pulses.  Pulmonary/Chest: Effort normal.  Musculoskeletal: Normal range of motion.  Neurological: She is alert.  Skin: Skin is warm.  Nursing note and vitals reviewed.   Neuro vascular status intact muscle strength was adequate range of motion within normal limits with patient found to have mild discomfort and edema of the left fifth digit interphalangeal joint and inflammation fluid around the first MPJ right with no crepitus or loss of motion noted.  Patient has good digital perfusion and is well oriented x3     Assessment:  Hammertoe deformity fifth left with probable fracture which is most likely healed but left inflammatory bursitis with capsulitis of the right first MPJ probably due to gait change     Plan:  H&P x-rays reviewed bilaterally today careful interphalangeal joint injections fifth digit left administered 1 mg dexamethasone Kenalog 3 mg Xylocaine after sterile prep and injected the first MPJ 3 mg Kenalog 5 mg Xylocaine periarticular.  I advised this patient on rigid bottom shoes reduced activity and if symptoms persist to be seen back hopefully this will allow recovery  X-ray indicates there was a previous fracture fifth digit left but it appears to be healed at this time no indications of bone  pathology around the first MPJ right

## 2018-08-02 ENCOUNTER — Other Ambulatory Visit: Payer: Self-pay | Admitting: Family Medicine

## 2018-08-02 DIAGNOSIS — R740 Nonspecific elevation of levels of transaminase and lactic acid dehydrogenase [LDH]: Principal | ICD-10-CM

## 2018-08-02 DIAGNOSIS — R7401 Elevation of levels of liver transaminase levels: Secondary | ICD-10-CM

## 2018-08-02 NOTE — Progress Notes (Signed)
[facility]

## 2018-08-03 ENCOUNTER — Encounter (HOSPITAL_COMMUNITY): Payer: Self-pay | Admitting: Psychiatry

## 2018-08-03 ENCOUNTER — Ambulatory Visit (HOSPITAL_COMMUNITY): Payer: BC Managed Care – PPO | Admitting: Psychiatry

## 2018-08-03 VITALS — BP 160/84 | HR 66 | Ht 62.0 in | Wt 296.0 lb

## 2018-08-03 DIAGNOSIS — F411 Generalized anxiety disorder: Secondary | ICD-10-CM | POA: Diagnosis not present

## 2018-08-03 MED ORDER — TRAZODONE HCL 50 MG PO TABS: ORAL_TABLET | ORAL | 2 refills | Status: DC

## 2018-08-03 MED ORDER — ALPRAZOLAM 0.5 MG PO TABS: 0.5000 mg | ORAL_TABLET | Freq: Two times a day (BID) | ORAL | 2 refills | Status: DC | PRN

## 2018-08-03 MED ORDER — VENLAFAXINE HCL ER 150 MG PO CP24: 150.0000 mg | ORAL_CAPSULE | Freq: Every day | ORAL | 2 refills | Status: DC
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
## 2018-08-08 ENCOUNTER — Ambulatory Visit (HOSPITAL_COMMUNITY): Payer: BC Managed Care – PPO | Admitting: Psychiatry

## 2018-08-08 ENCOUNTER — Encounter (HOSPITAL_COMMUNITY): Payer: Self-pay | Admitting: Psychiatry

## 2018-08-08 DIAGNOSIS — F411 Generalized anxiety disorder: Secondary | ICD-10-CM | POA: Diagnosis not present
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
## 2018-08-10 ENCOUNTER — Encounter (INDEPENDENT_AMBULATORY_CARE_PROVIDER_SITE_OTHER): Payer: Self-pay | Admitting: *Deleted

## 2018-08-10 ENCOUNTER — Encounter (INDEPENDENT_AMBULATORY_CARE_PROVIDER_SITE_OTHER): Payer: Self-pay | Admitting: Internal Medicine

## 2018-08-10 ENCOUNTER — Ambulatory Visit (INDEPENDENT_AMBULATORY_CARE_PROVIDER_SITE_OTHER): Payer: BC Managed Care – PPO | Admitting: Internal Medicine

## 2018-08-10 VITALS — BP 110/80 | HR 72 | Temp 97.7°F | Ht 62.0 in | Wt 293.2 lb

## 2018-08-10 DIAGNOSIS — R748 Abnormal levels of other serum enzymes: Secondary | ICD-10-CM

## 2018-08-10 NOTE — Progress Notes (Signed)
Subjective:    Patient ID: Holly Alvarado, female    DOB: 1965/12/17, 52 y.o.   MRN: [id number]  HPI Referred by Dr. Moshe Cipro for elevated liver. Hx of same per epic since 2014. Patient states her liver enzymes have been elevated greater than 10 yrs.  She tells me today she has stomach pain off and on . Symptoms for years off and on. Pain will radiated into her back.  She says she will have one episode of day recently. If she lies on her stomach and be still and takes 2 ES Tylenol. No pain in office today.  Pain may last 1-2 hrs.  Her appetite is good. No weight loss.  Has a BM  Gastric bypass in 2015  Dr. Hassell Done in [place] Has seen Dr. Laural Golden in the past for same.  04/24/2015 Acute hepatitis panel negative. 06/03/2015 MR abdomen w/wo: rt upper quadrant pain, elevated liver enzymes.  Recent lipid profile is normal.  IMPRESSION: 1. Subtle changes within the liver which may be indicative of early cirrhosis. Consider further evaluation and risk stratification with ultrasound liver elastography. 2. Mild increase caliber of the common bile duct status post cholecystectomy. No obstructing stone or mass noted.   04/22/2015 Acute hepatitis panel negative.   12/30/2010 EGD: recurrent epigastric pain.  Focal antral gastritis with a scar. No evidence of erosive esophagitis or active PUD.   Hepatic Function Latest Ref Rng & Units 07/27/2018 03/08/2018 09/15/2017  Total Protein 6.1 - 8.1 g/dL 8.0 7.4 7.4  Albumin 3.6 - 5.1 g/dL - - -  AST 10 - 35 U/L 117(H) 75(H) 98(H)  ALT 6 - 29 U/L 57(H) 44(H) 49(H)  Alk Phosphatase 33 - 130 U/L - - -  Total Bilirubin 0.2 - 1.2 mg/dL 0.8 1.0 0.8  Bilirubin, Direct <=0.2 mg/dL - - -   Lipid Panel     Component Value Date/Time   CHOL 144 07/27/2018 0909   TRIG 149 07/27/2018 0909   HDL 28 (L) 07/27/2018 0909   CHOLHDL 5.1 (H) 07/27/2018 0909   VLDL 22 02/01/2017 0706   LDLCALC 91 07/27/2018 0909       Review of Systems Past Medical History:    Diagnosis Date  . Allergic rhinitis, seasonal   . Anemia    hx of   . Anxiety    hx of panic attacks and panic attacks with surgery   . Anxiety disorder   . Chronic constipation   . Complication of anesthesia    woke up during surgery of tubal ligation   . Esophageal polyp   . Fatty liver   . GERD (gastroesophageal reflux disease)   . Hemorrhoids   . Hypertension   . Obesity     Past Surgical History:  Procedure Laterality Date  . BLT  1987  . BREAST BIOPSY Right 2010   benign  . BREATH TEK H PYLORI N/A 02/25/2014   Procedure: BREATH TEK H PYLORI;  Surgeon: Pedro Earls, MD;  Location: [facility];  Service: General;  Laterality: N/A;  . CHOLECYSTECTOMY  1997  . COLONOSCOPY  12/30/2010  . COLONOSCOPY N/A 11/23/2017   Procedure: COLONOSCOPY;  Surgeon: Rogene Houston, MD;  Location: [facility];  Service: Endoscopy;  Laterality: N/A;  1130  . [place]  . 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  MOUTH SURGERY     wisdom tooth extraction  . PARTIAL HYSTERECTOMY    . TOTAL ABDOMINAL HYSTERECTOMY  03/11/08   for fibroids   . TUBAL LIGATION    . UPPER GASTROINTESTINAL ENDOSCOPY  12/30/2010  . UPPER GASTROINTESTINAL ENDOSCOPY  05/11/04  . UPPER GASTROINTESTINAL ENDOSCOPY  04/30/97   ANWAR    Allergies  Allergen Reactions  . Codeine     Severe headache.   . Sulfonamide Derivatives Hives and Itching  . Tape     Foam tape - caused blisters and sever itching    Current Outpatient Medications on File Prior to Visit  Medication Sig Dispense Refill  . acetaminophen (TYLENOL) 500 MG tablet Take 500 mg by mouth every 6 (six) hours as needed.    . ALPRAZolam (XANAX) 0.5 MG tablet Take 1 tablet (0.5 mg total) by mouth 2 (two) times daily as needed for anxiety. 60 tablet 2  . amLODipine  (NORVASC) 2.5 MG tablet Take 1 tablet (2.5 mg total) by mouth daily. 90 tablet 1  . Calcium Carb-Cholecalciferol (CALTRATE 600+D3 SOFT) 600-800 MG-UNIT CHEW Chew 1 Piece by mouth daily.    . clotrimazole-betamethasone (LOTRISONE) cream Apply to ras(es)h twice daily for 10 days then as needed 45 g 1  . diphenhydrAMINE (BENADRYL) 50 MG capsule Take 50 mg by mouth as needed. Patient states that she will take 25 to 50 mg depending upon symptoms.    Marland Kitchen doxycycline (VIBRA-TABS) 100 MG tablet Take 1 tablet (100 mg total) by mouth 2 (two) times daily. 20 tablet 0  . esomeprazole (NEXIUM) 40 MG packet Take 40 mg by mouth daily before breakfast. 90 each 3  . fluconazole (DIFLUCAN) 150 MG tablet Take one tablet once daily  If needed for vaginal itch following antibiotic course 2 tablet 0  . fluticasone (FLONASE) 50 MCG/ACT nasal spray Place 2 sprays into both nostrils daily as needed for allergies or rhinitis.    Marland Kitchen loratadine (CLARITIN) 10 MG tablet Take 10 mg by mouth daily as needed for allergies.     . metoprolol tartrate (LOPRESSOR) 50 MG tablet TAKE 1 TABLET BY MOUTH TWICE A DAY 180 tablet 1  . Multiple Vitamin (MULTIVITAMIN) capsule Take 2 capsules by mouth daily.     Marland Kitchen nystatin (MYCOSTATIN/NYSTOP) powder Apply to affected area twice daily for 10 days , then as needed 45 g 1  . Potassium 99 MG TABS Take 1 tablet by mouth daily.    . traZODone (DESYREL) 50 MG tablet TAKE 1 TABLET BY MOUTH EVERYDAY AT BEDTIME 90 tablet 2  . venlafaxine XR (EFFEXOR-XR) 150 MG 24 hr capsule Take 1 capsule (150 mg total) by mouth daily. 90 capsule 2  . vitamin B-12 (CYANOCOBALAMIN) 1000 MCG tablet Take 1,000 mcg by mouth daily.    . Vitamin D, Ergocalciferol, (DRISDOL) 50000 units CAPS capsule TAKE 1 CAPSULE (50,000 UNITS TOTAL) BY MOUTH ONCE A WEEK. ONE CAPSULE ONCE WEEKLY 12 capsule 1   No current facility-administered medications on file prior to visit.         Objective:   Physical Exam Blood pressure 110/80, pulse  72, temperature 97.7 F (36.5 C), height 5' 2"  (1.575 m), weight 293 lb 3.2 oz (133 kg). Alert and oriented. Skin warm and dry. Oral mucosa is moist.   . Sclera anicteric, conjunctivae is pink. Thyroid not enlarged. No cervical lymphadenopathy. Lungs clear. Heart regular rate and rhythm.  Abdomen is soft. Bowel sounds are positive. No hepatomegaly. No abdominal masses felt. No tenderness.  No edema to lower extremities.          Assessment & Plan:  Elevated liver enzymes.  Am going to get an hepatic, CBC and  US abdomen with elast.  Further recommendations to follow.

## 2018-08-10 NOTE — Patient Instructions (Signed)
Labs and [place]. Diet and exercise.

## 2018-08-17 ENCOUNTER — Ambulatory Visit (HOSPITAL_COMMUNITY)
Admission: RE | Admit: 2018-08-17 | Discharge: 2018-08-17 | Disposition: A | Discharge status: Home / Self Care | Payer: BC Managed Care – PPO | Source: Ambulatory Visit | Attending: Internal Medicine | Admitting: Internal Medicine

## 2018-08-17 DIAGNOSIS — R748 Abnormal levels of other serum enzymes: Secondary | ICD-10-CM | POA: Diagnosis not present

## 2018-08-17 DIAGNOSIS — Z9049 Acquired absence of other specified parts of digestive tract: Secondary | ICD-10-CM | POA: Insufficient documentation

## 2018-08-17 LAB — CBC WITH DIFFERENTIAL/PLATELET
BASOS PCT: 0.8 %
Basophils Absolute: 59 cells/uL (ref 0–200)
EOS PCT: 1.6 %
Eosinophils Absolute: 118 cells/uL (ref 15–500)
HCT: 42.5 % (ref 35.0–45.0)
Hemoglobin: 13.4 g/dL (ref 11.7–15.5)
Lymphs Abs: 5446 cells/uL — ABNORMAL HIGH (ref 850–3900)
MCH: 23.1 pg — ABNORMAL LOW (ref 27.0–33.0)
MCHC: 31.5 g/dL — ABNORMAL LOW (ref 32.0–36.0)
MCV: 73.1 fL — ABNORMAL LOW (ref 80.0–100.0)
MPV: 12.3 fL (ref 7.5–12.5)
Monocytes Relative: 5.2 %
Neutro Abs: 1391 cells/uL — ABNORMAL LOW (ref 1500–7800)
Neutrophils Relative %: 18.8 %
PLATELETS: 192 10*3/uL (ref 140–400)
RBC: 5.81 10*6/uL — AB (ref 3.80–5.10)
RDW: 15.3 % — ABNORMAL HIGH (ref 11.0–15.0)
TOTAL LYMPHOCYTE: 73.6 %
WBC: 7.4 10*3/uL (ref 3.8–10.8)
WBCMIX: 385 {cells}/uL (ref 200–950)

## 2018-08-17 LAB — HEPATIC FUNCTION PANEL
AG Ratio: 0.8 (calc) — ABNORMAL LOW (ref 1.0–2.5)
ALKALINE PHOSPHATASE (APISO): 144 U/L — AB (ref 33–130)
ALT: 48 U/L — AB (ref 6–29)
AST: 104 U/L — AB (ref 10–35)
Albumin: 3.3 g/dL — ABNORMAL LOW (ref 3.6–5.1)
BILIRUBIN TOTAL: 0.7 mg/dL (ref 0.2–1.2)
Bilirubin, Direct: 0.3 mg/dL — ABNORMAL HIGH (ref 0.0–0.2)
Globulin: 4.4 g/dL (calc) — ABNORMAL HIGH (ref 1.9–3.7)
Indirect Bilirubin: 0.4 mg/dL (calc) (ref 0.2–1.2)
TOTAL PROTEIN: 7.7 g/dL (ref 6.1–8.1)

## 2018-08-21 ENCOUNTER — Ambulatory Visit (HOSPITAL_COMMUNITY): Payer: Self-pay | Admitting: Psychiatry

## 2018-08-23 DIAGNOSIS — L0292 Furuncle, unspecified: Secondary | ICD-10-CM | POA: Insufficient documentation

## 2018-08-23 NOTE — Assessment & Plan Note (Signed)
Open boil on lower abdomen , oral antibiotic prescribed

## 2018-08-23 NOTE — Assessment & Plan Note (Signed)
Topical antifungal prescribed

## 2018-08-23 NOTE — Assessment & Plan Note (Signed)
Antibiotic prescribed, and saline nasal flushes encouraged

## 2018-08-23 NOTE — Assessment & Plan Note (Signed)
Deteriorated. Patient re-educated about  the importance of commitment to a  minimum of 150 minutes of exercise per week.  The importance of healthy food choices with portion control discussed. Encouraged to start a food diary, count calories and to consider  joining a support group. Sample diet sheets offered. Goals set by the patient for the next several months.   Weight /BMI 08/10/2018 07/27/2018 03/01/2018  WEIGHT 293 lb 3.2 oz 296 lb 295 lb  HEIGHT 5' 2"  5' 2"  5' 2"   BMI 53.63 kg/m2 54.14 kg/m2 53.96 kg/m2  Some encounter information is confidential and restricted. Go to Review Flowsheets activity to see all data.

## 2018-08-23 NOTE — Assessment & Plan Note (Signed)
Controlled, no change in medication DASH diet and commitment to daily physical activity for a minimum of 30 minutes discussed and encouraged, as a part of hypertension management. The importance of attaining a healthy weight is also discussed.  BP/Weight 08/10/2018 07/31/2018 07/27/2018 03/01/2018 11/23/2017 80/02/3493 05/31/4738  Systolic BP 584 417 127 871 836 725 -  Diastolic BP 80 84 70 80 64 92 -  Wt. (Lbs) 293.2 - 296 295 288 294.5 289  BMI 53.63 - 54.14 53.96 52.68 53.86 52.86  Some encounter information is confidential and restricted. Go to Review Flowsheets activity to see all data.

## 2018-08-31 ENCOUNTER — Ambulatory Visit: Payer: Self-pay | Admitting: Family Medicine

## 2018-09-04 ENCOUNTER — Ambulatory Visit (INDEPENDENT_AMBULATORY_CARE_PROVIDER_SITE_OTHER): Payer: BC Managed Care – PPO | Admitting: Psychiatry

## 2018-09-04 DIAGNOSIS — F411 Generalized anxiety disorder: Secondary | ICD-10-CM
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
## 2018-09-07 ENCOUNTER — Telehealth (INDEPENDENT_AMBULATORY_CARE_PROVIDER_SITE_OTHER): Payer: Self-pay | Admitting: Internal Medicine

## 2018-09-07 DIAGNOSIS — R748 Abnormal levels of other serum enzymes: Secondary | ICD-10-CM

## 2018-09-07 NOTE — Telephone Encounter (Signed)
Holly Alvarado,  Could u send this patient a letter asking her to go to lab. I have put in lab orders. She is not returning my call.   Thanks. Terri

## 2018-09-07 NOTE — Telephone Encounter (Signed)
Needs OV in about 10 weeks.

## 2018-09-11 ENCOUNTER — Ambulatory Visit
Admission: RE | Admit: 2018-09-11 | Discharge: 2018-09-11 | Disposition: A | Discharge status: Home / Self Care | Payer: BC Managed Care – PPO | Source: Ambulatory Visit | Attending: Family Medicine | Admitting: Family Medicine

## 2018-09-11 DIAGNOSIS — Z1231 Encounter for screening mammogram for malignant neoplasm of breast: Secondary | ICD-10-CM

## 2018-09-18 ENCOUNTER — Ambulatory Visit (HOSPITAL_COMMUNITY): Payer: Self-pay | Admitting: Psychiatry

## 2018-10-17 ENCOUNTER — Ambulatory Visit (HOSPITAL_COMMUNITY): Payer: BC Managed Care – PPO | Admitting: Psychiatry

## 2018-10-24 ENCOUNTER — Ambulatory Visit: Payer: BC Managed Care – PPO | Admitting: Family Medicine

## 2018-10-24 ENCOUNTER — Encounter: Payer: Self-pay | Admitting: Family Medicine

## 2018-10-24 VITALS — BP 118/80 | HR 79 | Resp 12 | Ht 62.0 in | Wt 299.0 lb

## 2018-10-24 DIAGNOSIS — I10 Essential (primary) hypertension: Secondary | ICD-10-CM | POA: Diagnosis not present

## 2018-10-24 DIAGNOSIS — F5104 Psychophysiologic insomnia: Secondary | ICD-10-CM

## 2018-10-24 DIAGNOSIS — F411 Generalized anxiety disorder: Secondary | ICD-10-CM

## 2018-10-24 DIAGNOSIS — R7303 Prediabetes: Secondary | ICD-10-CM

## 2018-10-24 DIAGNOSIS — F332 Major depressive disorder, recurrent severe without psychotic features: Secondary | ICD-10-CM

## 2018-10-24 DIAGNOSIS — R5383 Other fatigue: Secondary | ICD-10-CM

## 2018-10-24 MED ORDER — ESOMEPRAZOLE MAGNESIUM 40 MG PO PACK: 40.0000 mg | PACK | Freq: Every day | ORAL | 3 refills | Status: DC

## 2018-10-24 NOTE — Patient Instructions (Signed)
F/U in 4 months, call if you need me before   Vit B12 , cmp and EGFR, hBA1C this week please, you may also get GI labs at same time if they want them now   I will try get the new medication for help with weight loss , but as YOU say, food choice is THE KEY, will be back in touch by the end of the week  I will message Dr Harrington Challenger re your poor sleep and also uncontrolled Anxiety so you can address at next visit   Do NOT give up on working through your health challenges , one day at a time, we are here for you

## 2018-10-25 ENCOUNTER — Other Ambulatory Visit: Payer: Self-pay | Admitting: Family Medicine

## 2018-10-26 ENCOUNTER — Other Ambulatory Visit: Payer: Self-pay | Admitting: Family Medicine

## 2018-10-26 MED ORDER — PANTOPRAZOLE SODIUM 40 MG PO TBEC: 40.0000 mg | DELAYED_RELEASE_TABLET | Freq: Every day | ORAL | 3 refills | Status: DC

## 2018-10-26 NOTE — Telephone Encounter (Signed)
not  Controlled, but you need to find out what is the prefered drug as  her ins will not cover this , send me a message when you know and also call and let pt know why the change please

## 2018-10-26 NOTE — Telephone Encounter (Signed)
Tried to call patient no answer, no way to leave a message

## 2018-10-26 NOTE — Telephone Encounter (Signed)
Spoke with pharmacy he suggested Pantoprazole 20 or 40 mg

## 2018-10-26 NOTE — Telephone Encounter (Signed)
I have sent in protonicx 40 mg pls let her know

## 2018-10-30 ENCOUNTER — Encounter: Payer: Self-pay | Admitting: Family Medicine

## 2018-10-30 ENCOUNTER — Telehealth: Payer: Self-pay

## 2018-10-30 DIAGNOSIS — F332 Major depressive disorder, recurrent severe without psychotic features: Secondary | ICD-10-CM | POA: Insufficient documentation

## 2018-10-30 NOTE — Assessment & Plan Note (Signed)
Controlled, no change in medication DASH diet and commitment to daily physical activity for a minimum of 30 minutes discussed and encouraged, as a part of hypertension management. The importance of attaining a healthy weight is also discussed.  BP/Weight 10/24/2018 08/10/2018 07/31/2018 07/27/2018 03/01/2018 11/23/2017 84/57/3344  Systolic BP 830 159 968 957 022 026 691  Diastolic BP 80 80 84 70 80 64 92  Wt. (Lbs) 299.04 293.2 - 296 295 288 294.5  BMI 54.7 53.63 - 54.14 53.96 52.68 53.86  Some encounter information is confidential and restricted. Go to Review Flowsheets activity to see all data.

## 2018-10-30 NOTE — Progress Notes (Signed)
Holly Alvarado     MRN: [id number]      DOB: 02/17/1966   HPI Holly Alvarado is here for follow up and re-evaluation of chronic medical conditions, medication management and review of any available recent lab and radiology data.  Preventive health is updated, specifically  Cancer screening and Immunization.   Questions or concerns regarding consultations or procedures which the PT has had in the interim are  addressed. The PT denies any adverse reactions to current medications since the last visit.  Wants to try new medication for weight loss which has been successful with a friend. Did not tolerate phentermine caused palpitations ROS Denies recent fever or chills. Denies sinus pressure, nasal congestion, ear pain or sore throat. Denies chest congestion, productive cough or wheezing. Denies chest pains, palpitations and leg swelling Denies abdominal pain, nausea, vomiting,diarrhea or constipation.   Denies dysuria, frequency, hesitancy or incontinence. Denies joint pain, swelling and limitation in mobility. Denies headaches, seizures, numbness, or tingling. C/o  depression, anxiety and severe  Insomnia.Reports being awake every 90 to 120 minutes Denies skin break down or rash.   PE  BP 118/80   Pulse 79   Resp 12   Ht 5' 2"  (1.575 m)   Wt 299 lb 0.6 oz (135.6 kg)   SpO2 96% Comment: room air  BMI 54.70 kg/m   Patient alert and oriented and in no cardiopulmonary distress.  HEENT: No facial asymmetry, EOMI,   oropharynx pink and moist.  Neck supple no JVD, no mass.  Chest: Clear to auscultation bilaterally.  CVS: S1, S2 no murmurs, no S3.Regular rate.  ABD: Soft non tender.   Ext: No edema  MS: Adequate ROM spine, shoulders, hips and knees.  Skin: Intact, no ulcerations or rash noted.  Psych: Good eye contact, anxious and depressed affect. Memory intact .  CNS: CN 2-12 intact, power,  normal throughout.no focal deficits noted.   Assessment & Plan  Severe recurrent  major depression without psychotic features (Holly Alvarado) Uncontrolled, managed by Psych , will send message to Provider  GAD (generalized anxiety disorder) Uncontrolled, managed by Psychiatry , will send message also, has upcoming appt  Insomnia Sleep hygiene reviewed and written information offered also. Psychiatry to manage   Morbid obesity (Holly Alvarado) Deteriorated. Patient re-educated about  the importance of commitment to a  minimum of 150 minutes of exercise per week.  The importance of healthy food choices with portion control discussed. Encouraged to start a food diary, count calories and to consider  joining a support group. Sample diet sheets offered. Goals set by the patient for the next several months.   Weight /BMI 10/24/2018 08/10/2018 07/27/2018  WEIGHT 299 lb 0.6 oz 293 lb 3.2 oz 296 lb  HEIGHT 5' 2"  5' 2"  5' 2"   BMI 54.7 kg/m2 53.63 kg/m2 54.14 kg/m2  Some encounter information is confidential and restricted. Go to Review Flowsheets activity to see all data.    Will start saxenda if approved, f/u in  12 weeks  Essential hypertension Controlled, no change in medication DASH diet and commitment to daily physical activity for a minimum of 30 minutes discussed and encouraged, as a part of hypertension management. The importance of attaining a healthy weight is also discussed.  BP/Weight 10/24/2018 08/10/2018 07/31/2018 07/27/2018 03/01/2018 11/23/2017 80/99/8338  Systolic BP 250 539 767 341 937 902 409  Diastolic BP 80 80 84 70 80 64 92  Wt. (Lbs) 299.04 293.2 - 296 295 288 294.5  BMI 54.7 53.63 - 54.14  53.96 52.68 53.86  Some encounter information is confidential and restricted. Go to Review Flowsheets activity to see all data.

## 2018-10-30 NOTE — Assessment & Plan Note (Signed)
Uncontrolled, managed by Psychiatry , will send message also, has upcoming appt

## 2018-10-30 NOTE — Telephone Encounter (Signed)
-----   Message from [name], MD sent at 10/30/2018 11:44 AM EST ----- Regarding: saxenda approval and f/u appt If this was approved , her f/u needs to be changed to 3 months, either take care of this and explain tpo pt or ask front desk, thank you. If not approved pls  let me know

## 2018-10-30 NOTE — Telephone Encounter (Signed)
Patient needs follow up for Saxenda around 01/23/2019. Called to schedule no answer. VM full and unable to leave message. Will try again later. Mailed reminder with new appt

## 2018-10-30 NOTE — Assessment & Plan Note (Signed)
Deteriorated. Patient re-educated about  the importance of commitment to a  minimum of 150 minutes of exercise per week.  The importance of healthy food choices with portion control discussed. Encouraged to start a food diary, count calories and to consider  joining a support group. Sample diet sheets offered. Goals set by the patient for the next several months.   Weight /BMI 10/24/2018 08/10/2018 07/27/2018  WEIGHT 299 lb 0.6 oz 293 lb 3.2 oz 296 lb  HEIGHT 5' 2"  5' 2"  5' 2"   BMI 54.7 kg/m2 53.63 kg/m2 54.14 kg/m2  Some encounter information is confidential and restricted. Go to Review Flowsheets activity to see all data.    Will start saxenda if approved, f/u in  12 weeks

## 2018-10-30 NOTE — Assessment & Plan Note (Signed)
Uncontrolled, managed by Psych , will send message to Provider

## 2018-10-30 NOTE — Assessment & Plan Note (Signed)
Sleep hygiene reviewed and written information offered also. Psychiatry to manage

## 2018-10-31 ENCOUNTER — Other Ambulatory Visit: Payer: Self-pay | Admitting: Family Medicine

## 2018-10-31 ENCOUNTER — Telehealth (INDEPENDENT_AMBULATORY_CARE_PROVIDER_SITE_OTHER): Payer: Self-pay | Admitting: Internal Medicine

## 2018-10-31 NOTE — Telephone Encounter (Signed)
Patient came by office after seeing Dr Moshe Cipro - was told she had orders for blood work from back in December - patient wanted to know if she still needs to have this blood work done - ph# [phone]

## 2018-11-01 ENCOUNTER — Other Ambulatory Visit: Payer: Self-pay | Admitting: Family Medicine

## 2018-11-01 NOTE — Telephone Encounter (Signed)
Called patient at work was able to speak with her and informed her of the new medication that was called in

## 2018-11-02 LAB — COMPLETE METABOLIC PANEL WITH GFR
AG Ratio: 0.8 (calc) — ABNORMAL LOW (ref 1.0–2.5)
ALT: 46 U/L — ABNORMAL HIGH (ref 6–29)
AST: 92 U/L — ABNORMAL HIGH (ref 10–35)
Albumin: 3.2 g/dL — ABNORMAL LOW (ref 3.6–5.1)
Alkaline phosphatase (APISO): 138 U/L (ref 37–153)
BILIRUBIN TOTAL: 0.9 mg/dL (ref 0.2–1.2)
BUN: 7 mg/dL (ref 7–25)
CO2: 24 mmol/L (ref 20–32)
Calcium: 8.8 mg/dL (ref 8.6–10.4)
Chloride: 111 mmol/L — ABNORMAL HIGH (ref 98–110)
Creat: 0.67 mg/dL (ref 0.50–1.05)
GFR, Est African American: 117 mL/min/{1.73_m2} (ref >=60)
GFR, Est Non African American: 101 mL/min/{1.73_m2} (ref >=60)
Globulin: 4.2 g/dL (calc) — ABNORMAL HIGH (ref 1.9–3.7)
Glucose, Bld: 89 mg/dL (ref 65–99)
Potassium: 3.9 mmol/L (ref 3.5–5.3)
Sodium: 143 mmol/L (ref 135–146)
TOTAL PROTEIN: 7.4 g/dL (ref 6.1–8.1)

## 2018-11-02 LAB — HEMOGLOBIN A1C
Hgb A1c MFr Bld: 5.8 % of total Hgb — ABNORMAL HIGH (ref <5.7)
Mean Plasma Glucose: 120 (calc)
eAG (mmol/L): 6.6 (calc)

## 2018-11-02 LAB — VITAMIN B12: Vitamin B-12: 715 pg/mL (ref 200–1100)

## 2018-11-02 NOTE — Telephone Encounter (Signed)
Please let her know she needs to have the blood work

## 2018-11-03 ENCOUNTER — Encounter: Payer: Self-pay | Admitting: Family Medicine

## 2018-11-06 ENCOUNTER — Ambulatory Visit (HOSPITAL_COMMUNITY): Payer: BC Managed Care – PPO | Admitting: Psychiatry

## 2018-11-07 ENCOUNTER — Ambulatory Visit (HOSPITAL_COMMUNITY): Payer: BC Managed Care – PPO | Admitting: Psychiatry

## 2018-11-07 ENCOUNTER — Encounter (HOSPITAL_COMMUNITY): Payer: Self-pay | Admitting: Psychiatry

## 2018-11-07 DIAGNOSIS — F411 Generalized anxiety disorder: Secondary | ICD-10-CM
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
## 2018-11-15 ENCOUNTER — Other Ambulatory Visit (INDEPENDENT_AMBULATORY_CARE_PROVIDER_SITE_OTHER): Payer: Self-pay | Admitting: *Deleted

## 2018-11-15 DIAGNOSIS — R748 Abnormal levels of other serum enzymes: Secondary | ICD-10-CM

## 2018-11-15 LAB — IRON, TOTAL/TOTAL IRON BINDING CAP
%SAT: 50 % (calc) — ABNORMAL HIGH (ref 16–45)
Iron: 175 ug/dL — ABNORMAL HIGH (ref 45–160)
TIBC: 351 mcg/dL (calc) (ref 250–450)

## 2018-11-15 LAB — HEPATIC FUNCTION PANEL
AG Ratio: 0.8 (calc) — ABNORMAL LOW (ref 1.0–2.5)
ALBUMIN MSPROF: 3.4 g/dL — AB (ref 3.6–5.1)
ALKALINE PHOSPHATASE (APISO): 141 U/L (ref 37–153)
ALT: 41 U/L — ABNORMAL HIGH (ref 6–29)
AST: 92 U/L — AB (ref 10–35)
BILIRUBIN TOTAL: 0.8 mg/dL (ref 0.2–1.2)
Bilirubin, Direct: 0.3 mg/dL — ABNORMAL HIGH (ref 0.0–0.2)
Globulin: 4.2 g/dL (calc) — ABNORMAL HIGH (ref 1.9–3.7)
Indirect Bilirubin: 0.5 mg/dL (calc) (ref 0.2–1.2)
TOTAL PROTEIN: 7.6 g/dL (ref 6.1–8.1)

## 2018-11-15 LAB — ANTI-NUCLEAR AB-TITER (ANA TITER)
ANA TITER: 1:40 {titer} — ABNORMAL HIGH
ANA TITER: 1:80 {titer} — ABNORMAL HIGH
ANA Titer 1: 1:80 {titer} — ABNORMAL HIGH

## 2018-11-15 LAB — ANTI-SMOOTH MUSCLE ANTIBODY, IGG: Actin (Smooth Muscle) Antibody (IGG): <20 U (ref <20)

## 2018-11-15 LAB — CERULOPLASMIN: Ceruloplasmin: 27 mg/dL (ref 18–53)

## 2018-11-15 LAB — SEDIMENTATION RATE: Sed Rate: 14 mm/h (ref 0–30)

## 2018-11-15 LAB — MITOCHONDRIAL ANTIBODIES: Mitochondrial M2 Ab, IgG: <=20 U

## 2018-11-15 LAB — FERRITIN: FERRITIN: 254 ng/mL — AB (ref 16–232)

## 2018-11-15 LAB — ALPHA-1-ANTITRYPSIN: A-1 Antitrypsin, Ser: 107 mg/dL (ref 83–199)

## 2018-11-15 LAB — ANA: Anti Nuclear Antibody(ANA): POSITIVE — AB

## 2018-11-15 NOTE — Telephone Encounter (Signed)
pls let pt know medication requested for reflux is not cobered, if she wants alternative, then pls request alternative list from her pharmacy for me to review and chose one

## 2018-11-16 ENCOUNTER — Ambulatory Visit (INDEPENDENT_AMBULATORY_CARE_PROVIDER_SITE_OTHER): Payer: BC Managed Care – PPO | Admitting: Internal Medicine

## 2018-11-16 ENCOUNTER — Encounter (INDEPENDENT_AMBULATORY_CARE_PROVIDER_SITE_OTHER): Payer: Self-pay | Admitting: Internal Medicine

## 2018-11-16 VITALS — BP 141/85 | HR 76 | Temp 98.0°F | Resp 18 | Ht 62.0 in | Wt 298.2 lb

## 2018-11-16 DIAGNOSIS — R748 Abnormal levels of other serum enzymes: Secondary | ICD-10-CM

## 2018-11-16 NOTE — Patient Instructions (Signed)
OV pending.

## 2018-11-16 NOTE — Progress Notes (Signed)
Subjective:    Patient ID: Holly Alvarado, female    DOB: January 07, 1966, 53 y.o.   MRN: [id number] [phone] and ask for her.    HPI Here today for f/u. Last seen in November. Hx of elevated liver enzymes.  She tells me she is doing good.  Appetite is good. She has gained 5 pounds since her last visit.Has a BM every 3 days. She feels constipated. She has to take Miralax for this.  She was last seen by Dr. Laural Golden in 2016 for elevated liver enzymes.   Hepatic Function Latest Ref Rng & Units 11/13/2018 11/01/2018 08/17/2018  Total Protein 6.1 - 8.1 g/dL 7.6 7.4 7.7  Albumin 3.6 - 5.1 g/dL - - -  AST 10 - 35 U/L 92(H) 92(H) 104(H)  ALT 6 - 29 U/L 41(H) 46(H) 48(H)  Alk Phosphatase 33 - 130 U/L - - -  Total Bilirubin 0.2 - 1.2 mg/dL 0.8 0.9 0.7  Bilirubin, Direct 0.0 - 0.2 mg/dL 0.3(H) - 0.3(H)    SMA less than 20, sedrate 14, Iron 175, TIBC 351, % sat 50. Ceruloplasmin 27.  Mitochondrial IGG< less than 20. ANA positive, ANA titer 1:80, ANA pattern Nuclear, ANA pattern Cytoplasmic.  ANA pattern nuclear, speckled.  04/24/2015 Acute hepatitis panel negative.   08/17/2018 US abdomen with elast: F2 and F3.  Review of Systems Past Medical History:  Diagnosis Date  . Allergic rhinitis, seasonal   . Anemia    hx of   . Anxiety    hx of panic attacks and panic attacks with surgery   . Anxiety disorder   . Chronic constipation   . Complication of anesthesia    woke up during surgery of tubal ligation   . Esophageal polyp   . Fatty liver   . GERD (gastroesophageal reflux disease)   . Hemorrhoids   . Hypertension   . Obesity     Past Surgical History:  Procedure Laterality Date  . BLT  1987  . BREAST BIOPSY Right 2010   benign  . BREATH TEK H PYLORI N/A 02/25/2014   Procedure: BREATH TEK H PYLORI;  Surgeon: Pedro Earls, MD;  Location: [facility];  Service: General;  Laterality: N/A;  . CHOLECYSTECTOMY  1997  . COLONOSCOPY  12/30/2010  . COLONOSCOPY N/A 11/23/2017   Procedure:  COLONOSCOPY;  Surgeon: Rogene Houston, MD;  Location: [facility];  Service: Endoscopy;  Laterality: N/A;  1130  . [place]  . 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 MOUTH SURGERY     wisdom tooth extraction  . PARTIAL HYSTERECTOMY    . TOTAL ABDOMINAL HYSTERECTOMY  03/11/08   for fibroids   . TUBAL LIGATION    . UPPER GASTROINTESTINAL ENDOSCOPY  12/30/2010  . UPPER GASTROINTESTINAL ENDOSCOPY  05/11/04  . UPPER GASTROINTESTINAL ENDOSCOPY  04/30/97   ANWAR    Allergies  Allergen Reactions  . Codeine     Severe headache.   . Sulfonamide Derivatives Hives and Itching  . Tape     Foam tape - caused blisters and sever itching    Current Outpatient Medications on File Prior to Visit  Medication Sig Dispense Refill  . acetaminophen (TYLENOL) 500 MG tablet Take 500 mg by mouth every 6 (six) hours as needed.    . ALPRAZolam (XANAX) 0.5 MG tablet  Take 1 tablet (0.5 mg total) by mouth 2 (two) times daily as needed for anxiety. 60 tablet 2  . amLODipine (NORVASC) 2.5 MG tablet Take 1 tablet (2.5 mg total) by mouth daily. 90 tablet 1  . Calcium Carb-Cholecalciferol (CALTRATE 600+D3 SOFT) 600-800 MG-UNIT CHEW Chew 1 Piece by mouth daily.    . clotrimazole-betamethasone (LOTRISONE) cream Apply to ras(es)h twice daily for 10 days then as needed 45 g 1  . diphenhydrAMINE (BENADRYL) 50 MG capsule Take 50 mg by mouth as needed. Patient states that she will take 25 to 50 mg depending upon symptoms.    . fluticasone (FLONASE) 50 MCG/ACT nasal spray Place 2 sprays into both nostrils daily as needed for allergies or rhinitis.    Marland Kitchen loratadine (CLARITIN) 10 MG tablet Take 10 mg by mouth daily as needed for allergies.     . metoprolol tartrate (LOPRESSOR) 50 MG tablet TAKE 1 TABLET BY MOUTH TWICE A DAY 180 tablet 1  .  Multiple Vitamin (MULTIVITAMIN) capsule Take 2 capsules by mouth daily.     Marland Kitchen nystatin (MYCOSTATIN/NYSTOP) powder Apply to affected area twice daily for 10 days , then as needed 45 g 1  . pantoprazole (PROTONIX) 40 MG tablet Take 1 tablet (40 mg total) by mouth daily. 30 tablet 3  . Potassium 99 MG TABS Take 1 tablet by mouth daily.    . traZODone (DESYREL) 50 MG tablet TAKE 1 TABLET BY MOUTH EVERYDAY AT BEDTIME 90 tablet 2  . venlafaxine XR (EFFEXOR-XR) 150 MG 24 hr capsule Take 1 capsule (150 mg total) by mouth daily. 90 capsule 2  . vitamin B-12 (CYANOCOBALAMIN) 1000 MCG tablet Take 1,000 mcg by mouth daily.    . Vitamin D, Ergocalciferol, (DRISDOL) 1.25 MG (50000 UT) CAPS capsule TAKE 1 CAPSULE (50,000 UNITS TOTAL) BY MOUTH ONCE A WEEK. ONE CAPSULE ONCE WEEKLY 12 capsule 1   No current facility-administered medications on file prior to visit.         Objective:   Physical Exam Blood pressure (!) 141/85, pulse 76, temperature 98 F (36.7 C), temperature source Oral, resp. rate 18, height 5' 2"  (1.575 m), weight 298 lb 3.2 oz (135.3 kg). Alert and oriented. Skin warm and dry. Oral mucosa is moist.   . Sclera anicteric, conjunctivae is pink. Thyroid not enlarged. No cervical lymphadenopathy. Lungs clear. Heart regular rate and rhythm.  Abdomen is soft. Bowel sounds are positive. No hepatomegaly. No abdominal masses felt. No tenderness.  No edema to lower extremities.  Abdomen is obese        Assessment & Plan:  Elevated liver enzymes. Will repeat in 6 weeks. I am going to discuss with Dr. Laural Golden. Possible liver biopsy.

## 2018-11-20 NOTE — Telephone Encounter (Signed)
Called pharmacy to ask about alternatives to Nexium and Pharmacist stated that Protonix was ordered on Oct 26, 2018 and she had filled that for the patient.

## 2018-11-20 NOTE — Telephone Encounter (Signed)
Called patient, left a message

## 2018-11-22 ENCOUNTER — Ambulatory Visit (INDEPENDENT_AMBULATORY_CARE_PROVIDER_SITE_OTHER): Payer: BC Managed Care – PPO | Admitting: Psychiatry

## 2018-11-22 DIAGNOSIS — F411 Generalized anxiety disorder: Secondary | ICD-10-CM
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
## 2018-12-04 ENCOUNTER — Ambulatory Visit (HOSPITAL_COMMUNITY): Payer: BC Managed Care – PPO | Admitting: Psychiatry

## 2018-12-13 ENCOUNTER — Ambulatory Visit (HOSPITAL_COMMUNITY): Payer: BC Managed Care – PPO | Admitting: Psychiatry

## 2018-12-14 ENCOUNTER — Encounter (INDEPENDENT_AMBULATORY_CARE_PROVIDER_SITE_OTHER): Payer: Self-pay | Admitting: *Deleted

## 2018-12-14 ENCOUNTER — Other Ambulatory Visit (INDEPENDENT_AMBULATORY_CARE_PROVIDER_SITE_OTHER): Payer: Self-pay | Admitting: *Deleted

## 2018-12-14 DIAGNOSIS — R748 Abnormal levels of other serum enzymes: Secondary | ICD-10-CM

## 2018-12-27 ENCOUNTER — Ambulatory Visit (HOSPITAL_COMMUNITY): Payer: BC Managed Care – PPO | Admitting: Psychiatry

## 2018-12-27 ENCOUNTER — Telehealth (HOSPITAL_COMMUNITY): Payer: Self-pay | Admitting: Psychiatry

## 2018-12-27 ENCOUNTER — Other Ambulatory Visit: Payer: Self-pay

## 2018-12-27 NOTE — Telephone Encounter (Signed)
Left voice mail message for patient indicating attempt to contact for today's scheduled appointment.

## 2019-01-23 ENCOUNTER — Ambulatory Visit (INDEPENDENT_AMBULATORY_CARE_PROVIDER_SITE_OTHER): Payer: BC Managed Care – PPO | Admitting: Family Medicine

## 2019-01-23 ENCOUNTER — Other Ambulatory Visit: Payer: Self-pay

## 2019-01-23 ENCOUNTER — Encounter: Payer: Self-pay | Admitting: Family Medicine

## 2019-01-23 VITALS — BP 141/85 | HR 76 | Ht 62.0 in | Wt 298.0 lb

## 2019-01-23 DIAGNOSIS — F418 Other specified anxiety disorders: Secondary | ICD-10-CM

## 2019-01-23 DIAGNOSIS — M5442 Lumbago with sciatica, left side: Secondary | ICD-10-CM

## 2019-01-23 DIAGNOSIS — G8929 Other chronic pain: Secondary | ICD-10-CM

## 2019-01-23 DIAGNOSIS — R3915 Urgency of urination: Secondary | ICD-10-CM

## 2019-01-23 DIAGNOSIS — I10 Essential (primary) hypertension: Secondary | ICD-10-CM

## 2019-01-23 DIAGNOSIS — Z13220 Encounter for screening for lipoid disorders: Secondary | ICD-10-CM

## 2019-01-23 DIAGNOSIS — R7303 Prediabetes: Secondary | ICD-10-CM

## 2019-01-23 DIAGNOSIS — E559 Vitamin D deficiency, unspecified: Secondary | ICD-10-CM

## 2019-01-23 DIAGNOSIS — K219 Gastro-esophageal reflux disease without esophagitis: Secondary | ICD-10-CM

## 2019-01-23 DIAGNOSIS — E8881 Metabolic syndrome: Secondary | ICD-10-CM

## 2019-01-23 MED ORDER — METHOCARBAMOL 750 MG PO TABS: ORAL_TABLET | ORAL | 1 refills | Status: DC

## 2019-01-23 NOTE — Progress Notes (Signed)
Virtual Visit via Telephone Note  I connected with Holly Alvarado on 01/23/19 at  2:20 PM EDT by telephone and verified that I am speaking with the correct person using two identifiers.   I discussed the limitations, risks, security and privacy concerns of performing an evaluation and management service by telephone and the availability of in person appointments. I also discussed with the patient that there may be a patient responsible charge related to this service. The patient expressed understanding and agreed to proceed. Pt is In her home and I am in the office  History of Present Illness: Malodorous urine with leaking x 2 weeks,no flank pain , fever or chills, no Nausea or vomiting Has non tender area of concern down in incision site, will address in May with Surgeon Denies recent fever or chills. Denies sinus pressure, nasal congestion, ear pain or sore throat. Denies chest congestion, productive cough or wheezing. Denies chest pains, palpitations and leg swelling Denies abdominal pain, nausea, vomiting,diarrhea or constipation.    Denies joint pain, swelling and limitation in mobility. Denies headaches, seizures, numbness, or tingling. Denies suicidal or homicidal ideation, but does report   Uncontrolled depression and anxiety denies  insomnia. .       Observations/Objective: BP (!) 141/85   Pulse 76   Ht 5' 2"  (1.575 m)   Wt 298 lb (135.2 kg)   BMI 54.50 kg/m  Good communication with no confusion and intact memory. Alert and oriented x 3 No signs of respiratory distress during sppech    Assessment and Plan:  Urinary urgency 2 week h/o with ,maladorous urine, thi needs testing before antibiotic is prescribed  Morbid obesity (HCC) Unchanged.  Patient re-educated about  the importance of commitment to a  minimum of 150 minutes of exercise per week as able.  The importance of healthy food choices with portion control discussed, as well as eating regularly and  within a 12 hour window most days. The need to choose "clean , green" food 50 to 75% of the time is discussed, as well as to make water the primary drink and set a goal of 64 ounces water daily.  Encouraged to start a food diary,  and to consider  joining a support group. Sample diet sheets offered. Goals set by the patient for the next several months.   Weight /BMI 01/23/2019 11/16/2018 10/24/2018  WEIGHT 298 lb 298 lb 3.2 oz 299 lb 0.6 oz  HEIGHT 5' 2"  5' 2"  5' 2"   BMI 54.5 kg/m2 54.54 kg/m2 54.7 kg/m2  Some encounter information is confidential and restricted. Go to Review Flowsheets activity to see all data.      Depression with anxiety Increased and uncontrolled, needs to ree establish with Psych  Essential hypertension Controlled, no change in medication DASH diet and commitment to daily physical activity for a minimum of 30 minutes discussed and encouraged, as a part of hypertension management. The importance of attaining a healthy weight is also discussed.  BP/Weight 01/23/2019 11/16/2018 10/24/2018 08/10/2018 07/31/2018 18/29/9371 03/05/6788  Systolic BP 381 017 510 258 527 782 423  Diastolic BP 85 85 80 80 84 70 80  Wt. (Lbs) 298 298.2 299.04 293.2 - 296 295  BMI 54.5 54.54 54.7 53.63 - 54.14 53.96  Some encounter information is confidential and restricted. Go to Review Flowsheets activity to see all data.       GERD Controlled, no change in medication    Follow Up Instructions:    I discussed the assessment and  treatment plan with the patient. The patient was provided an opportunity to ask questions and all were answered. The patient agreed with the plan and demonstrated an understanding of the instructions.   The patient was advised to call back or seek an in-person evaluation if the symptoms worsen or if the condition fails to improve as anticipated.  I provided  25 minutes of non-face-to-face time during this encounter.   [name], MD

## 2019-01-23 NOTE — Patient Instructions (Addendum)
F/u in early September, call if you need me before, shingles vacine at that visit  Fasting  Lipid, cmp and eGFr, hBA1C, CBC, vit D and TSH 1 week before your September visit  Please go to lab next door tomorrow and submit urine for testing for infection (([facility] and reflex C/S, [facility])  Please have the surgeon examine your area of concern when you go to your appointment next month  Please call and reschedule your follow up with Psychiatry , as you say , you need to , and want to keep this going!  Good that you are pushing yourself and have started walking around the track keep it up!  Think about what you will eat, plan ahead. Choose " clean, green, fresh or frozen" over canned, processed or packaged foods which are more sugary, salty and fatty. 70 to 75% of food eaten should be vegetables and fruit. Three meals at set times with snacks allowed between meals, but they must be fruit or vegetables. Aim to eat over a 12 hour period , example 7 am to 7 pm, and STOP after  your last meal of the day. Drink water,generally about 64 ounces per day, no other drink is as healthy. Fruit juice is best enjoyed in a healthy way, by EATING the fruit.   Social distancing. Frequent hand washing with soap and water Keeping your hands off of your face.wear a mask when outside of your home These 3 practices will help to keep both you and your community healthy during this time. Please practice them faithfully!  Thanks for choosing [facility], we consider it a privelige to serve you.

## 2019-01-25 LAB — URINALYSIS W MICROSCOPIC + REFLEX CULTURE
Bilirubin Urine: NEGATIVE
Glucose, UA: NEGATIVE
Hgb urine dipstick: NEGATIVE
Hyaline Cast: NONE SEEN /LPF
Ketones, ur: NEGATIVE
Leukocyte Esterase: NEGATIVE
Nitrites, Initial: NEGATIVE
Protein, ur: NEGATIVE
RBC / HPF: NONE SEEN /HPF (ref 0–2)
Specific Gravity, Urine: 1.016 (ref 1.001–1.03)
pH: 6.5 (ref 5.0–8.0)

## 2019-01-25 LAB — NO CULTURE INDICATED

## 2019-01-27 ENCOUNTER — Encounter: Payer: Self-pay | Admitting: Family Medicine

## 2019-01-27 DIAGNOSIS — R3915 Urgency of urination: Secondary | ICD-10-CM | POA: Insufficient documentation

## 2019-01-27 NOTE — Assessment & Plan Note (Signed)
Unchanged.  Patient re-educated about  the importance of commitment to a  minimum of 150 minutes of exercise per week as able.  The importance of healthy food choices with portion control discussed, as well as eating regularly and within a 12 hour window most days. The need to choose "clean , green" food 50 to 75% of the time is discussed, as well as to make water the primary drink and set a goal of 64 ounces water daily.  Encouraged to start a food diary,  and to consider  joining a support group. Sample diet sheets offered. Goals set by the patient for the next several months.   Weight /BMI 01/23/2019 11/16/2018 10/24/2018  WEIGHT 298 lb 298 lb 3.2 oz 299 lb 0.6 oz  HEIGHT 5' 2"  5' 2"  5' 2"   BMI 54.5 kg/m2 54.54 kg/m2 54.7 kg/m2  Some encounter information is confidential and restricted. Go to Review Flowsheets activity to see all data.

## 2019-01-27 NOTE — Assessment & Plan Note (Signed)
Controlled, no change in medication DASH diet and commitment to daily physical activity for a minimum of 30 minutes discussed and encouraged, as a part of hypertension management. The importance of attaining a healthy weight is also discussed.  BP/Weight 01/23/2019 11/16/2018 10/24/2018 08/10/2018 07/31/2018 40/69/8614 04/29/734  Systolic BP 430 148 403 979 536 922 300  Diastolic BP 85 85 80 80 84 70 80  Wt. (Lbs) 298 298.2 299.04 293.2 - 296 295  BMI 54.5 54.54 54.7 53.63 - 54.14 53.96  Some encounter information is confidential and restricted. Go to Review Flowsheets activity to see all data.

## 2019-01-27 NOTE — Assessment & Plan Note (Signed)
Increased and uncontrolled, needs to ree establish with Psych

## 2019-01-27 NOTE — Assessment & Plan Note (Signed)
Controlled, no change in medication  

## 2019-01-27 NOTE — Assessment & Plan Note (Signed)
2 week h/o with ,maladorous urine, thi needs testing before antibiotic is prescribed

## 2019-02-01 ENCOUNTER — Encounter: Payer: Self-pay | Admitting: Family Medicine

## 2019-02-01 ENCOUNTER — Ambulatory Visit (INDEPENDENT_AMBULATORY_CARE_PROVIDER_SITE_OTHER): Payer: BC Managed Care – PPO | Admitting: Family Medicine

## 2019-02-01 VITALS — BP 141/85 | Ht 62.0 in | Wt 298.0 lb

## 2019-02-01 DIAGNOSIS — R062 Wheezing: Secondary | ICD-10-CM

## 2019-02-01 DIAGNOSIS — J0140 Acute pansinusitis, unspecified: Secondary | ICD-10-CM | POA: Diagnosis not present

## 2019-02-01 MED ORDER — AZITHROMYCIN 250 MG PO TABS: ORAL_TABLET | ORAL | 0 refills | Status: DC

## 2019-02-01 MED ORDER — ALBUTEROL SULFATE HFA 108 (90 BASE) MCG/ACT IN AERS: 2.0000 | INHALATION_SPRAY | Freq: Four times a day (QID) | RESPIRATORY_TRACT | 2 refills | Status: DC | PRN

## 2019-02-01 NOTE — Patient Instructions (Signed)
    Thank you for completing your visit via telemedicine. I appreciate the opportunity to provide you with the care for your health and wellness. Today we discussed: Sinusitis, allergies, wheezing and cough  Medications sent in include albuterol inhaler and Z-Pak  Albuterol inhaler is to be utilized 2 puffs daily into the lungs as needed every 6 hours.  For wheezing and shortness of breath  The Z-Pak is to be taking as directed.  Please take 2 pills today.  And 1 pill for each additional day for the next 4 days.  Please complete the course and its full.  Please stay hydrated as well to help thin out your secretions.  And consider Mucinex to help with coughing up mucus. please see below for more information on ways to help with symptom management   Please take the prescribed medication as directed and complete the full dose to prevent reinfection.  As suggested for symptom management use Nasal saline spray to help ease congestion and dry nasal passages. Can be used throughout the day as needed. Avoid forceful blowing of nose. It will be common to have some blood if blowing nose a lot or if nose is very dry.  Please utilize your inhaler as needed for wheezing, shortness of breath, uncontrolled cough.  Can you a humidifier at home as well. Remember to wash and air it out regularly. Tylenol (325 mg 2 tablets every 6 hours) and or Ibuprofen (200- 400 mg every 8 hours) for fever, sore throat and body aches.  Can consider use of a Neti-pot to wash out sinus cavity (twice daily). Please be aware that you need to use distilled or boiled water for these.  If congestion is increasing and or coughing can use Mucinex (twice daily) for cough and congestion with full glass of water.   If you have a sore throat warm fluids like hot tea or lemon water can help sooth this.   Please continue to practice social distancing during this time to keep you in our community safe.  Please hydrate, rest, get  fresh air daily and wash your hands well. I hope you feel better soon.  Call in 3-5 days if symptoms persist/worsen and you have completed your full course of medication.  [place] YOUR HANDS WELL AND FREQUENTLY. AVOID TOUCHING YOUR FACE, UNLESS YOUR HANDS ARE FRESHLY WASHED.  GET FRESH AIR DAILY. STAY HYDRATED WITH WATER.   It was a pleasure to see you and I look forward to continuing to work together on your health and well-being. Please do not hesitate to call the office if you need care or have questions about your care.  Have a wonderful day and week. With Gratitude, Cherly Beach, DNP, AGNP-BC

## 2019-02-01 NOTE — Progress Notes (Signed)
Virtual Visit via Telephone Note   This visit type was conducted due to national recommendations for restrictions regarding the COVID-19 Pandemic (e.g. social distancing) in an effort to limit this patient's exposure and mitigate transmission in our community.  Due to her co-morbid illnesses, this patient is at least at moderate risk for complications without adequate follow up.  This format is felt to be most appropriate for this patient at this time.  The patient did not have access to video technology/had technical difficulties with video requiring transitioning to audio format only (telephone).  All issues noted in this document were discussed and addressed.  No physical exam could be performed with this format.    Evaluation Performed:  Follow-up visit  Date:  02/01/2019   ID:  Holly Alvarado, Holly Alvarado 07/10/1966, MRN [id number]  Patient Location: Home Provider Location: Other:  telemedicine  Location of Patient: Home Location of Provider: Telehealth Consent was obtain for visit to be over via telehealth. I verified that I am speaking with the correct person using two identifiers.  PCP:  [name], MD   Chief Complaint:  Sinus/allergy symptoms   History of Present Illness:    Holly Alvarado is a 53 y.o. female patient of Dr Moshe Cipro. History of allergies, bronchitis, and sinusitis in the past.  Onset was about 6 days ago started with a headache and you had a headache every day since. You have some thick postnasal drip and drainage (yellow/green). Wheezing started last night along with a cough.  Reports that she has taken Tylenol extra strength, Claritin, Flonase to help manage with her allergies.  But this seems to be getting worse.  Denies having any recent contacts or been around anybody that has COVID or signs of COVID.  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  Reports that at times through the year yearly she will get allergies  that will be really bad that would cause her to have bronchitis or sinusitis.  Reports she has had a slight low-grade fever of 99 off and on for last couple days.  Reports that she feels okay predominantly through the day and starts getting worse in the afternoon to evening.  Denies having any chest pain, shortness of breath, chest tightness.  Denies having any palpitations, leg swelling.  Past Medical, Surgical, Social History, Allergies, and Medications have been Reviewed.   Past Medical History:  Diagnosis Date  . Allergic rhinitis, seasonal   . Anemia    hx of   . Anxiety    hx of panic attacks and panic attacks with surgery   . Anxiety disorder   . Chronic constipation   . Complication of anesthesia    woke up during surgery of tubal ligation   . Esophageal polyp   . Fatty liver   . GERD (gastroesophageal reflux disease)   . Hemorrhoids   . Hypertension   . Obesity    Past Surgical History:  Procedure Laterality Date  . BLT  1987  . BREAST BIOPSY Right 2010   benign  . BREATH TEK H PYLORI N/A 02/25/2014   Procedure: BREATH TEK H PYLORI;  Surgeon: Pedro Earls, MD;  Location: [facility];  Service: General;  Laterality: N/A;  . CHOLECYSTECTOMY  1997  . COLONOSCOPY  12/30/2010  . COLONOSCOPY N/A 11/23/2017   Procedure: COLONOSCOPY;  Surgeon: Rogene Houston, MD;  Location: [facility];  Service: Endoscopy;  Laterality: N/A;  [place]  . 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 MOUTH SURGERY     wisdom tooth extraction  . PARTIAL HYSTERECTOMY    . TOTAL ABDOMINAL HYSTERECTOMY  03/11/08   for fibroids   . TUBAL LIGATION    . UPPER GASTROINTESTINAL ENDOSCOPY  12/30/2010  . UPPER GASTROINTESTINAL ENDOSCOPY  05/11/04  . UPPER GASTROINTESTINAL ENDOSCOPY  04/30/97   ANWAR      Current Meds  Medication Sig  . acetaminophen (TYLENOL) 500 MG tablet Take 500 mg by mouth every 6 (six) hours as needed.  . ALPRAZolam (XANAX) 0.5 MG tablet Take 1 tablet (0.5 mg total) by mouth 2 (two) times daily as needed for anxiety.  Marland Kitchen amLODipine (NORVASC) 2.5 MG tablet Take 1 tablet (2.5 mg total) by mouth daily.  . Calcium Carb-Cholecalciferol (CALTRATE 600+D3 SOFT) 600-800 MG-UNIT CHEW Chew 1 Piece by mouth daily.  . clotrimazole-betamethasone (LOTRISONE) cream Apply to ras(es)h twice daily for 10 days then as needed  . diphenhydrAMINE (BENADRYL) 50 MG capsule Take 50 mg by mouth as needed. Patient states that she will take 25 to 50 mg depending upon symptoms.  . fluticasone (FLONASE) 50 MCG/ACT nasal spray Place 2 sprays into both nostrils daily as needed for allergies or rhinitis.  Marland Kitchen loratadine (CLARITIN) 10 MG tablet Take 10 mg by mouth daily as needed for allergies.   . methocarbamol (ROBAXIN) 750 MG tablet Take on tablet once daily as needed, for back spasm  . metoprolol tartrate (LOPRESSOR) 50 MG tablet TAKE 1 TABLET BY MOUTH TWICE A DAY  . Multiple Vitamin (MULTIVITAMIN) capsule Take 2 capsules by mouth daily.   Marland Kitchen nystatin (MYCOSTATIN/NYSTOP) powder Apply to affected area twice daily for 10 days , then as needed  . pantoprazole (PROTONIX) 40 MG tablet Take 1 tablet (40 mg total) by mouth daily.  . Potassium 99 MG TABS Take 1 tablet by mouth daily.  . traZODone (DESYREL) 50 MG tablet TAKE 1 TABLET BY MOUTH EVERYDAY AT BEDTIME  . venlafaxine XR (EFFEXOR-XR) 150 MG 24 hr capsule Take 1 capsule (150 mg total) by mouth daily.  . vitamin B-12 (CYANOCOBALAMIN) 1000 MCG tablet Take 1,000 mcg by mouth daily.  . Vitamin D, Ergocalciferol, (DRISDOL) 1.25 MG (50000 UT) CAPS capsule TAKE 1 CAPSULE (50,000 UNITS TOTAL) BY MOUTH ONCE A WEEK. ONE CAPSULE ONCE WEEKLY     Allergies:   Codeine; Sulfonamide derivatives; and Tape   Social History   Tobacco Use  . Smoking status: Never  Smoker  . Smokeless tobacco: Never Used  Substance Use Topics  . Alcohol use: No    Alcohol/week: 0.0 standard drinks  . Drug use: No     Family Hx: The patient's family history includes Alcohol abuse in her father; Arthritis in an other family member; Asthma in an other family member; COPD in her mother; Cancer in an other family member; Colon cancer in her maternal aunt, maternal aunt, maternal aunt, maternal aunt, maternal aunt, and maternal aunt; Depression in her father and mother; Diabetes in an other family member; Drug abuse in her father; Heart disease in an other family member; Hyperlipidemia in her father, sister, and sister; Hypertension in her father, mother, sister, and sister; Schizophrenia in her maternal uncle; Stroke in an other family member.  ROS:   Please see the history of  present illness.    Review of Systems  Constitutional: Positive for chills and fever.  HENT: Positive for congestion, sinus pain and sore throat. Negative for ear discharge.   Eyes:       Pressure behind eye  Respiratory: Positive for cough, sputum production and wheezing.   Cardiovascular: Negative for chest pain and leg swelling.  Gastrointestinal: Negative.   Genitourinary: Negative.   Musculoskeletal: Negative.   Neurological: Positive for headaches.  Endo/Heme/Allergies: Negative.   Psychiatric/Behavioral: Negative.   All other systems reviewed and are negative.  All other systems reviewed and are negative.   Labs/Other Tests and Data Reviewed:    Recent Labs: 03/08/2018: TSH 0.69 08/17/2018: Hemoglobin 13.4; Platelets 192 11/01/2018: BUN 7; Creat 0.67; Potassium 3.9; Sodium 143 11/13/2018: ALT 41   Recent Lipid Panel Lab Results  Component Value Date/Time   CHOL 144 07/27/2018 09:09 AM   TRIG 149 07/27/2018 09:09 AM   HDL 28 (L) 07/27/2018 09:09 AM   CHOLHDL 5.1 (H) 07/27/2018 09:09 AM   LDLCALC 91 07/27/2018 09:09 AM    Wt Readings from Last 3 Encounters:  02/01/19 298 lb  (135.2 kg)  01/23/19 298 lb (135.2 kg)  11/16/18 298 lb 3.2 oz (135.3 kg)     Objective:    Vital Signs:  BP (!) 141/85   Ht 5' 2"  (1.575 m)   Wt 298 lb (135.2 kg)   BMI 54.50 kg/m    VITAL SIGNS:  reviewed GEN:  Alert and oriented RESPIRATORY:  No noted shortness of breath during conversation PSYCH:  Normal affect, mood, memory intact, good communication throughout conversation  ASSESSMENT & PLAN:    1. Wheezing New onset wheezing secondary to start of sinusitis, bronchitis. Educated on the use of albuterol inhaler.  Has used 1 of these in the past.  And found it beneficial.  Educated on the signs and symptoms of COVID maintaining herself at home and social distancing.  As well as the signs and symptoms to look for that she should take herself to the emergency room or call 911.  Reviewed side effects, risks and benefits of medication.  Patient acknowledged agreement and understanding of the plan.   - albuterol (VENTOLIN HFA) 108 (90 Base) MCG/ACT inhaler; Inhale 2 puffs into the lungs every 6 (six) hours as needed for wheezing or shortness of breath.  Dispense: 1 Inhaler; Refill: 2  2. Acute non-recurrent pansinusitis Signs and symptoms today are consistent with pansinusitis most likely contributory to ongoing allergy even with allergy medication.  History of sinusitis secondary to significant allergies.  Has been taking her allergy medication and getting worse.  Will cover her for sinusitis and possible otitis media as her ear is providing her with pain.  Reviewed side effects, risks and benefits of medication.   Patient acknowledged agreement and understanding of the plan.   - azithromycin (ZITHROMAX Z-PAK) 250 MG tablet; Take 2 pills on Day 1, and take 1 pill on days 2-5. Please complete the full course  Dispense: 6 each; Refill: 0   Time:   Today, I have spent 10 minutes with the patient with telehealth technology discussing the above problems.     Medication  Adjustments/Labs and Tests Ordered: Current medicines are reviewed at length with the patient today.  Concerns regarding medicines are outlined above.   Tests Ordered: No orders of the defined types were placed in this encounter.   Medication Changes: Meds ordered this encounter  Medications  . albuterol (VENTOLIN HFA) 108 (90 Base) MCG/ACT  inhaler    Sig: Inhale 2 puffs into the lungs every 6 (six) hours as needed for wheezing or shortness of breath.    Dispense:  1 Inhaler    Refill:  2    Order Specific Question:   Supervising Provider    Answer:   SIMPSON, MARGARET E [[id number]]  . azithromycin (ZITHROMAX Z-PAK) 250 MG tablet    Sig: Take 2 pills on Day 1, and take 1 pill on days 2-5. Please complete the full course    Dispense:  6 each    Refill:  0    Order Specific Question:   Supervising Provider    Answer:   [name] [[id number]]    Disposition:  Follow up prn  Signed, Perlie Mayo, NP  02/01/2019 10:57 AM     [place]

## 2019-02-16 ENCOUNTER — Other Ambulatory Visit: Payer: Self-pay | Admitting: Family Medicine

## 2019-02-22 ENCOUNTER — Ambulatory Visit: Payer: Self-pay | Admitting: Family Medicine

## 2019-03-15 ENCOUNTER — Other Ambulatory Visit (HOSPITAL_COMMUNITY): Payer: Self-pay | Admitting: Psychiatry

## 2019-03-15 NOTE — Telephone Encounter (Signed)
Patient not seen since Nov, please schedule

## 2019-03-15 NOTE — Telephone Encounter (Signed)
6/18 UNABLE TO LEAVE V.M. MAIL BOX IS FULL ALSO HAS MISSED 2 APPTS WITH THERAPY PEGGY B

## 2019-03-15 NOTE — Telephone Encounter (Signed)
Please send her a letter telling her she will be discharged if no response

## 2019-04-03 ENCOUNTER — Telehealth (HOSPITAL_COMMUNITY): Payer: Self-pay | Admitting: Psychiatry

## 2019-04-03 NOTE — Telephone Encounter (Signed)
04/03/19 Letter sent due to not able to reach patient by phone - sent letter certified mail #[phone].Marland KitchenMariana Alvarado

## 2019-04-19 DIAGNOSIS — M545 Low back pain, unspecified: Secondary | ICD-10-CM | POA: Insufficient documentation

## 2019-04-26 ENCOUNTER — Other Ambulatory Visit: Payer: Self-pay

## 2019-04-26 ENCOUNTER — Ambulatory Visit (HOSPITAL_COMMUNITY): Payer: BC Managed Care – PPO | Admitting: Psychiatry

## 2019-04-26 DIAGNOSIS — F411 Generalized anxiety disorder: Secondary | ICD-10-CM
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
## 2019-04-27 ENCOUNTER — Ambulatory Visit (INDEPENDENT_AMBULATORY_CARE_PROVIDER_SITE_OTHER): Payer: BC Managed Care – PPO | Admitting: Psychiatry

## 2019-04-27 ENCOUNTER — Encounter (HOSPITAL_COMMUNITY): Payer: Self-pay | Admitting: Psychiatry

## 2019-04-27 ENCOUNTER — Other Ambulatory Visit: Payer: Self-pay

## 2019-04-27 DIAGNOSIS — F411 Generalized anxiety disorder: Secondary | ICD-10-CM

## 2019-04-27 MED ORDER — VENLAFAXINE HCL ER 150 MG PO CP24: 150.0000 mg | ORAL_CAPSULE | Freq: Every day | ORAL | 2 refills | Status: DC

## 2019-04-27 MED ORDER — ALPRAZOLAM 0.5 MG PO TABS: 0.5000 mg | ORAL_TABLET | Freq: Two times a day (BID) | ORAL | 2 refills | Status: DC | PRN

## 2019-04-27 MED ORDER — VENLAFAXINE HCL ER 75 MG PO CP24: 75.0000 mg | ORAL_CAPSULE | Freq: Every day | ORAL | 2 refills | Status: DC

## 2019-04-27 MED ORDER — TRAZODONE HCL 50 MG PO TABS: ORAL_TABLET | ORAL | 2 refills | Status: DC
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
## 2019-05-11 ENCOUNTER — Other Ambulatory Visit: Payer: Self-pay | Admitting: Family Medicine

## 2019-05-14 ENCOUNTER — Encounter: Payer: Self-pay | Admitting: *Deleted

## 2019-05-21 ENCOUNTER — Ambulatory Visit: Payer: BC Managed Care – PPO | Admitting: Family Medicine

## 2019-05-28 ENCOUNTER — Other Ambulatory Visit: Payer: Self-pay

## 2019-05-28 ENCOUNTER — Ambulatory Visit: Payer: BC Managed Care – PPO

## 2019-05-29 ENCOUNTER — Ambulatory Visit: Payer: BC Managed Care – PPO | Admitting: Family Medicine

## 2019-06-06 ENCOUNTER — Ambulatory Visit (HOSPITAL_COMMUNITY): Payer: BC Managed Care – PPO | Admitting: Psychiatry

## 2019-06-06 ENCOUNTER — Other Ambulatory Visit: Payer: Self-pay

## 2019-06-14 ENCOUNTER — Other Ambulatory Visit: Payer: Self-pay | Admitting: *Deleted

## 2019-06-14 DIAGNOSIS — Z20822 Contact with and (suspected) exposure to covid-19: Secondary | ICD-10-CM

## 2019-06-15 LAB — NOVEL CORONAVIRUS, NAA: SARS-CoV-2, NAA: NOT DETECTED

## 2019-06-19 ENCOUNTER — Telehealth (HOSPITAL_COMMUNITY): Payer: Self-pay | Admitting: Psychiatry

## 2019-06-20 ENCOUNTER — Ambulatory Visit (HOSPITAL_COMMUNITY): Payer: BC Managed Care – PPO | Admitting: Psychiatry

## 2019-06-20 NOTE — Telephone Encounter (Signed)
Opened in error

## 2019-06-21 ENCOUNTER — Encounter (HOSPITAL_COMMUNITY): Payer: Self-pay | Admitting: Psychiatry

## 2019-06-21 NOTE — Progress Notes (Signed)
Outpatient Therapist Discharge Summary  Holly Alvarado    June 18, 1966   Admission Date: 07/01/2016 Discharge Date:  06/07/2019 Reason for Discharge:  Treatment noncompliance, excessive no shows/cancellations  Diagnosis:  Axis I:  GAD   Keni Wafer E Tycen Dockter

## 2019-06-28 ENCOUNTER — Ambulatory Visit: Payer: BC Managed Care – PPO | Admitting: Family Medicine

## 2019-07-04 ENCOUNTER — Ambulatory Visit (HOSPITAL_COMMUNITY): Payer: BC Managed Care – PPO | Admitting: Psychiatry

## 2019-07-18 ENCOUNTER — Ambulatory Visit (HOSPITAL_COMMUNITY): Payer: BC Managed Care – PPO | Admitting: Psychiatry

## 2019-07-25 ENCOUNTER — Other Ambulatory Visit: Payer: Self-pay

## 2019-07-25 ENCOUNTER — Ambulatory Visit (HOSPITAL_COMMUNITY): Payer: BC Managed Care – PPO | Admitting: Psychiatry

## 2019-08-01 ENCOUNTER — Ambulatory Visit (INDEPENDENT_AMBULATORY_CARE_PROVIDER_SITE_OTHER): Payer: BC Managed Care – PPO | Admitting: Family Medicine

## 2019-08-01 ENCOUNTER — Other Ambulatory Visit: Payer: Self-pay

## 2019-08-01 ENCOUNTER — Encounter: Payer: Self-pay | Admitting: Family Medicine

## 2019-08-01 DIAGNOSIS — J3089 Other allergic rhinitis: Secondary | ICD-10-CM | POA: Diagnosis not present

## 2019-08-01 DIAGNOSIS — Z23 Encounter for immunization: Secondary | ICD-10-CM | POA: Diagnosis not present

## 2019-08-01 DIAGNOSIS — R062 Wheezing: Secondary | ICD-10-CM

## 2019-08-01 DIAGNOSIS — I10 Essential (primary) hypertension: Secondary | ICD-10-CM

## 2019-08-01 DIAGNOSIS — W19XXXA Unspecified fall, initial encounter: Secondary | ICD-10-CM

## 2019-08-01 DIAGNOSIS — B369 Superficial mycosis, unspecified: Secondary | ICD-10-CM

## 2019-08-01 DIAGNOSIS — K219 Gastro-esophageal reflux disease without esophagitis: Secondary | ICD-10-CM

## 2019-08-01 MED ORDER — PANTOPRAZOLE SODIUM 40 MG PO TBEC: 40.0000 mg | DELAYED_RELEASE_TABLET | Freq: Every day | ORAL | 1 refills | Status: DC

## 2019-08-01 MED ORDER — CLOTRIMAZOLE-BETAMETHASONE 1-0.05 % EX CREA: TOPICAL_CREAM | CUTANEOUS | 1 refills | Status: DC

## 2019-08-01 MED ORDER — FLUTICASONE PROPIONATE 50 MCG/ACT NA SUSP: 2.0000 | Freq: Every day | NASAL | 3 refills | Status: DC | PRN

## 2019-08-01 MED ORDER — ALBUTEROL SULFATE HFA 108 (90 BASE) MCG/ACT IN AERS: 2.0000 | INHALATION_SPRAY | Freq: Four times a day (QID) | RESPIRATORY_TRACT | 1 refills | Status: DC | PRN

## 2019-08-01 MED ORDER — LEVOCETIRIZINE DIHYDROCHLORIDE 5 MG PO TABS: 5.0000 mg | ORAL_TABLET | Freq: Every evening | ORAL | 0 refills | Status: DC

## 2019-08-01 NOTE — Patient Instructions (Addendum)
  I appreciate the opportunity to provide you with the care for your health and wellness. Today we discussed:   Follow up: 6 months   No labs or referrals today  Tylenol for sore arm due to flu shot.  Start taking Xyzal instead of Claritin. Use inhaler daily before walking long distance or before exertion.  Work to walk daily 30-60 minutes at a time. Remember to eat a well balanced diet and avoid fried, fatty, and high sugar foods. Losing some weight can help with breathing.  We hope you have a happy, safe, and healthy Holiday Season!  See you in the New Year, call if you need [place] before next appt.  Please continue to practice social distancing to keep you, your family, and our community safe.  If you must go out, please wear a mask and practice good handwashing.  It was a pleasure to see you and I look forward to continuing to work together on your health and well-being. Please do not hesitate to call the office if you need care or have questions about your care.  Have a wonderful day and week. With Gratitude, Cherly Beach, DNP, AGNP-BC

## 2019-08-01 NOTE — Progress Notes (Signed)
Subjective:     Patient ID: Holly Alvarado, female   DOB: [birth year], 53 y.o.   MRN: [id number]  Holly Alvarado presents for Wheezing (has been having wheezing on exertion since she had bronchitis back in May but the wheezing never went away. Only notices it on exertion and sometimes feels like she can't get a good breath. Has to walk long distances to class and gets SOB sometimes ) and Fall (fell 2 weeks ago. Only hurts when she walks on them)  Wheezing: Reports that she has been having some increased exertional wheezing.  Had bronchitis back in May reports that the wheezing never really went away for her.  Has been using her inhaler pretty regularly.  But she only notices it when she is exerting herself or having to walk long periods of distance.  Has gained 12 pounds in the last several months.  Reports not eating as well as she was eating and is deconditioned as she is not exercising.  Additionally she has had a fall about 2 weeks ago she tripped or stumbled over something she is unsure what actually happened.  Hurt both of her ankles.  Hurts when she is walking there is achiness and discomfort achy at times sitting and when she has to walk long periods of time there is discomfort.  Some mild swelling more predominantly on the left than right.  Today patient denies signs and symptoms of COVID 19 infection including fever, chills, cough, shortness of breath, and headache.  Past Medical, Surgical, Social History, Allergies, and Medications have been Reviewed.   Past Medical History:  Diagnosis Date  . Allergic rhinitis, seasonal   . Anemia    hx of   . Anxiety    hx of panic attacks and panic attacks with surgery   . Anxiety disorder   . Chronic constipation   . Complication of anesthesia    woke up during surgery of tubal ligation   . Esophageal polyp   . Fatty liver   . GERD (gastroesophageal reflux disease)   . Hemorrhoids   . Hypertension   . Obesity    Past Surgical  History:  Procedure Laterality Date  . BLT  1987  . BREAST BIOPSY Right 2010   benign  . BREATH TEK H PYLORI N/A 02/25/2014   Procedure: BREATH TEK H PYLORI;  Surgeon: Pedro Earls, MD;  Location: [facility];  Service: General;  Laterality: N/A;  . CHOLECYSTECTOMY  1997  . COLONOSCOPY  12/30/2010  . COLONOSCOPY N/A 11/23/2017   Procedure: COLONOSCOPY;  Surgeon: Rogene Houston, MD;  Location: [facility];  Service: Endoscopy;  Laterality: N/A;  1130  . [place]  . 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 MOUTH SURGERY     wisdom tooth extraction  . PARTIAL HYSTERECTOMY    . TOTAL ABDOMINAL HYSTERECTOMY  03/11/08   for fibroids   . TUBAL LIGATION    . UPPER GASTROINTESTINAL ENDOSCOPY  12/30/2010  . UPPER GASTROINTESTINAL ENDOSCOPY  05/11/04  . UPPER GASTROINTESTINAL ENDOSCOPY  04/30/97   [name]   Social History   Socioeconomic History  . Marital status: Married    Spouse name: Not on file  . Number of children: 2  . Years of education: college  . Highest education level: Not on file  Occupational History  . Occupation: Barista   Social Needs  . Financial resource strain: Not on file  . Food insecurity    Worry: Not on file    Inability: Not on file  . Transportation needs    Medical: Not on file    Non-medical: Not on file  Tobacco Use  . Smoking status: Never Smoker  . Smokeless tobacco: Never Used  Substance and Sexual Activity  . Alcohol use: No    Alcohol/week: 0.0 standard drinks  . Drug use: No  . Sexual activity: Yes    Birth control/protection: Surgical  Lifestyle  . Physical activity    Days per week: Not on file    Minutes per session: Not on file  . Stress: Not on file  Relationships  . Social Herbalist on phone: Not on file     Gets together: Not on file    Attends religious service: Not on file    Active member of club or organization: Not on file    Attends meetings of clubs or organizations: Not on file    Relationship status: Not on file  . Intimate partner violence    Fear of current or ex partner: Not on file    Emotionally abused: Not on file    Physically abused: Not on file    Forced sexual activity: Not on file  Other Topics Concern  . Not on file  Social History Narrative  . Not on file    Outpatient Encounter Medications as of 08/01/2019  Medication Sig  . acetaminophen (TYLENOL) 500 MG tablet Take 500 mg by mouth every 6 (six) hours as needed.  Marland Kitchen albuterol (VENTOLIN HFA) 108 (90 Base) MCG/ACT inhaler Inhale 2 puffs into the lungs every 6 (six) hours as needed for wheezing or shortness of breath.  . ALPRAZolam (XANAX) 0.5 MG tablet Take 1 tablet (0.5 mg total) by mouth 2 (two) times daily as needed for anxiety.  Marland Kitchen amLODipine (NORVASC) 2.5 MG tablet Take 1 tablet (2.5 mg total) by mouth daily.  . Calcium Carb-Cholecalciferol (CALTRATE 600+D3 SOFT) 600-800 MG-UNIT CHEW Chew 1 Piece by mouth daily.  . clotrimazole-betamethasone (LOTRISONE) cream Apply to ras(es)h twice daily for 10 days then as needed  . diphenhydrAMINE (BENADRYL) 50 MG capsule Take 50 mg by mouth as needed. Patient states that she will take 25 to 50 mg depending upon symptoms.  . fluticasone (FLONASE) 50 MCG/ACT nasal spray Place 2 sprays into both nostrils daily as needed for allergies or rhinitis.  Marland Kitchen loratadine (CLARITIN) 10 MG tablet Take 10 mg by mouth daily as needed for allergies.   . methocarbamol (ROBAXIN) 750 MG tablet Take on tablet once daily as needed, for back spasm  . metoprolol tartrate (LOPRESSOR) 50 MG tablet TAKE 1 TABLET BY MOUTH TWICE A DAY  . Multiple Vitamin (MULTIVITAMIN) capsule Take 2 capsules by mouth daily.   Marland Kitchen nystatin (MYCOSTATIN/NYSTOP) powder Apply to affected area twice daily for 10 days , then as  needed  . pantoprazole (PROTONIX) 40 MG tablet Take 1 tablet (40 mg total) by mouth daily.  . Potassium 99 MG TABS Take 1 tablet by mouth daily.  . traZODone (DESYREL) 50 MG tablet TAKE 1 TABLET BY MOUTH EVERYDAY AT BEDTIME  . venlafaxine XR (EFFEXOR XR) 75 MG 24 hr capsule Take 1 capsule (75 mg total) by mouth daily.  Marland Kitchen venlafaxine XR (EFFEXOR-XR) 150 MG 24 hr capsule Take 1 capsule (150 mg total) by  mouth daily.  . vitamin B-12 (CYANOCOBALAMIN) 1000 MCG tablet Take 1,000 mcg by mouth daily.  . Vitamin D, Ergocalciferol, (DRISDOL) 1.25 MG (50000 UT) CAPS capsule TAKE 1 CAPSULE (50,000 UNITS TOTAL) BY MOUTH ONCE A WEEK.  . [DISCONTINUED] azithromycin (ZITHROMAX Z-PAK) 250 MG tablet Take 2 pills on Day 1, and take 1 pill on days 2-5. Please complete the full course   No facility-administered encounter medications on file as of 08/01/2019.    Allergies  Allergen Reactions  . Codeine     Severe headache.   . Sulfonamide Derivatives Hives and Itching  . Tape     Foam tape - caused blisters and sever itching    Review of Systems  HENT: Negative.   Eyes: Negative.   Respiratory: Positive for wheezing.   Cardiovascular: Positive for leg swelling.  Gastrointestinal: Negative.   Endocrine: Negative.   Genitourinary: Negative.   Musculoskeletal: Negative.        Fall  Skin: Negative.   Allergic/Immunologic: Negative.   Neurological: Negative.   Hematological: Negative.   Psychiatric/Behavioral: Negative.   All other systems reviewed and are negative.      Objective:     BP 140/84   Pulse 66   Temp 98.6 F (37 C) (Temporal)   Resp 15   Ht 5' 2"  (1.575 m)   Wt (!) 310 lb (140.6 kg)   SpO2 98%   BMI 56.70 kg/m   Physical Exam Vitals signs and nursing note reviewed.  Constitutional:      Appearance: Normal appearance. She is well-developed and well-groomed. She is morbidly obese.  HENT:     Head: Normocephalic and atraumatic.     Right Ear: External ear normal.     Left  Ear: External ear normal.     Nose: Nose normal.     Mouth/Throat:     Mouth: Mucous membranes are moist.     Pharynx: Oropharynx is clear.  Eyes:     General:        Right eye: No discharge.        Left eye: No discharge.     Conjunctiva/sclera: Conjunctivae normal.  Neck:     Musculoskeletal: Normal range of motion and neck supple.  Cardiovascular:     Rate and Rhythm: Normal rate and regular rhythm.     Pulses: Normal pulses.     Heart sounds: Normal heart sounds.  Pulmonary:     Effort: Pulmonary effort is normal.     Breath sounds: Normal breath sounds.  Musculoskeletal: Normal range of motion.     Right lower leg: Edema present.     Left lower leg: 1+ Edema present.  Skin:    General: Skin is warm.     Findings: Bruising, lesion and rash present.     Comments: Vascular changes in skin color  Neurological:     General: No focal deficit present.     Mental Status: She is alert and oriented to person, place, and time.  Psychiatric:        Attention and Perception: Attention normal.        Mood and Affect: Mood normal.        Speech: Speech normal.        Behavior: Behavior normal. Behavior is cooperative.        Thought Content: Thought content normal.        Cognition and Memory: Cognition normal.        Judgment: Judgment normal.  Assessment and Plan            1. Morbid obesity ([place]) Deteriorated Holly Alvarado is re-educated about the importance of exercise daily to help with weight management. A minumum of 30 minutes daily is recommended. Additionally, importance of healthy food choices  with portion control discussed.   Provided with education on the possibility that her weight has caused her to have increased deconditioning and it makes it harder for her to breathe after walking.  She is in agreement somewhat with this.  And is going to be working on her diet.  Wt Readings from Last 3 Encounters:  08/01/19 (!) 310 lb (140.6 kg)  02/01/19 298 lb  (135.2 kg)  01/23/19 298 lb (135.2 kg)     2. Essential hypertension Holly Alvarado is encouraged to maintain a well balanced diet that is low in salt. Controlled, but would benefit from better control, continue current medication regimen. No refills needed. Higher number might be related to the increase in weight that she is put on over the last several months.  Strongly encouraged her to eat a more well-balanced diet and weight loss with exercise Additionally, she  is also reminded that exercise is beneficial for heart health and control of  Blood pressure. 30-60 minutes daily is recommended-walking was suggested.   3. Wheezing No noted wheezing today during exam.  Refilled albuterol inhaler and switch to Xyzal, Claritin.  Encouraged to lose a little bit of weight to see if that also is having a role in her breathing difficulties with exertion in addition to encouraging her to walk more.  For helping for deconditioning.  - albuterol (VENTOLIN HFA) 108 (90 Base) MCG/ACT inhaler; Inhale 2 puffs into the lungs every 6 (six) hours as needed for wheezing or shortness of breath.  Dispense: 18 g; Refill: 1 - levocetirizine (XYZAL) 5 MG tablet; Take 1 tablet (5 mg total) by mouth every evening.  Dispense: 90 tablet; Refill: 0  4. Non-seasonal allergic rhinitis, unspecified trigger Not controlled, switching from Claritin to Xyzal refilling Flonase.  - levocetirizine (XYZAL) 5 MG tablet; Take 1 tablet (5 mg total) by mouth every evening.  Dispense: 90 tablet; Refill: 0 - fluticasone (FLONASE) 50 MCG/ACT nasal spray; Place 2 sprays into both nostrils daily as needed for allergies or rhinitis.  Dispense: 16 g; Refill: 3  5. Gastroesophageal reflux disease, unspecified whether esophagitis present Controlled, needs refill  - pantoprazole (PROTONIX) 40 MG tablet; Take 1 tablet (40 mg total) by mouth daily.  Dispense: 90 tablet; Refill: 1  6. Dermatomycosis Controlled, needs refill  -  clotrimazole-betamethasone (LOTRISONE) cream; Apply to ras(es)h twice daily for 10 days then as needed  Dispense: 45 g; Refill: 1  7. Fall, initial encounter [facility] 2 weeks ago after tripping.  Bilateral ankle discomfort and pain occasional leg swelling advised to wear compression socks to help with the swelling.  No pitting edema at this time.  Has been walking without issue except for mild discomfort risk of fracture minimal.  Fall education provided   8. Need for immunization against influenza Patient was educated on the recommendation for flu vaccine. After obtaining informed consent, the vaccine was administered no adverse effects noted at time of administration. Patient provided with education on arm soreness and use of tylenol or ibuprofen (if safe) for this. Encourage to use the arm vaccine was given in to help reduce the soreness. Patient educated on the signs of a reaction to the vaccine and advised to contact  the office should these occur.   - Flu Vaccine QUAD 36+ mos IM    Follow-up: 5-6 months  Perlie Mayo, DNP, AGNP-BC [facility], [address] [place], [place] [address] Office Hours: Mon-Thurs 8 am-5 pm; Fri 8 am-12 pm Office Phone:  [phone]  Office Fax: [phone]

## 2019-08-10 ENCOUNTER — Other Ambulatory Visit: Payer: Self-pay | Admitting: Family Medicine

## 2019-09-04 ENCOUNTER — Other Ambulatory Visit: Payer: Self-pay | Admitting: Family Medicine

## 2019-09-04 DIAGNOSIS — Z1231 Encounter for screening mammogram for malignant neoplasm of breast: Secondary | ICD-10-CM

## 2019-09-05 ENCOUNTER — Other Ambulatory Visit: Payer: Self-pay

## 2019-09-05 ENCOUNTER — Encounter: Payer: Self-pay | Admitting: Family Medicine

## 2019-09-05 ENCOUNTER — Ambulatory Visit (INDEPENDENT_AMBULATORY_CARE_PROVIDER_SITE_OTHER): Payer: BC Managed Care – PPO | Admitting: Family Medicine

## 2019-09-05 DIAGNOSIS — M25562 Pain in left knee: Secondary | ICD-10-CM

## 2019-09-05 MED ORDER — KETOROLAC TROMETHAMINE 60 MG/2ML IM SOLN
60.0000 mg | Freq: Once | INTRAMUSCULAR | Status: AC
  Administered 2019-09-05: 60 mg via INTRAMUSCULAR

## 2019-09-05 MED ORDER — METHYLPREDNISOLONE ACETATE 80 MG/ML IJ SUSP
80.0000 mg | Freq: Once | INTRAMUSCULAR | Status: AC
  Administered 2019-09-05: 16:00:00 80 mg via INTRAMUSCULAR

## 2019-09-05 NOTE — Patient Instructions (Addendum)
  I appreciate the opportunity to provide you with care for your health and wellness. Today we discussed: knee pain  Follow up: 01/29/2020  No labs or referrals today  Injections today to help with pain and inflammation. Please call the clinic back if this does not ease up in the next week or 2. Continue to use ice or heating pad and Tylenol as needed.  Overall joints are stressed we put extra weight on them.   Strongly encourage walking daily once the inflammation and pain is gone.   To help with weight management and reduction and encourage diet modifications  to see if that can help some as well.  I hope you have a wonderful, happy, safe, and healthy Holiday Season! See you in the New Year :)  Please continue to practice social distancing to keep you, your family, and our community safe.  If you must go out, please wear a mask and practice good handwashing.  It was a pleasure to see you and I look forward to continuing to work together on your health and well-being. Please do not hesitate to call the office if you need care or have questions about your care.  Have a wonderful day and week. With Gratitude, Cherly Beach, DNP, AGNP-BC

## 2019-09-05 NOTE — Progress Notes (Signed)
Subjective:     Patient ID: Holly Alvarado, female   DOB: 18-Jan-1966, 53 y.o.   MRN: [id number]  Holly Alvarado presents for Knee Pain (left knee x 2 weeks) Holly Alvarado is a 53 year old female patient who presents today for left-sided knee pain for the last 2 weeks.  Denies having any falls, injury or trauma to the knee.  Reports that its dull and achy when she sitting.  Very sharp discomfort pain going through the joint when she is walking or going upstairs.  Reports walking aggravates it the most.  Along with stairs.  Reports that Tylenol Extra Strength with some heating pad can help a little bit but the pain returns right back.  Once is worn off.  Reports that it is usually constant in nature she can feel it predominantly she reports is a 4 out of 10 on his pain scale with sitting and a 7-9 out of 10 with walking and doing stairs.  She reports she did have this issue in the past and she had to have some injections to help with the discomfort but it went away.  Denies ever seeing an orthopedist for this.  Reports that her ankles are better from a previous injury that she had back in November.  Does not think that these are related to the incident that she had been which was basically from a fall.  She denies having any other signs of symptoms or concerns today to discuss.  Today patient denies signs and symptoms of COVID 19 infection including fever, chills, cough, shortness of breath, and headache.  Past Medical, Surgical, Social History, Allergies, and Medications have been Reviewed.   Past Medical History:  Diagnosis Date  . Allergic rhinitis, seasonal   . Anemia    hx of   . Anxiety    hx of panic attacks and panic attacks with surgery   . Anxiety disorder   . Chronic constipation   . Complication of anesthesia    woke up during surgery of tubal ligation   . Esophageal polyp   . Fatty liver   . GERD (gastroesophageal reflux disease)   . Hemorrhoids   . Hypertension   . Obesity    Past Surgical History:  Procedure Laterality Date  . BLT  1987  . BREAST BIOPSY Right 2010   benign  . BREATH TEK H PYLORI N/A 02/25/2014   Procedure: BREATH TEK H PYLORI;  Surgeon: Pedro Earls, MD;  Location: [facility];  Service: General;  Laterality: N/A;  . CHOLECYSTECTOMY  1997  . COLONOSCOPY  12/30/2010  . COLONOSCOPY N/A 11/23/2017   Procedure: COLONOSCOPY;  Surgeon: Rogene Houston, MD;  Location: [facility];  Service: Endoscopy;  Laterality: N/A;  1130  . [place]  . 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 MOUTH SURGERY     wisdom tooth extraction  . PARTIAL HYSTERECTOMY    . TOTAL ABDOMINAL HYSTERECTOMY  03/11/08   for fibroids   . TUBAL LIGATION    . UPPER GASTROINTESTINAL ENDOSCOPY  12/30/2010  . UPPER GASTROINTESTINAL ENDOSCOPY  05/11/04  . UPPER GASTROINTESTINAL ENDOSCOPY  04/30/97   [name]   Social History   Socioeconomic History  . Marital status: Married    Spouse name: Not on file  . Number of children: 2  .  Years of education: college  . Highest education level: Not on file  Occupational History  . Occupation: Barista   Social Needs  . Financial resource strain: Not on file  . Food insecurity    Worry: Not on file    Inability: Not on file  . Transportation needs    Medical: Not on file    Non-medical: Not on file  Tobacco Use  . Smoking status: Never Smoker  . Smokeless tobacco: Never Used  Substance and Sexual Activity  . Alcohol use: No    Alcohol/week: 0.0 standard drinks  . Drug use: No  . Sexual activity: Yes    Birth control/protection: Surgical  Lifestyle  . Physical activity    Days per week: Not on file    Minutes per session: Not on file  . Stress: Not on file  Relationships  . Social Herbalist on phone: Not  on file    Gets together: Not on file    Attends religious service: Not on file    Active member of club or organization: Not on file    Attends meetings of clubs or organizations: Not on file    Relationship status: Not on file  . Intimate partner violence    Fear of current or ex partner: Not on file    Emotionally abused: Not on file    Physically abused: Not on file    Forced sexual activity: Not on file  Other Topics Concern  . Not on file  Social History Narrative  . Not on file    Outpatient Encounter Medications as of 09/05/2019  Medication Sig  . acetaminophen (TYLENOL) 500 MG tablet Take 500 mg by mouth every 6 (six) hours as needed.  Marland Kitchen albuterol (VENTOLIN HFA) 108 (90 Base) MCG/ACT inhaler Inhale 2 puffs into the lungs every 6 (six) hours as needed for wheezing or shortness of breath.  Marland Kitchen albuterol (VENTOLIN HFA) 108 (90 Base) MCG/ACT inhaler albuterol sulfate HFA 90 mcg/actuation aerosol inhaler  . ALPRAZolam (XANAX) 0.5 MG tablet Take 1 tablet (0.5 mg total) by mouth 2 (two) times daily as needed for anxiety.  Marland Kitchen amLODipine (NORVASC) 2.5 MG tablet TAKE 1 TABLET BY MOUTH EVERY DAY  . Calcium Carb-Cholecalciferol (CALTRATE 600+D3 SOFT) 600-800 MG-UNIT CHEW Chew 1 Piece by mouth daily.  . clotrimazole-betamethasone (LOTRISONE) cream Apply to ras(es)h twice daily for 10 days then as needed  . diphenhydrAMINE (BENADRYL) 50 MG capsule Take 50 mg by mouth as needed. Patient states that she will take 25 to 50 mg depending upon symptoms.  . fluticasone (FLONASE) 50 MCG/ACT nasal spray Place 2 sprays into both nostrils daily as needed for allergies or rhinitis.  Marland Kitchen gabapentin (NEURONTIN) 300 MG capsule gabapentin 300 mg capsule  . levocetirizine (XYZAL) 5 MG tablet Take 1 tablet (5 mg total) by mouth every evening.  . Liraglutide -Weight Management (SAXENDA) 18 MG/3ML SOPN Saxenda 3 mg/0.5 mL (18 mg/3 mL) subcutaneous pen injector  . methocarbamol (ROBAXIN) 750 MG tablet Take on tablet  once daily as needed, for back spasm  . metoprolol tartrate (LOPRESSOR) 50 MG tablet TAKE 1 TABLET BY MOUTH TWICE A DAY  . Multiple Vitamin (MULTI-VITAMIN) tablet Take by mouth.  . Multiple Vitamin (MULTIVITAMIN) capsule Take 2 capsules by mouth daily.   . pantoprazole (PROTONIX) 40 MG tablet Take 1 tablet (40 mg total) by mouth daily.  . pantoprazole (PROTONIX) 40 MG tablet Take by mouth.  . Potassium 99 MG TABS  Take 1 tablet by mouth daily.  . traZODone (DESYREL) 50 MG tablet TAKE 1 TABLET BY MOUTH EVERYDAY AT BEDTIME  . venlafaxine XR (EFFEXOR XR) 75 MG 24 hr capsule Take 1 capsule (75 mg total) by mouth daily.  Marland Kitchen venlafaxine XR (EFFEXOR-XR) 150 MG 24 hr capsule Take 1 capsule (150 mg total) by mouth daily.  . vitamin B-12 (CYANOCOBALAMIN) 1000 MCG tablet Take 1,000 mcg by mouth daily.  . Vitamin D, Ergocalciferol, (DRISDOL) 1.25 MG (50000 UT) CAPS capsule TAKE 1 CAPSULE (50,000 UNITS TOTAL) BY MOUTH ONCE A WEEK.   No facility-administered encounter medications on file as of 09/05/2019.    Allergies  Allergen Reactions  . Codeine     Severe headache.  Other reaction(s): Headache  . Sulfa Antibiotics Hives  . Tape Rash    Foam tape - caused blisters and sever itching  . Sulfonamide Derivatives Hives and Itching    Review of Systems  Constitutional: Negative for chills and fever.  HENT: Negative.   Eyes: Negative.   Respiratory: Negative.   Cardiovascular: Negative.   Gastrointestinal: Negative.   Endocrine: Negative.   Genitourinary: Negative.   Musculoskeletal: Positive for arthralgias.  Skin: Negative.   Allergic/Immunologic: Negative.   Neurological: Negative.   Hematological: Negative.   Psychiatric/Behavioral: Negative.   All other systems reviewed and are negative.      Objective:     There were no vitals taken for this visit.  Physical Exam Vitals signs and nursing note reviewed.  Constitutional:      Appearance: Normal appearance. She is morbidly obese.   HENT:     Head: Normocephalic and atraumatic.     Right Ear: External ear normal.     Left Ear: External ear normal.     Nose: Nose normal.     Mouth/Throat:     Pharynx: Oropharynx is clear.  Eyes:     General:        Right eye: No discharge.        Left eye: No discharge.     Conjunctiva/sclera: Conjunctivae normal.  Neck:     Musculoskeletal: Normal range of motion and neck supple.  Cardiovascular:     Rate and Rhythm: Normal rate and regular rhythm.     Pulses: Normal pulses.     Heart sounds: Normal heart sounds.  Pulmonary:     Effort: Pulmonary effort is normal.     Breath sounds: Normal breath sounds.  Musculoskeletal:     Left knee: She exhibits decreased range of motion.     Comments: Slightly reduced range of motion, crepitus noticed in the joint.  No swelling or effusion noted or tenderness in joint line.  Skin:    General: Skin is warm.  Neurological:     General: No focal deficit present.     Mental Status: She is alert and oriented to person, place, and time.  Psychiatric:        Mood and Affect: Mood normal.        Behavior: Behavior normal.        Thought Content: Thought content normal.        Judgment: Judgment normal.        Assessment and Plan        1. Morbid obesity ([place]) Deteriorated educated on the impact of weight and joint health. Holly Alvarado is educated about the importance of exercise daily to help with weight management. A minumum of 30 minutes daily is recommended. Additionally, importance of healthy  food choices  with portion control discussed.   Wt Readings from Last 3 Encounters:  09/05/19 (!) 312 lb 0.6 oz (141.5 kg)  08/01/19 (!) 310 lb (140.6 kg)  02/01/19 298 lb (135.2 kg)    2. Acute pain of left knee Acute onset of left knee pain.  Reports that she has had this several years back.  Required medications to help with inflammation and that it went away.  Denies having any injury or fall.  Has most range of motion  difficulty with going up stairs is the hardest part and walking long distances.  Provided with injections today in the office.  Encouraged to help lose weight and manage weight to help with joint health overall once inflammation is better controlled. Reviewed side effects, risks and benefits of medication.   Patient acknowledged agreement and understanding of the plan.    - ketorolac (TORADOL) injection 60 mg - methylPREDNISolone acetate (DEPO-MEDROL) injection 80 mg   Follow up: 01/29/2020  Holly Mayo, DNP, AGNP-BC [address], [address] [place], [place] [address] Office Hours: Mon-Thurs 8 am-5 pm; Fri 8 am-12 pm Office Phone:  [phone]  Office Fax: [phone]

## 2019-09-10 ENCOUNTER — Encounter: Payer: Self-pay | Admitting: Family Medicine

## 2019-09-11 ENCOUNTER — Other Ambulatory Visit: Payer: Self-pay

## 2019-09-11 ENCOUNTER — Ambulatory Visit (INDEPENDENT_AMBULATORY_CARE_PROVIDER_SITE_OTHER): Payer: BC Managed Care – PPO | Admitting: Family Medicine

## 2019-09-11 ENCOUNTER — Encounter: Payer: Self-pay | Admitting: Family Medicine

## 2019-09-11 VITALS — BP 136/82 | HR 61 | Temp 98.6°F | Resp 15 | Ht 62.0 in | Wt 312.1 lb

## 2019-09-11 DIAGNOSIS — I10 Essential (primary) hypertension: Secondary | ICD-10-CM | POA: Diagnosis not present

## 2019-09-11 DIAGNOSIS — M25562 Pain in left knee: Secondary | ICD-10-CM | POA: Diagnosis not present

## 2019-09-11 DIAGNOSIS — G4719 Other hypersomnia: Secondary | ICD-10-CM

## 2019-09-11 DIAGNOSIS — E559 Vitamin D deficiency, unspecified: Secondary | ICD-10-CM

## 2019-09-11 DIAGNOSIS — R7303 Prediabetes: Secondary | ICD-10-CM

## 2019-09-11 DIAGNOSIS — Z13220 Encounter for screening for lipoid disorders: Secondary | ICD-10-CM

## 2019-09-11 DIAGNOSIS — F332 Major depressive disorder, recurrent severe without psychotic features: Secondary | ICD-10-CM

## 2019-09-11 DIAGNOSIS — G479 Sleep disorder, unspecified: Secondary | ICD-10-CM | POA: Diagnosis not present

## 2019-09-11 DIAGNOSIS — G8929 Other chronic pain: Secondary | ICD-10-CM

## 2019-09-11 MED ORDER — VENLAFAXINE HCL ER 75 MG PO CP24: 75.0000 mg | ORAL_CAPSULE | Freq: Every day | ORAL | 1 refills | Status: DC

## 2019-09-11 NOTE — Assessment & Plan Note (Signed)
Excessive daytime sleepiness, poor memory, headaches, can't drive even 10 miles  Without having to pull over Needs asleep study

## 2019-09-11 NOTE — Assessment & Plan Note (Signed)
Fell 6 weeks ago,, wore bilateral ankle casts , after thison 2 separate occasions when attemptong tp step up, knee buckled, no esponse to IM and oralonti inflammatories, refer orho

## 2019-09-11 NOTE — Patient Instructions (Addendum)
F/U in office with MD in 3 months, call if you need me before  Please get fasting labs ordered in April thie week ( nurse please re order today)\  Please schedule and get your repeat pap  You are referred to Dr Elmyra Ricks and also to Dr Elsworth Soho, re knee and excessive fatigue  It is important that you exercise regularly at least 30 minutes 5 times a week. If you develop chest pain, have severe difficulty breathing, or feel very tired, stop exercising immediately and seek medical attention   Think about what you will eat, plan ahead. Choose " clean, green, fresh or frozen" over canned, processed or packaged foods which are more sugary, salty and fatty. 70 to 75% of food eaten should be vegetables and fruit. Three meals at set times with snacks allowed between meals, but they must be fruit or vegetables. Aim to eat over a 12 hour period , example 7 am to 7 pm, and STOP after  your last meal of the day. Drink water,generally about 64 ounces per day, no other drink is as healthy. Fruit juice is best enjoyed in a healthy way, by EATING the fruit. Thanks for choosing [facility], we consider it a privelige to serve you.

## 2019-09-16 NOTE — Assessment & Plan Note (Signed)
  Patient re-educated about  the importance of commitment to a  minimum of 150 minutes of exercise per week as able.  The importance of healthy food choices with portion control discussed, as well as eating regularly and within a 12 hour window most days. The need to choose "clean , green" food 50 to 75% of the time is discussed, as well as to make water the primary drink and set a goal of 64 ounces water daily.    Weight /BMI 09/11/2019 09/05/2019 08/01/2019  WEIGHT 312 lb 1.9 oz 312 lb 0.6 oz 310 lb  HEIGHT 5' 2"  5' 2"  5' 2"   BMI 57.09 kg/m2 57.07 kg/m2 56.7 kg/m2  Some encounter information is confidential and restricted. Go to Review Flowsheets activity to see all data.

## 2019-09-16 NOTE — Assessment & Plan Note (Signed)
Controlled, no change in medication  DASH diet and commitment to daily physical activity for a minimum of 30 minutes discussed and encouraged, as a part of hypertension management. The importance of attaining a healthy weight is also discussed.  BP/Weight 09/11/2019 09/05/2019 08/01/2019 02/01/2019 01/23/2019 11/16/2018 3/79/5583  Systolic BP 167 425 525 894 834 758 307  Diastolic BP 82 88 84 85 85 85 80  Wt. (Lbs) 312.12 312.04 310 298 298 298.2 299.04  BMI 57.09 57.07 56.7 54.5 54.5 54.54 54.7  Some encounter information is confidential and restricted. Go to Review Flowsheets activity to see all data.

## 2019-09-16 NOTE — Progress Notes (Signed)
Holly Alvarado     MRN: [id number]      DOB: 1966/08/11   HPI Holly Alvarado is here for follow up and re-evaluation of chronic medical conditions, medication management and review of any available recent lab and radiology data.  Preventive health is updated, specifically  Cancer screening and Immunization.   3 week h/o progressively disabling left knee pain and instability, no response to anti inflammatories recently prscribed   ROS Denies recent fever or chills.c/o excessive  Fatigue and daytime sleepiness Denies sinus pressure, nasal congestion, ear pain or sore throat. Denies chest congestion, productive cough or wheezing. Denies chest pains, palpitations and leg swelling Denies abdominal pain, nausea, vomiting,diarrhea or constipation.   Denies dysuria, frequency, hesitancy or incontinence.  Denies headaches, seizures, numbness, or tingling. Denies depression, anxiety or insomnia. Denies skin break down or rash.   PE  BP 136/82   Pulse 61   Temp 98.6 F (37 C) (Temporal)   Resp 15   Ht 5' 2"  (1.575 m)   Wt (!) 312 lb 1.9 oz (141.6 kg)   SpO2 98%   BMI 57.09 kg/m   Patient alert and oriented and in no cardiopulmonary distress.  HEENT: No facial asymmetry, EOMI,     Neck supple .  Chest: Clear to auscultation bilaterally.  CVS: S1, S2 no murmurs, no S3.Regular rate.  ABD: Soft non tender.   Ext: No edema  TG:GYIRSWNIO  ROM spine, shoulders, hips and  Markedly decreased in left knees.  Skin: Intact, no ulcerations or rash noted.  Psych: Good eye contact, normal affect. Memory intact not anxious or depressed appearing.  CNS: CN 2-12 intact, power,  normal throughout.no focal deficits noted.   Assessment & Plan  Knee pain, left Fell 6 weeks ago,, wore bilateral ankle casts , after thison 2 separate occasions when attemptong tp step up, knee buckled, no esponse to IM and oralonti inflammatories, refer orho  Sleep disorder Excessive daytime sleepiness, poor  memory, headaches, can't drive even 10 miles  Without having to pull over Needs asleep study  Essential hypertension Controlled, no change in medication  DASH diet and commitment to daily physical activity for a minimum of 30 minutes discussed and encouraged, as a part of hypertension management. The importance of attaining a healthy weight is also discussed.  BP/Weight 09/11/2019 09/05/2019 08/01/2019 02/01/2019 01/23/2019 11/16/2018 2/70/3500  Systolic BP 938 182 993 716 967 893 810  Diastolic BP 82 88 84 85 85 85 80  Wt. (Lbs) 312.12 312.04 310 298 298 298.2 299.04  BMI 57.09 57.07 56.7 54.5 54.5 54.54 54.7  Some encounter information is confidential and restricted. Go to Review Flowsheets activity to see all data.       Morbid obesity ([place])  Patient re-educated about  the importance of commitment to a  minimum of 150 minutes of exercise per week as able.  The importance of healthy food choices with portion control discussed, as well as eating regularly and within a 12 hour window most days. The need to choose "clean , green" food 50 to 75% of the time is discussed, as well as to make water the primary drink and set a goal of 64 ounces water daily.    Weight /BMI 09/11/2019 09/05/2019 08/01/2019  WEIGHT 312 lb 1.9 oz 312 lb 0.6 oz 310 lb  HEIGHT 5' 2"  5' 2"  5' 2"   BMI 57.09 kg/m2 57.07 kg/m2 56.7 kg/m2  Some encounter information is confidential and restricted. Go to Review Flowsheets activity to  see all data.      Severe recurrent major depression without psychotic features ([place]) Not suicidal or homicidal, still trerated by Psychiatry , but discharged from therapy due to  Missing ssessions, wants to re establish , states has severe memory loss, will reach out on her behalf

## 2019-09-16 NOTE — Assessment & Plan Note (Signed)
Not suicidal or homicidal, still trerated by Psychiatry , but discharged from therapy due to  Missing ssessions, wants to re establish , states has severe memory loss, will reach out on her behalf

## 2019-10-24 ENCOUNTER — Ambulatory Visit: Payer: BC Managed Care – PPO

## 2019-11-05 ENCOUNTER — Other Ambulatory Visit: Payer: Self-pay

## 2019-11-05 ENCOUNTER — Ambulatory Visit
Admission: RE | Admit: 2019-11-05 | Discharge: 2019-11-05 | Disposition: A | Discharge status: Home / Self Care | Payer: BC Managed Care – PPO | Source: Ambulatory Visit | Attending: Family Medicine | Admitting: Family Medicine

## 2019-11-05 DIAGNOSIS — Z1231 Encounter for screening mammogram for malignant neoplasm of breast: Secondary | ICD-10-CM

## 2019-11-07 ENCOUNTER — Other Ambulatory Visit: Payer: Self-pay

## 2019-11-07 DIAGNOSIS — J3089 Other allergic rhinitis: Secondary | ICD-10-CM

## 2019-11-07 DIAGNOSIS — R062 Wheezing: Secondary | ICD-10-CM

## 2019-11-07 MED ORDER — LEVOCETIRIZINE DIHYDROCHLORIDE 5 MG PO TABS: 5.0000 mg | ORAL_TABLET | Freq: Every evening | ORAL | 0 refills | Status: DC

## 2019-11-07 MED ORDER — FLUTICASONE PROPIONATE 50 MCG/ACT NA SUSP: 2.0000 | Freq: Every day | NASAL | 3 refills | Status: DC | PRN

## 2019-12-02 ENCOUNTER — Ambulatory Visit: Payer: BC Managed Care – PPO | Attending: Internal Medicine

## 2019-12-02 DIAGNOSIS — Z23 Encounter for immunization: Secondary | ICD-10-CM

## 2019-12-02 NOTE — Progress Notes (Signed)
   Covid-19 Vaccination Clinic  Name:  Holly Alvarado    MRN: [id number] DOB: 1966-05-16  12/02/2019  [name] was observed post Covid-19 immunization for 15 minutes without incident. She was provided with Vaccine Information Sheet and instruction to access the V-Safe system.   [name] was instructed to call 911 with any severe reactions post vaccine: Marland Kitchen Difficulty breathing  . Swelling of face and throat  . A fast heartbeat  . A bad rash all over body  . Dizziness and weakness   Immunizations Administered    Name Date Dose VIS Date Route   Pfizer COVID-19 Vaccine 12/02/2019  6:06 PM 0.3 mL 09/07/2019 Intramuscular   Manufacturer: [place]   Lot: [id number]   [place]: [id number]

## 2019-12-11 ENCOUNTER — Ambulatory Visit: Payer: BC Managed Care – PPO | Admitting: Family Medicine

## 2019-12-13 ENCOUNTER — Other Ambulatory Visit: Payer: Self-pay | Admitting: Family Medicine

## 2019-12-13 DIAGNOSIS — E8809 Other disorders of plasma-protein metabolism, not elsewhere classified: Secondary | ICD-10-CM

## 2019-12-13 DIAGNOSIS — R7989 Other specified abnormal findings of blood chemistry: Secondary | ICD-10-CM

## 2019-12-13 LAB — COMPLETE METABOLIC PANEL WITH GFR
AG Ratio: 0.7 (calc) — ABNORMAL LOW (ref 1.0–2.5)
ALT: 49 U/L — ABNORMAL HIGH (ref 6–29)
AST: 103 U/L — ABNORMAL HIGH (ref 10–35)
Albumin: 3.1 g/dL — ABNORMAL LOW (ref 3.6–5.1)
Alkaline phosphatase (APISO): 145 U/L (ref 37–153)
BUN: 9 mg/dL (ref 7–25)
CO2: 27 mmol/L (ref 20–32)
Calcium: 8.8 mg/dL (ref 8.6–10.4)
Chloride: 109 mmol/L (ref 98–110)
Creat: 0.62 mg/dL (ref 0.50–1.05)
GFR, Est African American: 119 mL/min/{1.73_m2} (ref >=60)
GFR, Est Non African American: 103 mL/min/{1.73_m2} (ref >=60)
Globulin: 4.3 g/dL (calc) — ABNORMAL HIGH (ref 1.9–3.7)
Glucose, Bld: 111 mg/dL — ABNORMAL HIGH (ref 65–99)
Potassium: 3.7 mmol/L (ref 3.5–5.3)
Sodium: 141 mmol/L (ref 135–146)
Total Bilirubin: 1 mg/dL (ref 0.2–1.2)
Total Protein: 7.4 g/dL (ref 6.1–8.1)

## 2019-12-13 LAB — ADVANCED WRITTEN NOTIFICATION (AWN) TEST REFUSAL: AWN TEST REFUSED: 899

## 2019-12-13 LAB — LIPID PANEL
Cholesterol: 133 mg/dL (ref <200)
HDL: 28 mg/dL — ABNORMAL LOW (ref >=50)
LDL Cholesterol (Calc): 85 mg/dL (calc)
Non-HDL Cholesterol (Calc): 105 mg/dL (calc) (ref <130)
Total CHOL/HDL Ratio: 4.8 (calc) (ref <5.0)
Triglycerides: 100 mg/dL (ref <150)

## 2019-12-13 LAB — VITAMIN D 25 HYDROXY (VIT D DEFICIENCY, FRACTURES): Vit D, 25-Hydroxy: 75 ng/mL (ref 30–100)

## 2019-12-13 LAB — HEMOGLOBIN A1C
Hgb A1c MFr Bld: 6.1 % of total Hgb — ABNORMAL HIGH (ref <5.7)
Mean Plasma Glucose: 128 (calc)
eAG (mmol/L): 7.1 (calc)

## 2019-12-13 LAB — CBC
HCT: 41.1 % (ref 35.0–45.0)
Hemoglobin: 13.3 g/dL (ref 11.7–15.5)
MCH: 23.9 pg — ABNORMAL LOW (ref 27.0–33.0)
MCHC: 32.4 g/dL (ref 32.0–36.0)
MCV: 73.9 fL — ABNORMAL LOW (ref 80.0–100.0)
MPV: 11.6 fL (ref 7.5–12.5)
Platelets: 154 10*3/uL (ref 140–400)
RBC: 5.56 10*6/uL — ABNORMAL HIGH (ref 3.80–5.10)
RDW: 16.1 % — ABNORMAL HIGH (ref 11.0–15.0)
WBC: 6.3 10*3/uL (ref 3.8–10.8)

## 2019-12-23 ENCOUNTER — Ambulatory Visit: Payer: BC Managed Care – PPO | Attending: Internal Medicine

## 2019-12-23 DIAGNOSIS — Z23 Encounter for immunization: Secondary | ICD-10-CM

## 2019-12-23 NOTE — Progress Notes (Signed)
   Covid-19 Vaccination Clinic  Name:  Holly Alvarado    MRN: [id number] DOB: 1965-10-07  12/23/2019  Holly Alvarado was observed post Covid-19 immunization for 15 minutes .  During the observation period, she experienced an adverse reaction with the following symptoms:  tingling on L scalp, L Face, L tongue, L arm and L hand. She gave this information to observation nurse after 15 minutes of waiting after getting vaccinated. Pt informed this nurse that she had a migraine before coming in to get vaccinated and the symptoms that were presenting to patient (tingling) usually accompanied her migraines.  Pt was taken to an area to be assessed by paramedics. Pt was able to swallow water without difficulty. Per Paramedics, pt is okay to be discharged home.  Pt states her tingling in her tongue has gone away but she still has tingling in her L scalp, L hand, L arm.  Pt is alert and oriented at discharge and notified to call 911 if symptoms worsened. Pt verbalized understanding of instructions and husband was driving her home today. Pt stated she would take a benadryl at home after being offered benadryl at vaccination site.  VS: 140/76, 64hr, 100%02. 123/67, 58hr, 100%02   Assessment : Time of assessment 1548. Alert and oriented.  Actions taken:  Vitals sign taken  VAERS form completed   Medications administered: No medication administered.  Disposition: Reports no further symptoms of adverse reaction after observation for 15 minutes. Discharged home. Instructed to follow up with PCP  for evaluation for second dose. Instructed to call 911 for trouble breathing, rapid heart rate, dizziness, swelling of tongue or throat.   Immunizations Administered    Name Date Dose VIS Date Route   Pfizer COVID-19 Vaccine 12/23/2019  1:29 PM 0.3 mL 09/07/2019 Intramuscular   Manufacturer: [place]   Lot: [id number]   [place]: [phone]

## 2019-12-25 ENCOUNTER — Encounter: Payer: Self-pay | Admitting: *Deleted

## 2019-12-25 ENCOUNTER — Telehealth: Payer: Self-pay | Admitting: *Deleted

## 2019-12-25 NOTE — Telephone Encounter (Signed)
Called and left a voicemail for the patient in regards to her COVID vaccine that she received on 12/23/2019 to follow up and see how she was doing. Left a voicemail asking for the patient to return call to discuss. Will attempt to call again.

## 2019-12-26 ENCOUNTER — Ambulatory Visit: Payer: BC Managed Care – PPO | Admitting: Family Medicine

## 2019-12-26 ENCOUNTER — Encounter: Payer: Self-pay | Admitting: Family Medicine

## 2020-01-29 ENCOUNTER — Ambulatory Visit: Payer: BC Managed Care – PPO | Admitting: Family Medicine

## 2020-01-29 ENCOUNTER — Other Ambulatory Visit: Payer: Self-pay | Admitting: Family Medicine

## 2020-01-30 ENCOUNTER — Other Ambulatory Visit: Payer: Self-pay | Admitting: *Deleted

## 2020-01-30 DIAGNOSIS — K219 Gastro-esophageal reflux disease without esophagitis: Secondary | ICD-10-CM

## 2020-01-30 MED ORDER — PANTOPRAZOLE SODIUM 40 MG PO TBEC: 40.0000 mg | DELAYED_RELEASE_TABLET | Freq: Every day | ORAL | 1 refills | Status: DC

## 2020-02-07 ENCOUNTER — Encounter: Payer: Self-pay | Admitting: Family Medicine

## 2020-02-07 ENCOUNTER — Other Ambulatory Visit: Payer: Self-pay

## 2020-02-07 ENCOUNTER — Telehealth (INDEPENDENT_AMBULATORY_CARE_PROVIDER_SITE_OTHER): Payer: BC Managed Care – PPO | Admitting: Family Medicine

## 2020-02-07 VITALS — BP 137/70 | Ht 65.0 in | Wt 312.0 lb

## 2020-02-07 DIAGNOSIS — J0140 Acute pansinusitis, unspecified: Secondary | ICD-10-CM | POA: Diagnosis not present

## 2020-02-07 MED ORDER — AZITHROMYCIN 250 MG PO TABS: ORAL_TABLET | ORAL | 0 refills | Status: DC

## 2020-02-07 MED ORDER — CROMOLYN SODIUM 5.2 MG/ACT NA AERS: 1.0000 | INHALATION_SPRAY | Freq: Four times a day (QID) | NASAL | 1 refills | Status: DC

## 2020-02-07 NOTE — Progress Notes (Signed)
Virtual Visit via Telephone Note   This visit type was conducted due to national recommendations for restrictions regarding the COVID-19 Pandemic (e.g. social distancing) in an effort to limit this patient's exposure and mitigate transmission in our community.  Due to her co-morbid illnesses, this patient is at least at moderate risk for complications without adequate follow up.  This format is felt to be most appropriate for this patient at this time.  The patient did not have access to video technology/had technical difficulties with video requiring transitioning to audio format only (telephone).  All issues noted in this document were discussed and addressed.  No physical exam could be performed with this format.    Evaluation Performed:  Follow-up visit  Date:  02/07/2020   ID:  Holly, Alvarado 1966-05-09, MRN [id number]  Patient Location: Home Provider Location: Office  Location of Patient: Home Location of Provider: Telehealth Consent was obtain for visit to be over via telehealth. I verified that I am speaking with the correct person using two identifiers.  PCP:  [name], MD   Chief Complaint: Sinuses  History of Present Illness:    Holly Alvarado is a 54 y.o. female with history of having sinus infections around this time the year, anxiety, GERD, obesity, hypertension among others.  Presents today having right ear hurting, cough, congestion, nasal drainage, face pain behind the eyes and above the jaw for about 1-1/2 to 2 weeks.  Was taking Tylenol, Xyzal and nasal spray without relief.  Reports drainage is now greenish-yellow where it was clear.  And she feels miserable.  Denies having any recent sick contacts or exposure to Covid that she is aware of.  The patient does not have symptoms concerning for COVID-19 infection (fever, chills, cough, or new shortness of breath).   Past Medical, Surgical, Social History, Allergies, and Medications have been  Reviewed.  Past Medical History:  Diagnosis Date  . Allergic rhinitis, seasonal   . Anemia    hx of   . Anxiety    hx of panic attacks and panic attacks with surgery   . Anxiety disorder   . Chronic constipation   . Complication of anesthesia    woke up during surgery of tubal ligation   . Esophageal polyp   . Fatty liver   . GERD (gastroesophageal reflux disease)   . Hemorrhoids   . Hypertension   . Obesity    Past Surgical History:  Procedure Laterality Date  . BLT  1987  . BREAST BIOPSY Right 2010   benign  . BREATH TEK H PYLORI N/A 02/25/2014   Procedure: BREATH TEK H PYLORI;  Surgeon: Pedro Earls, MD;  Location: [facility];  Service: General;  Laterality: N/A;  . CHOLECYSTECTOMY  1997  . COLONOSCOPY  12/30/2010  . COLONOSCOPY N/A 11/23/2017   Procedure: COLONOSCOPY;  Surgeon: Rogene Houston, MD;  Location: [facility];  Service: Endoscopy;  Laterality: N/A;  1130  . [place]  . 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 MOUTH SURGERY     wisdom tooth extraction  . PARTIAL HYSTERECTOMY    . TOTAL ABDOMINAL HYSTERECTOMY  03/11/08   for fibroids   . TUBAL LIGATION    . UPPER GASTROINTESTINAL ENDOSCOPY  12/30/2010  . UPPER GASTROINTESTINAL ENDOSCOPY  05/11/04  .  UPPER GASTROINTESTINAL ENDOSCOPY  04/30/97   ANWAR     Current Meds  Medication Sig  . acetaminophen (TYLENOL) 500 MG tablet Take 500 mg by mouth every 6 (six) hours as needed.  Marland Kitchen albuterol (VENTOLIN HFA) 108 (90 Base) MCG/ACT inhaler Inhale 2 puffs into the lungs every 6 (six) hours as needed for wheezing or shortness of breath.  . ALPRAZolam (XANAX) 0.5 MG tablet Take 1 tablet (0.5 mg total) by mouth 2 (two) times daily as needed for anxiety.  Marland Kitchen amLODipine (NORVASC) 2.5 MG tablet TAKE 1 TABLET BY MOUTH EVERY DAY  .  Calcium Carb-Cholecalciferol (CALTRATE 600+D3 SOFT) 600-800 MG-UNIT CHEW Chew 1 Piece by mouth daily.  . clotrimazole-betamethasone (LOTRISONE) cream Apply to ras(es)h twice daily for 10 days then as needed  . diphenhydrAMINE (BENADRYL) 50 MG capsule Take 50 mg by mouth as needed. Patient states that she will take 25 to 50 mg depending upon symptoms.  . fluticasone (FLONASE) 50 MCG/ACT nasal spray Place 2 sprays into both nostrils daily as needed for allergies or rhinitis.  Marland Kitchen levocetirizine (XYZAL) 5 MG tablet Take 1 tablet (5 mg total) by mouth every evening.  . Liraglutide -Weight Management (SAXENDA) 18 MG/3ML SOPN Saxenda 3 mg/0.5 mL (18 mg/3 mL) subcutaneous pen injector  . methocarbamol (ROBAXIN) 750 MG tablet Take on tablet once daily as needed, for back spasm  . metoprolol tartrate (LOPRESSOR) 50 MG tablet TAKE 1 TABLET BY MOUTH TWICE A DAY  . Multiple Vitamin (MULTI-VITAMIN) tablet Take by mouth.  . pantoprazole (PROTONIX) 40 MG tablet Take 1 tablet (40 mg total) by mouth daily.  . Potassium 99 MG TABS Take 1 tablet by mouth daily.  . traZODone (DESYREL) 50 MG tablet TAKE 1 TABLET BY MOUTH EVERYDAY AT BEDTIME  . venlafaxine XR (EFFEXOR XR) 75 MG 24 hr capsule Take 1 capsule (75 mg total) by mouth daily.  Marland Kitchen venlafaxine XR (EFFEXOR-XR) 150 MG 24 hr capsule Take 1 capsule (150 mg total) by mouth daily.  Marland Kitchen venlafaxine XR (EFFEXOR-XR) 75 MG 24 hr capsule Take 1 capsule (75 mg total) by mouth daily with breakfast.  . vitamin B-12 (CYANOCOBALAMIN) 1000 MCG tablet Take 1,000 mcg by mouth daily.     Allergies:   Codeine, Sulfa antibiotics, Tape, and Sulfonamide derivatives   ROS:   Please see the history of present illness.    All other systems reviewed and are negative.   Labs/Other Tests and Data Reviewed:    Recent Labs: 12/12/2019: ALT 49; BUN 9; Creat 0.62; Hemoglobin 13.3; Platelets 154; Potassium 3.7; Sodium 141   Recent Lipid Panel Lab Results  Component Value Date/Time   CHOL  133 12/12/2019 07:27 AM   TRIG 100 12/12/2019 07:27 AM   HDL 28 (L) 12/12/2019 07:27 AM   CHOLHDL 4.8 12/12/2019 07:27 AM   LDLCALC 85 12/12/2019 07:27 AM    Wt Readings from Last 3 Encounters:  02/07/20 (!) 312 lb (141.5 kg)  09/11/19 (!) 312 lb 1.9 oz (141.6 kg)  09/05/19 (!) 312 lb 0.6 oz (141.5 kg)     Objective:    Vital Signs:  BP 137/70   Ht 5' 5"  (1.651 m)   Wt (!) 312 lb (141.5 kg)   BMI 51.92 kg/m    VITAL SIGNS:  reviewed GEN:  Alert and oriented RESPIRATORY:  Shortness of breath noted in conversation PSYCH:  Normal affect and mood  Nasal tone heard in conversation, cough  ASSESSMENT & PLAN:    1. Acute non-recurrent pansinusitis  -  cromolyn (NASALCROM) 5.2 MG/ACT nasal spray; Place 1 spray into both nostrils 4 (four) times daily.  Dispense: 26 mL; Refill: 1 - azithromycin (ZITHROMAX) 250 MG tablet; Take 2 tablets (500 mg) on the first day. Then take 1 tablet (272m) on days 2-5.  Dispense: 6 tablet; Refill: 0   Time:   Today, I have spent 7 minutes with the patient with telehealth technology discussing the above problems.     Medication Adjustments/Labs and Tests Ordered: Current medicines are reviewed at length with the patient today.  Concerns regarding medicines are outlined above.   Tests Ordered: No orders of the defined types were placed in this encounter.   Medication Changes: No orders of the defined types were placed in this encounter.   Disposition:  Follow up 05/21/2020  Signed, HPerlie Mayo NP  02/07/2020 4:39 PM     [facility]

## 2020-02-09 ENCOUNTER — Other Ambulatory Visit: Payer: Self-pay | Admitting: Family Medicine

## 2020-02-12 ENCOUNTER — Encounter: Payer: Self-pay | Admitting: Family Medicine

## 2020-02-12 DIAGNOSIS — J0140 Acute pansinusitis, unspecified: Secondary | ICD-10-CM | POA: Insufficient documentation

## 2020-02-12 NOTE — Patient Instructions (Addendum)
I appreciate the opportunity to provide you with care for your health and wellness. Today we discussed: Sinus infection  Follow up: As needed, next appointment is in August  No labs or referrals today  We hope that you feel better please call if you develop after completing medication  Please continue to practice social distancing to keep you, your family, and our community safe.  If you must go out, please wear a mask and practice good handwashing.  It was a pleasure to see you and I look forward to continuing to work together on your health and well-being. Please do not hesitate to call the office if you need care or have questions about your care.  Have a wonderful day and week. With Gratitude, Cherly Beach, DNP, AGNP-BC

## 2020-02-12 NOTE — Assessment & Plan Note (Signed)
Given the duration of her symptoms will treat for acute nonrecurrent pansinusitis.  Zithromax prescribed, changing from Flonase to cromolyn, continue allergy medications as prescribed. Provided with information to increase water intake, vitamin C, rest and to avoid being around anybody else at this time wash hands well and continue to wear a mask.

## 2020-03-27 ENCOUNTER — Encounter (INDEPENDENT_AMBULATORY_CARE_PROVIDER_SITE_OTHER): Payer: Self-pay | Admitting: Gastroenterology

## 2020-03-27 ENCOUNTER — Other Ambulatory Visit: Payer: Self-pay

## 2020-03-27 ENCOUNTER — Ambulatory Visit (INDEPENDENT_AMBULATORY_CARE_PROVIDER_SITE_OTHER): Payer: BC Managed Care – PPO | Admitting: Gastroenterology

## 2020-03-27 VITALS — BP 117/73 | HR 77 | Temp 97.8°F | Ht 62.0 in | Wt 307.2 lb

## 2020-03-27 DIAGNOSIS — R748 Abnormal levels of other serum enzymes: Secondary | ICD-10-CM | POA: Diagnosis not present

## 2020-03-27 NOTE — Patient Instructions (Signed)
We are checking labs and will call w/ results

## 2020-03-27 NOTE — Progress Notes (Signed)
i  Patient profile: Holly Alvarado is a 54 y.o. female seen for evaluation of elevated LFTS 10/2018--long history of elevated LFTS dating back to early 2000s. Viral markers negative for Hep B and C in past. lfts did not improve w/ bariatric surgery.    History of Present Illness: Holly Alvarado is seen today for follow up. Reports she is tired and has diffuse joint pain (large joints and toes/finger tips) but denies any specific new GI complaints. Moves stools about every other day. Uses miralax occasional and uses fresh fruit as needed also for constipation. Denies any blood in stools or diarrhea.   Mild chronic epigastric pain x years, unsure if relates to meals, occasionally has nausea w/ it. When gets pain goes on clear liquids for 1-2 days which helps and pain resovles. On average has episodes few times year. Unchanged over many years-has had EGD for eval. Tums and omeprazole also help pain.   Denies family history of liver disease. Brother has history of intermittently elevated LFTS. Fathers sister recently diagnosed w/ lupus and addison's. Sister recently diagnosed with autoimmune skin condition.   Non smoker. No nsaids. Very occasional tylenol. Alcohol 1-2x/year.    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 Allergic rhinitis, seasonal   . Anemia    hx of   . Anxiety    hx of panic attacks and panic attacks with surgery   . Anxiety disorder   . Chronic constipation   . Complication of anesthesia    woke up during surgery of tubal ligation   . Esophageal polyp   . Fatty liver   . GERD (gastroesophageal reflux disease)   . Hemorrhoids   . Hypertension   . Obesity     Problem List: Patient Active Problem List   Diagnosis Date Noted  . Acute  non-recurrent pansinusitis 02/12/2020  . Knee pain, left 09/11/2019  . Sleep disorder 09/11/2019  . Low back pain 04/19/2019  . Urinary urgency 01/27/2019  . Severe recurrent major depression without psychotic features ([place]) 10/30/2018  . History of colonic polyps 09/15/2017  . Family hx of colon cancer 09/15/2017  . 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 Eczema 12/24/2015  . Morbid obesity ([place]) 04/24/2015  . Insomnia 04/24/2015  . 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 Seborrheic dermatitis of scalp 06/13/2014  . Lymphocytosis 09/10/2013  . Colon polyps,hyperplastic, sigmoid 09/10/2013  . Steatosis of liver 09/10/2013  . 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 Metabolic syndrome X 40/98/1191  . Elevated LFTs 05/15/2013  . Vitamin D deficiency 05/11/2013  . Prediabetes 05/10/2013  . Depression with anxiety 08/07/2012  . Joint pain 07/25/2012  . Carotid bruit 10/07/2011  . Essential hypertension 06/18/2008  . GAD (generalized anxiety disorder) 02/20/2008  . Allergic rhinitis 02/20/2008  . GERD 02/20/2008    Past Surgical History: Past Surgical History:  Procedure Laterality Date  . BLT  1987  . BREAST BIOPSY Right 2010   benign  . BREATH TEK H PYLORI N/A 02/25/2014   Procedure: BREATH TEK H PYLORI;  Surgeon: Pedro Earls, MD;  Location: [facility];  Service: General;  Laterality: N/A;  . CHOLECYSTECTOMY  1997  . COLONOSCOPY  12/30/2010  . COLONOSCOPY N/A 11/23/2017  Procedure: COLONOSCOPY;  Surgeon: Rogene Houston, MD;  Location: [facility];  Service: Endoscopy;  Laterality: N/A;  1130  . [place]  . 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 MOUTH SURGERY     wisdom tooth extraction  . PARTIAL HYSTERECTOMY    . TOTAL ABDOMINAL HYSTERECTOMY  03/11/08   for fibroids   . TUBAL LIGATION    .  UPPER GASTROINTESTINAL ENDOSCOPY  12/30/2010  . UPPER GASTROINTESTINAL ENDOSCOPY  05/11/04  . UPPER GASTROINTESTINAL ENDOSCOPY  04/30/97   [name]    Allergies: Allergies  Allergen Reactions  . Codeine     Severe headache.  Other reaction(s): Headache  . Sulfa Antibiotics Hives  . Tape Rash    Foam tape - caused blisters and sever itching  . Sulfonamide Derivatives Hives and Itching      Home Medications:  Current Outpatient Medications:  .  acetaminophen (TYLENOL) 500 MG tablet, Take 500 mg by mouth every 6 (six) hours as needed., Disp: , Rfl:  .  albuterol (VENTOLIN HFA) 108 (90 Base) MCG/ACT inhaler, Inhale 2 puffs into the lungs every 6 (six) hours as needed for wheezing or shortness of breath., Disp: 18 g, Rfl: 1 .  ALPRAZolam (XANAX) 0.5 MG tablet, Take 1 tablet (0.5 mg total) by mouth 2 (two) times daily as needed for anxiety., Disp: 60 tablet, Rfl: 2 .  amLODipine (NORVASC) 2.5 MG tablet, TAKE 1 TABLET BY MOUTH EVERY DAY, Disp: 90 tablet, Rfl: 3 .  Calcium Carb-Cholecalciferol (CALTRATE 600+D3 SOFT) 600-800 MG-UNIT CHEW, Chew 1 Piece by mouth daily., Disp: , Rfl:  .  clotrimazole-betamethasone (LOTRISONE) cream, Apply to ras(es)h twice daily for 10 days then as needed, Disp: 45 g, Rfl: 1 .  diphenhydrAMINE (BENADRYL) 50 MG capsule, Take 50 mg by mouth as needed. Patient states that she will take 25 to 50 mg depending upon symptoms., Disp: , Rfl:  .  levocetirizine (XYZAL) 5 MG tablet, Take 1 tablet (5 mg total) by mouth every evening., Disp: 90 tablet, Rfl: 0 .  methocarbamol (ROBAXIN) 750 MG tablet, Take on tablet once daily as needed, for back spasm, Disp: 30 tablet, Rfl: 1 .  metoprolol tartrate (LOPRESSOR) 50 MG tablet, TAKE 1 TABLET BY MOUTH TWICE A DAY, Disp: 180 tablet, Rfl: 1 .  Multiple Vitamin (MULTI-VITAMIN) tablet, Take by mouth daily. , Disp: , Rfl:  .  pantoprazole (PROTONIX) 40 MG tablet, Take 1 tablet (40 mg total) by mouth daily., Disp: 90 tablet, Rfl: 1 .   Potassium 99 MG TABS, Take 1 tablet by mouth daily., Disp: , Rfl:  .  traZODone (DESYREL) 50 MG tablet, TAKE 1 TABLET BY MOUTH EVERYDAY AT BEDTIME, Disp: 90 tablet, Rfl: 2 .  venlafaxine XR (EFFEXOR XR) 75 MG 24 hr capsule, Take 1 capsule (75 mg total) by mouth daily., Disp: 90 capsule, Rfl: 2 .  venlafaxine XR (EFFEXOR-XR) 150 MG 24 hr capsule, Take 1 capsule (150 mg total) by mouth daily., Disp: 90 capsule, Rfl: 2 .  vitamin B-12 (CYANOCOBALAMIN) 1000 MCG tablet, Take 1,000 mcg by mouth daily., Disp: , Rfl:  .  venlafaxine XR (EFFEXOR-XR) 75 MG 24 hr capsule, Take 1 capsule (75 mg total) by mouth daily with breakfast. (Patient not taking: Reported on 03/27/2020), Disp: 90 capsule, Rfl: 1   Family History: family history includes Alcohol  abuse in her father; Arthritis in an other family member; Asthma in an other family member; COPD in her mother; Cancer in an other family member; Colon cancer in her maternal aunt, maternal aunt, maternal aunt, maternal aunt, maternal aunt, and maternal aunt; Depression in her father and mother; Diabetes in an other family member; Drug abuse in her father; Heart disease in an other family member; Hyperlipidemia in her father, sister, and sister; Hypertension in her father, mother, sister, and sister; Schizophrenia in her maternal uncle; Stroke in an other family member.    Social History:   reports that she has never smoked. She has never used smokeless tobacco. She reports that she does not drink alcohol and does not use drugs.   Review of Systems: Constitutional: Denies weight loss/weight gain  Eyes: No changes in vision. ENT: No oral lesions, sore throat.  GI: see HPI.  Heme/Lymph: No easy bruising.  CV: No chest pain.  GU: No hematuria.  Integumentary: No rashes.  Neuro: No headaches.  Psych: No depression/anxiety.  Endocrine: No heat/cold intolerance.  Allergic/Immunologic: No urticaria.  Resp: No cough, SOB.  Musculoskeletal: No joint swelling.      Physical Examination: BP 117/73 (BP Location: Right Arm, Patient Position: Sitting, Cuff Size: Large)   Pulse 77   Temp 97.8 F (36.6 C) (Oral)   Ht 5' 2"  (1.575 m)   Wt (!) 307 lb 3.2 oz (139.3 kg)   BMI 56.19 kg/m  Gen: NAD, alert and oriented x 4 HEENT: PEERLA, EOMI, Neck: supple, no JVD Chest: CTA bilaterally, no wheezes, crackles, or other adventitious sounds CV: RRR, no m/g/c/r Abd: soft, NT, ND, +BS in all four quadrants; no HSM, guarding, ridigity, or rebound tenderness Ext: no edema, well perfused with 2+ pulses, Skin: no rash or lesions noted on observed skin Lymph: no noted LAD  Data Reviewed:   SMA less than 20, sedrate 14, Iron 175, TIBC 351, % sat 50. Ceruloplasmin 27.  Mitochondrial IGG< less than 20. ANA positive, ANA titer 1:80, ANA pattern Nuclear, ANA pattern Cytoplasmic.  ANA pattern nuclear, speckled.  04/24/2015 Acute hepatitis panel negative.   08/17/2018 US abdomen with elast: F2 and F3.   11/2019---AST 103, ALT 49, normal platelet. Hgb 13.3, MCV 73.9, A1c 6.1. Albumin 3.1    2016--IMPRESSION: MRCP 1. Subtle changes within the liver which may be indicative of early cirrhosis. Consider further evaluation and risk stratification with ultrasound liver elastography. 2. Mild increase caliber of the common bile duct status post cholecystectomy. No obstructing stone or mass noted.  10/2018-AST 92, ALT 41  Assessment/Plan: Ms. Boling is a 54 y.o. female with chronic history of elevated LFTs.  Last February she hadpositive ANA 1:80.  We will repeat her ANA as well as immunoglobulin panel.  Would consider liver biopsy at some point given chronic elevation of LFTs.  Her albumin is low but platelets are normal.  Also check INR.  Last abdominal imaging was 2 years ago with F2 to 3 fibrosis.  Will discuss with Dr. Laural Golden repeating MRCP versus ultrasound pending her lab results.  Gayna was seen today for follow-up.  Diagnoses and all orders for this  visit:  Elevated liver enzymes -     INR/PT -     Hepatic function panel -     ANA, IFA Comprehensive Panel -     Immunoglobulins, QN, A/E/G/M -     Anti-Smooth Muscle Antibody, IGG -     Mitochondrial antibodies -     IgG, IgA,  IgM      I personally performed the service, non-incident to. (WP)  Laurine Blazer, [facility]

## 2020-04-01 LAB — ANTI-SMOOTH MUSCLE ANTIBODY, IGG: Actin (Smooth Muscle) Antibody (IGG): <20 U (ref <20)

## 2020-04-01 LAB — HEPATIC FUNCTION PANEL
AG Ratio: 0.7 (calc) — ABNORMAL LOW (ref 1.0–2.5)
ALT: 61 U/L — ABNORMAL HIGH (ref 6–29)
AST: 102 U/L — ABNORMAL HIGH (ref 10–35)
Albumin: 3.2 g/dL — ABNORMAL LOW (ref 3.6–5.1)
Alkaline phosphatase (APISO): 149 U/L (ref 37–153)
Bilirubin, Direct: 0.4 mg/dL — ABNORMAL HIGH (ref 0.0–0.2)
Globulin: 4.3 g/dL (calc) — ABNORMAL HIGH (ref 1.9–3.7)
Indirect Bilirubin: 0.7 mg/dL (calc) (ref 0.2–1.2)
Total Bilirubin: 1.1 mg/dL (ref 0.2–1.2)
Total Protein: 7.5 g/dL (ref 6.1–8.1)

## 2020-04-01 LAB — ANA, IFA COMPREHENSIVE PANEL
Anti Nuclear Antibody (ANA): NEGATIVE
ENA SM Ab Ser-aCnc: <1 AI
SM/RNP: >8 AI — AB
SSA (Ro) (ENA) Antibody, IgG: >8 AI — AB
SSB (La) (ENA) Antibody, IgG: <1 AI
Scleroderma (Scl-70) (ENA) Antibody, IgG: <1 AI
ds DNA Ab: <1 IU/mL

## 2020-04-01 LAB — IGG, IGA, IGM
IgG (Immunoglobin G), Serum: 2472 mg/dL — ABNORMAL HIGH (ref 600–1640)
IgM, Serum: 77 mg/dL (ref 50–300)
Immunoglobulin A: 827 mg/dL — ABNORMAL HIGH (ref 47–310)

## 2020-04-01 LAB — MITOCHONDRIAL ANTIBODIES: Mitochondrial M2 Ab, IgG: <=20 U

## 2020-04-01 LAB — PROTIME-INR
INR: 1.1
Prothrombin Time: 11.8 s — ABNORMAL HIGH (ref 9.0–11.5)

## 2020-04-09 ENCOUNTER — Telehealth (INDEPENDENT_AMBULATORY_CARE_PROVIDER_SITE_OTHER): Payer: Self-pay | Admitting: Gastroenterology

## 2020-04-09 DIAGNOSIS — R7989 Other specified abnormal findings of blood chemistry: Secondary | ICD-10-CM

## 2020-04-09 NOTE — Telephone Encounter (Signed)
Patient left voice mail message regarding test results - please advise - ph# [phone]

## 2020-04-09 NOTE — Telephone Encounter (Signed)
I called patient to discuss labs-she is agreeable to liver biopsy as recommended by Dr. Laural Golden, will place order and patient aware cone will call her to schedule.

## 2020-04-14 NOTE — Telephone Encounter (Signed)
Duplicate encounter. I discussed results w/ patient.

## 2020-04-18 ENCOUNTER — Other Ambulatory Visit: Payer: Self-pay | Admitting: Radiology

## 2020-04-21 ENCOUNTER — Other Ambulatory Visit: Payer: Self-pay | Admitting: Physician Assistant

## 2020-04-21 ENCOUNTER — Encounter (HOSPITAL_COMMUNITY): Payer: Self-pay

## 2020-04-21 ENCOUNTER — Other Ambulatory Visit: Payer: Self-pay

## 2020-04-21 ENCOUNTER — Ambulatory Visit (HOSPITAL_COMMUNITY)
Admission: RE | Admit: 2020-04-21 | Discharge: 2020-04-21 | Disposition: A | Discharge status: Home / Self Care | Payer: BC Managed Care – PPO | Source: Ambulatory Visit | Attending: Gastroenterology | Admitting: Gastroenterology

## 2020-04-21 DIAGNOSIS — Z79899 Other long term (current) drug therapy: Secondary | ICD-10-CM | POA: Diagnosis not present

## 2020-04-21 DIAGNOSIS — R7989 Other specified abnormal findings of blood chemistry: Secondary | ICD-10-CM

## 2020-04-21 DIAGNOSIS — Z6841 Body Mass Index (BMI) 40.0 and over, adult: Secondary | ICD-10-CM | POA: Diagnosis not present

## 2020-04-21 DIAGNOSIS — I10 Essential (primary) hypertension: Secondary | ICD-10-CM | POA: Insufficient documentation

## 2020-04-21 DIAGNOSIS — K7581 Nonalcoholic steatohepatitis (NASH): Secondary | ICD-10-CM | POA: Insufficient documentation

## 2020-04-21 DIAGNOSIS — K219 Gastro-esophageal reflux disease without esophagitis: Secondary | ICD-10-CM | POA: Diagnosis not present

## 2020-04-21 DIAGNOSIS — Z9884 Bariatric surgery status: Secondary | ICD-10-CM | POA: Diagnosis not present

## 2020-04-21 DIAGNOSIS — K7460 Unspecified cirrhosis of liver: Secondary | ICD-10-CM | POA: Insufficient documentation

## 2020-04-21 DIAGNOSIS — Z90710 Acquired absence of both cervix and uterus: Secondary | ICD-10-CM | POA: Insufficient documentation

## 2020-04-21 DIAGNOSIS — F419 Anxiety disorder, unspecified: Secondary | ICD-10-CM | POA: Insufficient documentation

## 2020-04-21 DIAGNOSIS — R748 Abnormal levels of other serum enzymes: Secondary | ICD-10-CM | POA: Diagnosis present

## 2020-04-21 LAB — CBC
HCT: 45.3 % (ref 36.0–46.0)
Hemoglobin: 14.2 g/dL (ref 12.0–15.0)
MCH: 23.8 pg — ABNORMAL LOW (ref 26.0–34.0)
MCHC: 31.3 g/dL (ref 30.0–36.0)
MCV: 75.9 fL — ABNORMAL LOW (ref 80.0–100.0)
Platelets: 185 10*3/uL (ref 150–400)
RBC: 5.97 MIL/uL — ABNORMAL HIGH (ref 3.87–5.11)
RDW: 16.4 % — ABNORMAL HIGH (ref 11.5–15.5)
WBC: 7.2 10*3/uL (ref 4.0–10.5)
nRBC: 0.3 % — ABNORMAL HIGH (ref 0.0–0.2)

## 2020-04-21 LAB — PROTIME-INR
INR: 1.2 (ref 0.8–1.2)
Prothrombin Time: 14.4 seconds (ref 11.4–15.2)

## 2020-04-21 LAB — APTT: aPTT: 30 seconds (ref 24–36)

## 2020-04-21 MED ORDER — FENTANYL CITRATE (PF) 100 MCG/2ML IJ SOLN
INTRAMUSCULAR | Status: AC
  Filled 2020-04-21: qty 2

## 2020-04-21 MED ORDER — MIDAZOLAM HCL 2 MG/2ML IJ SOLN
INTRAMUSCULAR | Status: AC | PRN
  Administered 2020-04-21: 1 mg via INTRAVENOUS

## 2020-04-21 MED ORDER — MIDAZOLAM HCL 2 MG/2ML IJ SOLN
INTRAMUSCULAR | Status: AC
  Filled 2020-04-21: qty 2

## 2020-04-21 MED ORDER — FENTANYL CITRATE (PF) 100 MCG/2ML IJ SOLN
INTRAMUSCULAR | Status: AC | PRN
  Administered 2020-04-21: 50 ug via INTRAVENOUS

## 2020-04-21 MED ORDER — GELATIN ABSORBABLE 12-7 MM EX MISC
CUTANEOUS | Status: AC
  Filled 2020-04-21: qty 1

## 2020-04-21 MED ORDER — LIDOCAINE HCL (PF) 1 % IJ SOLN
INTRAMUSCULAR | Status: AC
  Filled 2020-04-21: qty 30

## 2020-04-21 MED ORDER — SODIUM CHLORIDE 0.9 % IV SOLN: INTRAVENOUS | Status: DC

## 2020-04-21 NOTE — Procedures (Signed)
Interventional Radiology Procedure Note  Procedure: US guided biopsy of liver biopsy, medical liver Complications: None EBL: None Recommendations: - Bedrest 2 hours.   - Routine wound care - Follow up pathology - Advance diet   Signed,  Corrie Mckusick, DO

## 2020-04-21 NOTE — Progress Notes (Signed)
Patient and daughter was given discharge instructions. Both verbalized understanding. 

## 2020-04-21 NOTE — H&P (Signed)
Chief Complaint: Patient was seen in consultation today for liver core biopsy at the request of Woodard,Janice A Dr Dutch Quint  Referring Physician(s): Laurine Blazer A  Supervising Physician: Corrie Mckusick  Patient Status: [facility] - Out-pt  History of Present Illness: Holly Alvarado is a 54 y.o. female   Elevated liver enzymes For liver core biopsy today per Dr Laural Golden  MD note 03/30/20: Patient is 54 year old Afro-American female who has history of elevated transaminases of 20 years duration.  Viral markers for hepatitis B and C- on more than one occasion.  She underwent bariatric surgery in December 2015 with weight loss but transaminases have remained elevated. She had biochemical studies for other liver diseases last year and studies were negative except for positive ANA.  Unfortunately she remains with BMI of over 56. I agree one has to be worried about progression of her fatty liver disease to cirrhosis and timeline about 20 years is about right. It would be reasonable to rescreen for autoimmune hepatitis as well as PBC again. If the studies are inconclusive would consider liver biopsy.  Now scheduled for biopsy  Past Medical History:  Diagnosis Date  . Allergic rhinitis, seasonal   . Anemia    hx of   . Anxiety    hx of panic attacks and panic attacks with surgery   . Anxiety disorder   . Chronic constipation   . Complication of anesthesia    woke up during surgery of tubal ligation   . Esophageal polyp   . Fatty liver   . GERD (gastroesophageal reflux disease)   . Hemorrhoids   . Hypertension   . Obesity     Past Surgical History:  Procedure Laterality Date  . BLT  1987  . BREAST BIOPSY Right 2010   benign  . BREATH TEK H PYLORI N/A 02/25/2014   Procedure: BREATH TEK H PYLORI;  Surgeon: Pedro Earls, MD;  Location: [facility];  Service: General;  Laterality: N/A;  . CHOLECYSTECTOMY  1997  . COLONOSCOPY  12/30/2010  . COLONOSCOPY N/A 11/23/2017    Procedure: COLONOSCOPY;  Surgeon: Rogene Houston, MD;  Location: [facility];  Service: Endoscopy;  Laterality: N/A;  1130  . [place]  . 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 MOUTH SURGERY     wisdom tooth extraction  . PARTIAL HYSTERECTOMY    . TOTAL ABDOMINAL HYSTERECTOMY  03/11/08   for fibroids   . TUBAL LIGATION    . UPPER GASTROINTESTINAL ENDOSCOPY  12/30/2010  . UPPER GASTROINTESTINAL ENDOSCOPY  05/11/04  . UPPER GASTROINTESTINAL ENDOSCOPY  04/30/97   ANWAR    Allergies: Codeine, Sulfa antibiotics, Tape, and Sulfonamide derivatives  Medications: Prior to Admission medications   Medication Sig Start Date End Date Taking? Authorizing Provider  acetaminophen (TYLENOL) 500 MG tablet Take 500 mg by mouth every 6 (six) hours as needed.   Yes [provider]  ALPRAZolam (XANAX) 0.5 MG tablet Take 1 tablet (0.5 mg total) by mouth 2 (two) times daily as needed for anxiety. 04/27/19  Yes Cloria Spring, MD  amLODipine (NORVASC) 2.5 MG tablet TAKE 1 TABLET BY MOUTH EVERY DAY 02/11/20  Yes [name], MD  Calcium Carb-Cholecalciferol (CALTRATE 600+D3 SOFT) 600-800 MG-UNIT CHEW Chew 1 Piece by mouth daily.   Yes [provider]  clotrimazole-betamethasone (  LOTRISONE) cream Apply to ras(es)h twice daily for 10 days then as needed 08/01/19  Yes Perlie Mayo, NP  diphenhydrAMINE (BENADRYL) 50 MG capsule Take 50 mg by mouth as needed. Patient states that she will take 25 to 50 mg depending upon symptoms.   Yes [provider]  levocetirizine (XYZAL) 5 MG tablet Take 1 tablet (5 mg total) by mouth every evening. 11/07/19  Yes [name], MD  metoprolol tartrate (LOPRESSOR) 50 MG tablet TAKE 1 TABLET BY MOUTH TWICE A DAY 05/11/19  Yes [name], MD  Multiple  Vitamin (MULTI-VITAMIN) tablet Take by mouth daily.    Yes [provider]  pantoprazole (PROTONIX) 40 MG tablet Take 1 tablet (40 mg total) by mouth daily. 01/30/20  Yes [name], MD  Potassium 99 MG TABS Take 1 tablet by mouth daily.   Yes [provider]  venlafaxine XR (EFFEXOR XR) 75 MG 24 hr capsule Take 1 capsule (75 mg total) by mouth daily. 04/27/19 04/26/20 Yes Cloria Spring, MD  vitamin B-12 (CYANOCOBALAMIN) 1000 MCG tablet Take 1,000 mcg by mouth daily.   Yes [provider]  albuterol (VENTOLIN HFA) 108 (90 Base) MCG/ACT inhaler Inhale 2 puffs into the lungs every 6 (six) hours as needed for wheezing or shortness of breath. 08/01/19   Perlie Mayo, NP  methocarbamol (ROBAXIN) 750 MG tablet Take on tablet once daily as needed, for back spasm 01/23/19   [name], MD  traZODone (DESYREL) 50 MG tablet TAKE 1 TABLET BY MOUTH EVERYDAY AT BEDTIME 04/27/19   Cloria Spring, MD  venlafaxine XR (EFFEXOR-XR) 150 MG 24 hr capsule Take 1 capsule (150 mg total) by mouth daily. 04/27/19 04/26/20  Cloria Spring, MD  venlafaxine XR (EFFEXOR-XR) 75 MG 24 hr capsule Take 1 capsule (75 mg total) by mouth daily with breakfast. Patient not taking: Reported on 03/27/2020 09/11/19   [name], MD     Family History  Problem Relation Age of Onset  . Hypertension Mother   . COPD Mother   . Depression Mother   . Hyperlipidemia Father   . Hypertension Father   . Depression Father   . Alcohol abuse Father   . Drug abuse Father   . Hyperlipidemia Sister   . Hypertension Sister   . Hyperlipidemia Sister   . Hypertension Sister   . Diabetes Other        family history   . Asthma Other        family history   . Arthritis Other        family history   . Stroke Other   . Cancer Other   . Heart disease Other   . Schizophrenia Maternal Uncle   . Colon cancer Maternal Aunt   . Colon cancer Maternal Aunt   . Colon cancer Maternal Aunt   . Colon  cancer Maternal Aunt   . Colon cancer Maternal Aunt   . Colon cancer Maternal Aunt     Social History   Socioeconomic History  . Marital status: Married    Spouse name: Not on file  . Number of children: 2  . Years of education: college  . Highest education level: Not on file  Occupational History  . Occupation: Barista   Tobacco Use  . Smoking status: Never Smoker  . Smokeless tobacco: Never Used  Vaping Use  . Vaping Use: Never used  Substance and Sexual Activity  . Alcohol  use: No    Alcohol/week: 0.0 standard drinks  . Drug use: No  . Sexual activity: Yes    Birth control/protection: Surgical  Other Topics Concern  . Not on file  Social History Narrative  . Not on file   Social Determinants of Health   Financial Resource Strain:   . Difficulty of Paying Living Expenses:   Food Insecurity:   . Worried About Charity fundraiser in the Last Year:   . Arboriculturist in the Last Year:   Transportation Needs:   . Film/video editor (Medical):   Marland Kitchen Lack of Transportation (Non-Medical):   Physical Activity:   . Days of Exercise per Week:   . Minutes of Exercise per Session:   Stress:   . Feeling of Stress :   Social Connections:   . Frequency of Communication with Friends and Family:   . Frequency of Social Gatherings with Friends and Family:   . Attends Religious Services:   . Active Member of Clubs or Organizations:   . Attends Archivist Meetings:   Marland Kitchen Marital Status:      Review of Systems: A 12 point ROS discussed and pertinent positives are indicated in the HPI above.  All other systems are negative.  Review of Systems  Constitutional: Negative for activity change and fever.  Respiratory: Negative for cough and shortness of breath.   Cardiovascular: Negative for chest pain.  Gastrointestinal: Negative for abdominal pain.  Psychiatric/Behavioral: Negative for behavioral problems and confusion.    Vital Signs: BP (!)  153/86   Pulse 71   Temp 97.9 F (36.6 C) (Oral)   Resp 16   Ht 5' 2"  (1.575 m)   Wt (!) 307 lb (139.3 kg)   SpO2 100%   BMI 56.15 kg/m   Physical Exam Vitals reviewed.  Cardiovascular:     Rate and Rhythm: Normal rate and regular rhythm.     Heart sounds: Normal heart sounds.  Pulmonary:     Effort: Pulmonary effort is normal.     Breath sounds: Normal breath sounds.  Abdominal:     Palpations: Abdomen is soft.  Musculoskeletal:        General: Normal range of motion.  Skin:    General: Skin is warm and dry.  Neurological:     Mental Status: She is alert and oriented to person, place, and time.  Psychiatric:        Behavior: Behavior normal.     Imaging: No results found.  Labs:  CBC: Recent Labs    12/12/19 0727 04/21/20 1033  WBC 6.3 7.2  HGB 13.3 14.2  HCT 41.1 45.3  PLT 154 185    COAGS: Recent Labs    03/27/20 1150 04/21/20 1033  INR 1.1 1.2  APTT  --  30    BMP: Recent Labs    12/12/19 0727  NA 141  K 3.7  CL 109  CO2 27  GLUCOSE 111*  BUN 9  CALCIUM 8.8  CREATININE 0.62  GFRNONAA 103  GFRAA 119    LIVER FUNCTION TESTS: Recent Labs    12/12/19 0727 03/27/20 1150  BILITOT 1.0 1.1  AST 103* 102*  ALT 49* 61*  PROT 7.4 7.5    TUMOR MARKERS: No results for input(s): AFPTM, CEA, CA199, CHROMGRNA in the last 8760 hours.  Assessment and Plan:  Elevated liver enzymes - long standing Fatty liver Scheduled for liver core biopsy Risks and benefits of liver core biopsy was  discussed with the patient and/or patient's family including, but not limited to bleeding, infection, damage to adjacent structures or low yield requiring additional tests.  All of the questions were answered and there is agreement to proceed. Consent signed and in chart.   Thank you for this interesting consult.  I greatly enjoyed meeting BETZAIDA CREMEENS and look forward to participating in their care.  A copy of this report was sent to the requesting  provider on this date.  Electronically Signed: Lavonia Drafts, PA-C 04/21/2020, 11:31 AM   I spent a total of  30 Minutes   in face to face in clinical consultation, greater than 50% of which was counseling/coordinating care for liver core biopsy

## 2020-04-21 NOTE — Discharge Instructions (Signed)
Liver Biopsy, Care After These instructions give you information on caring for yourself after your procedure. Your doctor may also give you more specific instructions. Call your doctor if you have any problems or questions after your procedure. What can I expect after the procedure? After the procedure, it is common to have:  Pain and soreness where the biopsy was done.  Bruising around the area where the biopsy was done.  Sleepiness and be tired for a few days. Follow these instructions at home: Medicines  Take over-the-counter and prescription medicines only as told by your doctor.  If you were prescribed an antibiotic medicine, take it as told by your doctor. Do not stop taking the antibiotic even if you start to feel better.  Do not take medicines such as aspirin and ibuprofen. These medicines can thin your blood. Do not take these medicines unless your doctor tells you to take them.  If you are taking prescription pain medicine, take actions to prevent or treat constipation. Your doctor may recommend that you: ? Drink enough fluid to keep your pee (urine) clear or pale yellow. ? Take over-the-counter or prescription medicines. ? Eat foods that are high in fiber, such as fresh fruits and vegetables, whole grains, and beans. ? Limit foods that are high in fat and processed sugars, such as fried and sweet foods. Caring for your cut  Follow instructions from your doctor about how to take care of your cuts from surgery (incisions). Make sure you: ? Wash your hands with soap and water before you change your bandage (dressing). If you cannot use soap and water, use hand sanitizer. ? Change your bandage as told by your doctor. ? Leave stitches (sutures), skin glue, or skin tape (adhesive) strips in place. They may need to stay in place for 2 weeks or longer. If tape strips get loose and curl up, you may trim the loose edges. Do not remove tape strips completely unless your doctor says it is  okay.  Check your cuts every day for signs of infection. Check for: ? Redness, swelling, or more pain. ? Fluid or blood. ? Pus or a bad smell. ? Warmth.  Do not take baths, swim, or use a hot tub until your doctor says it is okay to do so. Activity   Rest at home for 1-2 days or as told by your doctor. ? Avoid sitting for a long time without moving. Get up to take short walks every 1-2 hours.  Return to your normal activities as told by your doctor. Ask what activities are safe for you.  Do not do these things in the first 24 hours: ? Drive. ? Use machinery. ? Take a bath or shower.  Do not lift more than 10 pounds (4.5 kg) or play contact sports for the first 2 weeks. General instructions   Do not drink alcohol in the first week after the procedure.  Have someone stay with you for at least 24 hours after the procedure.  Get your test results. Ask your doctor or the department that is doing the test: ? When will my results be ready? ? How will I get my results? ? What are my treatment options? ? What other tests do I need? ? What are my next steps?  Keep all follow-up visits as told by your doctor. This is important. Contact a doctor if:  A cut bleeds and leaves more than just a small spot of blood.  A cut is red, puffs up (  swells), or hurts more than before.  Fluid or something else comes from a cut.  A cut smells bad.  You have a fever or chills. Get help right away if:  You have swelling, bloating, or pain in your belly (abdomen).  You get dizzy or faint.  You have a rash.  You feel sick to your stomach (nauseous) or throw up (vomit).  You have trouble breathing, feel short of breath, or feel faint.  Your chest hurts.  You have problems talking or seeing.  You have trouble with your balance or moving your arms or legs. Summary  After the procedure, it is common to have pain, soreness, bruising, and tiredness.  Your doctor will tell you how to  take care of yourself at home. Change your bandage, take your medicines, and limit your activities as told by your doctor.  Call your doctor if you have symptoms of infection. Get help right away if your belly swells, your cut bleeds a lot, or you have trouble talking or breathing. This information is not intended to replace advice given to you by your health care provider. Make sure you discuss any questions you have with your health care provider. Document Revised: 09/23/2017 Document Reviewed: 09/23/2017 Elsevier Patient Education  2020 [employer].

## 2020-04-22 LAB — SURGICAL PATHOLOGY

## 2020-05-06 ENCOUNTER — Encounter (INDEPENDENT_AMBULATORY_CARE_PROVIDER_SITE_OTHER): Payer: Self-pay

## 2020-05-06 ENCOUNTER — Other Ambulatory Visit: Payer: Self-pay | Admitting: Family Medicine

## 2020-05-06 ENCOUNTER — Other Ambulatory Visit (HOSPITAL_COMMUNITY): Payer: Self-pay | Admitting: Psychiatry

## 2020-05-07 ENCOUNTER — Other Ambulatory Visit (HOSPITAL_COMMUNITY): Payer: Self-pay | Admitting: Psychiatry

## 2020-05-08 ENCOUNTER — Ambulatory Visit (INDEPENDENT_AMBULATORY_CARE_PROVIDER_SITE_OTHER): Payer: BC Managed Care – PPO | Admitting: Internal Medicine

## 2020-05-08 ENCOUNTER — Other Ambulatory Visit: Payer: Self-pay

## 2020-05-08 ENCOUNTER — Encounter (INDEPENDENT_AMBULATORY_CARE_PROVIDER_SITE_OTHER): Payer: Self-pay | Admitting: Internal Medicine

## 2020-05-08 DIAGNOSIS — K7460 Unspecified cirrhosis of liver: Secondary | ICD-10-CM | POA: Insufficient documentation

## 2020-05-08 NOTE — Patient Instructions (Addendum)
Consultation.  Ms. Alcus Dad,, dietitian to be arranged Increase physical activity as tolerated.  Consider joining [facility]. Blood work to be done prior to next visit. Can try OTC Nexium 20 mg daily 30 minutes before breakfast in place of pantoprazole.

## 2020-05-08 NOTE — Progress Notes (Signed)
Presenting complaint;  Follow-up for chronic liver disease. Discussed the results of liver biopsy dated 04/21/2020.  Database and subjective:  Patient is 54 year old African-American female in [place] who has over 20-year history of elevated transaminases felt to be due to NASH.  She had an ultrasound back in November 2002 which revealed fatty liver.  Over the years she has tried to be very active. 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  Following her gastric bypass surgery her hemoglobin A1c decreased to 5.6 from presurgery level of 6.33.  Transaminases decreased but never returned to normal. Screening tests for AIH, PBC, alpha 1 antitrypsin deficiency or iron overload were negative in February last year except for positive ANA. AMA immunoglobulins SMA ANA were repeated last month and these were unremarkable. We recommended liver biopsy which she underwent uneventfully on 04/21/2020. Liver biopsy shows steatohepatitis with cirrhosis.  Disease activity is grade 2 or 3 and stage is 4.  Therefore she has advanced cirrhosis. She is having difficulty lose weight.  Since February last year she is only lost 5 pounds.  She says she has not been able to do much walking because of left leg pain radiating and numbness in her foot and she was diagnosed with spinal stenosis.  In spite of this she is trying to walk as much as she can. She says heartburn is not well controlled.  Esomeprazole worked the best but her insurance would not cover.  She also complains of pain in right upper quadrant which radiates posteriorly.  She seems to have more pain with physical activity. Her bowels move almost daily.  She is having to take MiraLAX 2-3 times in a month.  She denies melena or rectal bleeding. She has received hepatitis a and B vaccination in the past.  She is planning to get a booster shot for hepatitis A. She also has received both doses of Covid vaccine  manufactured by [employer].   Current Medications: Outpatient Encounter Medications as of 05/08/2020  Medication Sig  . albuterol (VENTOLIN HFA) 108 (90 Base) MCG/ACT inhaler Inhale 2 puffs into the lungs every 6 (six) hours as needed for wheezing or shortness of breath.  . ALPRAZolam (XANAX) 0.5 MG tablet Take 1 tablet (0.5 mg total) by mouth 2 (two) times daily as needed for anxiety.  Marland Kitchen amLODipine (NORVASC) 2.5 MG tablet TAKE 1 TABLET BY MOUTH EVERY DAY  . Calcium Carb-Cholecalciferol (CALTRATE 600+D3 SOFT) 600-800 MG-UNIT CHEW Chew 1 Piece by mouth daily.  . clotrimazole-betamethasone (LOTRISONE) cream Apply to ras(es)h twice daily for 10 days then as needed  . diphenhydrAMINE (BENADRYL) 50 MG capsule Take 50 mg by mouth as needed. Patient states that she will take 25 to 50 mg depending upon symptoms.  . methocarbamol (ROBAXIN) 750 MG tablet Take on tablet once daily as needed, for back spasm  . metoprolol tartrate (LOPRESSOR) 50 MG tablet TAKE 1 TABLET BY MOUTH TWICE A DAY  . Multiple Vitamin (MULTI-VITAMIN) tablet Take by mouth daily.   . pantoprazole (PROTONIX) 40 MG tablet Take 1 tablet (40 mg total) by mouth daily.  . Potassium 99 MG TABS Take 1 tablet by mouth daily.  . traZODone (DESYREL) 50 MG tablet TAKE 1 TABLET BY MOUTH EVERYDAY AT BEDTIME  . venlafaxine XR (EFFEXOR XR) 75 MG 24 hr capsule Take 1 capsule (75 mg total) by mouth daily.  Marland Kitchen venlafaxine XR (EFFEXOR-XR) 150 MG 24 hr capsule Take 1 capsule (150 mg total) by  mouth daily.  . vitamin B-12 (CYANOCOBALAMIN) 1000 MCG tablet Take 1,000 mcg by mouth daily.  Marland Kitchen acetaminophen (TYLENOL) 500 MG tablet Take 500 mg by mouth every 6 (six) hours as needed. (Patient not taking: Reported on 05/08/2020)  . levocetirizine (XYZAL) 5 MG tablet Take 1 tablet (5 mg total) by mouth every evening. (Patient not taking: Reported on 05/08/2020)  . venlafaxine XR (EFFEXOR-XR) 75 MG 24 hr capsule TAKE 1 CAPSULE (75 MG TOTAL) BY MOUTH DAILY WITH BREAKFAST.  (Patient not taking: Reported on 05/08/2020)   No facility-administered encounter medications on file as of 05/08/2020.     Objective: Blood pressure (!) 155/101, pulse 76, temperature 98.5 F (36.9 C), temperature source Oral, height 5' 2"  (1.575 m), weight (!) 307 lb 3.2 oz (139.3 kg). Patient is alert and in no acute distress. She is wearing a mask. She does not have asterixis. Conjunctiva is pink. Sclera is nonicteric Oropharyngeal mucosa is normal. No neck masses or thyromegaly noted. Cardiac exam with regular rhythm normal S1 and S2. No murmur or gallop noted. Lungs are clear to auscultation. Abdomen is obese with laparoscopy scars.  On palpation it soft and nontender without organomegaly or masses. She has none pitting pretibial edema in both legs.  Labs/studies Results:  CBC Latest Ref Rng & Units 04/21/2020 12/12/2019 08/17/2018  WBC 4.0 - 10.5 K/uL 7.2 6.3 7.4  Hemoglobin 12.0 - 15.0 g/dL 14.2 13.3 13.4  Hematocrit 36 - 46 % 45.3 41.1 42.5  Platelets 150 - 400 K/uL 185 154 192    CMP Latest Ref Rng & Units 03/27/2020 12/12/2019 11/13/2018  Glucose 65 - 99 mg/dL - 111(H) -  BUN 7 - 25 mg/dL - 9 -  Creatinine 0.50 - 1.05 mg/dL - 0.62 -  Sodium 135 - 146 mmol/L - 141 -  Potassium 3.5 - 5.3 mmol/L - 3.7 -  Chloride 98 - 110 mmol/L - 109 -  CO2 20 - 32 mmol/L - 27 -  Calcium 8.6 - 10.4 mg/dL - 8.8 -  Total Protein 6.1 - 8.1 g/dL 7.5 7.4 7.6  Total Bilirubin 0.2 - 1.2 mg/dL 1.1 1.0 0.8  Alkaline Phos 33 - 130 U/L - - -  AST 10 - 35 U/L 102(H) 103(H) 92(H)  ALT 6 - 29 U/L 61(H) 49(H) 41(H)    Hepatic Function Latest Ref Rng & Units 03/27/2020 12/12/2019 11/13/2018  Total Protein 6.1 - 8.1 g/dL 7.5 7.4 7.6  Albumin 3.6 - 5.1 g/dL - - -  AST 10 - 35 U/L 102(H) 103(H) 92(H)  ALT 6 - 29 U/L 61(H) 49(H) 41(H)  Alk Phosphatase 33 - 130 U/L - - -  Total Bilirubin 0.2 - 1.2 mg/dL 1.1 1.0 0.8  Bilirubin, Direct 0.0 - 0.2 mg/dL 0.4(H) - 0.3(H)      Assessment:  #1.  Fatty liver  disease.  She has had elevated transaminases for at least 20 years.  Liver biopsy reveals stage IV disease and what is worrisome is that she has ongoing active inflammation which is worrisome.  She must decrease calorie intake and consider physical activity other than walking.  She may join [employer] so that she could swim.  She can also do exercise while sitting.  #2.  GERD.  Symptom control is not satisfactory with pantoprazole.  She will try OTC esomeprazole.   Plan:  Dietary consultation with Ms. Jearld Fenton, RD as soon as possible.  She needs help with food selection and calorie control. Patient advised to consider joining [employer] where  she could do physical activity other than walking which she is not able to do at the present time because of back issues. Try Nexium OTC 20 mg by mouth 30 minutes before breakfast in place of pantoprazole. Patient will have LFTs in 3 months prior to office visit. Will arrange for esophagogastroduodenoscopy to screen for esophageal varices following her next visit. We will also arrange for abdominal ultrasound after next visit.

## 2020-05-12 ENCOUNTER — Telehealth (INDEPENDENT_AMBULATORY_CARE_PROVIDER_SITE_OTHER): Payer: BC Managed Care – PPO | Admitting: Family Medicine

## 2020-05-12 ENCOUNTER — Other Ambulatory Visit: Payer: Self-pay

## 2020-05-12 VITALS — BP 155/100 | HR 78 | Ht 62.0 in | Wt 307.0 lb

## 2020-05-12 DIAGNOSIS — F411 Generalized anxiety disorder: Secondary | ICD-10-CM | POA: Diagnosis not present

## 2020-05-12 DIAGNOSIS — I10 Essential (primary) hypertension: Secondary | ICD-10-CM

## 2020-05-12 DIAGNOSIS — F418 Other specified anxiety disorders: Secondary | ICD-10-CM | POA: Diagnosis not present

## 2020-05-12 DIAGNOSIS — K7460 Unspecified cirrhosis of liver: Secondary | ICD-10-CM

## 2020-05-12 MED ORDER — ALPRAZOLAM 0.5 MG PO TABS: ORAL_TABLET | ORAL | 0 refills | Status: DC

## 2020-05-12 MED ORDER — ORLISTAT 120 MG PO CAPS: 120.0000 mg | ORAL_CAPSULE | Freq: Three times a day (TID) | ORAL | 3 refills | Status: DC

## 2020-05-12 NOTE — Progress Notes (Signed)
Virtual Visit via Telephone Note  I connected with Holly Alvarado on 05/25/20 at  3:40 PM EDT by telephone and verified that I am speaking with the correct person using two identifiers.  Location: Patient: home Provider: office   I discussed the limitations, risks, security and privacy concerns of performing an evaluation and management service by telephone and the availability of in person appointments. I also discussed with the patient that there may be a patient responsible charge related to this service. The patient expressed understanding and agreed to proceed.   History of Present Illness:   f/U chronic problems. New diagnosis of cirrhosis, needs to lose weight ,a dnhas been referred to dietician by GI, requests medication to help wit weight loss, has had bariatric surgery in the past Denies recent fever or chills. Denies sinus pressure, nasal congestion, ear pain or sore throat. Denies chest congestion, productive cough or wheezing. Denies chest pains, palpitations and leg swelling Denies abdominal pain, nausea, vomiting,diarrhea or constipation.   Denies dysuria, frequency, hesitancy or incontinence. Denies uncontrolled joint pain, swelling and limitation in mobility. Denies headaches, seizures, numbness, or tingling. Increased anxiety and mild depression with new diagnosis Denies skin break down or rash.     Observations/Objective: BP (!) 155/100   Pulse 78   Ht 5' 2"  (1.575 m)   Wt (!) 307 lb (139.3 kg)   BMI 56.15 kg/m  Good communication with no confusion and intact memory. Alert and oriented x 3 No signs of respiratory distress during speech    Assessment and Plan: GAD (generalized anxiety disorder) Increased and uncontrolled, concerned about new dx of cirrhosis. Low dose xanax , short term  Essential hypertension Elevated at recent visits, needs in office evaluation  DASH diet and commitment to daily physical activity for a minimum of 30 minutes discussed  and encouraged, as a part of hypertension management. The importance of attaining a healthy weight is also discussed.  BP/Weight 05/12/2020 05/08/2020 04/21/2020 03/27/2020 02/07/2020 09/11/2019 16/0/1093  Systolic BP 235 573 220 254 270 623 762  Diastolic BP 831 517 78 73 70 82 88  Wt. (Lbs) 307 307.2 307 307.2 312 312.12 312.04  BMI 56.15 56.19 56.15 56.19 51.92 57.09 57.07  Some encounter information is confidential and restricted. Go to Review Flowsheets activity to see all data.       Depression with anxiety Increased and uncontrolled, wil benefit from therapy, will discuss at follow up, not suicidal or homicidal  Morbid obesity ([place]) Deteriorating, start xenical, meet with dietitian as arranged by GI  Patient re-educated about  the importance of commitment to a  minimum of 150 minutes of exercise per week as able.  The importance of healthy food choices with portion control discussed, as well as eating regularly and within a 12 hour window most days. The need to choose "clean , green" food 50 to 75% of the time is discussed, as well as to make water the primary drink and set a goal of 64 ounces water daily.    Weight /BMI 05/12/2020 05/08/2020 04/21/2020  WEIGHT 307 lb 307 lb 3.2 oz 307 lb  HEIGHT 5' 2"  5' 2"  5' 2"   BMI 56.15 kg/m2 56.19 kg/m2 56.15 kg/m2  Some encounter information is confidential and restricted. Go to Review Flowsheets activity to see all data.      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 The  patient was provided an opportunity to ask questions and all were answered. The patient agreed with the plan and demonstrated an understanding of the instructions.   The patient was advised to call back or seek an in-person evaluation if the symptoms worsen or if the condition fails to improve as anticipated.  I provided 15  minutes of  non-face-to-face time during this encounter.   [name], MD

## 2020-05-12 NOTE — Patient Instructions (Addendum)
F/u in office in 6 weeks with mD, re evaluate weight and blood pressure, call if you need me sooner, TDaP due  1500 cal sheet is sent to you  Xenical is prescribed to help with weight ;loss   Very important you see  Dietitian  Limited xanax is prescribed It is important that you exercise regularly at least 30 minutes 5 times a week. If you develop chest pain, have severe difficulty breathing, or feel very tired, stop exercising immediately and seek medical attention  Think about what you will eat, plan ahead.f/u chronic problems. New diagnosis of ci Choose " clean, green, fresh or frozen" over canned, processed or packaged foods which are more sugary, salty and fatty. 70 to 75% of food eaten should be vegetables and fruit. Three meals at set times with snacks allowed between meals, but they must be fruit or vegetables. Aim to eat over a 12 hour period , example 7 am to 7 pm, and STOP after  your last meal of the day. Drink water,generally about 64 ounces per day, no other drink is as healthy. Fruit juice is best enjoyed in a healthy way, by EATING the fruit. Thanks for choosing [facility], we consider it a privelige to serve you.

## 2020-05-21 ENCOUNTER — Ambulatory Visit: Payer: BC Managed Care – PPO | Admitting: Family Medicine

## 2020-05-25 ENCOUNTER — Encounter: Payer: Self-pay | Admitting: Family Medicine

## 2020-05-25 NOTE — Assessment & Plan Note (Signed)
Elevated at recent visits, needs in office evaluation  DASH diet and commitment to daily physical activity for a minimum of 30 minutes discussed and encouraged, as a part of hypertension management. The importance of attaining a healthy weight is also discussed.  BP/Weight 05/12/2020 05/08/2020 04/21/2020 03/27/2020 02/07/2020 09/11/2019 86/01/1685  Systolic BP 104 247 319 243 836 542 715  Diastolic BP 664 830 78 73 70 82 88  Wt. (Lbs) 307 307.2 307 307.2 312 312.12 312.04  BMI 56.15 56.19 56.15 56.19 51.92 57.09 57.07  Some encounter information is confidential and restricted. Go to Review Flowsheets activity to see all data.

## 2020-05-25 NOTE — Assessment & Plan Note (Signed)
Increased and uncontrolled, wil benefit from therapy, will discuss at follow up, not suicidal or homicidal

## 2020-05-25 NOTE — Assessment & Plan Note (Signed)
New diagnosis, being followed by GI

## 2020-05-25 NOTE — Assessment & Plan Note (Signed)
Deteriorating, start xenical, meet with dietitian as arranged by GI  Patient re-educated about  the importance of commitment to a  minimum of 150 minutes of exercise per week as able.  The importance of healthy food choices with portion control discussed, as well as eating regularly and within a 12 hour window most days. The need to choose "clean , green" food 50 to 75% of the time is discussed, as well as to make water the primary drink and set a goal of 64 ounces water daily.    Weight /BMI 05/12/2020 05/08/2020 04/21/2020  WEIGHT 307 lb 307 lb 3.2 oz 307 lb  HEIGHT 5' 2"  5' 2"  5' 2"   BMI 56.15 kg/m2 56.19 kg/m2 56.15 kg/m2  Some encounter information is confidential and restricted. Go to Review Flowsheets activity to see all data.

## 2020-05-25 NOTE — Assessment & Plan Note (Addendum)
Increased and uncontrolled, concerned about new dx of cirrhosis. Low dose xanax , short term

## 2020-07-19 ENCOUNTER — Other Ambulatory Visit: Payer: Self-pay | Admitting: Family Medicine

## 2020-08-01 ENCOUNTER — Other Ambulatory Visit: Payer: Self-pay

## 2020-08-01 ENCOUNTER — Ambulatory Visit (INDEPENDENT_AMBULATORY_CARE_PROVIDER_SITE_OTHER): Payer: BC Managed Care – PPO

## 2020-08-01 DIAGNOSIS — Z23 Encounter for immunization: Secondary | ICD-10-CM | POA: Diagnosis not present

## 2020-08-04 ENCOUNTER — Ambulatory Visit: Payer: BC Managed Care – PPO

## 2020-08-18 ENCOUNTER — Encounter (INDEPENDENT_AMBULATORY_CARE_PROVIDER_SITE_OTHER): Payer: Self-pay | Admitting: *Deleted

## 2020-08-18 ENCOUNTER — Encounter (INDEPENDENT_AMBULATORY_CARE_PROVIDER_SITE_OTHER): Payer: Self-pay

## 2020-08-25 ENCOUNTER — Ambulatory Visit (INDEPENDENT_AMBULATORY_CARE_PROVIDER_SITE_OTHER): Payer: BC Managed Care – PPO | Admitting: Nurse Practitioner

## 2020-08-25 ENCOUNTER — Other Ambulatory Visit: Payer: Self-pay

## 2020-08-25 ENCOUNTER — Encounter: Payer: Self-pay | Admitting: Nurse Practitioner

## 2020-08-25 VITALS — BP 156/90 | HR 70 | Temp 97.8°F | Ht 62.0 in | Wt 298.0 lb

## 2020-08-25 DIAGNOSIS — B369 Superficial mycosis, unspecified: Secondary | ICD-10-CM

## 2020-08-25 DIAGNOSIS — R21 Rash and other nonspecific skin eruption: Secondary | ICD-10-CM | POA: Diagnosis not present

## 2020-08-25 MED ORDER — CLOTRIMAZOLE-BETAMETHASONE 1-0.05 % EX CREA: TOPICAL_CREAM | CUTANEOUS | 1 refills | Status: DC

## 2020-08-25 NOTE — Progress Notes (Signed)
Acute Office Visit  Subjective:    Patient ID: Holly Alvarado, female    DOB: [birth year], 54 y.o.   MRN: [id number]  Chief Complaint  Patient presents with   Rash    lower abdomen, redness and itching x3 days    HPI Patient is in today for itchy red rash to her lower abdomen/pannis.  She has been putting lotrisone cream on it, and it has been looking better but still itching.  She is running out of lotrisone cream.  She states the rash burns some.  Past Medical History:  Diagnosis Date   Allergic rhinitis, seasonal    Anemia    hx of    Anxiety    hx of panic attacks and panic attacks with surgery    Anxiety disorder    Chronic constipation    Complication of anesthesia    woke up during surgery of tubal ligation    Esophageal polyp    Fatty liver    GERD (gastroesophageal reflux disease)    Hemorrhoids    Hypertension    Obesity     Past Surgical History:  Procedure Laterality Date   BLT  1987   BREAST BIOPSY Right 2010   benign   BREATH TEK H PYLORI N/A 02/25/2014   Procedure: BREATH TEK H PYLORI;  Surgeon: Pedro Earls, MD;  Location: [facility];  Service: General;  Laterality: N/A;   CHOLECYSTECTOMY  1997   COLONOSCOPY  12/30/2010   COLONOSCOPY N/A 11/23/2017   Procedure: COLONOSCOPY;  Surgeon: Rogene Houston, MD;  Location: [facility];  Service: Endoscopy;  Laterality: N/A;  [place], 1995   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   Procedure: LAPAROSCOPIC ROUX-EN-Y GASTRIC BYPASS WITH UPPER ENDOSCOPY;  Surgeon: Pedro Earls, MD;  Location: [facility];  Service: General;  Laterality: N/A;   MOUTH SURGERY     wisdom tooth extraction   PARTIAL HYSTERECTOMY     TOTAL ABDOMINAL HYSTERECTOMY  03/11/08   for fibroids    TUBAL LIGATION     UPPER GASTROINTESTINAL ENDOSCOPY  12/30/2010   UPPER GASTROINTESTINAL ENDOSCOPY  05/11/04   UPPER GASTROINTESTINAL ENDOSCOPY  04/30/97   ANWAR    Family History    Problem Relation Age of Onset   Hypertension Mother    COPD Mother    Depression Mother    Hyperlipidemia Father    Hypertension Father    Depression Father    Alcohol abuse Father    Drug abuse Father    Hyperlipidemia Sister    Hypertension Sister    Hyperlipidemia Sister    Hypertension Sister    Diabetes Other        family history    Asthma Other        family history    Arthritis Other        family history    Stroke Other    Cancer Other    Heart disease Other    Schizophrenia Maternal Uncle    Colon cancer Maternal Aunt    Colon cancer Maternal Aunt    Colon cancer Maternal Aunt    Colon cancer Maternal Aunt    Colon cancer Maternal Aunt    Colon cancer Maternal Aunt     Social History   Socioeconomic History   Marital status: Married    Spouse name: Not on file   Number of children: 2   Years of education: college   Highest education level:  Not on file  Occupational History   Occupation: Barista   Tobacco Use   Smoking status: Never Smoker   Smokeless tobacco: Never Used  Vaping Use   Vaping Use: Never used  Substance and Sexual Activity   Alcohol use: No    Alcohol/week: 0.0 standard drinks   Drug use: No   Sexual activity: Yes    Birth control/protection: Surgical  Other Topics Concern   Not on file  Social History Narrative   Not on file   Social Determinants of Health   Financial Resource Strain:    Difficulty of Paying Living Expenses: Not on file  Food Insecurity:    Worried About Charity fundraiser in the Last Year: Not on file   [employer] of Food in the Last Year: Not on file  Transportation Needs:    Lack of Transportation (Medical): Not on file   Lack of Transportation (Non-Medical): Not on file  Physical Activity:    Days of Exercise per Week: Not on file   Minutes of Exercise per Session: Not on file  Stress:    Feeling of Stress : Not on file  Social  Connections:    Frequency of Communication with Friends and Family: Not on file   Frequency of Social Gatherings with Friends and Family: Not on file   Attends Religious Services: Not on file   Active Member of Clubs or Organizations: Not on file   Attends Archivist Meetings: Not on file   Marital Status: Not on file  Intimate Partner Violence:    Fear of Current or Ex-Partner: Not on file   Emotionally Abused: Not on file   Physically Abused: Not on file   Sexually Abused: Not on file    Outpatient Medications Prior to Visit  Medication Sig Dispense Refill   acetaminophen (TYLENOL) 500 MG tablet Take 500 mg by mouth every 6 (six) hours as needed.      albuterol (VENTOLIN HFA) 108 (90 Base) MCG/ACT inhaler Inhale 2 puffs into the lungs every 6 (six) hours as needed for wheezing or shortness of breath. 18 g 1   ALPRAZolam (XANAX) 0.5 MG tablet Take one tablet by mouth once daily as needed for anxiety 15 tablet 0   amLODipine (NORVASC) 2.5 MG tablet TAKE 1 TABLET BY MOUTH EVERY DAY 90 tablet 3   Calcium Carb-Cholecalciferol (CALTRATE 600+D3 SOFT) 600-800 MG-UNIT CHEW Chew 1 Piece by mouth daily.     clotrimazole-betamethasone (LOTRISONE) cream Apply to ras(es)h twice daily for 10 days then as needed 45 g 1   diphenhydrAMINE (BENADRYL) 50 MG capsule Take 50 mg by mouth as needed. Patient states that she will take 25 to 50 mg depending upon symptoms.     methocarbamol (ROBAXIN) 750 MG tablet Take on tablet once daily as needed, for back spasm 30 tablet 1   metoprolol tartrate (LOPRESSOR) 50 MG tablet TAKE 1 TABLET BY MOUTH TWICE A DAY 180 tablet 1   Multiple Vitamin (MULTI-VITAMIN) tablet Take by mouth daily.      orlistat (XENICAL) 120 MG capsule Take 1 capsule (120 mg total) by mouth 3 (three) times daily with meals. 90 capsule 3   pantoprazole (PROTONIX) 40 MG tablet Take 1 tablet (40 mg total) by mouth daily. 90 tablet 1   Potassium 99 MG TABS Take 1  tablet by mouth daily.     venlafaxine XR (EFFEXOR-XR) 75 MG 24 hr capsule TAKE 1 CAPSULE (75 MG TOTAL) BY MOUTH  DAILY WITH BREAKFAST. 90 capsule 1   vitamin B-12 (CYANOCOBALAMIN) 1000 MCG tablet Take 1,000 mcg by mouth daily.     No facility-administered medications prior to visit.    Allergies  Allergen Reactions   Codeine     Severe headache.  Other reaction(s): Headache   Sulfa Antibiotics Hives   Tape Rash    Foam tape - caused blisters and sever itching   Sulfonamide Derivatives Hives and Itching    Review of Systems  Skin: Positive for rash.       Per HPI       Objective:    Physical Exam Skin:    Findings: Rash present.     Comments: Macular red rash to lower abdomen approx 4x6 inches just below her navel; this rash crosses the center line; signs of scratching present     BP (!) 156/90 (BP Location: Right Arm, Patient Position: Sitting, Cuff Size: Normal)    Pulse 70    Temp 97.8 F (36.6 C) (Temporal)    Ht 5' 2"  (1.575 m)    Wt 298 lb (135.2 kg)    SpO2 98%    BMI 54.50 kg/m  Wt Readings from Last 3 Encounters:  08/25/20 298 lb (135.2 kg)  05/25/20 (!) 307 lb (139.3 kg)  05/08/20 (!) 307 lb 3.2 oz (139.3 kg)    There are no preventive care reminders to display for this patient.  There are no preventive care reminders to display for this patient.   Lab Results  Component Value Date   TSH 0.69 03/08/2018   Lab Results  Component Value Date   WBC 7.2 04/21/2020   HGB 14.2 04/21/2020   HCT 45.3 04/21/2020   MCV 75.9 (L) 04/21/2020   PLT 185 04/21/2020   Lab Results  Component Value Date   NA 141 12/12/2019   K 3.7 12/12/2019   CO2 27 12/12/2019   GLUCOSE 111 (H) 12/12/2019   BUN 9 12/12/2019   CREATININE 0.62 12/12/2019   BILITOT 1.1 03/27/2020   ALKPHOS 125 02/01/2017   AST 102 (H) 03/27/2020   ALT 61 (H) 03/27/2020   PROT 7.5 03/27/2020   ALBUMIN 3.3 (L) 02/01/2017   CALCIUM 8.8 12/12/2019   ANIONGAP 10 08/28/2014   Lab Results   Component Value Date   CHOL 133 12/12/2019   Lab Results  Component Value Date   HDL 28 (L) 12/12/2019   Lab Results  Component Value Date   LDLCALC 85 12/12/2019   Lab Results  Component Value Date   TRIG 100 12/12/2019   Lab Results  Component Value Date   CHOLHDL 4.8 12/12/2019   Lab Results  Component Value Date   HGBA1C 6.1 (H) 12/12/2019       Assessment & Plan:   Problem List Items Addressed This Visit      Musculoskeletal and Integument   Rash - Primary    -she has had fungal infections in the past -Refilled lotrisone cream since it has been helping -if no improvement in a week, return to clinic; will consider abx or dermatology referral at that time          No orders of the defined types were placed in this encounter.    Noreene Larsson, NP

## 2020-08-25 NOTE — Patient Instructions (Signed)
Return to clinic in 1 week if no improvement; sooner if rash gets worse

## 2020-08-25 NOTE — Assessment & Plan Note (Signed)
-  she has had fungal infections in the past -Refilled lotrisone cream since it has been helping -if no improvement in a week, return to clinic; will consider abx or dermatology referral at that time

## 2020-08-26 ENCOUNTER — Ambulatory Visit: Payer: BC Managed Care – PPO | Admitting: Family Medicine

## 2020-08-26 ENCOUNTER — Ambulatory Visit (INDEPENDENT_AMBULATORY_CARE_PROVIDER_SITE_OTHER): Payer: BC Managed Care – PPO | Admitting: Gastroenterology

## 2020-09-04 ENCOUNTER — Ambulatory Visit (INDEPENDENT_AMBULATORY_CARE_PROVIDER_SITE_OTHER): Payer: BC Managed Care – PPO | Admitting: Gastroenterology

## 2020-09-04 ENCOUNTER — Telehealth (INDEPENDENT_AMBULATORY_CARE_PROVIDER_SITE_OTHER): Payer: Self-pay

## 2020-09-04 NOTE — Telephone Encounter (Signed)
Patient no showed for her appt 09/04/2020

## 2020-09-04 NOTE — Telephone Encounter (Signed)
noted 

## 2020-09-22 ENCOUNTER — Other Ambulatory Visit: Payer: Self-pay

## 2020-09-22 ENCOUNTER — Ambulatory Visit: Payer: BC Managed Care – PPO | Admitting: Nurse Practitioner

## 2020-09-22 ENCOUNTER — Encounter: Payer: Self-pay | Admitting: Nurse Practitioner

## 2020-09-22 VITALS — BP 132/76 | HR 71 | Temp 98.4°F | Resp 18 | Ht 62.0 in | Wt 297.0 lb

## 2020-09-22 DIAGNOSIS — Z0000 Encounter for general adult medical examination without abnormal findings: Secondary | ICD-10-CM | POA: Diagnosis not present

## 2020-09-22 DIAGNOSIS — I10 Essential (primary) hypertension: Secondary | ICD-10-CM | POA: Diagnosis not present

## 2020-09-22 NOTE — Progress Notes (Signed)
Acute Office Visit  Subjective:    Patient ID: Holly Alvarado, female    DOB: Jun 19, 1966, 54 y.o.   MRN: [id number]  Chief Complaint  Patient presents with  . Hypertension  . Follow-up    HPI Patient is in today for BP check.  She states her BP gets up to 140/80, but she has not had any SBP readings in the 150s.  She usually has SBP readings in the 120s and 130s.  Her BP was elevated at her last appt on 08/26/20, and she wanted to have it checked today to make sure her home readings have been accurate.  Past Medical History:  Diagnosis Date  . Allergic rhinitis, seasonal   . Anemia    hx of   . Anxiety    hx of panic attacks and panic attacks with surgery   . Anxiety disorder   . Chronic constipation   . Complication of anesthesia    woke up during surgery of tubal ligation   . Esophageal polyp   . Fatty liver   . GERD (gastroesophageal reflux disease)   . Hemorrhoids   . Hypertension   . Obesity     Past Surgical History:  Procedure Laterality Date  . BLT  1987  . BREAST BIOPSY Right 2010   benign  . BREATH TEK H PYLORI N/A 02/25/2014   Procedure: BREATH TEK H PYLORI;  Surgeon: Pedro Earls, MD;  Location: [facility];  Service: General;  Laterality: N/A;  . CHOLECYSTECTOMY  1997  . COLONOSCOPY  12/30/2010  . COLONOSCOPY N/A 11/23/2017   Procedure: COLONOSCOPY;  Surgeon: Rogene Houston, MD;  Location: [facility];  Service: Endoscopy;  Laterality: N/A;  1130  . [place]  . 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 MOUTH SURGERY     wisdom tooth extraction  . PARTIAL HYSTERECTOMY    . TOTAL ABDOMINAL HYSTERECTOMY  03/11/08   for fibroids   . TUBAL LIGATION    . UPPER GASTROINTESTINAL ENDOSCOPY  12/30/2010  . UPPER GASTROINTESTINAL ENDOSCOPY  05/11/04  . UPPER  GASTROINTESTINAL ENDOSCOPY  04/30/97   ANWAR    Family History  Problem Relation Age of Onset  . Hypertension Mother   . COPD Mother   . Depression Mother   . Hyperlipidemia Father   . Hypertension Father   . Depression Father   . Alcohol abuse Father   . Drug abuse Father   . Hyperlipidemia Sister   . Hypertension Sister   . Hyperlipidemia Sister   . Hypertension Sister   . Diabetes Other        family history   . Asthma Other        family history   . Arthritis Other        family history   . Stroke Other   . Cancer Other   . Heart disease Other   . Schizophrenia Maternal Uncle   . Colon cancer Maternal Aunt   . Colon cancer Maternal Aunt   . Colon cancer Maternal Aunt   . Colon cancer Maternal Aunt   . Colon cancer Maternal Aunt   . Colon cancer Maternal Aunt     Social History   Socioeconomic History  . Marital status: Married    Spouse name: Not on file  . Number of  children: 2  . Years of education: college  . Highest education level: Not on file  Occupational History  . Occupation: Barista   Tobacco Use  . Smoking status: Never Smoker  . Smokeless tobacco: Never Used  Vaping Use  . Vaping Use: Never used  Substance and Sexual Activity  . Alcohol use: No    Alcohol/week: 0.0 standard drinks  . Drug use: No  . Sexual activity: Yes    Birth control/protection: Surgical  Other Topics Concern  . Not on file  Social History Narrative  . Not on file   Social Determinants of Health   Financial Resource Strain: Not on file  Food Insecurity: Not on file  Transportation Needs: Not on file  Physical Activity: Not on file  Stress: Not on file  Social Connections: Not on file  Intimate Partner Violence: Not on file    Outpatient Medications Prior to Visit  Medication Sig Dispense Refill  . acetaminophen (TYLENOL) 500 MG tablet Take 500 mg by mouth every 6 (six) hours as needed.     Marland Kitchen albuterol (VENTOLIN HFA) 108 (90 Base)  MCG/ACT inhaler Inhale 2 puffs into the lungs every 6 (six) hours as needed for wheezing or shortness of breath. 18 g 1  . ALPRAZolam (XANAX) 0.5 MG tablet Take one tablet by mouth once daily as needed for anxiety 15 tablet 0  . amLODipine (NORVASC) 2.5 MG tablet TAKE 1 TABLET BY MOUTH EVERY DAY 90 tablet 3  . Calcium Carb-Cholecalciferol 600-800 MG-UNIT CHEW Chew 1 Piece by mouth daily.    . clotrimazole-betamethasone (LOTRISONE) cream Apply to ras(es)h twice daily for 10 days then as needed 45 g 1  . diphenhydrAMINE (BENADRYL) 50 MG capsule Take 50 mg by mouth as needed. Patient states that she will take 25 to 50 mg depending upon symptoms.    . methocarbamol (ROBAXIN) 750 MG tablet Take on tablet once daily as needed, for back spasm 30 tablet 1  . metoprolol tartrate (LOPRESSOR) 50 MG tablet TAKE 1 TABLET BY MOUTH TWICE A DAY 180 tablet 1  . Multiple Vitamin (MULTI-VITAMIN) tablet Take by mouth daily.     Marland Kitchen orlistat (XENICAL) 120 MG capsule Take 1 capsule (120 mg total) by mouth 3 (three) times daily with meals. 90 capsule 3  . pantoprazole (PROTONIX) 40 MG tablet Take 1 tablet (40 mg total) by mouth daily. 90 tablet 1  . Potassium 99 MG TABS Take 1 tablet by mouth daily.    Marland Kitchen venlafaxine XR (EFFEXOR-XR) 75 MG 24 hr capsule TAKE 1 CAPSULE (75 MG TOTAL) BY MOUTH DAILY WITH BREAKFAST. 90 capsule 1  . vitamin B-12 (CYANOCOBALAMIN) 1000 MCG tablet Take 1,000 mcg by mouth daily.     No facility-administered medications prior to visit.    Allergies  Allergen Reactions  . Codeine     Severe headache.  Other reaction(s): Headache  . Sulfa Antibiotics Hives  . Tape Rash    Foam tape - caused blisters and sever itching  . Sulfonamide Derivatives Hives and Itching    Review of Systems  Constitutional: Negative.   Respiratory: Negative.   Cardiovascular: Negative.   Musculoskeletal: Negative.   Psychiatric/Behavioral: Negative.        Objective:    Physical Exam Constitutional:       Appearance: She is obese.  Cardiovascular:     Rate and Rhythm: Normal rate and regular rhythm.     Pulses: Normal pulses.     Heart sounds: Normal  heart sounds.  Pulmonary:     Effort: Pulmonary effort is normal.     Breath sounds: Normal breath sounds.  Musculoskeletal:        General: Normal range of motion.  Neurological:     Mental Status: She is alert.  Psychiatric:        Mood and Affect: Mood normal.        Behavior: Behavior normal.        Thought Content: Thought content normal.        Judgment: Judgment normal.     BP 132/76   Pulse 71   Temp 98.4 F (36.9 C)   Resp 18   Ht 5' 2"  (1.575 m)   Wt 297 lb (134.7 kg)   SpO2 97%   BMI 54.32 kg/m  Wt Readings from Last 3 Encounters:  09/22/20 297 lb (134.7 kg)  08/25/20 298 lb (135.2 kg)  05/25/20 (!) 307 lb (139.3 kg)    Health Maintenance Due  Topic Date Due  . COVID-19 Vaccine (3 - Booster for Pfizer series) 06/24/2020    There are no preventive care reminders to display for this patient.   Lab Results  Component Value Date   TSH 0.69 03/08/2018   Lab Results  Component Value Date   WBC 7.2 04/21/2020   HGB 14.2 04/21/2020   HCT 45.3 04/21/2020   MCV 75.9 (L) 04/21/2020   PLT 185 04/21/2020   Lab Results  Component Value Date   NA 141 12/12/2019   K 3.7 12/12/2019   CO2 27 12/12/2019   GLUCOSE 111 (H) 12/12/2019   BUN 9 12/12/2019   CREATININE 0.62 12/12/2019   BILITOT 1.1 03/27/2020   ALKPHOS 125 02/01/2017   AST 102 (H) 03/27/2020   ALT 61 (H) 03/27/2020   PROT 7.5 03/27/2020   ALBUMIN 3.3 (L) 02/01/2017   CALCIUM 8.8 12/12/2019   ANIONGAP 10 08/28/2014   Lab Results  Component Value Date   CHOL 133 12/12/2019   Lab Results  Component Value Date   HDL 28 (L) 12/12/2019   Lab Results  Component Value Date   LDLCALC 85 12/12/2019   Lab Results  Component Value Date   TRIG 100 12/12/2019   Lab Results  Component Value Date   CHOLHDL 4.8 12/12/2019   Lab Results   Component Value Date   HGBA1C 6.1 (H) 12/12/2019       Assessment & Plan:   Problem List Items Addressed This Visit      Cardiovascular and Mediastinum   Essential hypertension    -BP well controlled today -continue metoprolol and amlodipine -no changes to meds today      Relevant Orders   CBC with Differential/Platelet   CMP14+EGFR   Lipid Panel With LDL/HDL Ratio    Other Visit Diagnoses    Routine medical exam    -  Primary   Relevant Orders   CBC with Differential/Platelet   CMP14+EGFR   Lipid Panel With LDL/HDL Ratio       No orders of the defined types were placed in this encounter.    Noreene Larsson, NP

## 2020-09-22 NOTE — Assessment & Plan Note (Addendum)
-  BP well controlled today -continue metoprolol and amlodipine -no changes to meds today

## 2020-09-22 NOTE — Patient Instructions (Addendum)
We will see you back in about 3 months for an annual physical.  Please bring any old photos of Holly Alvarado and the other LPNs/CMAs, especially good ones for teasing them.

## 2020-09-24 ENCOUNTER — Telehealth (INDEPENDENT_AMBULATORY_CARE_PROVIDER_SITE_OTHER): Payer: Self-pay | Admitting: Internal Medicine

## 2020-09-24 NOTE — Telephone Encounter (Signed)
According to Dr. Thornton Papas elastography not consistent with cirrhosis kPa was less than 5. Patient informed. 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

## 2020-09-24 NOTE — Telephone Encounter (Signed)
Spoke with patient she would like to discuss her u/s with you. Per Dr. Laural Golden he will call the patient.

## 2020-09-24 NOTE — Telephone Encounter (Signed)
I tried calling the patient, no answer as the patient vm was full.

## 2020-09-24 NOTE — Telephone Encounter (Signed)
Patient called the office for an appointment - stated she didn't know about her last appointment until after she no showed - patient is very hard to get in touch with - gave patient an appointment for 01/20/21 - states she doesn't think she should wait that long - she doesn't want to see Dr Jenetta Downer - please advise - ph# [phone]

## 2020-10-16 ENCOUNTER — Telehealth: Payer: Self-pay | Admitting: Family Medicine

## 2020-10-16 ENCOUNTER — Other Ambulatory Visit: Payer: Self-pay | Admitting: Family Medicine

## 2020-10-16 ENCOUNTER — Other Ambulatory Visit: Payer: Self-pay

## 2020-10-16 ENCOUNTER — Telehealth (INDEPENDENT_AMBULATORY_CARE_PROVIDER_SITE_OTHER): Payer: Self-pay | Admitting: Nurse Practitioner

## 2020-10-16 ENCOUNTER — Encounter: Payer: Self-pay | Admitting: Nurse Practitioner

## 2020-10-16 DIAGNOSIS — J019 Acute sinusitis, unspecified: Secondary | ICD-10-CM

## 2020-10-16 MED ORDER — PROMETHAZINE-DM 6.25-15 MG/5ML PO SYRP
5.0000 mL | ORAL_SOLUTION | Freq: Four times a day (QID) | ORAL | 0 refills | Status: DC | PRN
Start: 2020-10-16 — End: 2021-01-07

## 2020-10-16 MED ORDER — AMOXICILLIN-POT CLAVULANATE 875-125 MG PO TABS: 1.0000 | ORAL_TABLET | Freq: Two times a day (BID) | ORAL | 0 refills | Status: DC

## 2020-10-16 MED ORDER — NOREL AD 4-10-325 MG PO TABS
1.0000 | ORAL_TABLET | ORAL | 1 refills | Status: DC | PRN
Start: 2020-10-16 — End: 2021-01-07

## 2020-10-16 NOTE — Assessment & Plan Note (Signed)
-  started 2 weeks ago -Rx. augmentin -Rx. norel -Rx. Promethazine-DM cough syrup -she has albuterol inhaler at home for PRN wheezing

## 2020-10-16 NOTE — Progress Notes (Signed)
Acute Office Visit  Subjective:    Patient ID: Holly Alvarado, female    DOB: Jan 07, 1966, 55 y.o.   MRN: [id number]  Chief Complaint  Patient presents with  . Sinusitis    Green with blood x 2 weeks   . Cough    X 2 weeks   . Headache    X 4 days  . Ear Pain    X 4 days     HPI Patient is in today for sick visit. She has been having cough and sinus pressure x 2 weeks, but headache and ear pain started 4 days ago.  She has tried taking claritin and nasal spray, tylenol and advil.   Past Medical History:  Diagnosis Date  . Allergic rhinitis, seasonal   . Anemia    hx of   . Anxiety    hx of panic attacks and panic attacks with surgery   . Anxiety disorder   . Chronic constipation   . Complication of anesthesia    woke up during surgery of tubal ligation   . Esophageal polyp   . Fatty liver   . GERD (gastroesophageal reflux disease)   . Hemorrhoids   . Hypertension   . Obesity     Past Surgical History:  Procedure Laterality Date  . BLT  1987  . BREAST BIOPSY Right 2010   benign  . BREATH TEK H PYLORI N/A 02/25/2014   Procedure: BREATH TEK H PYLORI;  Surgeon: Pedro Earls, MD;  Location: [facility];  Service: General;  Laterality: N/A;  . CHOLECYSTECTOMY  1997  . COLONOSCOPY  12/30/2010  . COLONOSCOPY N/A 11/23/2017   Procedure: COLONOSCOPY;  Surgeon: Rogene Houston, MD;  Location: [facility];  Service: Endoscopy;  Laterality: N/A;  1130  . [place]  . 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 MOUTH SURGERY     wisdom tooth extraction  . PARTIAL HYSTERECTOMY    . TOTAL ABDOMINAL HYSTERECTOMY  03/11/08   for fibroids   . TUBAL LIGATION    . UPPER GASTROINTESTINAL ENDOSCOPY  12/30/2010  . UPPER GASTROINTESTINAL ENDOSCOPY  05/11/04  . UPPER GASTROINTESTINAL ENDOSCOPY   04/30/97   ANWAR    Family History  Problem Relation Age of Onset  . Hypertension Mother   . COPD Mother   . Depression Mother   . Hyperlipidemia Father   . Hypertension Father   . Depression Father   . Alcohol abuse Father   . Drug abuse Father   . Hyperlipidemia Sister   . Hypertension Sister   . Hyperlipidemia Sister   . Hypertension Sister   . Diabetes Other        family history   . Asthma Other        family history   . Arthritis Other        family history   . Stroke Other   . Cancer Other   . Heart disease Other   . Schizophrenia Maternal Uncle   . Colon cancer Maternal Aunt   . Colon cancer Maternal Aunt   . Colon cancer Maternal Aunt   . Colon cancer Maternal Aunt   . Colon cancer Maternal Aunt   . Colon cancer Maternal Aunt     Social History   Socioeconomic History  . Marital status: Married  Spouse name: Not on file  . Number of children: 2  . Years of education: college  . Highest education level: Not on file  Occupational History  . Occupation: Barista   Tobacco Use  . Smoking status: Never Smoker  . Smokeless tobacco: Never Used  Vaping Use  . Vaping Use: Never used  Substance and Sexual Activity  . Alcohol use: No    Alcohol/week: 0.0 standard drinks  . Drug use: No  . Sexual activity: Yes    Birth control/protection: Surgical  Other Topics Concern  . Not on file  Social History Narrative  . Not on file   Social Determinants of Health   Financial Resource Strain: Not on file  Food Insecurity: Not on file  Transportation Needs: Not on file  Physical Activity: Not on file  Stress: Not on file  Social Connections: Not on file  Intimate Partner Violence: Not on file    Outpatient Medications Prior to Visit  Medication Sig Dispense Refill  . acetaminophen (TYLENOL) 500 MG tablet Take 500 mg by mouth every 6 (six) hours as needed.     Marland Kitchen albuterol (VENTOLIN HFA) 108 (90 Base) MCG/ACT inhaler Inhale 2 puffs  into the lungs every 6 (six) hours as needed for wheezing or shortness of breath. 18 g 1  . ALPRAZolam (XANAX) 0.5 MG tablet Take one tablet by mouth once daily as needed for anxiety 15 tablet 0  . amLODipine (NORVASC) 2.5 MG tablet TAKE 1 TABLET BY MOUTH EVERY DAY 90 tablet 3  . Calcium Carb-Cholecalciferol 600-800 MG-UNIT CHEW Chew 1 Piece by mouth daily.    . clotrimazole-betamethasone (LOTRISONE) cream Apply to ras(es)h twice daily for 10 days then as needed 45 g 1  . diphenhydrAMINE (BENADRYL) 50 MG capsule Take 50 mg by mouth as needed. Patient states that she will take 25 to 50 mg depending upon symptoms.    . methocarbamol (ROBAXIN) 750 MG tablet Take on tablet once daily as needed, for back spasm 30 tablet 1  . metoprolol tartrate (LOPRESSOR) 50 MG tablet TAKE 1 TABLET BY MOUTH TWICE A DAY 180 tablet 1  . Multiple Vitamin (MULTI-VITAMIN) tablet Take by mouth daily.     Marland Kitchen orlistat (XENICAL) 120 MG capsule Take 1 capsule (120 mg total) by mouth 3 (three) times daily with meals. 90 capsule 3  . pantoprazole (PROTONIX) 40 MG tablet Take 1 tablet (40 mg total) by mouth daily. 90 tablet 1  . Potassium 99 MG TABS Take 1 tablet by mouth daily.    Marland Kitchen venlafaxine XR (EFFEXOR-XR) 75 MG 24 hr capsule TAKE 1 CAPSULE (75 MG TOTAL) BY MOUTH DAILY WITH BREAKFAST. 90 capsule 1  . vitamin B-12 (CYANOCOBALAMIN) 1000 MCG tablet Take 1,000 mcg by mouth daily.     No facility-administered medications prior to visit.    Allergies  Allergen Reactions  . Codeine     Severe headache.  Other reaction(s): Headache  . Sulfa Antibiotics Hives  . Tape Rash    Foam tape - caused blisters and sever itching  . Sulfonamide Derivatives Hives and Itching    Review of Systems  Constitutional: Positive for chills, fatigue and fever.  HENT: Positive for congestion, ear pain, postnasal drip, rhinorrhea, sinus pressure, sinus pain and sore throat.        Pain in both ears, feels like she is "underwater"  Respiratory:  Positive for cough and wheezing. Negative for shortness of breath.        Used  inhaler 3x since symptoms started       Objective:    Physical Exam  There were no vitals taken for this visit. Wt Readings from Last 3 Encounters:  09/22/20 297 lb (134.7 kg)  08/25/20 298 lb (135.2 kg)  05/25/20 (!) 307 lb (139.3 kg)    Health Maintenance Due  Topic Date Due  . COVID-19 Vaccine (3 - Booster for Pfizer series) 06/24/2020    There are no preventive care reminders to display for this patient.   Lab Results  Component Value Date   TSH 0.69 03/08/2018   Lab Results  Component Value Date   WBC 7.2 04/21/2020   HGB 14.2 04/21/2020   HCT 45.3 04/21/2020   MCV 75.9 (L) 04/21/2020   PLT 185 04/21/2020   Lab Results  Component Value Date   NA 141 12/12/2019   K 3.7 12/12/2019   CO2 27 12/12/2019   GLUCOSE 111 (H) 12/12/2019   BUN 9 12/12/2019   CREATININE 0.62 12/12/2019   BILITOT 1.1 03/27/2020   ALKPHOS 125 02/01/2017   AST 102 (H) 03/27/2020   ALT 61 (H) 03/27/2020   PROT 7.5 03/27/2020   ALBUMIN 3.3 (L) 02/01/2017   CALCIUM 8.8 12/12/2019   ANIONGAP 10 08/28/2014   Lab Results  Component Value Date   CHOL 133 12/12/2019   Lab Results  Component Value Date   HDL 28 (L) 12/12/2019   Lab Results  Component Value Date   LDLCALC 85 12/12/2019   Lab Results  Component Value Date   TRIG 100 12/12/2019   Lab Results  Component Value Date   CHOLHDL 4.8 12/12/2019   Lab Results  Component Value Date   HGBA1C 6.1 (H) 12/12/2019       Assessment & Plan:   Problem List Items Addressed This Visit      Respiratory   Sinusitis    -started 2 weeks ago -Rx. augmentin -Rx. norel -Rx. Promethazine-DM cough syrup -she has albuterol inhaler at home for PRN wheezing      Relevant Medications   promethazine-dextromethorphan (PROMETHAZINE-DM) 6.25-15 MG/5ML syrup   amoxicillin-clavulanate (AUGMENTIN) 875-125 MG tablet   Chlorphen-PE-Acetaminophen (NOREL AD)  4-10-325 MG TABS       Meds ordered this encounter  Medications  . promethazine-dextromethorphan (PROMETHAZINE-DM) 6.25-15 MG/5ML syrup    Sig: Take 5 mLs by mouth 4 (four) times daily as needed for cough.    Dispense:  118 mL    Refill:  0  . amoxicillin-clavulanate (AUGMENTIN) 875-125 MG tablet    Sig: Take 1 tablet by mouth 2 (two) times daily.    Dispense:  20 tablet    Refill:  0  . Chlorphen-PE-Acetaminophen (NOREL AD) 4-10-325 MG TABS    Sig: Take 1 tablet by mouth every 4 (four) hours as needed (nasal congestion, cold symptoms).    Dispense:  20 tablet    Refill:  1   Date:  10/16/2020   Location of Patient: Home Location of Provider: Office Consent was obtain for visit to be over via telehealth. I verified that I am speaking with the correct person using two identifiers.  I connected with  Francina Ames on 10/16/20 via telephone and verified that I am speaking with the correct person using two identifiers.   I discussed the limitations of evaluation and management by telemedicine. The patient expressed understanding and agreed to proceed.  Time spent: 9 minutes   Noreene Larsson, NP

## 2020-10-27 ENCOUNTER — Other Ambulatory Visit: Payer: Self-pay | Admitting: Family Medicine

## 2020-11-05 ENCOUNTER — Encounter: Payer: Self-pay | Admitting: Nurse Practitioner

## 2020-11-05 ENCOUNTER — Other Ambulatory Visit: Payer: Self-pay

## 2020-11-05 ENCOUNTER — Telehealth (INDEPENDENT_AMBULATORY_CARE_PROVIDER_SITE_OTHER): Payer: Self-pay | Admitting: Nurse Practitioner

## 2020-11-05 DIAGNOSIS — J019 Acute sinusitis, unspecified: Secondary | ICD-10-CM

## 2020-11-05 MED ORDER — PREDNISONE 10 MG PO TABS: ORAL_TABLET | ORAL | 0 refills | Status: AC

## 2020-11-05 MED ORDER — DOXYCYCLINE HYCLATE 100 MG PO TABS: 100.0000 mg | ORAL_TABLET | Freq: Two times a day (BID) | ORAL | 0 refills | Status: DC

## 2020-11-05 NOTE — Assessment & Plan Note (Signed)
-  treating for post-COVID infection today -Rx. Doxycycline -Rx. Prednisone taper

## 2020-11-05 NOTE — Progress Notes (Signed)
Acute Office Visit  Subjective:    Patient ID: Holly Alvarado, female    DOB: 1965-12-15, 55 y.o.   MRN: [id number]  Chief Complaint  Patient presents with  . Sinusitis    Ongoing x2 weeks, since having Covid  . Headache    Ongoing x2 weeks, since having Covid  . Ear Pain    Ongoing x2 weeks, since having Covid  . Nausea    Ongoing x2 weeks, since having Covid    HPI Patient is in today for post-COVID symptoms. She was seen via televisit on 10/16/20, and at that point she had been tested for COVID and was positive 2 weeks prior.  So, in total she has been symptomatic for 1 month.  At her last visit she was treated with augmentin, norel, and promethazine-DM cough syrup. She states she has a foul taste in her mouth.  Past Medical History:  Diagnosis Date  . Allergic rhinitis, seasonal   . Anemia    hx of   . Anxiety    hx of panic attacks and panic attacks with surgery   . Anxiety disorder   . Chronic constipation   . Complication of anesthesia    woke up during surgery of tubal ligation   . Esophageal polyp   . Fatty liver   . GERD (gastroesophageal reflux disease)   . Hemorrhoids   . Hypertension   . Obesity     Past Surgical History:  Procedure Laterality Date  . BLT  1987  . BREAST BIOPSY Right 2010   benign  . BREATH TEK H PYLORI N/A 02/25/2014   Procedure: BREATH TEK H PYLORI;  Surgeon: Pedro Earls, MD;  Location: [facility];  Service: General;  Laterality: N/A;  . CHOLECYSTECTOMY  1997  . COLONOSCOPY  12/30/2010  . COLONOSCOPY N/A 11/23/2017   Procedure: COLONOSCOPY;  Surgeon: Rogene Houston, MD;  Location: [facility];  Service: Endoscopy;  Laterality: N/A;  1130  . [place]  . 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 MOUTH SURGERY     wisdom tooth extraction  .  PARTIAL HYSTERECTOMY    . TOTAL ABDOMINAL HYSTERECTOMY  03/11/08   for fibroids   . TUBAL LIGATION    . UPPER GASTROINTESTINAL ENDOSCOPY  12/30/2010  . UPPER GASTROINTESTINAL ENDOSCOPY  05/11/04  . UPPER GASTROINTESTINAL ENDOSCOPY  04/30/97   ANWAR    Family History  Problem Relation Age of Onset  . Hypertension Mother   . COPD Mother   . Depression Mother   . Hyperlipidemia Father   . Hypertension Father   . Depression Father   . Alcohol abuse Father   . Drug abuse Father   . Hyperlipidemia Sister   . Hypertension Sister   . Hyperlipidemia Sister   . Hypertension Sister   . Diabetes Other        family history   . Asthma Other        family history   . Arthritis Other        family history   . Stroke Other   . Cancer Other   . Heart disease Other   . Schizophrenia Maternal Uncle   . Colon cancer Maternal Aunt   . Colon cancer Maternal Aunt   . Colon cancer Maternal Aunt   . Colon cancer  Maternal Aunt   . Colon cancer Maternal Aunt   . Colon cancer Maternal Aunt     Social History   Socioeconomic History  . Marital status: Married    Spouse name: Not on file  . Number of children: 2  . Years of education: college  . Highest education level: Not on file  Occupational History  . Occupation: Barista   Tobacco Use  . Smoking status: Never Smoker  . Smokeless tobacco: Never Used  Vaping Use  . Vaping Use: Never used  Substance and Sexual Activity  . Alcohol use: No    Alcohol/week: 0.0 standard drinks  . Drug use: No  . Sexual activity: Yes    Birth control/protection: Surgical  Other Topics Concern  . Not on file  Social History Narrative  . Not on file   Social Determinants of Health   Financial Resource Strain: Not on file  Food Insecurity: Not on file  Transportation Needs: Not on file  Physical Activity: Not on file  Stress: Not on file  Social Connections: Not on file  Intimate Partner Violence: Not on file     Outpatient Medications Prior to Visit  Medication Sig Dispense Refill  . acetaminophen (TYLENOL) 500 MG tablet Take 500 mg by mouth every 6 (six) hours as needed.     Marland Kitchen albuterol (VENTOLIN HFA) 108 (90 Base) MCG/ACT inhaler Inhale 2 puffs into the lungs every 6 (six) hours as needed for wheezing or shortness of breath. 18 g 1  . ALPRAZolam (XANAX) 0.5 MG tablet TAKE ONE TABLET BY MOUTH ONCE DAILY AS NEEDED FOR ANXIETY 15 tablet 0  . amLODipine (NORVASC) 2.5 MG tablet TAKE 1 TABLET BY MOUTH EVERY DAY 90 tablet 3  . Calcium Carb-Cholecalciferol 600-800 MG-UNIT CHEW Chew 1 Piece by mouth daily.    . Chlorphen-PE-Acetaminophen (NOREL AD) 4-10-325 MG TABS Take 1 tablet by mouth every 4 (four) hours as needed (nasal congestion, cold symptoms). 20 tablet 1  . clotrimazole-betamethasone (LOTRISONE) cream Apply to ras(es)h twice daily for 10 days then as needed 45 g 1  . diphenhydrAMINE (BENADRYL) 50 MG capsule Take 50 mg by mouth as needed. Patient states that she will take 25 to 50 mg depending upon symptoms.    . methocarbamol (ROBAXIN) 750 MG tablet Take on tablet once daily as needed, for back spasm 30 tablet 1  . metoprolol tartrate (LOPRESSOR) 50 MG tablet TAKE 1 TABLET BY MOUTH TWICE A DAY 180 tablet 1  . Multiple Vitamin (MULTI-VITAMIN) tablet Take by mouth daily.     Marland Kitchen orlistat (XENICAL) 120 MG capsule Take 1 capsule (120 mg total) by mouth 3 (three) times daily with meals. 90 capsule 3  . pantoprazole (PROTONIX) 40 MG tablet Take 1 tablet (40 mg total) by mouth daily. 90 tablet 1  . Potassium 99 MG TABS Take 1 tablet by mouth daily.    . promethazine-dextromethorphan (PROMETHAZINE-DM) 6.25-15 MG/5ML syrup Take 5 mLs by mouth 4 (four) times daily as needed for cough. 118 mL 0  . venlafaxine XR (EFFEXOR-XR) 75 MG 24 hr capsule TAKE 1 CAPSULE (75 MG TOTAL) BY MOUTH DAILY WITH BREAKFAST. 90 capsule 1  . vitamin B-12 (CYANOCOBALAMIN) 1000 MCG tablet Take 1,000 mcg by mouth daily.    Marland Kitchen  amoxicillin-clavulanate (AUGMENTIN) 875-125 MG tablet Take 1 tablet by mouth 2 (two) times daily. 20 tablet 0   No facility-administered medications prior to visit.    Allergies  Allergen Reactions  . Codeine  Severe headache.  Other reaction(s): Headache  . Sulfa Antibiotics Hives  . Tape Rash    Foam tape - caused blisters and sever itching  . Sulfonamide Derivatives Hives and Itching    Review of Systems  HENT: Positive for ear pain, sinus pressure and sinus pain.   Gastrointestinal: Positive for nausea.  Neurological: Positive for headaches.       Objective:    Physical Exam  Ht 5' 2"  (1.575 m)   Wt 297 lb (134.7 kg)   BMI 54.32 kg/m  Wt Readings from Last 3 Encounters:  11/05/20 297 lb (134.7 kg)  09/22/20 297 lb (134.7 kg)  08/25/20 298 lb (135.2 kg)    Health Maintenance Due  Topic Date Due  . PAP SMEAR-Modifier  Never done    There are no preventive care reminders to display for this patient.   Lab Results  Component Value Date   TSH 0.69 03/08/2018   Lab Results  Component Value Date   WBC 7.2 04/21/2020   HGB 14.2 04/21/2020   HCT 45.3 04/21/2020   MCV 75.9 (L) 04/21/2020   PLT 185 04/21/2020   Lab Results  Component Value Date   NA 141 12/12/2019   K 3.7 12/12/2019   CO2 27 12/12/2019   GLUCOSE 111 (H) 12/12/2019   BUN 9 12/12/2019   CREATININE 0.62 12/12/2019   BILITOT 1.1 03/27/2020   ALKPHOS 125 02/01/2017   AST 102 (H) 03/27/2020   ALT 61 (H) 03/27/2020   PROT 7.5 03/27/2020   ALBUMIN 3.3 (L) 02/01/2017   CALCIUM 8.8 12/12/2019   ANIONGAP 10 08/28/2014   Lab Results  Component Value Date   CHOL 133 12/12/2019   Lab Results  Component Value Date   HDL 28 (L) 12/12/2019   Lab Results  Component Value Date   LDLCALC 85 12/12/2019   Lab Results  Component Value Date   TRIG 100 12/12/2019   Lab Results  Component Value Date   CHOLHDL 4.8 12/12/2019   Lab Results  Component Value Date   HGBA1C 6.1 (H)  12/12/2019       Assessment & Plan:   Problem List Items Addressed This Visit      Respiratory   Sinusitis    -treating for post-COVID infection today -Rx. Doxycycline -Rx. Prednisone taper      Relevant Medications   doxycycline (VIBRA-TABS) 100 MG tablet   predniSONE (DELTASONE) 10 MG tablet       Meds ordered this encounter  Medications  . doxycycline (VIBRA-TABS) 100 MG tablet    Sig: Take 1 tablet (100 mg total) by mouth 2 (two) times daily.    Dispense:  20 tablet    Refill:  0  . predniSONE (DELTASONE) 10 MG tablet    Sig: Take 6 tablets (60 mg total) by mouth daily with breakfast for 1 day, THEN 5 tablets (50 mg total) daily with breakfast for 1 day, THEN 4 tablets (40 mg total) daily with breakfast for 1 day, THEN 3 tablets (30 mg total) daily with breakfast for 1 day, THEN 2 tablets (20 mg total) daily with breakfast for 1 day, THEN 1 tablet (10 mg total) daily with breakfast for 1 day.    Dispense:  21 tablet    Refill:  0   Date:  11/05/2020   Location of Patient: Home Location of Provider: Office Consent was obtain for visit to be over via telehealth. I verified that I am speaking with the correct person using two  identifiers.  I connected with  Francina Ames on 11/05/20 via telephone and verified that I am speaking with the correct person using two identifiers.   I discussed the limitations of evaluation and management by telemedicine. The patient expressed understanding and agreed to proceed.  Time spent: 8 min   Noreene Larsson, NP

## 2020-11-18 ENCOUNTER — Other Ambulatory Visit: Payer: Self-pay | Admitting: Nurse Practitioner

## 2020-11-18 ENCOUNTER — Telehealth: Payer: Self-pay

## 2020-11-18 MED ORDER — MAGIC MOUTHWASH: 5.0000 mL | Freq: Four times a day (QID) | ORAL | 0 refills | Status: DC

## 2020-11-18 NOTE — Telephone Encounter (Signed)
Sent magic mouthwash to [facility]- they compound it

## 2020-11-18 NOTE — Telephone Encounter (Signed)
Pt informed

## 2020-11-18 NOTE — Telephone Encounter (Signed)
Patient called she is requesting a script for yeast in her mouth from the antibiotics ph# [phone]

## 2020-12-05 ENCOUNTER — Encounter: Payer: Self-pay | Admitting: Emergency Medicine

## 2020-12-05 ENCOUNTER — Ambulatory Visit
Admission: EM | Admit: 2020-12-05 | Discharge: 2020-12-05 | Disposition: A | Discharge status: Home / Self Care | Payer: BC Managed Care – PPO | Attending: Emergency Medicine | Admitting: Emergency Medicine

## 2020-12-05 ENCOUNTER — Other Ambulatory Visit: Payer: Self-pay

## 2020-12-05 DIAGNOSIS — L539 Erythematous condition, unspecified: Secondary | ICD-10-CM | POA: Diagnosis not present

## 2020-12-05 DIAGNOSIS — S80861A Insect bite (nonvenomous), right lower leg, initial encounter: Secondary | ICD-10-CM | POA: Diagnosis not present

## 2020-12-05 DIAGNOSIS — W57XXXA Bitten or stung by nonvenomous insect and other nonvenomous arthropods, initial encounter: Secondary | ICD-10-CM

## 2020-12-05 MED ORDER — DOXYCYCLINE HYCLATE 100 MG PO CAPS: 100.0000 mg | ORAL_CAPSULE | Freq: Two times a day (BID) | ORAL | 0 refills | Status: DC

## 2020-12-05 NOTE — ED Provider Notes (Signed)
[place]   [id number] 12/05/20 Arrival Time: 0921   Chief Complaint  Patient presents with  . Insect Bite     SUBJECTIVE: History from: family, patient.  Holly Alvarado is a 55 y.o. female who presented to the urgent care with a complaint of possible insect bite to right calf for the past week.  Speculate of possible spider bite.  Denies a precipitating event.  Localizes the bite to her right calf.  Has tried OTC hydrocortisone cream with mild relief.  Denies similar symptoms in the past.  Denies alleviating or aggravating factors.  Denies fever, chills, nausea, vomiting, headache, dizziness, weakness, fatigue, rash, or abdominal pain.   ROS: As per HPI.  All other pertinent ROS negative.     Past Medical History:  Diagnosis Date  . Allergic rhinitis, seasonal   . Anemia    hx of   . Anxiety    hx of panic attacks and panic attacks with surgery   . Anxiety disorder   . Chronic constipation   . Complication of anesthesia    woke up during surgery of tubal ligation   . Esophageal polyp   . Fatty liver   . GERD (gastroesophageal reflux disease)   . Hemorrhoids   . Hypertension   . Obesity    Past Surgical History:  Procedure Laterality Date  . BLT  1987  . BREAST BIOPSY Right 2010   benign  . BREATH TEK H PYLORI N/A 02/25/2014   Procedure: BREATH TEK H PYLORI;  Surgeon: Pedro Earls, MD;  Location: [facility];  Service: General;  Laterality: N/A;  . CHOLECYSTECTOMY  1997  . COLONOSCOPY  12/30/2010  . COLONOSCOPY N/A 11/23/2017   Procedure: COLONOSCOPY;  Surgeon: Rogene Houston, MD;  Location: [facility];  Service: Endoscopy;  Laterality: N/A;  1130  . [place]  . 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 MOUTH SURGERY     wisdom tooth extraction  . PARTIAL  HYSTERECTOMY    . TOTAL ABDOMINAL HYSTERECTOMY  03/11/08   for fibroids   . TUBAL LIGATION    . UPPER GASTROINTESTINAL ENDOSCOPY  12/30/2010  . UPPER GASTROINTESTINAL ENDOSCOPY  05/11/04  . UPPER GASTROINTESTINAL ENDOSCOPY  04/30/97   ANWAR   Allergies  Allergen Reactions  . Codeine     Severe headache.  Other reaction(s): Headache  . Sulfa Antibiotics Hives  . Tape Rash    Foam tape - caused blisters and sever itching  . Sulfonamide Derivatives Hives and Itching   No current facility-administered medications on file prior to encounter.   Current Outpatient Medications on File Prior to Encounter  Medication Sig Dispense Refill  . acetaminophen (TYLENOL) 500 MG tablet Take 500 mg by mouth every 6 (six) hours as needed.     Marland Kitchen albuterol (VENTOLIN HFA) 108 (90 Base) MCG/ACT inhaler Inhale 2 puffs into the lungs every 6 (six) hours as needed for wheezing or shortness of breath. 18 g 1  . ALPRAZolam (XANAX) 0.5 MG tablet TAKE ONE TABLET BY MOUTH ONCE DAILY AS NEEDED FOR ANXIETY 15 tablet 0  . amLODipine (NORVASC) 2.5 MG tablet TAKE 1 TABLET BY MOUTH EVERY DAY 90 tablet 3  . Calcium Carb-Cholecalciferol 600-800 MG-UNIT CHEW Chew 1 Piece by mouth daily.    . Chlorphen-PE-Acetaminophen (NOREL AD) 4-10-325 MG TABS  Take 1 tablet by mouth every 4 (four) hours as needed (nasal congestion, cold symptoms). 20 tablet 1  . clotrimazole-betamethasone (LOTRISONE) cream Apply to ras(es)h twice daily for 10 days then as needed 45 g 1  . diphenhydrAMINE (BENADRYL) 50 MG capsule Take 50 mg by mouth as needed. Patient states that she will take 25 to 50 mg depending upon symptoms.    Marland Kitchen doxycycline (VIBRA-TABS) 100 MG tablet Take 1 tablet (100 mg total) by mouth 2 (two) times daily. 20 tablet 0  . magic mouthwash SOLN Take 5 mLs by mouth 4 (four) times daily. Swish and swallow 1 teaspoon four times per day 240 mL 0  . methocarbamol (ROBAXIN) 750 MG tablet Take on tablet once daily as needed, for back spasm 30  tablet 1  . metoprolol tartrate (LOPRESSOR) 50 MG tablet TAKE 1 TABLET BY MOUTH TWICE A DAY 180 tablet 1  . Multiple Vitamin (MULTI-VITAMIN) tablet Take by mouth daily.     Marland Kitchen orlistat (XENICAL) 120 MG capsule Take 1 capsule (120 mg total) by mouth 3 (three) times daily with meals. 90 capsule 3  . pantoprazole (PROTONIX) 40 MG tablet Take 1 tablet (40 mg total) by mouth daily. 90 tablet 1  . Potassium 99 MG TABS Take 1 tablet by mouth daily.    . promethazine-dextromethorphan (PROMETHAZINE-DM) 6.25-15 MG/5ML syrup Take 5 mLs by mouth 4 (four) times daily as needed for cough. 118 mL 0  . venlafaxine XR (EFFEXOR-XR) 75 MG 24 hr capsule TAKE 1 CAPSULE (75 MG TOTAL) BY MOUTH DAILY WITH BREAKFAST. 90 capsule 1  . vitamin B-12 (CYANOCOBALAMIN) 1000 MCG tablet Take 1,000 mcg by mouth daily.     Social History   Socioeconomic History  . Marital status: Married    Spouse name: Not on file  . Number of children: 2  . Years of education: college  . Highest education level: Not on file  Occupational History  . Occupation: Barista   Tobacco Use  . Smoking status: Never Smoker  . Smokeless tobacco: Never Used  Vaping Use  . Vaping Use: Never used  Substance and Sexual Activity  . Alcohol use: No    Alcohol/week: 0.0 standard drinks  . Drug use: No  . Sexual activity: Yes    Birth control/protection: Surgical  Other Topics Concern  . Not on file  Social History Narrative  . Not on file   Social Determinants of Health   Financial Resource Strain: Not on file  Food Insecurity: Not on file  Transportation Needs: Not on file  Physical Activity: Not on file  Stress: Not on file  Social Connections: Not on file  Intimate Partner Violence: Not on file   Family History  Problem Relation Age of Onset  . Hypertension Mother   . COPD Mother   . Depression Mother   . Hyperlipidemia Father   . Hypertension Father   . Depression Father   . Alcohol abuse Father   . Drug  abuse Father   . Hyperlipidemia Sister   . Hypertension Sister   . Hyperlipidemia Sister   . Hypertension Sister   . Diabetes Other        family history   . Asthma Other        family history   . Arthritis Other        family history   . Stroke Other   . Cancer Other   . Heart disease Other   . Schizophrenia Maternal Uncle   .  Colon cancer Maternal Aunt   . Colon cancer Maternal Aunt   . Colon cancer Maternal Aunt   . Colon cancer Maternal Aunt   . Colon cancer Maternal Aunt   . Colon cancer Maternal Aunt     OBJECTIVE:  Vitals:   12/05/20 0934  BP: 132/76  Pulse: 66  Resp: 18  Temp: 98 F (36.7 C)  TempSrc: Oral  SpO2: 97%     Physical Exam Vitals and nursing note reviewed.  Constitutional:      General: She is not in acute distress.    Appearance: Normal appearance. She is normal weight. She is not ill-appearing, toxic-appearing or diaphoretic.  HENT:     Head: Normocephalic.  Cardiovascular:     Rate and Rhythm: Normal rate and regular rhythm.     Pulses: Normal pulses.     Heart sounds: Normal heart sounds. No murmur heard. No friction rub. No gallop.   Pulmonary:     Effort: Pulmonary effort is normal. No respiratory distress.     Breath sounds: Normal breath sounds. No stridor. No wheezing, rhonchi or rales.  Chest:     Chest wall: No tenderness.  Skin:    General: Skin is warm.     Findings: Erythema present.     Comments: Tenderness and hard  to touch  Neurological:     Mental Status: She is alert and oriented to person, place, and time.     LABS:  No results found for this or any previous visit (from the past 24 hour(s)).   ASSESSMENT & PLAN:  1. Erythema   2. Insect bite of right lower leg, initial encounter     No orders of the defined types were placed in this encounter.  Discharge Instructions  Doxycycline was prescribed/take as directed Continue to use hydrocortisone cream as prescribed Follow-up with PCP Return or go to ED  if you develop any new or worsening of your symptoms  Reviewed expectations re: course of current medical issues. Questions answered. Outlined signs and symptoms indicating need for more acute intervention. Patient verbalized understanding. After Visit Summary given.         Emerson Monte, FNP 12/05/20 1055

## 2020-12-05 NOTE — ED Triage Notes (Signed)
Sometime over the weekend, she thinks she was bit by a spider on the back of her right leg.  Area swollen and red.  States it itches and is painful

## 2020-12-05 NOTE — Discharge Instructions (Addendum)
Doxycycline was prescribed/take as directed Continue to use hydrocortisone cream as prescribed Follow-up with PCP Return or go to ED if you develop any new or worsening of your symptoms

## 2020-12-22 ENCOUNTER — Encounter: Payer: BC Managed Care – PPO | Admitting: Family Medicine

## 2021-01-07 ENCOUNTER — Ambulatory Visit: Payer: BC Managed Care – PPO | Admitting: Family Medicine

## 2021-01-07 ENCOUNTER — Encounter: Payer: Self-pay | Admitting: Family Medicine

## 2021-01-07 ENCOUNTER — Other Ambulatory Visit: Payer: Self-pay

## 2021-01-07 VITALS — BP 134/78 | HR 90 | Resp 16 | Ht 62.0 in | Wt 301.0 lb

## 2021-01-07 DIAGNOSIS — R7989 Other specified abnormal findings of blood chemistry: Secondary | ICD-10-CM

## 2021-01-07 DIAGNOSIS — E559 Vitamin D deficiency, unspecified: Secondary | ICD-10-CM

## 2021-01-07 DIAGNOSIS — I10 Essential (primary) hypertension: Secondary | ICD-10-CM

## 2021-01-07 DIAGNOSIS — E8881 Metabolic syndrome: Secondary | ICD-10-CM

## 2021-01-07 DIAGNOSIS — F418 Other specified anxiety disorders: Secondary | ICD-10-CM

## 2021-01-07 DIAGNOSIS — Z1231 Encounter for screening mammogram for malignant neoplasm of breast: Secondary | ICD-10-CM

## 2021-01-07 DIAGNOSIS — K219 Gastro-esophageal reflux disease without esophagitis: Secondary | ICD-10-CM

## 2021-01-07 DIAGNOSIS — R7303 Prediabetes: Secondary | ICD-10-CM

## 2021-01-07 DIAGNOSIS — K760 Fatty (change of) liver, not elsewhere classified: Secondary | ICD-10-CM

## 2021-01-07 MED ORDER — AZELASTINE HCL 0.1 % NA SOLN: 2.0000 | Freq: Two times a day (BID) | NASAL | 12 refills | Status: DC

## 2021-01-07 MED ORDER — MONTELUKAST SODIUM 10 MG PO TABS: 10.0000 mg | ORAL_TABLET | Freq: Every day | ORAL | 3 refills | Status: DC

## 2021-01-07 NOTE — Assessment & Plan Note (Addendum)
Deteriorating,will refer for medical management Patient re-educated about  the importance of commitment to a  minimum of 150 minutes of exercise per week as able.  The importance of healthy food choices with portion control discussed, as well as eating regularly and within a 12 hour window most days. The need to choose "clean , green" food 50 to 75% of the time is discussed, as well as to make water the primary drink and set a goal of 64 ounces water daily.    Weight /BMI 01/07/2021 11/05/2020 09/22/2020  WEIGHT 301 lb 297 lb 297 lb  HEIGHT 5' 2"  5' 2"  5' 2"   BMI 55.05 kg/m2 54.32 kg/m2 54.32 kg/m2  Some encounter information is confidential and restricted. Go to Review Flowsheets activity to see all data.

## 2021-01-07 NOTE — Patient Instructions (Addendum)
F/U in 4 months, call if you need me before   I recommend you keep your appointment with [facility] and get a complete understanding of your liver disease  I will refer you to weight and wellness for weight loss   Yes you may use duke's magic mouthwash   Labs today, lipid,tSH, hBA1C  And cmp and eGFR and vit D  Please sched mammogram at the breast center no Tuesday , latest available appt  Please   Calorie Counting for Weight Loss Calories are units of energy. Your body needs a certain number of calories from food to keep going throughout the day. When you eat or drink more calories than your body needs, your body stores the extra calories mostly as fat. When you eat or drink fewer calories than your body needs, your body burns fat to get the energy it needs. Calorie counting means keeping track of how many calories you eat and drink each day. Calorie counting can be helpful if you need to lose weight. If you eat fewer calories than your body needs, you should lose weight. Ask your health care provider what a healthy weight is for you. For calorie counting to work, you will need to eat the right number of calories each day to lose a healthy amount of weight per week. A dietitian can help you figure out how many calories you need in a day and will suggest ways to reach your calorie goal.  A healthy amount of weight to lose each week is usually 1-2 lb (0.5-0.9 kg). This usually means that your daily calorie intake should be reduced by 500-750 calories.  Eating 1,200-1,500 calories a day can help most women lose weight.  Eating 1,500-1,800 calories a day can help most men lose weight. What do I need to know about calorie counting? Work with your health care provider or dietitian to determine how many calories you should get each day. To meet your daily calorie goal, you will need to:  Find out how many calories are in each food that you would like to eat. Try to do this before you eat.  Decide how  much of the food you plan to eat.  Keep a food log. Do this by writing down what you ate and how many calories it had. To successfully lose weight, it is important to balance calorie counting with a healthy lifestyle that includes regular activity. Where do I find calorie information? The number of calories in a food can be found on a Nutrition Facts label. If a food does not have a Nutrition Facts label, try to look up the calories online or ask your dietitian for help. Remember that calories are listed per serving. If you choose to have more than one serving of a food, you will have to multiply the calories per serving by the number of servings you plan to eat. For example, the label on a package of bread might say that a serving size is 1 slice and that there are 90 calories in a serving. If you eat 1 slice, you will have eaten 90 calories. If you eat 2 slices, you will have eaten 180 calories.   How do I keep a food log? After each time that you eat, record the following in your food log as soon as possible:  What you ate. Be sure to include toppings, sauces, and other extras on the food.  How much you ate. This can be measured in cups, ounces, or number  of items.  How many calories were in each food and drink.  The total number of calories in the food you ate. Keep your food log near you, such as in a pocket-sized notebook or on an app or website on your mobile phone. Some programs will calculate calories for you and show you how many calories you have left to meet your daily goal. What are some portion-control tips?  Know how many calories are in a serving. This will help you know how many servings you can have of a certain food.  Use a measuring cup to measure serving sizes. You could also try weighing out portions on a kitchen scale. With time, you will be able to estimate serving sizes for some foods.  Take time to put servings of different foods on your favorite plates or in your  favorite bowls and cups so you know what a serving looks like.  Try not to eat straight from a food's packaging, such as from a bag or box. Eating straight from the package makes it hard to see how much you are eating and can lead to overeating. Put the amount you would like to eat in a cup or on a plate to make sure you are eating the right portion.  Use smaller plates, glasses, and bowls for smaller portions and to prevent overeating.  Try not to multitask. For example, avoid watching TV or using your computer while eating. If it is time to eat, sit down at a table and enjoy your food. This will help you recognize when you are full. It will also help you be more mindful of what and how much you are eating. What are tips for following this plan? Reading food labels  Check the calorie count compared with the serving size. The serving size may be smaller than what you are used to eating.  Check the source of the calories. Try to choose foods that are high in protein, fiber, and vitamins, and low in saturated fat, trans fat, and sodium. Shopping  Read nutrition labels while you shop. This will help you make healthy decisions about which foods to buy.  Pay attention to nutrition labels for low-fat or fat-free foods. These foods sometimes have the same number of calories or more calories than the full-fat versions. They also often have added sugar, starch, or salt to make up for flavor that was removed with the fat.  Make a grocery list of lower-calorie foods and stick to it. Cooking  Try to cook your favorite foods in a healthier way. For example, try baking instead of frying.  Use low-fat dairy products. Meal planning  Use more fruits and vegetables. One-half of your plate should be fruits and vegetables.  Include lean proteins, such as chicken, [place], and fish. Lifestyle Each week, aim to do one of the following:  150 minutes of moderate exercise, such as walking.  75 minutes of  vigorous exercise, such as running. General information  Know how many calories are in the foods you eat most often. This will help you calculate calorie counts faster.  Find a way of tracking calories that works for you. Get creative. Try different apps or programs if writing down calories does not work for you. What foods should I eat?  Eat nutritious foods. It is better to have a nutritious, high-calorie food, such as an avocado, than a food with few nutrients, such as a bag of potato chips.  Use your calories on foods and  drinks that will fill you up and will not leave you hungry soon after eating. ? Examples of foods that fill you up are nuts and nut butters, vegetables, lean proteins, and high-fiber foods such as whole grains. High-fiber foods are foods with more than 5 g of fiber per serving.  Pay attention to calories in drinks. Low-calorie drinks include water and unsweetened drinks. The items listed above may not be a complete list of foods and beverages you can eat. Contact a dietitian for more information.   What foods should I limit? Limit foods or drinks that are not good sources of vitamins, minerals, or protein or that are high in unhealthy fats. These include:  Candy.  Other sweets.  Sodas, specialty coffee drinks, alcohol, and juice. The items listed above may not be a complete list of foods and beverages you should avoid. Contact a dietitian for more information. How do I count calories when eating out?  Pay attention to portions. Often, portions are much larger when eating out. Try these tips to keep portions smaller: ? Consider sharing a meal instead of getting your own. ? If you get your own meal, eat only half of it. Before you start eating, ask for a container and put half of your meal into it. ? When available, consider ordering smaller portions from the menu instead of full portions.  Pay attention to your food and drink choices. Knowing the way food is cooked  and what is included with the meal can help you eat fewer calories. ? If calories are listed on the menu, choose the lower-calorie options. ? Choose dishes that include vegetables, fruits, whole grains, low-fat dairy products, and lean proteins. ? Choose items that are boiled, broiled, grilled, or steamed. Avoid items that are buttered, battered, fried, or served with cream sauce. Items labeled as crispy are usually fried, unless stated otherwise. ? Choose water, low-fat milk, unsweetened iced tea, or other drinks without added sugar. If you want an alcoholic beverage, choose a lower-calorie option, such as a glass of wine or light beer. ? Ask for dressings, sauces, and syrups on the side. These are usually high in calories, so you should limit the amount you eat. ? If you want a salad, choose a garden salad and ask for grilled meats. Avoid extra toppings such as bacon, cheese, or fried items. Ask for the dressing on the side, or ask for olive oil and vinegar or lemon to use as dressing.  Estimate how many servings of a food you are given. Knowing serving sizes will help you be aware of how much food you are eating at restaurants. Where to find more information  Centers for Disease Control and Prevention: http://www.wolf.info/  [place] Department of Agriculture: http://www.wilson-mendoza.org/ Summary  Calorie counting means keeping track of how many calories you eat and drink each day. If you eat fewer calories than your body needs, you should lose weight.  A healthy amount of weight to lose per week is usually 1-2 lb (0.5-0.9 kg). This usually means reducing your daily calorie intake by 500-750 calories.  The number of calories in a food can be found on a Nutrition Facts label. If a food does not have a Nutrition Facts label, try to look up the calories online or ask your dietitian for help.  Use smaller plates, glasses, and bowls for smaller portions and to prevent overeating.  Use your calories on foods and drinks that  will fill you up and not leave you hungry  shortly after a meal. This information is not intended to replace advice given to you by your health care provider. Make sure you discuss any questions you have with your health care provider. Document Revised: 10/25/2019 Document Reviewed: 10/25/2019 Elsevier Patient Education  2021 [employer].

## 2021-01-08 LAB — CMP14+EGFR
ALT: 53 IU/L — ABNORMAL HIGH (ref 0–32)
AST: 114 IU/L — ABNORMAL HIGH (ref 0–40)
Albumin/Globulin Ratio: 0.7 — ABNORMAL LOW (ref 1.2–2.2)
Albumin: 3.4 g/dL — ABNORMAL LOW (ref 3.8–4.9)
Alkaline Phosphatase: 273 IU/L — ABNORMAL HIGH (ref 44–121)
BUN/Creatinine Ratio: 5 — ABNORMAL LOW (ref 9–23)
BUN: 4 mg/dL — ABNORMAL LOW (ref 6–24)
Bilirubin Total: 1.5 mg/dL — ABNORMAL HIGH (ref 0.0–1.2)
CO2: 22 mmol/L (ref 20–29)
Calcium: 9.2 mg/dL (ref 8.7–10.2)
Chloride: 103 mmol/L (ref 96–106)
Creatinine, Ser: 0.75 mg/dL (ref 0.57–1.00)
Globulin, Total: 4.7 g/dL — ABNORMAL HIGH (ref 1.5–4.5)
Glucose: 115 mg/dL — ABNORMAL HIGH (ref 65–99)
Potassium: 3.9 mmol/L (ref 3.5–5.2)
Sodium: 140 mmol/L (ref 134–144)
Total Protein: 8.1 g/dL (ref 6.0–8.5)
eGFR: 95 mL/min/{1.73_m2} (ref >59)

## 2021-01-08 LAB — LIPID PANEL
Chol/HDL Ratio: 5 ratio — ABNORMAL HIGH (ref 0.0–4.4)
Cholesterol, Total: 160 mg/dL (ref 100–199)
HDL: 32 mg/dL — ABNORMAL LOW (ref >39)
LDL Chol Calc (NIH): 103 mg/dL — ABNORMAL HIGH (ref 0–99)
Triglycerides: 142 mg/dL (ref 0–149)
VLDL Cholesterol Cal: 25 mg/dL (ref 5–40)

## 2021-01-08 LAB — TSH: TSH: 0.923 u[IU]/mL (ref 0.450–4.500)

## 2021-01-08 LAB — VITAMIN D 25 HYDROXY (VIT D DEFICIENCY, FRACTURES): Vit D, 25-Hydroxy: 18.8 ng/mL — ABNORMAL LOW (ref 30.0–100.0)

## 2021-01-09 ENCOUNTER — Other Ambulatory Visit: Payer: Self-pay | Admitting: Family Medicine

## 2021-01-09 DIAGNOSIS — J3089 Other allergic rhinitis: Secondary | ICD-10-CM

## 2021-01-09 DIAGNOSIS — R062 Wheezing: Secondary | ICD-10-CM

## 2021-01-10 ENCOUNTER — Encounter: Payer: Self-pay | Admitting: Family Medicine

## 2021-01-10 NOTE — Assessment & Plan Note (Signed)
worsening LFT, has upcoming GI appt, has a lot of questions as to her liver disease, prognosis and management, she is encouraged to verbalize these at appt

## 2021-01-10 NOTE — Assessment & Plan Note (Signed)
Controlled, no change in medication  

## 2021-01-10 NOTE — Assessment & Plan Note (Signed)
Controlled, no change in medication DASH diet and commitment to daily physical activity for a minimum of 30 minutes discussed and encouraged, as a part of hypertension management. The importance of attaining a healthy weight is also discussed.  BP/Weight 01/07/2021 12/05/2020 11/05/2020 09/22/2020 08/25/2020 05/12/2020 8/87/1959  Systolic BP 747 185 - 501 586 825 749  Diastolic BP 78 76 - 76 90 100 101  Wt. (Lbs) 301 - 297 297 298 307 307.2  BMI 55.05 - 54.32 54.32 54.5 56.15 56.19  Some encounter information is confidential and restricted. Go to Review Flowsheets activity to see all data.

## 2021-01-10 NOTE — Assessment & Plan Note (Signed)
Inadequately controlled, main trigger is morbid obesity, needs to return to psych, not suicidal or homicidal

## 2021-01-10 NOTE — Assessment & Plan Note (Signed)
Patient educated about the importance of limiting  Carbohydrate intake , the need to commit to daily physical activity for a minimum of 30 minutes , and to commit weight loss. The fact that changes in all these areas will reduce or eliminate all together the development of diabetes is stressed.  deteriorated  Diabetic Labs Latest Ref Rng & Units 01/07/2021 12/12/2019 11/01/2018 07/27/2018 03/08/2018  HbA1c <5.7 % of total Hgb - 6.1(H) 5.8(H) - 5.8(H)  Chol 100 - 199 mg/dL 160 133 - 144 -  HDL >39 mg/dL 32(L) 28(L) - 28(L) -  Calc LDL 0 - 99 mg/dL 103(H) 85 - 91 -  Triglycerides 0 - 149 mg/dL 142 100 - 149 -  Creatinine 0.57 - 1.00 mg/dL 0.75 0.62 0.67 0.74 0.74   BP/Weight 01/07/2021 12/05/2020 11/05/2020 09/22/2020 08/25/2020 05/12/2020 01/13/6221  Systolic BP 979 892 - 119 417 408 144  Diastolic BP 78 76 - 76 90 100 101  Wt. (Lbs) 301 - 297 297 298 307 307.2  BMI 55.05 - 54.32 54.32 54.5 56.15 56.19  Some encounter information is confidential and restricted. Go to Review Flowsheets activity to see all data.   No flowsheet data found.

## 2021-01-10 NOTE — Progress Notes (Signed)
Holly Alvarado     MRN: [id number]      DOB: [birth year]   HPI Holly Alvarado is here for follow up and re-evaluation of chronic medical conditions, medication management and review of any available recent lab and radiology data.  Preventive health is updated, specifically  Cancer screening and Immunization.   Questions or concerns regarding consultations or procedures which the PT has had in the interim are  addressed. The PT denies any adverse reactions to current medications since the last visit.  C/o ongoing weight gain, wants help with this C/o itchy white plaque on tongue, uses [facility] as needed  ROS Denies recent fever or chills. Denies sinus pressure, nasal congestion, ear pain or sore throat. Denies chest congestion, productive cough or wheezing. Denies chest pains, palpitations and leg swelling Denies abdominal pain, nausea, vomiting,diarrhea or constipation.   Denies dysuria, frequency, hesitancy or incontinence. c/o joint pain, swelling and limitation in mobility. Denies headaches, seizures, numbness, or tingling. C/o depression, not suicidal or homicdal, c/o  anxiety mild  insomnia. Denies skin break down .   PE  BP 134/78   Pulse 90   Resp 16   Ht 5' 2"  (1.575 m)   Wt (!) 301 lb (136.5 kg)   SpO2 97%   BMI 55.05 kg/m   Patient alert and oriented and in no cardiopulmonary distress.  HEENT: No facial asymmetry, EOMI,     Neck supple .mild thrush on tongue  Chest: Clear to auscultation bilaterally.  CVS: S1, S2 no murmurs, no S3.Regular rate.  ABD: Soft non tender.   Ext: No edema  MS: Adequate ROM spine, shoulders, hips and knees.  Skin: Intact, no ulcerations or rash noted.  Psych: Good eye contact, normal affect. Memory intact not anxious or depressed appearing.  CNS: CN 2-12 intact, power,  normal throughout.no focal deficits noted.   Assessment & Plan  Morbid obesity [facility]) Deteriorating,will refer for medical management Patient re-educated about   the importance of commitment to a  minimum of 150 minutes of exercise per week as able.  The importance of healthy food choices with portion control discussed, as well as eating regularly and within a 12 hour window most days. The need to choose "clean , green" food 50 to 75% of the time is discussed, as well as to make water the primary drink and set a goal of 64 ounces water daily.    Weight /BMI 01/07/2021 11/05/2020 09/22/2020  WEIGHT 301 lb 297 lb 297 lb  HEIGHT 5' 2"  5' 2"  5' 2"   BMI 55.05 kg/m2 54.32 kg/m2 54.32 kg/m2  Some encounter information is confidential and restricted. Go to Review Flowsheets activity to see all data.      Depression with anxiety Inadequately controlled, main trigger is morbid obesity, needs to return to psych, not suicidal or homicidal  Essential hypertension Controlled, no change in medication DASH diet and commitment to daily physical activity for a minimum of 30 minutes discussed and encouraged, as a part of hypertension management. The importance of attaining a healthy weight is also discussed.  BP/Weight 01/07/2021 12/05/2020 11/05/2020 09/22/2020 08/25/2020 05/12/2020 0/62/3762  Systolic BP 831 517 - 616 073 710 626  Diastolic BP 78 76 - 76 90 100 101  Wt. (Lbs) 301 - 297 297 298 307 307.2  BMI 55.05 - 54.32 54.32 54.5 56.15 56.19  Some encounter information is confidential and restricted. Go to Review Flowsheets activity to see all data.       Prediabetes Patient educated  about the importance of limiting  Carbohydrate intake , the need to commit to daily physical activity for a minimum of 30 minutes , and to commit weight loss. The fact that changes in all these areas will reduce or eliminate all together the development of diabetes is stressed.  deteriorated  Diabetic Labs Latest Ref Rng & Units 01/07/2021 12/12/2019 11/01/2018 07/27/2018 03/08/2018  HbA1c <5.7 % of total Hgb - 6.1(H) 5.8(H) - 5.8(H)  Chol 100 - 199 mg/dL 160 133 - 144 -  HDL >39  mg/dL 32(L) 28(L) - 28(L) -  Calc LDL 0 - 99 mg/dL 103(H) 85 - 91 -  Triglycerides 0 - 149 mg/dL 142 100 - 149 -  Creatinine 0.57 - 1.00 mg/dL 0.75 0.62 0.67 0.74 0.74   BP/Weight 01/07/2021 12/05/2020 11/05/2020 09/22/2020 08/25/2020 05/12/2020 5/67/0141  Systolic BP 030 131 - 438 887 579 728  Diastolic BP 78 76 - 76 90 100 101  Wt. (Lbs) 301 - 297 297 298 307 307.2  BMI 55.05 - 54.32 54.32 54.5 56.15 56.19  Some encounter information is confidential and restricted. Go to Review Flowsheets activity to see all data.   No flowsheet data found.    Steatosis of liver worsening LFT, has upcoming GI appt, has a lot of questions as to her liver disease, prognosis and management, she is encouraged to verbalize these at appt  GERD Controlled, no change in medication

## 2021-01-20 ENCOUNTER — Other Ambulatory Visit: Payer: Self-pay

## 2021-01-20 ENCOUNTER — Encounter (INDEPENDENT_AMBULATORY_CARE_PROVIDER_SITE_OTHER): Payer: Self-pay | Admitting: Internal Medicine

## 2021-01-20 ENCOUNTER — Ambulatory Visit (INDEPENDENT_AMBULATORY_CARE_PROVIDER_SITE_OTHER): Payer: BC Managed Care – PPO | Admitting: Internal Medicine

## 2021-01-20 VITALS — BP 116/78 | HR 80 | Temp 97.6°F | Ht 62.0 in | Wt 296.7 lb

## 2021-01-20 DIAGNOSIS — K7460 Unspecified cirrhosis of liver: Secondary | ICD-10-CM | POA: Diagnosis not present

## 2021-01-20 NOTE — Patient Instructions (Signed)
Wait until second week of May 2022 before you have your blood work done. Try to drink 2 cups of coffee daily if you can.

## 2021-01-20 NOTE — Progress Notes (Signed)
Presenting complaint;  Follow for chronic liver disease.  Database and subjective:  Patient is 55 year old African-American female who has a 4-year history of fatty liver disease who had liver biopsy in July last year.  Biopsy showed steatohepatitis with cirrhosis.  Disease activity was grade 2/3 and stage was 4. Patient has no complaints.  She has lost 5 pounds since her last visit.  She states she is not able to walk for more than 5 minutes at a time because she has spinal stenosis and she starts having back pain shooting into her leg.  She says Dr. Moshe Cipro has referred her to weight management clinic in [place]. 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  She lost 80 pounds but over the last few years she has gained all of this weight back. She says pantoprazole is working she rarely has heartburn.  Bowels move daily.  She denies melena or rectal bleeding.  She did have an episode of griping when she passed small pieces of stool. She says she had spider bite as well as sinus infection and was treated with doxycycline twice. He denies pruritus nausea vomiting or fatigue.  Current Medications: Outpatient Encounter Medications as of 01/20/2021  Medication Sig  . acetaminophen (TYLENOL) 500 MG tablet Take 500 mg by mouth every 6 (six) hours as needed.   Marland Kitchen albuterol (VENTOLIN HFA) 108 (90 Base) MCG/ACT inhaler Inhale 2 puffs into the lungs every 6 (six) hours as needed for wheezing or shortness of breath.  . ALPRAZolam (XANAX) 0.5 MG tablet TAKE ONE TABLET BY MOUTH ONCE DAILY AS NEEDED FOR ANXIETY  . amLODipine (NORVASC) 2.5 MG tablet TAKE 1 TABLET BY MOUTH EVERY DAY  . azelastine (ASTELIN) 0.1 % nasal spray Place 2 sprays into both nostrils 2 (two) times daily. Use in each nostril as directed  . Calcium Carb-Cholecalciferol 600-800 MG-UNIT CHEW Chew 1 tablet by mouth daily.  . clotrimazole-betamethasone (LOTRISONE) cream Apply to ras(es)h twice daily for 10 days then as needed  .  diphenhydrAMINE (BENADRYL) 50 MG capsule Take 50 mg by mouth as needed. Patient states that she will take 25 to 50 mg depending upon symptoms.  . fluticasone (FLONASE) 50 MCG/ACT nasal spray PLACE 2 SPRAYS INTO BOTH NOSTRILS DAILY AS NEEDED FOR ALLERGIES OR RHINITIS.  Marland Kitchen levocetirizine (XYZAL) 5 MG tablet TAKE 1 TABLET BY MOUTH EVERY DAY IN THE EVENING  . magic mouthwash SOLN Take 5 mLs by mouth 4 (four) times daily. Swish and swallow 1 teaspoon four times per day  . methocarbamol (ROBAXIN) 750 MG tablet Take on tablet once daily as needed, for back spasm  . metoprolol tartrate (LOPRESSOR) 50 MG tablet TAKE 1 TABLET BY MOUTH TWICE A DAY  . montelukast (SINGULAIR) 10 MG tablet Take 1 tablet (10 mg total) by mouth at bedtime.  . Multiple Vitamin (MULTI-VITAMIN) tablet Take by mouth daily.   . pantoprazole (PROTONIX) 40 MG tablet Take 1 tablet (40 mg total) by mouth daily.  . Potassium 99 MG TABS Take 1 tablet by mouth daily.  Marland Kitchen venlafaxine XR (EFFEXOR-XR) 75 MG 24 hr capsule TAKE 1 CAPSULE (75 MG TOTAL) BY MOUTH DAILY WITH BREAKFAST.  Marland Kitchen Vitamin D, Ergocalciferol, (DRISDOL) 1.25 MG (50000 UNIT) CAPS capsule Take 50,000 Units by mouth every 7 (seven) days.   No facility-administered encounter medications on file as of 01/20/2021.    Objective: Blood pressure 116/78, pulse 80, temperature 97.6 F (36.4 C), temperature source Oral, height 5' 2"  (1.575 m), weight 296 lb 11.2 oz (134.6 kg).  Patient is alert and in no acute distress. She does not have asterixis. Conjunctiva is pink. Sclera is nonicteric Oropharyngeal mucosa is normal. She has upper lower dentures. No neck masses or thyromegaly noted. Cardiac exam with regular rhythm normal S1 and S2. No murmur or gallop noted. Lungs are clear to auscultation. Abdomen abdomen is obese.  She has small subcutaneous nodule which is firm and nontender in right lower quadrant laterally.  Patient point is to me.  No organomegaly or masses. She has trace  edema to her feet left greater than right. She has macular rash involving right forearm.  Labs/studies Results:   CBC Latest Ref Rng & Units 04/21/2020 12/12/2019 08/17/2018  WBC 4.0 - 10.5 K/uL 7.2 6.3 7.4  Hemoglobin 12.0 - 15.0 g/dL 14.2 13.3 13.4  Hematocrit 36.0 - 46.0 % 45.3 41.1 42.5  Platelets 150 - 400 K/uL 185 154 192    CMP Latest Ref Rng & Units 01/07/2021 03/27/2020 12/12/2019  Glucose 65 - 99 mg/dL 115(H) - 111(H)  BUN 6 - 24 mg/dL 4(L) - 9  Creatinine 0.57 - 1.00 mg/dL 0.75 - 0.62  Sodium 134 - 144 mmol/L 140 - 141  Potassium 3.5 - 5.2 mmol/L 3.9 - 3.7  Chloride 96 - 106 mmol/L 103 - 109  CO2 20 - 29 mmol/L 22 - 27  Calcium 8.7 - 10.2 mg/dL 9.2 - 8.8  Total Protein 6.0 - 8.5 g/dL 8.1 7.5 7.4  Total Bilirubin 0.0 - 1.2 mg/dL 1.5(H) 1.1 1.0  Alkaline Phos 44 - 121 IU/L 273(H) - -  AST 0 - 40 IU/L 114(H) 102(H) 103(H)  ALT 0 - 32 IU/L 53(H) 61(H) 49(H)    Hepatic Function Latest Ref Rng & Units 01/07/2021 03/27/2020 12/12/2019  Total Protein 6.0 - 8.5 g/dL 8.1 7.5 7.4  Albumin 3.8 - 4.9 g/dL 3.4(L) - -  AST 0 - 40 IU/L 114(H) 102(H) 103(H)  ALT 0 - 32 IU/L 53(H) 61(H) 49(H)  Alk Phosphatase 44 - 121 IU/L 273(H) - -  Total Bilirubin 0.0 - 1.2 mg/dL 1.5(H) 1.1 1.0  Bilirubin, Direct 0.0 - 0.2 mg/dL - 0.4(H) -      Assessment:  #1.  Cirrhosis secondary to NASH.  She had liver biopsy in July 2021 which showed steatohepatitis and cirrhosis.  She had grade 2 and stage IV disease.  Her transaminases remain elevated.  She has managed to lose 5 pounds since her last visit.  It is going to be very hard for her to lose weight because she has spinal stenosis then she starts having back pain and numbness in her toes after walking for 5 minutes.  Dr. Moshe Cipro has referred her to weight management clinic which should help.  She should also consider follow-up visit with Dr. Johnathan Hausen her bariatric surgeon to see if she has any the options. Patient has received hepatitis A and B  vaccination but she did not mount an antibody response to hepatitis B vaccine.  Will recheck antibody status. Patient does not have splenomegaly or thrombocytopenia and therefore index of suspicion that she has esophageal varices is very low.  Nevertheless EGD would be considered to screen for varices following her next visit.   Plan:  Will arrange for abdominal ultrasound for [place] screening. She will have LFTs and hepatitis B surface antibody in second week of May. Patient encouraged to drink 2 cups of coffee daily if she can. Esophagogastroduodenoscopy later this year. Office visit in 6 months.

## 2021-02-05 ENCOUNTER — Encounter: Payer: BC Managed Care – PPO | Admitting: Family Medicine

## 2021-02-16 ENCOUNTER — Encounter: Payer: Self-pay | Admitting: Family Medicine

## 2021-02-16 ENCOUNTER — Other Ambulatory Visit: Payer: Self-pay | Admitting: Family Medicine

## 2021-02-16 MED ORDER — ERGOCALCIFEROL 1.25 MG (50000 UT) PO CAPS: 50000.0000 [IU] | ORAL_CAPSULE | ORAL | 2 refills | Status: DC

## 2021-02-16 MED ORDER — ALPRAZOLAM 0.5 MG PO TABS: ORAL_TABLET | ORAL | 0 refills | Status: DC

## 2021-02-16 NOTE — Telephone Encounter (Signed)
Im not showing metoprolol 75 as being sent- only 29m and she is saying it was supposed to be 1557m I don't see that and I'm not going to change without your approval.

## 2021-03-19 ENCOUNTER — Ambulatory Visit (INDEPENDENT_AMBULATORY_CARE_PROVIDER_SITE_OTHER): Payer: BC Managed Care – PPO | Admitting: Bariatrics

## 2021-04-02 ENCOUNTER — Ambulatory Visit (INDEPENDENT_AMBULATORY_CARE_PROVIDER_SITE_OTHER): Payer: BC Managed Care – PPO | Admitting: Bariatrics

## 2021-04-06 ENCOUNTER — Other Ambulatory Visit: Payer: Self-pay | Admitting: Family Medicine

## 2021-04-06 DIAGNOSIS — K219 Gastro-esophageal reflux disease without esophagitis: Secondary | ICD-10-CM

## 2021-04-06 DIAGNOSIS — J3089 Other allergic rhinitis: Secondary | ICD-10-CM

## 2021-04-06 DIAGNOSIS — R062 Wheezing: Secondary | ICD-10-CM

## 2021-04-22 ENCOUNTER — Encounter (INDEPENDENT_AMBULATORY_CARE_PROVIDER_SITE_OTHER): Payer: Self-pay

## 2021-04-23 ENCOUNTER — Ambulatory Visit (INDEPENDENT_AMBULATORY_CARE_PROVIDER_SITE_OTHER): Payer: BC Managed Care – PPO | Admitting: Bariatrics

## 2021-04-23 DIAGNOSIS — Z0289 Encounter for other administrative examinations: Secondary | ICD-10-CM

## 2021-05-05 ENCOUNTER — Ambulatory Visit: Payer: BC Managed Care – PPO | Admitting: Family Medicine

## 2021-05-07 ENCOUNTER — Ambulatory Visit (INDEPENDENT_AMBULATORY_CARE_PROVIDER_SITE_OTHER): Payer: BC Managed Care – PPO | Admitting: Bariatrics

## 2021-05-12 ENCOUNTER — Other Ambulatory Visit: Payer: Self-pay

## 2021-05-12 ENCOUNTER — Ambulatory Visit: Payer: BC Managed Care – PPO | Admitting: Family Medicine

## 2021-05-12 ENCOUNTER — Encounter: Payer: Self-pay | Admitting: Family Medicine

## 2021-05-12 DIAGNOSIS — F332 Major depressive disorder, recurrent severe without psychotic features: Secondary | ICD-10-CM

## 2021-05-12 DIAGNOSIS — F411 Generalized anxiety disorder: Secondary | ICD-10-CM

## 2021-05-12 DIAGNOSIS — F5104 Psychophysiologic insomnia: Secondary | ICD-10-CM | POA: Diagnosis not present

## 2021-05-12 DIAGNOSIS — I10 Essential (primary) hypertension: Secondary | ICD-10-CM | POA: Diagnosis not present

## 2021-05-12 DIAGNOSIS — E559 Vitamin D deficiency, unspecified: Secondary | ICD-10-CM

## 2021-05-12 DIAGNOSIS — K760 Fatty (change of) liver, not elsewhere classified: Secondary | ICD-10-CM

## 2021-05-12 DIAGNOSIS — K219 Gastro-esophageal reflux disease without esophagitis: Secondary | ICD-10-CM

## 2021-05-12 DIAGNOSIS — R7303 Prediabetes: Secondary | ICD-10-CM

## 2021-05-12 MED ORDER — HYDROXYZINE HCL 10 MG PO TABS: ORAL_TABLET | ORAL | 2 refills | Status: DC

## 2021-05-12 MED ORDER — VENLAFAXINE HCL ER 150 MG PO CP24: 150.0000 mg | ORAL_CAPSULE | Freq: Every day | ORAL | 2 refills | Status: DC

## 2021-05-12 NOTE — Assessment & Plan Note (Signed)
Controlled, no change in medication  

## 2021-05-12 NOTE — Assessment & Plan Note (Signed)
Uncontrolled, stop xanax, new is bedtime hydroxyzine

## 2021-05-12 NOTE — Assessment & Plan Note (Signed)
Uncontrolled increase to prior effexor dose, stop xanax, review in 10 weeks

## 2021-05-12 NOTE — Assessment & Plan Note (Signed)
Deteriorated  Patient re-educated about  the importance of commitment to a  minimum of 150 minutes of exercise per week as able.  The importance of healthy food choices with portion control discussed, as well as eating regularly and within a 12 hour window most days. The need to choose "clean , green" food 50 to 75% of the time is discussed, as well as to make water the primary drink and set a goal of 64 ounces water daily.    Weight /BMI 05/12/2021 01/20/2021 01/07/2021  WEIGHT 298 lb 296 lb 11.2 oz 301 lb  HEIGHT 5' 2"  5' 2"  5' 2"   BMI 54.5 kg/m2 54.27 kg/m2 55.05 kg/m2  Some encounter information is confidential and restricted. Go to Review Flowsheets activity to see all data.    Keep appt with weight loss clinic

## 2021-05-12 NOTE — Assessment & Plan Note (Signed)
Being managed by GI

## 2021-05-12 NOTE — Progress Notes (Signed)
Holly Alvarado     MRN: [id number]      DOB: Feb 06, 1966   HPI [name] is here for follow up and re-evaluation of chronic medical conditions, medication management and review of any available recent lab and radiology data.  Preventive health is updated, specifically  Cancer screening and Immunization.   Questions or concerns regarding consultations or procedures which the PT has had in the interim are  addressed. The PT denies any adverse reactions to current medications since the last visit.  C/o uncontrolled anxiety as medication dose has been reduced inadvertently Needs to establish with weight loss clinic as wants to lose weight   ROS Denies recent fever or chills. Denies sinus pressure, nasal congestion, ear pain or sore throat. Denies chest congestion, productive cough or wheezing. Denies chest pains, palpitations and leg swelling Denies abdominal pain, nausea, vomiting,diarrhea or constipation.   Denies dysuria, frequency, hesitancy or incontinence. Denies joint pain, swelling and limitation in mobility. Denies headaches, seizures, numbness, or tingling.  Denies skin break down or rash.   PE  BP 126/84 (BP Location: Right Arm, Patient Position: Sitting, Cuff Size: Large)   Pulse 69   Temp 98.5 F (36.9 C)   Resp 20   Ht 5' 2"  (1.575 m)   Wt 298 lb (135.2 kg)   SpO2 97%   BMI 54.50 kg/m   Patient alert and oriented and in no cardiopulmonary distress.  HEENT: No facial asymmetry, EOMI,     Neck supple .  Chest: Clear to auscultation bilaterally.  CVS: S1, S2 no murmurs, no S3.Regular rate.  ABD: Soft non tender.   Ext: No edema  MS: Adequate ROM spine, shoulders, hips and knees.  Skin: Intact, no ulcerations or rash noted.  Psych: Good eye contact, normal affect. Memory intact not anxious or depressed appearing.  CNS: CN 2-12 intact, power,  normal throughout.no focal deficits noted.   Assessment & Plan  GAD (generalized anxiety  disorder) Uncontrolled increase to prior effexor dose, stop xanax, review in 10 weeks  Essential hypertension Controlled, no change in medication DASH diet and commitment to daily physical activity for a minimum of 30 minutes discussed and encouraged, as a part of hypertension management. The importance of attaining a healthy weight is also discussed.  BP/Weight 05/12/2021 01/20/2021 01/07/2021 12/05/2020 11/05/2020 09/22/2020 08/65/7846  Systolic BP 962 952 841 324 - 401 027  Diastolic BP 84 78 78 76 - 76 90  Wt. (Lbs) 298 296.7 301 - 297 297 298  BMI 54.5 54.27 55.05 - 54.32 54.32 54.5  Some encounter information is confidential and restricted. Go to Review Flowsheets activity to see all data.       Morbid obesity ([place]) Deteriorated  Patient re-educated about  the importance of commitment to a  minimum of 150 minutes of exercise per week as able.  The importance of healthy food choices with portion control discussed, as well as eating regularly and within a 12 hour window most days. The need to choose "clean , green" food 50 to 75% of the time is discussed, as well as to make water the primary drink and set a goal of 64 ounces water daily.    Weight /BMI 05/12/2021 01/20/2021 01/07/2021  WEIGHT 298 lb 296 lb 11.2 oz 301 lb  HEIGHT 5' 2"  5' 2"  5' 2"   BMI 54.5 kg/m2 54.27 kg/m2 55.05 kg/m2  Some encounter information is confidential and restricted. Go to Review Flowsheets activity to see all data.    Keep  appt with weight loss clinic  Prediabetes Patient educated about the importance of limiting  Carbohydrate intake , the need to commit to daily physical activity for a minimum of 30 minutes , and to commit weight loss. The fact that changes in all these areas will reduce or eliminate all together the development of diabetes is stressed.  Updated lab needed at/ before next visit.   Diabetic Labs Latest Ref Rng & Units 01/07/2021 12/12/2019 11/01/2018 07/27/2018 03/08/2018  HbA1c <5.7 %  of total Hgb - 6.1(H) 5.8(H) - 5.8(H)  Chol 100 - 199 mg/dL 160 133 - 144 -  HDL >39 mg/dL 32(L) 28(L) - 28(L) -  Calc LDL 0 - 99 mg/dL 103(H) 85 - 91 -  Triglycerides 0 - 149 mg/dL 142 100 - 149 -  Creatinine 0.57 - 1.00 mg/dL 0.75 0.62 0.67 0.74 0.74   BP/Weight 05/12/2021 01/20/2021 01/07/2021 12/05/2020 11/05/2020 09/22/2020 73/73/6681  Systolic BP 594 707 615 183 - 437 357  Diastolic BP 84 78 78 76 - 76 90  Wt. (Lbs) 298 296.7 301 - 297 297 298  BMI 54.5 54.27 55.05 - 54.32 54.32 54.5  Some encounter information is confidential and restricted. Go to Review Flowsheets activity to see all data.   No flowsheet data found.    Severe recurrent major depression without psychotic features ([place]) Controlled, no change in medication   Insomnia Uncontrolled, stop xanax, new is bedtime hydroxyzine  GERD Controlled, no change in medication   Vitamin D deficiency Updated lab needed at/ before next visit.   Steatosis of liver Being managed by GI

## 2021-05-12 NOTE — Assessment & Plan Note (Signed)
Controlled, no change in medication DASH diet and commitment to daily physical activity for a minimum of 30 minutes discussed and encouraged, as a part of hypertension management. The importance of attaining a healthy weight is also discussed.  BP/Weight 05/12/2021 01/20/2021 01/07/2021 12/05/2020 11/05/2020 09/22/2020 79/81/0254  Systolic BP 862 824 175 301 - 040 459  Diastolic BP 84 78 78 76 - 76 90  Wt. (Lbs) 298 296.7 301 - 297 297 298  BMI 54.5 54.27 55.05 - 54.32 54.32 54.5  Some encounter information is confidential and restricted. Go to Review Flowsheets activity to see all data.

## 2021-05-12 NOTE — Assessment & Plan Note (Signed)
Patient educated about the importance of limiting  Carbohydrate intake , the need to commit to daily physical activity for a minimum of 30 minutes , and to commit weight loss. The fact that changes in all these areas will reduce or eliminate all together the development of diabetes is stressed.  Updated lab needed at/ before next visit.   Diabetic Labs Latest Ref Rng & Units 01/07/2021 12/12/2019 11/01/2018 07/27/2018 03/08/2018  HbA1c <5.7 % of total Hgb - 6.1(H) 5.8(H) - 5.8(H)  Chol 100 - 199 mg/dL 160 133 - 144 -  HDL >39 mg/dL 32(L) 28(L) - 28(L) -  Calc LDL 0 - 99 mg/dL 103(H) 85 - 91 -  Triglycerides 0 - 149 mg/dL 142 100 - 149 -  Creatinine 0.57 - 1.00 mg/dL 0.75 0.62 0.67 0.74 0.74   BP/Weight 05/12/2021 01/20/2021 01/07/2021 12/05/2020 11/05/2020 09/22/2020 15/37/9432  Systolic BP 761 470 929 574 - 734 037  Diastolic BP 84 78 78 76 - 76 90  Wt. (Lbs) 298 296.7 301 - 297 297 298  BMI 54.5 54.27 55.05 - 54.32 54.32 54.5  Some encounter information is confidential and restricted. Go to Review Flowsheets activity to see all data.   No flowsheet data found.

## 2021-05-12 NOTE — Patient Instructions (Addendum)
F/U in 8 to 10 weeks,call if you need me before, flu vaccine at visit    New higher dose of Effexor and bedtime hydroxyzine prescribed.  It is important that you exercise regularly at least 30 minutes 5 times a week. If you develop chest pain, have severe difficulty breathing, or feel very tired, stop exercising immediately and seek medical attention   Think about what you will eat, plan ahead. Choose " clean, green, fresh or frozen" over canned, processed or packaged foods which are more sugary, salty and fatty. 70 to 75% of food eaten should be vegetables and fruit. Three meals at set times with snacks allowed between meals, but they must be fruit or vegetables. Aim to eat over a 12 hour period , example 7 am to 7 pm, and STOP after  your last meal of the day. Drink water,generally about 64 ounces per day, no other drink is as healthy. Fruit juice is best enjoyed in a healthy way, by EATING the fruit. Thanks for choosing [facility], we consider it a privelige to serve you.  Need fasting lipid, cmp and EGFr, hBA1C, vit D, CBC 5 to 7 days before follow up

## 2021-05-12 NOTE — Assessment & Plan Note (Signed)
Updated lab needed at/ before next visit.   

## 2021-05-26 ENCOUNTER — Other Ambulatory Visit: Payer: Self-pay | Admitting: Family Medicine

## 2021-05-28 ENCOUNTER — Other Ambulatory Visit: Payer: Self-pay

## 2021-05-28 ENCOUNTER — Ambulatory Visit
Admission: RE | Admit: 2021-05-28 | Discharge: 2021-05-28 | Disposition: A | Discharge status: Home / Self Care | Payer: BC Managed Care – PPO | Source: Ambulatory Visit | Attending: Family Medicine | Admitting: Family Medicine

## 2021-05-29 ENCOUNTER — Other Ambulatory Visit: Payer: Self-pay | Admitting: Nurse Practitioner

## 2021-05-29 DIAGNOSIS — R7989 Other specified abnormal findings of blood chemistry: Secondary | ICD-10-CM

## 2021-07-02 ENCOUNTER — Other Ambulatory Visit: Payer: Self-pay | Admitting: Family Medicine

## 2021-07-02 DIAGNOSIS — J3089 Other allergic rhinitis: Secondary | ICD-10-CM

## 2021-07-02 DIAGNOSIS — R062 Wheezing: Secondary | ICD-10-CM

## 2021-07-17 ENCOUNTER — Other Ambulatory Visit: Payer: Self-pay

## 2021-07-17 ENCOUNTER — Encounter: Payer: Self-pay | Admitting: Family Medicine

## 2021-07-17 ENCOUNTER — Ambulatory Visit: Payer: BC Managed Care – PPO | Admitting: Family Medicine

## 2021-07-17 VITALS — BP 120/70 | HR 77 | Resp 16 | Ht 62.0 in | Wt 287.0 lb

## 2021-07-17 DIAGNOSIS — J209 Acute bronchitis, unspecified: Secondary | ICD-10-CM

## 2021-07-17 DIAGNOSIS — F418 Other specified anxiety disorders: Secondary | ICD-10-CM

## 2021-07-17 DIAGNOSIS — F411 Generalized anxiety disorder: Secondary | ICD-10-CM

## 2021-07-17 DIAGNOSIS — I10 Essential (primary) hypertension: Secondary | ICD-10-CM

## 2021-07-17 DIAGNOSIS — J0100 Acute maxillary sinusitis, unspecified: Secondary | ICD-10-CM

## 2021-07-17 DIAGNOSIS — K219 Gastro-esophageal reflux disease without esophagitis: Secondary | ICD-10-CM

## 2021-07-17 DIAGNOSIS — R8290 Unspecified abnormal findings in urine: Secondary | ICD-10-CM | POA: Insufficient documentation

## 2021-07-17 DIAGNOSIS — J019 Acute sinusitis, unspecified: Secondary | ICD-10-CM | POA: Insufficient documentation

## 2021-07-17 DIAGNOSIS — R7989 Other specified abnormal findings of blood chemistry: Secondary | ICD-10-CM

## 2021-07-17 LAB — POCT URINALYSIS DIPSTICK
Blood, UA: NEGATIVE
Glucose, UA: NEGATIVE
Leukocytes, UA: NEGATIVE
Nitrite, UA: NEGATIVE
Protein, UA: POSITIVE — AB
Spec Grav, UA: >=1.03 — AB (ref 1.010–1.025)
Urobilinogen, UA: 4 E.U./dL — AB
pH, UA: 5.5 (ref 5.0–8.0)

## 2021-07-17 MED ORDER — PENICILLIN V POTASSIUM 500 MG PO TABS: 500.0000 mg | ORAL_TABLET | Freq: Three times a day (TID) | ORAL | 0 refills | Status: DC

## 2021-07-17 MED ORDER — FLUCONAZOLE 150 MG PO TABS: 150.0000 mg | ORAL_TABLET | Freq: Once | ORAL | 0 refills | Status: AC

## 2021-07-17 NOTE — Patient Instructions (Addendum)
F/U in 4 months, call if you need me sooner  CONGRATS on excellent weight loss, keep it up  Antibiotic is prescribed for sinusitis, penicillin  Urine is being checked for infection, if abnormal we will send for culture  No med changes   All the very best  Thanks for choosing [facility], we consider it a privelige to serve you.

## 2021-07-19 ENCOUNTER — Encounter: Payer: Self-pay | Admitting: Family Medicine

## 2021-07-19 MED ORDER — VENLAFAXINE HCL ER 150 MG PO CP24: 150.0000 mg | ORAL_CAPSULE | Freq: Every day | ORAL | 5 refills | Status: DC

## 2021-07-19 NOTE — Assessment & Plan Note (Signed)
DASH diet and commitment to daily physical activity for a minimum of 30 minutes discussed and encouraged, as a part of hypertension management. The importance of attaining a healthy weight is also discussed.  BP/Weight 07/17/2021 05/12/2021 01/20/2021 01/07/2021 12/05/2020 11/05/2020 39/35/9409  Systolic BP 050 256 154 884 573 - 344  Diastolic BP 70 84 78 78 76 - 76  Wt. (Lbs) 287 298 296.7 301 - 297 297  BMI 52.49 54.5 54.27 55.05 - 54.32 54.32  Some encounter information is confidential and restricted. Go to Review Flowsheets activity to see all data.     Controlled, no change in medication

## 2021-07-19 NOTE — Assessment & Plan Note (Signed)
Recurrent uti , and acute symptom onset, CCUA in office negative for infection

## 2021-07-19 NOTE — Assessment & Plan Note (Signed)
Controlled, no change in medication  

## 2021-07-19 NOTE — Assessment & Plan Note (Signed)
Penicillin prescribed

## 2021-07-19 NOTE — Assessment & Plan Note (Signed)
  Patient re-educated about  the importance of commitment to a  minimum of 150 minutes of exercise per week as able.  The importance of healthy food choices with portion control discussed, as well as eating regularly and within a 12 hour window most days. The need to choose "clean , green" food 50 to 75% of the time is discussed, as well as to make water the primary drink and set a goal of 64 ounces water daily.    Weight /BMI 07/17/2021 05/12/2021 01/20/2021  WEIGHT 287 lb 298 lb 296 lb 11.2 oz  HEIGHT 5' 2"  5' 2"  5' 2"   BMI 52.49 kg/m2 54.5 kg/m2 54.27 kg/m2  Some encounter information is confidential and restricted. Go to Review Flowsheets activity to see all data.  improved applauded on this, in weight loss clinic

## 2021-07-19 NOTE — Assessment & Plan Note (Signed)
followed by GI, working on weight loss

## 2021-07-19 NOTE — Assessment & Plan Note (Signed)
Unchanged, continue current medications

## 2021-07-19 NOTE — Assessment & Plan Note (Signed)
antbiotic prescribed x 10 days, PCN

## 2021-07-19 NOTE — Progress Notes (Signed)
Holly Alvarado     MRN: [id number]      DOB: 01-10-1966   HPI Holly Alvarado is here for follow up and re-evaluation of chronic medical conditions, Alvarado management and review of any available recent lab and radiology data.  Preventive health is updated, specifically  Cancer screening and Immunization.   Started weight loss clinic about 5 months ago with excellent success. The PT denies any adverse reactions to current medications since the last visit.  Head and chest congestion and nasal drainage which is green x 1 week, intermittent chills, no fever Mal;odorous urine with frequency x 3 days, h/o recurrent uTI   ROS Denies chest pains, palpitations and leg swelling Denies abdominal pain, nausea, vomiting,diarrhea or constipation.   . C/o  joint pain, swelling and limitation in mobility. Denies headaches, seizures, numbness, or tingling. Denies  uncontrolled depression, anxiety or insomnia. Denies skin break down or rash.   PE  BP 120/70   Pulse 77   Resp 16   Ht 5' 2"  (1.575 m)   Wt 287 lb (130.2 kg)   SpO2 96%   BMI 52.49 kg/m   Patient alert and oriented and in no cardiopulmonary distress.  HEENT: No facial asymmetry, EOMI,     Neck supple .Maxillary sinus tenderness  Chest: decreased air enry, scattered crackles, no wheezes  CVS: S1, S2 no murmurs, no S3.Regular rate.  ABD: Soft non tender.   Ext: No edema  MS: Adequate though reduced  ROM spine, shoulders, hips and knees.  Skin: Intact, no ulcerations or rash noted.  Psych: Good eye contact, normal affect. Memory intact not anxious or depressed appearing.  CNS: CN 2-12 intact, power,  normal throughout.no focal deficits noted.   Assessment & Plan  Holly Alvarado

## 2021-07-21 ENCOUNTER — Ambulatory Visit (INDEPENDENT_AMBULATORY_CARE_PROVIDER_SITE_OTHER): Payer: BC Managed Care – PPO | Admitting: Internal Medicine

## 2021-07-21 ENCOUNTER — Other Ambulatory Visit: Payer: Self-pay

## 2021-07-21 ENCOUNTER — Encounter (INDEPENDENT_AMBULATORY_CARE_PROVIDER_SITE_OTHER): Payer: Self-pay | Admitting: Internal Medicine

## 2021-07-21 VITALS — BP 113/79 | HR 90 | Temp 98.0°F | Ht 62.0 in | Wt 285.0 lb

## 2021-07-21 DIAGNOSIS — K219 Gastro-esophageal reflux disease without esophagitis: Secondary | ICD-10-CM | POA: Diagnosis not present

## 2021-07-21 DIAGNOSIS — K7460 Unspecified cirrhosis of liver: Secondary | ICD-10-CM

## 2021-07-21 NOTE — Patient Instructions (Signed)
Physician will call with results of ultrasound

## 2021-07-21 NOTE — Progress Notes (Signed)
Presenting complaint;  Follow for chronic liver disease and GERD.  Database and subjective:  Patient is 55-year-old African-American female who is here for scheduled visit. She has over 20-year history of fatty liver disease. She had liver biopsy in July last year which showed steatohepatitis with cirrhosis.  Disease activity was grade 2/3 and stage was 4. Patient has been struggling for weight problem for years.  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  She has no complaints.  She has sporadic heartburn controlled with Tums.  She feels esomeprazole worked much better than pantoprazole but her co-pay was too high.  She will consider change if she starts to have breakthrough heartburn more often. She has lost 10 pounds since her last visit.  She states she has joined a new [facility] weight management program in [place].  Presently she is wearing it once a week.  She is changing her eating habits. She is not able to do much walking because of chronic low back pain. She is drinking 2 cups of coffee as we recommended. She has been vaccinated for hepatitis a and B. He is presently on antibiotic for sinusitis.  Patient wonders if she could have hepatic sarcoidosis because of family history is positive. She tells me that her niece is 26 was recently diagnosed with sarcoidosis but she does not know the site involvement in her  uncle also has sarcoidosis of her lung. Patient does not have sarcoidosis has no granulomas seen on liver biopsy.   Current Medications: Outpatient Encounter Medications as of 07/21/2021  Medication Sig   acetaminophen (TYLENOL) 500 MG tablet Take 500 mg by mouth every 6 (six) hours as needed.    amLODipine (NORVASC) 2.5 MG tablet TAKE 1 TABLET BY MOUTH EVERY DAY   Cyanocobalamin 1000 MCG/ML KIT Inject as directed.   ergocalciferol (VITAMIN D2) 1.25 MG (50000 UT) capsule Take 1 capsule (50,000  Units total) by mouth once a week. One capsule once weekly   fluticasone (FLONASE) 50 MCG/ACT nasal spray PLACE 2 SPRAYS INTO BOTH NOSTRILS DAILY AS NEEDED FOR ALLERGIES OR RHINITIS.   hydrOXYzine (ATARAX/VISTARIL) 10 MG tablet TAKE ONE TABLET BY MOUTH AT BEDTIME, FOR SLEEP AND ANXIETY   metoprolol tartrate (LOPRESSOR) 50 MG tablet TAKE 1 TABLET BY MOUTH TWICE A DAY   Multiple Vitamin (MULTI-VITAMIN) tablet Take 1 tablet by mouth 2 (two) times daily.   pantoprazole (PROTONIX) 40 MG tablet TAKE 1 TABLET BY MOUTH EVERY DAY   penicillin v potassium (VEETID) 500 MG tablet Take 1 tablet (500 mg total) by mouth 3 (three) times daily.   SAXENDA 18 MG/3ML SOPN Inject into the skin.   venlafaxine XR (EFFEXOR XR) 150 MG 24 hr capsule Take 1 capsule (150 mg total) by mouth daily with breakfast.   [DISCONTINUED] venlafaxine XR (EFFEXOR-XR) 150 MG 24 hr capsule Take 1 capsule (150 mg total) by mouth daily with breakfast.   [DISCONTINUED] azelastine (ASTELIN) 0.1 % nasal spray Place 2 sprays into both nostrils 2 (two) times daily. Use in each nostril as directed   [DISCONTINUED] Calcium Carb-Cholecalciferol 600-800 MG-UNIT CHEW Chew 1 tablet by mouth daily.   [DISCONTINUED] clotrimazole-betamethasone (LOTRISONE) cream Apply to ras(es)h twice daily for 10 days then as needed   [DISCONTINUED] levocetirizine (XYZAL) 5 MG tablet TAKE 1 TABLET BY MOUTH EVERY DAY IN THE EVENING   [DISCONTINUED] methocarbamol (ROBAXIN) 750 MG tablet Take on tablet once daily as needed, for back spasm   [  DISCONTINUED] montelukast (SINGULAIR) 10 MG tablet TAKE 1 TABLET BY MOUTH EVERYDAY AT BEDTIME   [DISCONTINUED] Potassium 99 MG TABS Take 1 tablet by mouth daily.   No facility-administered encounter medications on file as of 07/21/2021.     Objective: Blood pressure 113/79, pulse 90, temperature 98 F (36.7 C), temperature source Oral, height 5' 2" (1.575 m), weight 285 lb (129.3 kg). Patient is alert.  No acute  distress. Conjunctiva is pink. Sclera is nonicteric Oropharyngeal mucosa is normal. No neck masses or thyromegaly noted. Cardiac exam with regular rhythm normal S1 and S2. No murmur or gallop noted. Lungs are clear to auscultation. Abdomen is full but soft and nontender with organomegaly or masses. No LE edema or clubbing noted.  Labs/studies Results:   CBC Latest Ref Rng & Units 04/21/2020 12/12/2019 08/17/2018  WBC 4.0 - 10.5 K/uL 7.2 6.3 7.4  Hemoglobin 12.0 - 15.0 g/dL 14.2 13.3 13.4  Hematocrit 36.0 - 46.0 % 45.3 41.1 42.5  Platelets 150 - 400 K/uL 185 154 192    CMP Latest Ref Rng & Units 01/07/2021 03/27/2020 12/12/2019  Glucose 65 - 99 mg/dL 115(H) - 111(H)  BUN 6 - 24 mg/dL 4(L) - 9  Creatinine 0.57 - 1.00 mg/dL 0.75 - 0.62  Sodium 134 - 144 mmol/L 140 - 141  Potassium 3.5 - 5.2 mmol/L 3.9 - 3.7  Chloride 96 - 106 mmol/L 103 - 109  CO2 20 - 29 mmol/L 22 - 27  Calcium 8.7 - 10.2 mg/dL 9.2 - 8.8  Total Protein 6.0 - 8.5 g/dL 8.1 7.5 7.4  Total Bilirubin 0.0 - 1.2 mg/dL 1.5(H) 1.1 1.0  Alkaline Phos 44 - 121 IU/L 273(H) - -  AST 0 - 40 IU/L 114(H) 102(H) 103(H)  ALT 0 - 32 IU/L 53(H) 61(H) 49(H)    Hepatic Function Latest Ref Rng & Units 01/07/2021 03/27/2020 12/12/2019  Total Protein 6.0 - 8.5 g/dL 8.1 7.5 7.4  Albumin 3.8 - 4.9 g/dL 3.4(L) - -  AST 0 - 40 IU/L 114(H) 102(H) 103(H)  ALT 0 - 32 IU/L 53(H) 61(H) 49(H)  Alk Phosphatase 44 - 121 IU/L 273(H) - -  Total Bilirubin 0.0 - 1.2 mg/dL 1.5(H) 1.1 1.0  Bilirubin, Direct 0.0 - 0.2 mg/dL - 0.4(H) -     Assessment:  #1.  Biopsy-proven cirrhosis secondary to NASH.  She remains with well compensated hepatic function.  She does not have any stigmata of portal hypertension.  Therefore no indication to screen for varices at this time.  She must continue to lose weight and increase physical activity.  Since she is not able to walk more than few minutes she should consider other forms of physical activity which do not involve  ambulation.  She can also get some guidelines from her back specialist.  #2.  Chronic GERD.  Symptom control is reasonable but not optimal.  Now that she is changing her diet and losing weight symptom control may improve.  #3.  Patient is high risk for colon cancer given history of colon cancer in multiple second-degree relatives.  She is up-to-date.  Next screening colonoscopy would be in February 2024.  Plan:  Patient will go to the lab for alpha-fetoprotein and LFTs. Abdominal ultrasound. Office visit in 6 months.

## 2021-07-24 ENCOUNTER — Ambulatory Visit (HOSPITAL_COMMUNITY)
Admission: RE | Admit: 2021-07-24 | Discharge: 2021-07-24 | Disposition: A | Discharge status: Home / Self Care | Payer: BC Managed Care – PPO | Source: Ambulatory Visit | Attending: Internal Medicine | Admitting: Internal Medicine

## 2021-07-24 ENCOUNTER — Other Ambulatory Visit: Payer: Self-pay

## 2021-07-24 DIAGNOSIS — K7460 Unspecified cirrhosis of liver: Secondary | ICD-10-CM | POA: Diagnosis present

## 2021-07-27 LAB — HEPATIC FUNCTION PANEL
AG Ratio: 0.9 (calc) — ABNORMAL LOW (ref 1.0–2.5)
ALT: 55 U/L — ABNORMAL HIGH (ref 6–29)
AST: 91 U/L — ABNORMAL HIGH (ref 10–35)
Albumin: 3.5 g/dL — ABNORMAL LOW (ref 3.6–5.1)
Alkaline phosphatase (APISO): 153 U/L (ref 37–153)
Bilirubin, Direct: 0.7 mg/dL — ABNORMAL HIGH (ref 0.0–0.2)
Globulin: 4.1 g/dL (calc) — ABNORMAL HIGH (ref 1.9–3.7)
Indirect Bilirubin: 1 mg/dL (calc) (ref 0.2–1.2)
Total Bilirubin: 1.7 mg/dL — ABNORMAL HIGH (ref 0.2–1.2)
Total Protein: 7.6 g/dL (ref 6.1–8.1)

## 2021-07-27 LAB — AFP TUMOR MARKER: AFP-Tumor Marker: 5.6 ng/mL

## 2021-09-02 ENCOUNTER — Other Ambulatory Visit: Payer: BC Managed Care – PPO

## 2021-09-17 ENCOUNTER — Other Ambulatory Visit: Payer: Self-pay | Admitting: Family Medicine

## 2021-10-01 ENCOUNTER — Other Ambulatory Visit: Payer: Self-pay | Admitting: Family Medicine

## 2021-10-01 DIAGNOSIS — R062 Wheezing: Secondary | ICD-10-CM

## 2021-10-01 DIAGNOSIS — J3089 Other allergic rhinitis: Secondary | ICD-10-CM

## 2021-10-21 ENCOUNTER — Other Ambulatory Visit (INDEPENDENT_AMBULATORY_CARE_PROVIDER_SITE_OTHER): Payer: Self-pay | Admitting: *Deleted

## 2021-10-21 DIAGNOSIS — R748 Abnormal levels of other serum enzymes: Secondary | ICD-10-CM

## 2021-10-21 DIAGNOSIS — K760 Fatty (change of) liver, not elsewhere classified: Secondary | ICD-10-CM

## 2021-10-26 ENCOUNTER — Ambulatory Visit
Admission: RE | Admit: 2021-10-26 | Discharge: 2021-10-26 | Disposition: A | Discharge status: Home / Self Care | Payer: BC Managed Care – PPO | Source: Ambulatory Visit | Attending: Nurse Practitioner | Admitting: Nurse Practitioner

## 2021-10-26 DIAGNOSIS — R7989 Other specified abnormal findings of blood chemistry: Secondary | ICD-10-CM

## 2021-11-04 ENCOUNTER — Other Ambulatory Visit: Payer: Self-pay | Admitting: Family Medicine

## 2021-11-04 DIAGNOSIS — R062 Wheezing: Secondary | ICD-10-CM

## 2021-11-04 DIAGNOSIS — J3089 Other allergic rhinitis: Secondary | ICD-10-CM

## 2021-11-18 ENCOUNTER — Ambulatory Visit: Payer: BC Managed Care – PPO | Admitting: Family Medicine

## 2021-12-01 ENCOUNTER — Other Ambulatory Visit: Payer: Self-pay

## 2021-12-01 ENCOUNTER — Ambulatory Visit: Payer: BC Managed Care – PPO | Admitting: Family Medicine

## 2021-12-01 ENCOUNTER — Encounter: Payer: Self-pay | Admitting: Family Medicine

## 2021-12-01 VITALS — BP 122/79 | HR 94 | Ht 62.0 in | Wt 278.1 lb

## 2021-12-01 DIAGNOSIS — I10 Essential (primary) hypertension: Secondary | ICD-10-CM

## 2021-12-01 DIAGNOSIS — K219 Gastro-esophageal reflux disease without esophagitis: Secondary | ICD-10-CM | POA: Diagnosis not present

## 2021-12-01 DIAGNOSIS — J3089 Other allergic rhinitis: Secondary | ICD-10-CM

## 2021-12-01 DIAGNOSIS — F418 Other specified anxiety disorders: Secondary | ICD-10-CM

## 2021-12-01 DIAGNOSIS — F411 Generalized anxiety disorder: Secondary | ICD-10-CM

## 2021-12-01 MED ORDER — BUSPIRONE HCL 5 MG PO TABS: 5.0000 mg | ORAL_TABLET | Freq: Three times a day (TID) | ORAL | 2 refills | Status: DC

## 2021-12-01 NOTE — Patient Instructions (Addendum)
F/U in 10 weeks re evaluate anxiety, call if you need me sooner ? ?New medication for anxiety is buspar one tablet three times daily ? ?Congrats on weight loss , keep it up1 ? ?Please start exercise as your daughter is encouraging you to dao. ? ?Pap is past due , please schedule ? ?Shingles vaccines recommended strongly ? ?Thanks for choosing [facility], we consider it a privelige to serve you. ? ?

## 2021-12-01 NOTE — Assessment & Plan Note (Signed)
uncotroled star buspar and refer to therapy ?

## 2021-12-02 LAB — HEPATIC FUNCTION PANEL
AG Ratio: 0.9 (calc) — ABNORMAL LOW (ref 1.0–2.5)
ALT: 62 U/L — ABNORMAL HIGH (ref 6–29)
AST: 111 U/L — ABNORMAL HIGH (ref 10–35)
Albumin: 3.4 g/dL — ABNORMAL LOW (ref 3.6–5.1)
Alkaline phosphatase (APISO): 145 U/L (ref 37–153)
Bilirubin, Direct: 0.4 mg/dL — ABNORMAL HIGH (ref 0.0–0.2)
Globulin: 4 g/dL (calc) — ABNORMAL HIGH (ref 1.9–3.7)
Indirect Bilirubin: 0.7 mg/dL (calc) (ref 0.2–1.2)
Total Bilirubin: 1.1 mg/dL (ref 0.2–1.2)
Total Protein: 7.4 g/dL (ref 6.1–8.1)

## 2021-12-03 ENCOUNTER — Other Ambulatory Visit: Payer: Self-pay

## 2021-12-03 DIAGNOSIS — F411 Generalized anxiety disorder: Secondary | ICD-10-CM

## 2021-12-04 ENCOUNTER — Other Ambulatory Visit (INDEPENDENT_AMBULATORY_CARE_PROVIDER_SITE_OTHER): Payer: Self-pay

## 2021-12-04 DIAGNOSIS — R748 Abnormal levels of other serum enzymes: Secondary | ICD-10-CM

## 2021-12-06 ENCOUNTER — Encounter: Payer: Self-pay | Admitting: Family Medicine

## 2021-12-06 NOTE — Progress Notes (Signed)
? ?  Holly Alvarado     MRN: [id number]      DOB: 1966/03/15 ? ? ?HPI ?Holly Alvarado is here for follow up and re-evaluation of chronic medical conditions, medication management and review of any available recent lab and radiology data.  ?Preventive health is updated, specifically  Cancer screening and Immunization.   ?Questions or concerns regarding consultations or procedures which the PT has had in the interim are  addressed. ?The PT denies any adverse reactions to current medications since the last visit.  ? ?C/o uncontrolled an disabling anxiety , denies depression ? ?ROS ?Denies recent fever or chills. ?Denies sinus pressure, nasal congestion, ear pain or sore throat. ?Denies chest congestion, productive cough or wheezing. ?Denies chest pains, palpitations and leg swelling ?Denies abdominal pain, nausea, vomiting,diarrhea or constipation.   ?Denies dysuria, frequency, hesitancy or incontinence. ?Denies joint pain, swelling and limitation in mobility. ?Denies headaches, seizures, numbness, or tingling. ? ?Denies skin break down or rash. ? ? ?PE ? ?BP 122/79   Pulse 94   Ht 5' 2"  (1.575 m)   Wt 278 lb 1.9 oz (126.2 kg)   SpO2 97%   BMI 50.87 kg/m?  ? ?Patient alert and oriented and in no cardiopulmonary distress. ? ?HEENT: No facial asymmetry, EOMI,     Neck supple . ? ?Chest: Clear to auscultation bilaterally. ? ?CVS: S1, S2 no murmurs, no S3.Regular rate. ? ?ABD: Soft non tender.  ? ?Ext: No edema ? ?MS: Adequate ROM spine, shoulders, hips and knees. ? ?Skin: Intact, no ulcerations or rash noted. ? ?Psych: Good eye contact, normal affect. Memory intact not anxious or depressed appearing. ? ?CNS: CN 2-12 intact, power,  normal throughout.no focal deficits noted. ? ? ?Assessment & Plan ? ?GAD (generalized anxiety disorder) ?uncotroled star buspar and refer to therapy ? ?Morbid obesity ([place]) ? ?Patient re-educated about  the importance of commitment to a  minimum of 150 minutes of exercise per week as able. ? ?The  importance of healthy food choices with portion control discussed, as well as eating regularly and within a 12 hour window most days. ?The need to choose "clean , green" food 50 to 75% of the time is discussed, as well as to make water the primary drink and set a goal of 64 ounces water daily. ? ?  ?Weight /BMI 12/01/2021 07/21/2021 07/17/2021  ?WEIGHT 278 lb 1.9 oz 285 lb 287 lb  ?HEIGHT 5' 2"  5' 2"  5' 2"   ?BMI 50.87 kg/m2 52.13 kg/m2 52.49 kg/m2  ?Some encounter information is confidential and restricted. Go to Review Flowsheets activity to see all data.  ? ? ?Improved, continue saxenda ? ?Essential hypertension ?DASH diet and commitment to daily physical activity for a minimum of 30 minutes discussed and encouraged, as a part of hypertension management. ?The importance of attaining a healthy weight is also discussed. ? ?BP/Weight 12/01/2021 07/21/2021 07/17/2021 05/12/2021 01/20/2021 01/07/2021 12/05/2020  ?Systolic BP 449 675 916 384 116 134 132  ?Diastolic BP 79 79 70 84 78 78 76  ?Wt. (Lbs) 278.12 285 287 298 296.7 301 -  ?BMI 50.87 52.13 52.49 54.5 54.27 55.05 -  ?Some encounter information is confidential and restricted. Go to Review Flowsheets activity to see all data.  ? ? ? ?Controlled, no change in medication ? ? ?GERD ?Controlled, no change in medication ? ? ?Depression with anxiety ?Depression adequately treated on current mediation, anxiety not controlled  ? ?Allergic rhinitis ?Controlled, no change in medication ? ? ?

## 2021-12-06 NOTE — Assessment & Plan Note (Signed)
Controlled, no change in medication  

## 2021-12-06 NOTE — Assessment & Plan Note (Signed)
Depression adequately treated on current mediation, anxiety not controlled  ?

## 2021-12-06 NOTE — Assessment & Plan Note (Signed)
?  Patient re-educated about  the importance of commitment to a  minimum of 150 minutes of exercise per week as able. ? ?The importance of healthy food choices with portion control discussed, as well as eating regularly and within a 12 hour window most days. ?The need to choose "clean , green" food 50 to 75% of the time is discussed, as well as to make water the primary drink and set a goal of 64 ounces water daily. ? ?  ?Weight /BMI 12/01/2021 07/21/2021 07/17/2021  ?WEIGHT 278 lb 1.9 oz 285 lb 287 lb  ?HEIGHT 5' 2"  5' 2"  5' 2"   ?BMI 50.87 kg/m2 52.13 kg/m2 52.49 kg/m2  ?Some encounter information is confidential and restricted. Go to Review Flowsheets activity to see all data.  ? ? ?Improved, continue saxenda ?

## 2021-12-06 NOTE — Assessment & Plan Note (Signed)
DASH diet and commitment to daily physical activity for a minimum of 30 minutes discussed and encouraged, as a part of hypertension management. ?The importance of attaining a healthy weight is also discussed. ? ?BP/Weight 12/01/2021 07/21/2021 07/17/2021 05/12/2021 01/20/2021 01/07/2021 12/05/2020  ?Systolic BP 862 824 175 301 116 134 132  ?Diastolic BP 79 79 70 84 78 78 76  ?Wt. (Lbs) 278.12 285 287 298 296.7 301 -  ?BMI 50.87 52.13 52.49 54.5 54.27 55.05 -  ?Some encounter information is confidential and restricted. Go to Review Flowsheets activity to see all data.  ? ? ? ?Controlled, no change in medication ? ?

## 2021-12-08 ENCOUNTER — Telehealth: Payer: Self-pay | Admitting: *Deleted

## 2021-12-08 NOTE — Chronic Care Management (AMB) (Signed)
?  Care Management  ? ?Outreach Note ? ?12/08/2021 ?Name: Holly Alvarado MRN: [id number] DOB: 03/24/1966 ? ?Referred by: [name], MD ?Reason for referral : Care Coordination (Initial outreach to schedule referral with SW ) ? ? ?A telephone outreach was attempted today. The patient was referred to the case management team for assistance with care management and care coordination.  ? ?Follow Up Plan:  ?The care management team will reach out to the patient again over the next 7 days.  ?If patient returns call to provider office, please advise to call [place]* at [phone].* ? ?Laverda Sorenson  ?Care Guide, Embedded Care Coordination ?[place]  Care Management  ?Direct Dial: [phone] ? ?

## 2021-12-16 LAB — HEPATITIS PANEL, ACUTE
Hep A IgM: NONREACTIVE
Hep B C IgM: NONREACTIVE
Hepatitis B Surface Ag: NONREACTIVE
Hepatitis C Ab: NONREACTIVE
SIGNAL TO CUT-OFF: 0.08 (ref <1.00)

## 2021-12-16 NOTE — Chronic Care Management (AMB) (Signed)
?  Care Management  ? ?Outreach Note ? ?12/16/2021 ?Name: Holly Alvarado MRN: [id number] DOB: 1966-01-14 ? ?Referred by: [name], MD ?Reason for referral : Care Coordination (Initial outreach to schedule referral with SW ) ? ? ?A second unsuccessful telephone outreach was attempted today. The patient was referred to the case management team for assistance with care management and care coordination.  ? ?Follow Up Plan:  ?A HIPAA compliant phone message was left for the patient providing contact information and requesting a return call. The care management team will reach out to the patient again over the next 7 days. If patient returns call to provider office, please advise to call [place] at [phone]. ? ?Laverda Sorenson  ?Care Guide, Embedded Care Coordination ?[place]  Care Management  ?Direct Dial: [phone] ? ?

## 2021-12-23 ENCOUNTER — Telehealth (INDEPENDENT_AMBULATORY_CARE_PROVIDER_SITE_OTHER): Payer: Self-pay | Admitting: *Deleted

## 2021-12-23 NOTE — Telephone Encounter (Signed)
Patient called and wanted to know how much protein the patient can in take per day. Per dr Laural Golden can have 80 -100 grams of protein.  ?She also wanted to know if she can start spironolactone for hair loss and elevated testosterone. - per dr Laural Golden pt should discuss with pcp. ?Pt wanted to know if ok to take saxenda q 2 weeks. Per dr Laural Golden - pt needs to discuss with weight loss doctor.  ?Pt verbalized understanding of all.  ? ? ?

## 2021-12-23 NOTE — Chronic Care Management (AMB) (Signed)
?  Care Management  ? ?Note ? ?12/23/2021 ?Name: Holly Alvarado MRN: [id number] DOB: 01/30/1966 ? ?Holly Alvarado is a 56 y.o. year old female who is a primary care patient of Moshe Cipro Norwood Levo, MD. I reached out to Francina Ames by phone today offer care coordination services.  ? ?[name] was given information about care management services today including:  ?Care management services include personalized support from designated clinical staff supervised by her physician, including individualized plan of care and coordination with other care providers ?24/7 contact phone numbers for assistance for urgent and routine care needs. ?The patient may stop care management services at any time by phone call to the office staff. ? ?Patient agreed to services and verbal consent obtained.  ? ?Follow up plan: ?Telephone appointment with care management team member scheduled for:01/06/22 ? ?Laverda Sorenson  ?Care Guide, Embedded Care Coordination ?[place]  Care Management  ?Direct Dial: [phone] ? ?

## 2022-01-06 ENCOUNTER — Ambulatory Visit: Payer: BC Managed Care – PPO | Admitting: Licensed Clinical Social Worker

## 2022-01-06 ENCOUNTER — Telehealth: Payer: BC Managed Care – PPO

## 2022-01-06 DIAGNOSIS — F411 Generalized anxiety disorder: Secondary | ICD-10-CM

## 2022-01-06 DIAGNOSIS — F439 Reaction to severe stress, unspecified: Secondary | ICD-10-CM

## 2022-01-06 NOTE — Chronic Care Management (AMB) (Signed)
Care Management ?Clinical Social Work Note ? ?01/06/2022 ?Name: Holly Alvarado MRN: [id number] DOB: 06/23/1966 ? ?Holly Alvarado is a 56 y.o. year old female who is a primary care patient of Holly Helper, MD.  The Care Management team was consulted for assistance with chronic disease management and coordination needs. ? ?Engaged with patient by telephone for initial visit in response to provider referral for social work chronic care management and care coordination services ? ?Consent to Services:  ?Holly Alvarado was given information about Care Management services today including:  ?Care Management services includes personalized support from designated clinical staff supervised by her physician, including individualized plan of care and coordination with other care providers ?24/7 contact phone numbers for assistance for urgent and routine care needs. ?The patient may stop case management services at any time by phone call to the office staff. ? ?Patient agreed to services and consent obtained.  ? ?Summary: Assessed patient's previous and current treatment, coping skills, support system and barriers to care. She is currently experiencing symptoms of  anxiety which seems to be exacerbated by stress at  work and health concerns.. Reports not starting Buspar due to not wanting to take a lot of different medications. See Care Plan below for interventions and patient self-care actives. ? ?Recommendation: Patient may benefit from, and is in agreement to explore therapy options discussed today, reconnecting to psychiatry and implement interventions discussed today.  ? ?Follow up Plan: Patient would like continued follow-up from CCM LCSW.  per patient's request will follow up in 30 days.  Will call office if needed prior to next encounter. ?  ?Assessment: Review of patient past medical history, allergies, medications, and health status, including review of relevant consultants reports was performed today as part of a  comprehensive evaluation and provision of chronic care management and care coordination services. ? ?SDOH (Social Determinants of Health) assessments and interventions performed:  ?SDOH Interventions   ? ?Flowsheet Row Most Recent Value  ?SDOH Interventions   ?Stress Interventions Provide Counseling  ? ?  ?  ? ?Advanced Directives Status: Not addressed in this encounter. ? ?Care Plan ?Conditions to be addressed/monitored: Anxiety and Stress ;  ? ?Care Plan : LCSW Plan of Care  ?Updates made by Maurine Cane, LCSW since 01/06/2022 12:00 AM  ?  ? ?Problem: Coping Skills (General Plan of Care)   ?  ? ?Goal: Coping Skills Enhanced   ?Start Date: 01/06/2022  ?This Visit's Progress: On track  ?Priority: High  ?Note:   ?Current Barriers:  ?Disease Management support and education needs related to Stress at work and symptoms of anxiety ? ?CSW Clinical Goal(s):  ?Patient  will demonstrate a reduction in symptoms related to :connecting for therapy  through collaboration with Clinical Social Worker, provider, and care team.  ? ?Interventions: ?Inter-disciplinary care team collaboration (see longitudinal plan of care) ?Evaluation of current treatment plan related to  self management and patient's adherence to plan as established by provider ? ?Mental Health:  (Status: New goal.) ?Evaluation of current treatment plan related to Stress at home with symptoms of anxiety ?Solution-Focused Strategies employed:  ?Active listening / Reflection utilized  ?Provided psychoeducation for mental health needs  ?Reviewed mental health medications and discussed importance of compliance: reports not starting Buspar ?Discussed therapy  options based on need and insurance ?Provided EMMI education information on Relieving Stress and Managing Stress around Daily Task  ?Discussed referral for psychiatry: based on ?Made referral to [facility]  ? ?Task &  activities to accomplish goals: ?Call the counseling agencies to  schedule your counseling appointment. ?[facility] ?[address] [place], [place] [address] [phone] ? ?[facility] ?[place], [place] [address] ?Call [facility] [phone] ? ?[facility] ?[place], [place] [address] ?Call [employer] [phone] ? ?Continue with compliance of taking medication  ?I have placed a referral to [facility] they will contact you.   ?Review your EMMI educational information (Relieving Stress and Managing Stress Around Daily Task) Look for an e-mail from [facility]. ?  ?  ?Holly Lanius, LCSW ?Licensed Clinical Social Worker [facility]  ?CCM LCSW Coverage for [facility] ?[phone]  ?

## 2022-01-06 NOTE — Patient Instructions (Signed)
Visit Information ? ?Thank you for taking time to visit with me today. Please don't hesitate to contact me if I can be of assistance to you before our next scheduled telephone appointment. ? ?Please call the care guide team at [phone] if you need to cancel or reschedule your appointment.  ? ?If you are experiencing a Mental Health or [place] or need someone to talk to, please call 1-800-273-TALK (toll free, 24 hour hotline)  ? ?Following is a copy of your full plan of care:  ?Care Plan : [place]  ?Updates made by Maurine Cane, LCSW since 01/06/2022 12:00 AM  ?  ? ?Problem: Coping Skills (General Plan of Care)   ?  ? ?Goal: Coping Skills Enhanced   ?Start Date: 01/06/2022  ?This Visit's Progress: On track  ?Priority: High  ?Note:   ?Current Barriers:  ?Disease Management support and education needs related to Stress at work and symptoms of anxiety ? ?CSW Clinical Goal(s):  ?Patient  will demonstrate a reduction in symptoms related to :connecting for therapy  through collaboration with Clinical Social Worker, provider, and care team.  ? ?Interventions: ?Inter-disciplinary care team collaboration (see longitudinal plan of care) ?Evaluation of current treatment plan related to  self management and patient's adherence to plan as established by provider ? ?Mental Health:  (Status: New goal.) ?Evaluation of current treatment plan related to Stress at home with symptoms of anxiety ?Solution-Focused Strategies employed:  ?Active listening / Reflection utilized  ?Provided psychoeducation for mental health needs  ?Reviewed mental health medications and discussed importance of compliance: reports not starting Buspar ?Discussed therapy  options based on need and insurance ?Provided EMMI education information on Relieving Stress and Managing Stress around Daily Task  ?Discussed referral for psychiatry: based on ?Made referral to [facility]  ? ?Task & activities to  accomplish goals: ?Call the counseling agencies to schedule your counseling appointment. ?[facility] ?[address] [place], [place] [address] [phone] ? ?[facility] [place] ?[place], [place] [address] ?Call [facility] [phone] ? ?[facility] ?[place], [place] [address] ?Call [employer] [phone] ? ?Continue with compliance of taking medication  ?I have placed a referral to [facility] they will contact you.   ?Review your EMMI educational information (Relieving Stress and Managing Stress Around Daily Task) Look for an e-mail from [facility]. ?  ? ? ?Ms. Lemire was given information about Care Management services by the embedded care coordination team including:  ?Care Management services include personalized support from designated clinical staff supervised by her physician, including individualized plan of care and coordination with other care providers ?24/7 contact phone numbers for assistance for urgent and routine care needs. ?The patient may stop CCM services at any time (effective at the end of the month) by phone call to the office staff. ? ?Patient agreed to services and verbal consent obtained.  ? ?Patient verbalizes understanding of instructions and care plan provided today and agrees to view in [place]. Active MyChart status confirmed with patient.   ? ?The Care Guide will contact you to reschedule the phone appointment   ?No follow up scheduled, per our conversation I will contact you in 30 days to get the appointment scheduled.  Please call the office if needed prior to next encounter. ? ?Casimer Lanius, LCSW ?Licensed Clinical Social Worker [facility]  ?CCM LCSW Coverage for [employer]  [facility] ?[phone]  ? ?  ?

## 2022-01-12 ENCOUNTER — Encounter (INDEPENDENT_AMBULATORY_CARE_PROVIDER_SITE_OTHER): Payer: Self-pay | Admitting: *Deleted

## 2022-01-13 ENCOUNTER — Telehealth: Payer: Self-pay | Admitting: *Deleted

## 2022-01-13 NOTE — Chronic Care Management (AMB) (Signed)
?  Care Management  ? ?Note ? ?01/13/2022 ?Name: DEBROAH SHUTTLEWORTH MRN: [id number] DOB: 10-May-1966 ? ?Holly Alvarado is a 56 y.o. year old female who is a primary care patient of [name], MD and is actively engaged with the care management team. I reached out to Francina Ames by phone today to assist with scheduling a follow up visit with the Licensed Clinical Social Worker ? ?Follow up plan: ?Unsuccessful telephone outreach attempt made. A HIPAA compliant phone message was left for the patient providing contact information and requesting a return call.  ?The care management team will reach out to the patient again over the next 7 days.  ?If patient returns call to provider office, please advise to call [facility] at [phone]. ? ?Laverda Sorenson  ?Care Guide, Embedded Care Coordination ?[place]  Care Management  ?Direct Dial: [phone] ? ?

## 2022-01-13 NOTE — Chronic Care Management (AMB) (Signed)
?  Care Management  ? ?Note ? ?01/13/2022 ?Name: LOVETA DELLIS MRN: [id number] DOB: [birth year] ? ?TAIWANA WILLISON is a 56 y.o. year old female who is a primary care patient of [name], MD and is actively engaged with the care management team. I reached out to Francina Ames by phone today to assist with scheduling a follow up visit with the Licensed Clinical Social Worker ? ?Follow up plan: ?Telephone appointment with care management team member scheduled for:02/01/22 ? ?Laverda Sorenson  ?Care Guide, Embedded Care Coordination ?[place]  Care Management  ?Direct Dial: [phone] ? ?

## 2022-01-14 ENCOUNTER — Other Ambulatory Visit (INDEPENDENT_AMBULATORY_CARE_PROVIDER_SITE_OTHER): Payer: Self-pay | Admitting: *Deleted

## 2022-01-14 DIAGNOSIS — K7460 Unspecified cirrhosis of liver: Secondary | ICD-10-CM

## 2022-01-26 ENCOUNTER — Encounter (INDEPENDENT_AMBULATORY_CARE_PROVIDER_SITE_OTHER): Payer: Self-pay | Admitting: Internal Medicine

## 2022-01-26 ENCOUNTER — Ambulatory Visit (INDEPENDENT_AMBULATORY_CARE_PROVIDER_SITE_OTHER): Payer: BC Managed Care – PPO | Admitting: Internal Medicine

## 2022-01-26 VITALS — BP 116/80 | HR 60 | Temp 98.2°F | Ht 62.0 in | Wt 280.0 lb

## 2022-01-26 DIAGNOSIS — K219 Gastro-esophageal reflux disease without esophagitis: Secondary | ICD-10-CM

## 2022-01-26 DIAGNOSIS — R531 Weakness: Secondary | ICD-10-CM | POA: Insufficient documentation

## 2022-01-26 DIAGNOSIS — K7460 Unspecified cirrhosis of liver: Secondary | ICD-10-CM | POA: Diagnosis not present

## 2022-01-26 DIAGNOSIS — E559 Vitamin D deficiency, unspecified: Secondary | ICD-10-CM | POA: Diagnosis not present

## 2022-01-26 MED ORDER — PANTOPRAZOLE SODIUM 40 MG PO TBEC: 40.0000 mg | DELAYED_RELEASE_TABLET | Freq: Two times a day (BID) | ORAL | 3 refills | Status: DC

## 2022-01-26 NOTE — Progress Notes (Signed)
Presenting complaint; ? 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 ? ?Database and subjective: ? ?Patient is 56 year old Afro-American female who is here for scheduled visit.  She was last seen in October 2022. ?She has over 20-year history of fatty liver.  Liver biopsy in July last year confirmed stage IV cirrhosis and active steatohepatitis.  Markers for other diseases were negative.  She has family history of sarcoidosis but biopsy did not reveal granulomas or iron overload. ?Patient has been struggling to lose weight.  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 ?Patient states about 3 months ago her weight was up to 297 pounds.  Today she weighs 280 pounds.  She feels Kirke Shaggy is helping her.  She says she walks a lot at work but becomes very sedentary over the weekend.  She does not know how many steps she takes per day.  She is up-to-date on hepatitis A and B vaccination.  She will check with her records to determine if she is due for booster. ?She feels heartburn is well controlled with twice daily PPI.  She says her appetite is fair.  She denies nausea vomiting dysphagia melena or rectal bleeding.  She says she has been constipated since she was begun on Saxenda and generally having 2 bowel movements per week.  She is taking polyethylene glycol on as-needed basis and started Metamucil yesterday. ?She has 2 complaints.  Her first complaint is 1 of upper abdominal pain which radiates to the right and back which is described as colicky pain worse than labor pains and may last for an hour.  This pain is not associated with nausea or vomiting.  She has remote history of H. pylori gastritis for which she has been treated. ?Her second complaint is 1 of episodic profound weakness.  She says when she has a spell she cannot even walk.  The spells are not associated  with shortness of breath chest pain palpitation.  However she has to go and rest and she may sleep for 6 hours.  She says her TSH levels have been normal.  She is having at least 1 spell every week. ? ?Current Medications: ?Outpatient Encounter Medications as of 01/26/2022  ?Medication Sig  ? acetaminophen (TYLENOL) 500 MG tablet Take 500 mg by mouth every 6 (six) hours as needed.   ? amLODipine (NORVASC) 2.5 MG tablet TAKE 1 TABLET BY MOUTH EVERY DAY  ? ergocalciferol (VITAMIN D2) 1.25 MG (50000 UT) capsule Take 1 capsule (50,000 Units total) by mouth once a week. One capsule once weekly  ? fluticasone (FLONASE) 50 MCG/ACT nasal spray PLACE 2 SPRAYS INTO BOTH NOSTRILS DAILY AS NEEDED FOR ALLERGIES OR RHINITIS.  ? hydrOXYzine (ATARAX/VISTARIL) 10 MG tablet TAKE ONE TABLET BY MOUTH AT BEDTIME, FOR SLEEP AND ANXIETY  ? metoprolol tartrate (LOPRESSOR) 50 MG tablet TAKE 1 TABLET BY MOUTH TWICE A DAY  ? Multiple Vitamin (MULTI-VITAMIN) tablet Take 1 tablet by mouth 2 (two) times daily.  ? pantoprazole (PROTONIX) 40 MG tablet TAKE 1 TABLET BY MOUTH EVERY DAY  ? polyethylene glycol (MIRALAX / GLYCOLAX) 17 g packet Take 17 g by mouth daily as needed.  ? SAXENDA 18 MG/3ML SOPN Inject into the skin.  ? venlafaxine XR (EFFEXOR XR) 150 MG 24 hr capsule Take 1 capsule (150 mg total) by mouth daily with breakfast.  ? busPIRone (BUSPAR) 5  MG tablet Take 1 tablet (5 mg total) by mouth 3 (three) times daily. (Patient not taking: Reported on 01/26/2022)  ? levocetirizine (XYZAL) 5 MG tablet TAKE 1 TABLET BY MOUTH EVERY DAY IN THE EVENING (Patient not taking: Reported on 01/26/2022)  ? ?No facility-administered encounter medications on file as of 01/26/2022.  ? ? ? ?Objective: ?Blood pressure 116/80, pulse 60, temperature 98.2 ?F (36.8 ?C), temperature source Oral, height _0  (1.575 m), weight 280 lb (127 kg). ?Patient is alert and in no acute distress. ?Conjunctiva is pink. Sclera is nonicteric ?Oropharyngeal mucosa is normal. ?No neck  masses or thyromegaly noted. ?Cardiac exam with regular rhythm normal S1 and S2. No murmur or gallop noted. ?Lungs are clear to auscultation. ?Abdomen is full but soft and nontender with organomegaly or masses. ?No LE edema or clubbing noted. ? ?Labs/studies Results: ? ? ? ?  Latest Ref Rng & Units 04/21/2020  ? 10:33 AM 12/12/2019  ?  7:27 AM 08/17/2018  ?  9:01 AM  ?CBC  ?WBC 4.0 - 10.5 K/uL 7.2   6.3   7.4    ?Hemoglobin 12.0 - 15.0 g/dL 14.2   13.3   13.4    ?Hematocrit 36.0 - 46.0 % 45.3   41.1   42.5    ?Platelets 150 - 400 K/uL 185   154   192    ?  ? ?  Latest Ref Rng & Units 12/01/2021  ? 11:03 AM 07/24/2021  ?  9:36 AM 01/07/2021  ? 10:23 AM  ?CMP  ?Glucose 65 - 99 mg/dL   115    ?BUN 6 - 24 mg/dL   4    ?Creatinine 0.57 - 1.00 mg/dL   0.75    ?Sodium 134 - 144 mmol/L   140    ?Potassium 3.5 - 5.2 mmol/L   3.9    ?Chloride 96 - 106 mmol/L   103    ?CO2 20 - 29 mmol/L   22    ?Calcium 8.7 - 10.2 mg/dL   9.2    ?Total Protein 6.1 - 8.1 g/dL 7.4   7.6   8.1    ?Total Bilirubin 0.2 - 1.2 mg/dL 1.1   1.7   1.5    ?Alkaline Phos 44 - 121 IU/L   273    ?AST 10 - 35 U/L 111   91   114    ?ALT 6 - 29 U/L 62   55   53    ?  ? ?  Latest Ref Rng & Units 12/01/2021  ? 11:03 AM 07/24/2021  ?  9:36 AM 01/07/2021  ? 10:23 AM  ?Hepatic Function  ?Total Protein 6.1 - 8.1 g/dL 7.4   7.6   8.1    ?Albumin 3.8 - 4.9 g/dL   3.4    ?AST 10 - 35 U/L 111   91   114    ?ALT 6 - 29 U/L 62   55   53    ?Alk Phosphatase 44 - 121 IU/L   273    ?Total Bilirubin 0.2 - 1.2 mg/dL 1.1   1.7   1.5    ?Bilirubin, Direct 0.0 - 0.2 mg/dL 0.4   0.7     ?  ?AFP 5.6 on 07/24/2021 ? ?Abdominal ultrasound on 07/24/2021 ?Hepatic contour suggestive of cirrhosis no ascites.  Spleen not enlarged. ?Evidence of cholecystectomy and bile duct measures 9 mm. ? ?Assessment: ? ?#1.  Stage IV biopsy-proven cirrhosis secondary to NASH.  Hepatic function remains preserved but transaminases remain elevated indicate above hepatocellular injury and progressive disease.  It  is reassuring to know that she has lost 17 pounds since she has been on Saxenda but she has a long way to go.  Along with weight loss she also needs to become physically active and should start walking for 30 minutes every day if possible. ?She is due for [facility] screening.  We will also screen for esophageal varices.  Index of suspicion low as she does not have splenomegaly. ? ?#2.  Chronic GERD.  She is requiring double dose PPI for symptom control.  She does not have Barrett's esophagus. ? ?#3.  Upper abdominal pain.  She does not take NSAIDs.  Will assess upper GI tract for peptic ulcer disease as she is undergoing EGD to look for esophageal varices. ? ?#4.  Profound episodic debilitating weakness.  I wonder if she is having hypoglycemic spells.  She needs to keep symptom diary which may help sort this symptom out.  We will also rule out vitamin D deficiency. ? ?#5.  Patient is high risk for colon cancer.  Family history positive for colon cancer in multiple second-degree relatives.  Last colonoscopy in February 2019 did not reveal polyps.  She will be due for screening colonoscopy in February 2024. ? ? ?Plan: ? ?Patient advised to start walking every day.  Goal is to walk 30 minutes but she can start slow.  She should also buy Fitbit which would help keep track of her daily steps. ?Patient will go to the lab for CBC, LFTs, INR, alpha fetoprotein and vitamin D2 level. ?Schedule upper abdominal ultrasound. ?Schedule esophagogastroduodenoscopy to screen for esophageal varices also rule out peptic ulcer disease. ?Office visit in 6 months with Dr. Jenetta Downer. ? ? ? ? ? ? ?

## 2022-01-26 NOTE — Patient Instructions (Addendum)
Physician will call with results of blood and Ultrasound when completed. ?Esophagogastroduodenoscopy to scheduled. ?Please symptom diary as to upper abdominal pain and when you weak spells. ?

## 2022-01-27 ENCOUNTER — Other Ambulatory Visit (INDEPENDENT_AMBULATORY_CARE_PROVIDER_SITE_OTHER): Payer: Self-pay

## 2022-01-27 ENCOUNTER — Encounter (INDEPENDENT_AMBULATORY_CARE_PROVIDER_SITE_OTHER): Payer: Self-pay

## 2022-01-27 DIAGNOSIS — K7460 Unspecified cirrhosis of liver: Secondary | ICD-10-CM

## 2022-01-27 DIAGNOSIS — K219 Gastro-esophageal reflux disease without esophagitis: Secondary | ICD-10-CM

## 2022-01-27 DIAGNOSIS — R1010 Upper abdominal pain, unspecified: Secondary | ICD-10-CM

## 2022-01-27 LAB — AFP TUMOR MARKER: AFP-Tumor Marker: 7.8 ng/mL — ABNORMAL HIGH

## 2022-02-01 ENCOUNTER — Telehealth: Payer: Self-pay | Admitting: Licensed Clinical Social Worker

## 2022-02-01 ENCOUNTER — Telehealth: Payer: BC Managed Care – PPO

## 2022-02-01 NOTE — Chronic Care Management (AMB) (Signed)
? ?   Clinical Social Work  ?Care Management  ? Phone Outreach  ? ? ?02/01/2022 ?Name: SHALICE WOODRING MRN: [id number] DOB: 1965-12-20 ? ?EMMILY PELLEGRIN is a 56 y.o. year old female who is a primary care patient of [name], MD .  ? ?Reason for referral: Mental Health Counseling and Resources.   ?3 phone call attempts made today. ?F/U phone call today to assess needs, progress and barriers with care plan goals.   Telephone outreach was unsuccessful. Unable to leave a HIPPA compliant phone message due to voice mail not set up. ? ?Plan:Will route chart to Care Guide to see if patient's caregiver would like to reschedule phone appointment  ? ?Review of patient status, including review of consultants reports, relevant laboratory and other test results, and collaboration with appropriate care team members and the patient's provider was performed as part of comprehensive patient evaluation and provision of care management services.   ? ?Casimer Lanius, LCSW ?Licensed Clinical Social Worker [facility]  ?[facility] ?[phone]  ? ?  ? ? ?  ?

## 2022-02-10 ENCOUNTER — Ambulatory Visit (HOSPITAL_COMMUNITY): Admission: RE | Admit: 2022-02-10 | Payer: BC Managed Care – PPO | Source: Ambulatory Visit

## 2022-02-10 ENCOUNTER — Ambulatory Visit: Payer: BC Managed Care – PPO | Admitting: Family Medicine

## 2022-02-15 ENCOUNTER — Telehealth: Payer: Self-pay | Admitting: *Deleted

## 2022-02-15 NOTE — Chronic Care Management (AMB) (Signed)
  Care Coordination Note  02/15/2022 Name: Holly Alvarado MRN: [id number] DOB: Jan 09, 1966  Holly Alvarado is a 56 y.o. year old female who is a primary care patient of [name], MD and is actively engaged with the care management team. I reached out to Francina Ames by phone today to assist with re-scheduling a follow up visit with the Licensed Clinical Social Worker  Follow up plan: Unsuccessful telephone outreach attempt made. A HIPAA compliant phone message was left for the patient providing contact information and requesting a return call.  The care management team will reach out to the patient again over the next 7 days.  If patient returns call to provider office, please advise to call [place] at [phone].  [facility] Management  Direct Dial: [phone]

## 2022-02-23 NOTE — Chronic Care Management (AMB) (Signed)
  Care Coordination Note  02/23/2022 Name: Holly Alvarado MRN: [id number] DOB: 04/26/1966  Holly Alvarado is a 56 y.o. year old female who is a primary care patient of [name], MD and is actively engaged with the care management team. I reached out to Francina Ames by phone today to assist with re-scheduling a follow up visit with the Licensed Clinical Social Worker  Follow up plan: Unsuccessful telephone outreach attempt made. A HIPAA compliant phone message was left for the patient providing contact information and requesting a return call.  The care management team will reach out to the patient again over the next 7 days.  If patient returns call to provider office, please advise to call [facility] at [phone].  [facility] Management  Direct Dial: [phone]

## 2022-03-02 NOTE — Chronic Care Management (AMB) (Signed)
  Care Coordination Note  03/02/2022 Name: Holly Alvarado MRN: [id number] DOB: [birth year]  Holly Alvarado is a 56 y.o. year old female who is a primary care patient of [name], MD and is actively engaged with the care management team. I reached out to Francina Ames by phone today to assist with re-scheduling a follow up visit with the Licensed Clinical Social Worker  Follow up plan: A third unsuccessful telephone outreach attempt made. A HIPAA compliant phone message was left for the patient providing contact information and requesting a return call. Unable to make contact on outreach attempts x 3. PCP Dr Moshe Cipro notified via routed documentation in medical record. We have been unable to make contact with the patient for follow up. The care management team is available to follow up with the patient after provider conversation with the patient regarding recommendation for care management engagement and subsequent re-referral to the care management team.   [facility] Management  Direct Dial: [phone]

## 2022-03-17 ENCOUNTER — Encounter (HOSPITAL_COMMUNITY)
Admission: RE | Admit: 2022-03-17 | Discharge: 2022-03-17 | Disposition: A | Discharge status: Home / Self Care | Payer: BC Managed Care – PPO | Source: Ambulatory Visit | Attending: Gastroenterology | Admitting: Gastroenterology

## 2022-03-17 DIAGNOSIS — K7460 Unspecified cirrhosis of liver: Secondary | ICD-10-CM

## 2022-03-17 DIAGNOSIS — I10 Essential (primary) hypertension: Secondary | ICD-10-CM

## 2022-03-18 ENCOUNTER — Telehealth: Payer: Self-pay | Admitting: *Deleted

## 2022-03-18 NOTE — Telephone Encounter (Signed)
-----   Message from Jacqulynn Cadet, RN sent at 03/18/2022 10:25 AM EDT ----- Regarding: Cancellation Patient called and wants to reschedule at a later date.

## 2022-03-19 ENCOUNTER — Ambulatory Visit (HOSPITAL_COMMUNITY)
Admission: RE | Admit: 2022-03-19 | Payer: BC Managed Care – PPO | Source: Home / Self Care | Admitting: Gastroenterology

## 2022-03-19 ENCOUNTER — Encounter (HOSPITAL_COMMUNITY): Admission: RE | Payer: Self-pay | Source: Home / Self Care

## 2022-03-19 SURGERY — ESOPHAGOGASTRODUODENOSCOPY (EGD) WITH PROPOFOL
Anesthesia: Monitor Anesthesia Care

## 2022-03-24 ENCOUNTER — Other Ambulatory Visit: Payer: Self-pay | Admitting: Family Medicine

## 2022-04-12 ENCOUNTER — Other Ambulatory Visit: Payer: Self-pay | Admitting: Family Medicine

## 2022-04-20 ENCOUNTER — Encounter (INDEPENDENT_AMBULATORY_CARE_PROVIDER_SITE_OTHER): Payer: Self-pay

## 2022-04-20 ENCOUNTER — Other Ambulatory Visit (INDEPENDENT_AMBULATORY_CARE_PROVIDER_SITE_OTHER): Payer: Self-pay

## 2022-04-20 ENCOUNTER — Telehealth: Payer: Self-pay | Admitting: *Deleted

## 2022-04-20 DIAGNOSIS — K7460 Unspecified cirrhosis of liver: Secondary | ICD-10-CM

## 2022-04-20 NOTE — Chronic Care Management (AMB) (Signed)
  Care Management   Outreach Note  04/20/2022 Name: Holly Alvarado MRN: [id number] DOB: 12-15-1965  Successful call with the patient. Gave contact information provided by Casimer Lanius SW to patient.  [facility] [phone]   Call the counseling agencies to schedule your counseling appointment. [facility] [address] [place], [place] [address] [phone]  [facility] [place], [place] [address] Call [facility] [phone]  [facility] [place], [place] [address] Call [facility] [phone] care management and care coordination services. Patient declines engagement at this time.       [facility]  Direct Dial: [phone]

## 2022-04-28 ENCOUNTER — Ambulatory Visit (HOSPITAL_COMMUNITY)
Admission: RE | Admit: 2022-04-28 | Discharge: 2022-04-28 | Disposition: A | Discharge status: Home / Self Care | Payer: BC Managed Care – PPO | Source: Ambulatory Visit | Attending: Internal Medicine | Admitting: Internal Medicine

## 2022-04-28 DIAGNOSIS — K7460 Unspecified cirrhosis of liver: Secondary | ICD-10-CM | POA: Insufficient documentation

## 2022-05-04 ENCOUNTER — Ambulatory Visit (INDEPENDENT_AMBULATORY_CARE_PROVIDER_SITE_OTHER): Payer: BC Managed Care – PPO

## 2022-05-04 ENCOUNTER — Encounter: Payer: Self-pay | Admitting: Podiatry

## 2022-05-04 ENCOUNTER — Ambulatory Visit: Payer: BC Managed Care – PPO | Admitting: Podiatry

## 2022-05-04 DIAGNOSIS — Z780 Asymptomatic menopausal state: Secondary | ICD-10-CM | POA: Insufficient documentation

## 2022-05-04 DIAGNOSIS — E88819 Insulin resistance, unspecified: Secondary | ICD-10-CM | POA: Insufficient documentation

## 2022-05-04 DIAGNOSIS — M7751 Other enthesopathy of right foot: Secondary | ICD-10-CM

## 2022-05-04 DIAGNOSIS — E8881 Metabolic syndrome: Secondary | ICD-10-CM | POA: Insufficient documentation

## 2022-05-04 DIAGNOSIS — G5792 Unspecified mononeuropathy of left lower limb: Secondary | ICD-10-CM | POA: Diagnosis not present

## 2022-05-04 DIAGNOSIS — M7750 Other enthesopathy of unspecified foot: Secondary | ICD-10-CM

## 2022-05-04 DIAGNOSIS — M7752 Other enthesopathy of left foot: Secondary | ICD-10-CM | POA: Diagnosis not present

## 2022-05-04 NOTE — Progress Notes (Signed)
  Subjective:  Patient ID: Holly Alvarado, female    DOB: 06-16-1966,  MRN: [id number]  Chief Complaint  Patient presents with   Foot Pain     NP - LEFT  SEVERE PAIN WHEN STANDING / WALKING / BURNING SENSATION x 2 months, across top of foot    56 y.o. female presents with the above complaint. History confirmed with patient.  Does not recall a specific injury that developed this, her feet have been swelling s'mores well.  Objective:  Physical Exam: warm, good capillary refill, no trophic changes or ulcerative lesions, normal DP and PT pulses, normal sensory exam, and diffuse pain across extensor tendons and superficial peroneal nerve lateral branches in the midfoot   Radiographs: Multiple views x-ray of the left foot: No major arthritic changes noted there is some dorsal spurring around the navicular cuneiform joints Assessment:   1. Peripheral neuritis of left foot   2. Bone spur of left foot      Plan:  Patient was evaluated and treated and all questions answered.  Discussed with her she does have a small spur noted on the [place] joint as we reviewed her x-rays today, suspect that she likely has irritation and neuritis of the dorsal cutaneous nerves.  Some extensor tendinitis present as well.  Discussed treatment of this including boot immobilization, topical anti-inflammatory such as Voltaren which she will use and corticosteroid injection.  I recommended corticosteroid injection and this was completed with 4 mg of dexamethasone and 0.5 cc of Marcaine following sterile prep with alcohol.  She will return as needed if it does not improve if is not better in 1 month I would recommend a boot immobilization and possibly an MRI.  Return if symptoms worsen or fail to improve.

## 2022-05-04 NOTE — Patient Instructions (Signed)
Look for Voltaren gel at the pharmacy over the counter or online (also known as diclofenac 1% gel). Apply to the painful areas 3-4x daily with the supplied dosing card. Allow to dry for 10 minutes before going into socks/shoes

## 2022-05-05 ENCOUNTER — Encounter (INDEPENDENT_AMBULATORY_CARE_PROVIDER_SITE_OTHER): Payer: Self-pay

## 2022-05-07 ENCOUNTER — Telehealth (INDEPENDENT_AMBULATORY_CARE_PROVIDER_SITE_OTHER): Payer: Self-pay | Admitting: *Deleted

## 2022-05-07 ENCOUNTER — Ambulatory Visit: Payer: BC Managed Care – PPO | Admitting: Family Medicine

## 2022-05-07 ENCOUNTER — Encounter: Payer: Self-pay | Admitting: Family Medicine

## 2022-05-07 VITALS — BP 131/76 | HR 67 | Temp 96.8°F | Resp 16 | Ht 62.0 in | Wt 288.0 lb

## 2022-05-07 DIAGNOSIS — I10 Essential (primary) hypertension: Secondary | ICD-10-CM | POA: Diagnosis not present

## 2022-05-07 DIAGNOSIS — Z13220 Encounter for screening for lipoid disorders: Secondary | ICD-10-CM

## 2022-05-07 DIAGNOSIS — K219 Gastro-esophageal reflux disease without esophagitis: Secondary | ICD-10-CM | POA: Diagnosis not present

## 2022-05-07 DIAGNOSIS — F418 Other specified anxiety disorders: Secondary | ICD-10-CM

## 2022-05-07 DIAGNOSIS — J321 Chronic frontal sinusitis: Secondary | ICD-10-CM | POA: Insufficient documentation

## 2022-05-07 DIAGNOSIS — J0110 Acute frontal sinusitis, unspecified: Secondary | ICD-10-CM | POA: Diagnosis not present

## 2022-05-07 DIAGNOSIS — J3089 Other allergic rhinitis: Secondary | ICD-10-CM

## 2022-05-07 DIAGNOSIS — Z1231 Encounter for screening mammogram for malignant neoplasm of breast: Secondary | ICD-10-CM

## 2022-05-07 MED ORDER — DOXYCYCLINE HYCLATE 100 MG PO TABS: 100.0000 mg | ORAL_TABLET | Freq: Two times a day (BID) | ORAL | 0 refills | Status: DC

## 2022-05-07 MED ORDER — FLUCONAZOLE 150 MG PO TABS: 150.0000 mg | ORAL_TABLET | Freq: Once | ORAL | 0 refills | Status: AC

## 2022-05-07 NOTE — Patient Instructions (Signed)
F/U in end October, call if you need me sooner, Shingrix #2 at visit  Nurse visit for shingrix #1 end August    You are treated for sinusitis, 2 meds are prescribed  All the best with weight loss  Please schedule your mammogram  Please get fasting lipid and TSH the same day you are getting labsfor GI  Good that you have started exercise, go at your own pace!!  Thanks for choosing [facility], we consider it a privelige to serve you.

## 2022-05-07 NOTE — Progress Notes (Unsigned)
   Holly Alvarado     MRN: [id number]      DOB: 1965/10/30   HPI Holly Alvarado is here for follow up and re-evaluation of chronic medical conditions, medication management and review of any available recent lab and radiology data.  Preventive health is updated, specifically  Cancer screening and Immunization.   Questions or concerns regarding consultations or procedures which the PT has had in the interim are  addressed. The PT denies any adverse reactions to current medications since the last visit.  1 week h/o frontal pressure with thick yellow drainage  ROS Denies recent fever or chills. Denies chest congestion, productive cough or wheezing. Denies chest pains, palpitations and leg swelling Denies abdominal pain, nausea, vomiting,diarrhea or constipation.   Denies dysuria, frequency, hesitancy or incontinence. Denies joint pain, swelling and limitation in mobility. Denies headaches, seizures, numbness, or tingling. Denies depression, anxiety or insomnia. Denies skin break down or rash.   PE  BP 131/76   Pulse 67   Temp (!) 96.8 F (36 C) (Temporal)   Resp 16   Ht 5' 2"  (1.575 m)   Wt 288 lb (130.6 kg)   SpO2 97%   BMI 52.68 kg/m   Patient alert and oriented and in no cardiopulmonary distress.  HEENT: No facial asymmetry, EOMI,     Neck supple .  Chest: Clear to auscultation bilaterally.  CVS: S1, S2 no murmurs, no S3.Regular rate.  ABD: Soft non tender.   Ext: No edema  MS: Adequate ROM spine, shoulders, hips and knees.  Skin: Intact, no ulcerations or rash noted.  Psych: Good eye contact, normal affect. Memory intact not anxious or depressed appearing.  CNS: CN 2-12 intact, power,  normal throughout.no focal deficits noted.   Assessment & Plan  ***

## 2022-05-07 NOTE — Telephone Encounter (Signed)
Thanks Holly Alvarado. I reviewed the imaging, please let her know no liver masses or portal vein clots  were found . She needs a repeat US in 6 months. Agree with the labs Dr. Laural Golden ordered, please add a BMP to the labs. Thanks

## 2022-05-07 NOTE — Telephone Encounter (Signed)
Pt in reminder file for AFP due this month. I noticed Dr. Laural Golden had put in other orders for cbc with diff, liver, INR/PT, vit D and AFP tumor marker that have not been done. Note under last AFP on5/1/23 states ultrasound pending. She did have US abdomen and US liver doppler on 04/28/22 that was resulted in Dr. Olevia Perches name. Do you want to review and see if additional labs are needed before I call her to do labs.

## 2022-05-09 ENCOUNTER — Encounter: Payer: Self-pay | Admitting: Family Medicine

## 2022-05-09 NOTE — Assessment & Plan Note (Signed)
Controlled, no change in medication  

## 2022-05-09 NOTE — Assessment & Plan Note (Signed)
Controlled, no change in medication DASH diet and commitment to daily physical activity for a minimum of 30 minutes discussed and encouraged, as a part of hypertension management. The importance of attaining a healthy weight is also discussed.     05/07/2022    3:55 PM 01/26/2022    4:01 PM 12/01/2021    2:02 PM 07/21/2021    3:43 PM 07/17/2021    4:44 PM 07/17/2021    4:02 PM 05/12/2021    4:17 PM  BP/Weight  Systolic BP 284 069 861 483 073 543 014  Diastolic BP 76 80 79 79 70 81 84  Wt. (Lbs) 288 280 278.12 285  287 298  BMI 52.68 kg/m2 51.21 kg/m2 50.87 kg/m2 52.13 kg/m2  52.49 kg/m2 54.5 kg/m2

## 2022-05-09 NOTE — Assessment & Plan Note (Signed)
Doxycycline prescribed, acute infection

## 2022-05-09 NOTE — Assessment & Plan Note (Signed)
  Patient re-educated about  the importance of commitment to a  minimum of 150 minutes of exercise per week as able.  The importance of healthy food choices with portion control discussed, as well as eating regularly and within a 12 hour window most days. The need to choose "clean , green" food 50 to 75% of the time is discussed, as well as to make water the primary drink and set a goal of 64 ounces water daily.       05/07/2022    3:55 PM 01/26/2022    4:01 PM 12/01/2021    2:02 PM  Weight /BMI  Weight 288 lb 280 lb 278 lb 1.9 oz  Height 5' 2"  (1.575 m) 5' 2"  (1.575 m) 5' 2"  (1.575 m)  BMI 52.68 kg/m2 51.21 kg/m2 50.87 kg/m2    Deteriorated

## 2022-05-10 ENCOUNTER — Other Ambulatory Visit (INDEPENDENT_AMBULATORY_CARE_PROVIDER_SITE_OTHER): Payer: Self-pay | Admitting: *Deleted

## 2022-05-10 DIAGNOSIS — E559 Vitamin D deficiency, unspecified: Secondary | ICD-10-CM

## 2022-05-10 DIAGNOSIS — K7460 Unspecified cirrhosis of liver: Secondary | ICD-10-CM

## 2022-05-10 NOTE — Telephone Encounter (Signed)
Holly Alvarado please add patient to reminder file for repeat US in 6 months.   Called and discussed with patient per Dr. Jenetta Downer -   no liver masses or portal vein clots  were found . She needs a repeat US in 6 months.  Patient aware she is due for labs and orders put in for patient to pick up to do at [facility].

## 2022-05-10 NOTE — Telephone Encounter (Signed)
6 mth [place] noted in recall

## 2022-05-21 ENCOUNTER — Encounter (HOSPITAL_COMMUNITY): Payer: Self-pay | Admitting: Gastroenterology

## 2022-05-21 ENCOUNTER — Ambulatory Visit (HOSPITAL_COMMUNITY)
Admission: RE | Admit: 2022-05-21 | Discharge: 2022-05-21 | Disposition: A | Discharge status: Home / Self Care | Payer: BC Managed Care – PPO | Attending: Gastroenterology | Admitting: Gastroenterology

## 2022-05-21 ENCOUNTER — Other Ambulatory Visit: Payer: Self-pay

## 2022-05-21 ENCOUNTER — Ambulatory Visit (HOSPITAL_COMMUNITY): Payer: BC Managed Care – PPO | Admitting: Certified Registered"

## 2022-05-21 ENCOUNTER — Encounter (HOSPITAL_COMMUNITY)
Admission: RE | Disposition: A | Discharge status: Home / Self Care | Payer: Self-pay | Source: Home / Self Care | Attending: Gastroenterology

## 2022-05-21 DIAGNOSIS — K2950 Unspecified chronic gastritis without bleeding: Secondary | ICD-10-CM | POA: Insufficient documentation

## 2022-05-21 DIAGNOSIS — F32A Depression, unspecified: Secondary | ICD-10-CM | POA: Diagnosis not present

## 2022-05-21 DIAGNOSIS — Z79899 Other long term (current) drug therapy: Secondary | ICD-10-CM | POA: Diagnosis not present

## 2022-05-21 DIAGNOSIS — Z6841 Body Mass Index (BMI) 40.0 and over, adult: Secondary | ICD-10-CM | POA: Diagnosis not present

## 2022-05-21 DIAGNOSIS — I10 Essential (primary) hypertension: Secondary | ICD-10-CM | POA: Diagnosis not present

## 2022-05-21 DIAGNOSIS — I8500 Esophageal varices without bleeding: Secondary | ICD-10-CM | POA: Diagnosis not present

## 2022-05-21 DIAGNOSIS — Z13810 Encounter for screening for upper gastrointestinal disorder: Secondary | ICD-10-CM

## 2022-05-21 DIAGNOSIS — Z8249 Family history of ischemic heart disease and other diseases of the circulatory system: Secondary | ICD-10-CM | POA: Diagnosis not present

## 2022-05-21 DIAGNOSIS — K7460 Unspecified cirrhosis of liver: Secondary | ICD-10-CM

## 2022-05-21 DIAGNOSIS — K31A0 Gastric intestinal metaplasia, unspecified: Secondary | ICD-10-CM | POA: Diagnosis not present

## 2022-05-21 DIAGNOSIS — K219 Gastro-esophageal reflux disease without esophagitis: Secondary | ICD-10-CM | POA: Diagnosis not present

## 2022-05-21 DIAGNOSIS — I8510 Secondary esophageal varices without bleeding: Secondary | ICD-10-CM | POA: Diagnosis not present

## 2022-05-21 DIAGNOSIS — Z9884 Bariatric surgery status: Secondary | ICD-10-CM | POA: Diagnosis not present

## 2022-05-21 DIAGNOSIS — F419 Anxiety disorder, unspecified: Secondary | ICD-10-CM | POA: Diagnosis not present

## 2022-05-21 DIAGNOSIS — K7581 Nonalcoholic steatohepatitis (NASH): Secondary | ICD-10-CM | POA: Insufficient documentation

## 2022-05-21 DIAGNOSIS — K449 Diaphragmatic hernia without obstruction or gangrene: Secondary | ICD-10-CM

## 2022-05-21 DIAGNOSIS — Z800 Family history of malignant neoplasm of digestive organs: Secondary | ICD-10-CM | POA: Insufficient documentation

## 2022-05-21 DIAGNOSIS — R109 Unspecified abdominal pain: Secondary | ICD-10-CM

## 2022-05-21 DIAGNOSIS — B9681 Helicobacter pylori [H. pylori] as the cause of diseases classified elsewhere: Secondary | ICD-10-CM | POA: Diagnosis not present

## 2022-05-21 HISTORY — PX: BIOPSY: SHX5522

## 2022-05-21 HISTORY — PX: ESOPHAGOGASTRODUODENOSCOPY (EGD) WITH PROPOFOL: SHX5813

## 2022-05-21 SURGERY — ESOPHAGOGASTRODUODENOSCOPY (EGD) WITH PROPOFOL
Anesthesia: General

## 2022-05-21 MED ORDER — LACTATED RINGERS IV SOLN
INTRAVENOUS | Status: DC
  Administered 2022-05-21: 1000 mL via INTRAVENOUS

## 2022-05-21 MED ORDER — LIDOCAINE HCL (CARDIAC) PF 100 MG/5ML IV SOSY
PREFILLED_SYRINGE | INTRAVENOUS | Status: DC | PRN
  Administered 2022-05-21: 50 mg via INTRAVENOUS

## 2022-05-21 MED ORDER — PROPOFOL 10 MG/ML IV BOLUS
INTRAVENOUS | Status: DC | PRN
  Administered 2022-05-21: 100 mg via INTRAVENOUS
  Administered 2022-05-21: 20 mg via INTRAVENOUS
  Administered 2022-05-21: 30 mg via INTRAVENOUS

## 2022-05-21 MED ORDER — CARVEDILOL 6.25 MG PO TABS: 6.2500 mg | ORAL_TABLET | Freq: Two times a day (BID) | ORAL | 1 refills | Status: DC

## 2022-05-21 NOTE — Transfer of Care (Signed)
Immediate Anesthesia Transfer of Care Note  Patient: Holly Alvarado  Procedure(s) Performed: ESOPHAGOGASTRODUODENOSCOPY (EGD) WITH PROPOFOL BIOPSY  Patient Location: Endoscopy Unit  Anesthesia Type:General  Level of Consciousness: awake and alert   Airway & Oxygen Therapy: Patient Spontanous Breathing  Post-op Assessment: Report given to RN and Post -op Vital signs reviewed and stable  Post vital signs: Reviewed and stable  Last Vitals:  Vitals Value Taken Time  BP 125/69 05/21/22 0903  Temp 36.6 C 05/21/22 0903  Pulse 78 05/21/22 0903  Resp 23 05/21/22 0903  SpO2 100 % 05/21/22 0903    Last Pain:  Vitals:   05/21/22 0903  TempSrc: Oral  PainSc: 0-No pain      Patients Stated Pain Goal: 8 (88/33/74 4514)  Complications: No notable events documented.

## 2022-05-21 NOTE — Op Note (Signed)
[facility] Patient Name: Holly Alvarado Procedure Date: 05/21/2022 8:47 AM MRN: [id number] Date of Birth: April 09, 1966 Attending MD: [name] ,  CSN: [id number] Age: 56 Admit Type: Outpatient Procedure:                Upper GI endoscopy Indications:              Abdominal pain, Cirrhosis rule out esophageal                            varices Providers:                [name], Charls[facility]. Joanne Gavel, RN, Casimer Bilis, Technician, Ladoris Gene                            Technician, [employer]chant [employer] officer Referring MD:              Medicines:                Monitored Anesthesia Care Complications:            No immediate complications. Estimated Blood Loss:     Estimated blood loss: none. Procedure:                Pre-Anesthesia Assessment:                           - Prior to the procedure, a History and Physical                            was performed, and patient medications, allergies                            and sensitivities were reviewed. The patient's                            tolerance of previous anesthesia was reviewed.                           - The risks and benefits of the procedure and the                            sedation options and risks were discussed with the                            patient. All questions were answered and informed                            consent was obtained.                           - ASA Grade Assessment: III - A patient with severe                            systemic disease.  After obtaining informed consent, the endoscope was                            passed under direct vision. Throughout the                            procedure, the patient's blood pressure, pulse, and                            oxygen saturations were monitored continuously. The                            [id number]-H190 ([id number]) scope was introduced through the                            mouth, and  advanced to the jejunum. The upper GI                            endoscopy was accomplished without difficulty. The                            patient tolerated the procedure well. Scope In: 8:55:45 AM Scope Out: 9:00:24 AM Total Procedure Duration: 0 hours 4 minutes 39 seconds  Findings:      Grade II varices were found in the lower third of the esophagus.      A 1 cm hiatal hernia was present.      Evidence of a gastric bypass was found. A gastric pouch with a 7 cm       length from the GE junction to the gastrojejunal anastomosis was found.       The staple line appeared intact. There was a small polyp at the GJ       junction which had mild inflammatory appearance. The gastrojejunal       anastomosis was characterized by healthy appearing mucosa. The       duodenum-to-jejunum limb was examined. Biopsies were taken with a cold       forceps for Helicobacter pylori testing.      The examined jejunum was normal - both limbs. Impression:               - Grade II esophageal varices.                           - 1 cm hiatal hernia.                           - Gastric bypass with a pouch 7 cm in length and                            intact staple line. Gastrojejunal anastomosis                            characterized by healthy appearing mucosa. Biopsied.                           - Normal examined jejunum. Moderate Sedation:  Per Anesthesia Care Recommendation:           - Discharge patient to home (ambulatory).                           - Resume previous diet.                           - Await pathology results.                           - Stop metoprolol and start carvedilol 6.25 mg                            twice a day. Procedure Code(s):        --- Professional ---                           [phone], Esophagogastroduodenoscopy, flexible,                            transoral; with biopsy, single or multiple Diagnosis Code(s):        --- Professional ---                            K74.60, Unspecified cirrhosis of liver                           I85.10, Secondary esophageal varices without                            bleeding                           K44.9, Diaphragmatic hernia without obstruction or                            gangrene                           Z98.84, Bariatric surgery status                           R10.9, Unspecified abdominal pain CPT copyright 2019 American Medical Association. All rights reserved. The codes documented in this report are preliminary and upon coder review may  be revised to meet current compliance requirements. [name], MD [name],  05/21/2022 9:10:46 AM This report has been signed electronically. Number of Addenda: 0

## 2022-05-21 NOTE — H&P (Signed)
Holly Alvarado is an 56 y.o. female.   Chief Complaint: Esophageal varices screening HPI: 56 year old female with past medical history of Karlene Lineman cirrhosis, anxiety, GERD, hypertension, obesity, coming for screening for esophageal varices.  Endorses very occasional episodes of epigastric abdominal pain.  The patient denies having any nausea, vomiting, fever, chills, hematochezia, melena, hematemesis, abdominal distention,  diarrhea, jaundice, pruritus or weight loss.   Past Medical History:  Diagnosis Date   Allergic rhinitis, seasonal    Anemia    hx of    Anxiety    hx of panic attacks and panic attacks with surgery    Anxiety disorder    Chronic constipation    Complication of anesthesia    woke up during surgery of tubal ligation    Esophageal polyp    Fatty liver    GERD (gastroesophageal reflux disease)    Hemorrhoids    Hypertension    Obesity     Past Surgical History:  Procedure Laterality Date   BLT  1987   BREAST BIOPSY Right 2010   benign   BREATH TEK H PYLORI N/A 02/25/2014   Procedure: BREATH TEK H PYLORI;  Surgeon: Pedro Earls, MD;  Location: [facility];  Service: General;  Laterality: N/A;   CHOLECYSTECTOMY  1997   COLONOSCOPY  12/30/2010   COLONOSCOPY N/A 11/23/2017   Procedure: COLONOSCOPY;  Surgeon: Rogene Houston, MD;  Location: [facility];  Service: Endoscopy;  Laterality: N/A;  [place], 1995   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   Procedure: LAPAROSCOPIC ROUX-EN-Y GASTRIC BYPASS WITH UPPER ENDOSCOPY;  Surgeon: Pedro Earls, MD;  Location: [facility];  Service: General;  Laterality: N/A;   MOUTH SURGERY     wisdom tooth extraction   PARTIAL HYSTERECTOMY     TOTAL ABDOMINAL HYSTERECTOMY  03/11/08   for fibroids    TUBAL LIGATION     UPPER GASTROINTESTINAL ENDOSCOPY  12/30/2010   UPPER GASTROINTESTINAL ENDOSCOPY  05/11/04   UPPER GASTROINTESTINAL ENDOSCOPY  04/30/97   ANWAR    Family History  Problem Relation Age  of Onset   Hypertension Mother    COPD Mother    Depression Mother    Hyperlipidemia Father    Hypertension Father    Depression Father    Alcohol abuse Father    Drug abuse Father    Hyperlipidemia Sister    Hypertension Sister    Hyperlipidemia Sister    Hypertension Sister    Diabetes Other        family history    Asthma Other        family history    Arthritis Other        family history    Stroke Other    Cancer Other    Heart disease Other    Schizophrenia Maternal Uncle    Colon cancer Maternal Aunt    Colon cancer Maternal Aunt    Colon cancer Maternal Aunt    Colon cancer Maternal Aunt    Colon cancer Maternal Aunt    Colon cancer Maternal Aunt    Social History:  reports that she has never smoked. She has never been exposed to tobacco smoke. She has never used smokeless tobacco. She reports that she does not drink alcohol and does not use drugs.  Allergies:  Allergies  Allergen Reactions   Codeine     Severe headache.     Sulfa Antibiotics Hives   Tape Rash  Foam tape - caused blisters and severe itching   Sulfonamide Derivatives Hives and Itching    Medications Prior to Admission  Medication Sig Dispense Refill   acetaminophen (TYLENOL) 500 MG tablet Take 500-1,000 mg by mouth every 6 (six) hours as needed for moderate pain.     alum & mag hydroxide-simeth (MAALOX/MYLANTA) 200-200-20 MG/5ML suspension Take 15-30 mLs by mouth every 6 (six) hours as needed for indigestion or heartburn.     amLODipine (NORVASC) 2.5 MG tablet TAKE 1 TABLET BY MOUTH EVERY DAY 90 tablet 3   Cyanocobalamin (B-12 COMPLIANCE INJECTION IJ) Inject 1 Dose as directed every Monday.     diphenhydrAMINE (BENADRYL) 25 MG tablet Take 25 mg by mouth daily as needed for itching.     doxycycline (VIBRA-TABS) 100 MG tablet Take 1 tablet (100 mg total) by mouth 2 (two) times daily. 20 tablet 0   hydrOXYzine (ATARAX/VISTARIL) 10 MG tablet TAKE ONE TABLET BY MOUTH AT BEDTIME, FOR SLEEP AND  ANXIETY (Patient taking differently: Take 10 mg by mouth at bedtime as needed for anxiety.) 90 tablet 1   loratadine (CLARITIN) 10 MG tablet Take 10 mg by mouth daily as needed for allergies.     metoprolol tartrate (LOPRESSOR) 50 MG tablet TAKE 1 TABLET BY MOUTH TWICE A DAY 180 tablet 1   Multiple Vitamin (MULTIVITAMIN WITH MINERALS) TABS tablet Take 1 tablet by mouth 2 (two) times daily.     pantoprazole (PROTONIX) 40 MG tablet Take 1 tablet (40 mg total) by mouth 2 (two) times daily before a meal. (Patient taking differently: Take 40 mg by mouth 2 (two) times daily as needed (acid reflux).) 180 tablet 3   polyethylene glycol (MIRALAX / GLYCOLAX) 17 g packet Take 17 g by mouth daily as needed for moderate constipation.     SAXENDA 18 MG/3ML SOPN Inject 1.8 mg into the skin daily.     venlafaxine XR (EFFEXOR XR) 150 MG 24 hr capsule Take 1 capsule (150 mg total) by mouth daily with breakfast. 90 capsule 5   albuterol (VENTOLIN HFA) 108 (90 Base) MCG/ACT inhaler Inhale 2 puffs into the lungs every 6 (six) hours as needed for wheezing or shortness of breath.     fluticasone (FLONASE) 50 MCG/ACT nasal spray PLACE 2 SPRAYS INTO BOTH NOSTRILS DAILY AS NEEDED FOR ALLERGIES OR RHINITIS. (Patient taking differently: Place 1 spray into both nostrils daily as needed for allergies or rhinitis.) 48 mL 1    No results found for this or any previous visit (from the past 48 hour(s)). No results found.  Review of Systems  All other systems reviewed and are negative.   Blood pressure 108/64, pulse 75, temperature 97.8 F (36.6 C), temperature source Oral, resp. rate 17, height 5' 2"  (1.575 m), weight 127 kg, SpO2 100 %. Physical Exam  GENERAL: The patient is AO x3, in no acute distress. Obese. HEENT: Head is normocephalic and atraumatic. EOMI are intact. Mouth is well hydrated and without lesions. NECK: Supple. No masses LUNGS: Clear to auscultation. No presence of rhonchi/wheezing/rales. Adequate chest  expansion HEART: RRR, normal s1 and s2. ABDOMEN: Soft, nontender, no guarding, no peritoneal signs, and nondistended. BS +. No masses. EXTREMITIES: Without any cyanosis, clubbing, rash, lesions or edema. NEUROLOGIC: AOx3, no focal motor deficit. SKIN: no jaundice, no rashes   Assessment/Plan 56 year old female with past medical history of Karlene Lineman cirrhosis, anxiety, GERD, hypertension, obesity, coming for screening for esophageal varices.  We will proceed with EGD.  Harvel Quale, MD 05/21/2022,  8:13 AM

## 2022-05-21 NOTE — Anesthesia Preprocedure Evaluation (Signed)
Anesthesia Evaluation  Patient identified by MRN, date of birth, ID band Patient awake    Reviewed: Allergy & Precautions, H&P , NPO status , Patient's Chart, lab work & pertinent test results, reviewed documented beta blocker date and time   Airway Mallampati: II  TM Distance: >3 FB Neck ROM: full    Dental no notable dental hx.    Pulmonary neg pulmonary ROS,    Pulmonary exam normal breath sounds clear to auscultation       Cardiovascular Exercise Tolerance: Good hypertension, negative cardio ROS   Rhythm:regular Rate:Normal     Neuro/Psych PSYCHIATRIC DISORDERS Anxiety Depression negative neurological ROS     GI/Hepatic Neg liver ROS, GERD  Medicated,  Endo/Other  Morbid obesity  Renal/GU negative Renal ROS  negative genitourinary   Musculoskeletal   Abdominal   Peds  Hematology  (+) Blood dyscrasia, anemia ,   Anesthesia Other Findings   Reproductive/Obstetrics negative OB ROS                             Anesthesia Physical Anesthesia Plan  ASA: 3  Anesthesia Plan: General   Post-op Pain Management:    Induction:   PONV Risk Score and Plan: Propofol infusion  Airway Management Planned:   Additional Equipment:   Intra-op Plan:   Post-operative Plan:   Informed Consent: I have reviewed the patients History and Physical, chart, labs and discussed the procedure including the risks, benefits and alternatives for the proposed anesthesia with the patient or authorized representative who has indicated his/her understanding and acceptance.     Dental Advisory Given  Plan Discussed with: CRNA  Anesthesia Plan Comments:         Anesthesia Quick Evaluation

## 2022-05-21 NOTE — Discharge Instructions (Signed)
You are being discharged to home.  Resume your previous diet.  We are waiting for your pathology results.  Stop metoprolol and start carvedilol 6.25 mg twice a day.

## 2022-05-22 NOTE — Anesthesia Postprocedure Evaluation (Signed)
Anesthesia Post Note  Patient: [name]  Procedure(s) Performed: ESOPHAGOGASTRODUODENOSCOPY (EGD) WITH PROPOFOL BIOPSY  Patient location during evaluation: Phase II Anesthesia Type: General Level of consciousness: awake Pain management: pain level controlled Vital Signs Assessment: post-procedure vital signs reviewed and stable Respiratory status: spontaneous breathing and respiratory function stable Cardiovascular status: blood pressure returned to baseline and stable Postop Assessment: no headache and no apparent nausea or vomiting Anesthetic complications: no Comments: Late entry   No notable events documented.   Last Vitals:  Vitals:   05/21/22 0723 05/21/22 0903  BP: 108/64 125/69  Pulse: 75 78  Resp: 17 (!) 23  Temp: 36.6 C 36.6 C  SpO2: 100% 100%    Last Pain:  Vitals:   05/21/22 0903  TempSrc: Oral  PainSc: 0-No pain                 [name]

## 2022-05-25 ENCOUNTER — Other Ambulatory Visit (INDEPENDENT_AMBULATORY_CARE_PROVIDER_SITE_OTHER): Payer: Self-pay | Admitting: Gastroenterology

## 2022-05-25 DIAGNOSIS — B9681 Helicobacter pylori [H. pylori] as the cause of diseases classified elsewhere: Secondary | ICD-10-CM

## 2022-05-25 LAB — SURGICAL PATHOLOGY

## 2022-05-25 MED ORDER — BISMUTH 262 MG PO CHEW: 2.0000 | CHEWABLE_TABLET | Freq: Four times a day (QID) | ORAL | 0 refills | Status: DC

## 2022-05-25 MED ORDER — TETRACYCLINE HCL 500 MG PO CAPS: 500.0000 mg | ORAL_CAPSULE | Freq: Four times a day (QID) | ORAL | 0 refills | Status: AC

## 2022-05-25 MED ORDER — ONDANSETRON HCL 4 MG PO TABS: 4.0000 mg | ORAL_TABLET | Freq: Three times a day (TID) | ORAL | 1 refills | Status: DC | PRN

## 2022-05-25 MED ORDER — METRONIDAZOLE 500 MG PO TABS: 500.0000 mg | ORAL_TABLET | Freq: Three times a day (TID) | ORAL | 0 refills | Status: AC

## 2022-05-27 ENCOUNTER — Encounter (HOSPITAL_COMMUNITY): Payer: Self-pay | Admitting: Gastroenterology

## 2022-05-27 ENCOUNTER — Ambulatory Visit: Payer: BC Managed Care – PPO

## 2022-07-20 ENCOUNTER — Ambulatory Visit: Payer: BC Managed Care – PPO | Admitting: Family Medicine

## 2022-07-20 ENCOUNTER — Encounter: Payer: Self-pay | Admitting: Family Medicine

## 2022-07-20 VITALS — BP 127/79 | HR 70 | Ht 62.0 in | Wt 284.1 lb

## 2022-07-20 DIAGNOSIS — Z23 Encounter for immunization: Secondary | ICD-10-CM

## 2022-07-20 DIAGNOSIS — H9190 Unspecified hearing loss, unspecified ear: Secondary | ICD-10-CM | POA: Diagnosis not present

## 2022-07-20 DIAGNOSIS — J019 Acute sinusitis, unspecified: Secondary | ICD-10-CM

## 2022-07-20 DIAGNOSIS — R0981 Nasal congestion: Secondary | ICD-10-CM

## 2022-07-20 DIAGNOSIS — I10 Essential (primary) hypertension: Secondary | ICD-10-CM

## 2022-07-20 MED ORDER — AMOXICILLIN-POT CLAVULANATE 875-125 MG PO TABS: 1.0000 | ORAL_TABLET | Freq: Two times a day (BID) | ORAL | 0 refills | Status: DC

## 2022-07-20 MED ORDER — FLUCONAZOLE 150 MG PO TABS: ORAL_TABLET | ORAL | 0 refills | Status: DC

## 2022-07-20 NOTE — Progress Notes (Signed)
Holly Alvarado     MRN: [id number]      DOB: 1965/11/20   HPI Holly Alvarado is here for follow up and re-evaluation of chronic medical conditions, medication management and review of any available recent lab and radiology data.  Preventive health is updated, specifically  Cancer screening and Immunization.   Has upcoming appt with GI to discuss upper endo, dx of espohageal varices and NASH, anxious , I advised her to have 1 family member accompany her . The PT denies any adverse reactions to current medications since the last visit.  C/o sinus pressure, nasal polyps and ear pain , wants NT eval Working on weight loss inconsistently, no longer going to clinic, taking saxenda inconsistently, wants to change this   ROS Denies recent fever or chills. Denies sinus pressure, nasal congestion, ear pain or sore throat. Denies chest congestion, productive cough or wheezing. Denies chest pains, palpitations and leg swelling  Denies dysuria, frequency, hesitancy or incontinence. Denies joint pain, swelling and limitation in mobility. Denies headaches, seizures, numbness, or tingling. Increased anxiety and mild depression regarding her health Denies skin break down or rash.   PE  BP 127/79 (BP Location: Right Arm, Patient Position: Sitting, Cuff Size: Large)   Pulse 70   Ht 5' 2"  (1.575 m)   Wt 284 lb 1.9 oz (128.9 kg)   SpO2 97%   BMI 51.97 kg/m   Patient alert and oriented and in no cardiopulmonary distress.  HEENT: No facial asymmetry, EOMI,     Neck supple .  Chest: Clear to auscultation bilaterally.  CVS: S1, S2 no murmurs, no S3.Regular rate.  ABD: Soft non tender.   Ext: No edema  MS: Adequate though reduced ROM spine, shoulders, hips and knees.  Skin: Intact, no ulcerations or rash noted.  Psych: Good eye contact, normal affect. Memory intact  anxious and mildlydepressed appearing.  CNS: CN 2-12 intact, power,  normal throughout.no focal deficits noted.   Assessment &  Plan  Hearing loss C/o reduced hearing, refer ENT for E/M  Subacute sinusitis Pressure behind eyes and over cheeks, augmentin prescribed refer ENT as chronic complaint also  Chronic nasal congestion Concerned that she may have a nasal polyp, ENT to E/M  Essential hypertension DASH diet and commitment to daily physical activity for a minimum of 30 minutes discussed and encouraged, as a part of hypertension management. The importance of attaining a healthy weight is also discussed.     07/20/2022    4:16 PM 05/21/2022    9:03 AM 05/21/2022    7:23 AM 05/07/2022    3:55 PM 01/26/2022    4:01 PM 12/01/2021    2:02 PM 07/21/2021    3:43 PM  BP/Weight  Systolic BP 536 144 315 400 [phone]  Diastolic BP 79 69 64 76 80 79 79  Wt. (Lbs) 284.12  280 288 280 278.12 285  BMI 51.97 kg/m2  51.21 kg/m2 52.68 kg/m2 51.21 kg/m2 50.87 kg/m2 52.13 kg/m2     Controlled, no change in medication   Morbid obesity ([place])  Patient re-educated about  the importance of commitment to a  minimum of 150 minutes of exercise per week as able.  The importance of healthy food choices with portion control discussed, as well as eating regularly and within a 12 hour window most days. The need to choose "clean , green" food 50 to 75% of the time is discussed, as well as to make water the primary drink and set a  goal of 64 ounces water daily.       07/20/2022    4:16 PM 05/21/2022    7:23 AM 05/07/2022    3:55 PM  Weight /BMI  Weight 284 lb 1.9 oz 280 lb 288 lb  Height 5' 2"  (1.575 m) 5' 2"  (1.575 m) 5' 2"  (1.575 m)  BMI 51.97 kg/m2 51.21 kg/m2 52.68 kg/m2    Needs to commit to lifestyle change, saxenda prescribed , f/u in 3 months

## 2022-07-20 NOTE — Patient Instructions (Signed)
F/u in 3 months, call if you need me sooner  Flu vaccine today  Medications prescribed for sinus infection  Please get covid vaccine  Write questions and take 1 family member with you to your gI Appointment  Work with your medication  Thanks for choosing [facility], we consider it a privelige to serve you.

## 2022-07-21 ENCOUNTER — Other Ambulatory Visit: Payer: Self-pay | Admitting: Family Medicine

## 2022-07-22 NOTE — Telephone Encounter (Signed)
Ok to refill 

## 2022-07-23 ENCOUNTER — Other Ambulatory Visit (INDEPENDENT_AMBULATORY_CARE_PROVIDER_SITE_OTHER): Payer: Self-pay | Admitting: Gastroenterology

## 2022-07-23 ENCOUNTER — Other Ambulatory Visit: Payer: Self-pay | Admitting: Family Medicine

## 2022-07-23 ENCOUNTER — Other Ambulatory Visit: Payer: Self-pay

## 2022-07-23 ENCOUNTER — Telehealth: Payer: Self-pay

## 2022-07-23 DIAGNOSIS — R062 Wheezing: Secondary | ICD-10-CM

## 2022-07-23 DIAGNOSIS — J3089 Other allergic rhinitis: Secondary | ICD-10-CM

## 2022-07-23 DIAGNOSIS — B9681 Helicobacter pylori [H. pylori] as the cause of diseases classified elsewhere: Secondary | ICD-10-CM

## 2022-07-23 MED ORDER — SAXENDA 18 MG/3ML ~~LOC~~ SOPN
1.8000 mg | PEN_INJECTOR | Freq: Every day | SUBCUTANEOUS | 2 refills | Status: DC
Start: 2022-07-23 — End: 2023-02-07

## 2022-07-23 NOTE — Telephone Encounter (Signed)
Patient called medicine has not been sent in to pharmacy yet.    Said the request was denied by provider and patient said this should not have been denied patient needed a refill on this medicine. please contact patient.   [place]: [facility]

## 2022-07-26 ENCOUNTER — Encounter: Payer: Self-pay | Admitting: Family Medicine

## 2022-07-26 DIAGNOSIS — R0981 Nasal congestion: Secondary | ICD-10-CM | POA: Insufficient documentation

## 2022-07-26 DIAGNOSIS — J019 Acute sinusitis, unspecified: Secondary | ICD-10-CM | POA: Insufficient documentation

## 2022-07-26 DIAGNOSIS — H9190 Unspecified hearing loss, unspecified ear: Secondary | ICD-10-CM | POA: Insufficient documentation

## 2022-07-26 NOTE — Telephone Encounter (Signed)
Appt nov 6th ( not October)

## 2022-07-26 NOTE — Assessment & Plan Note (Signed)
Concerned that she may have a nasal polyp, ENT to E/M

## 2022-07-26 NOTE — Assessment & Plan Note (Signed)
  Patient re-educated about  the importance of commitment to a  minimum of 150 minutes of exercise per week as able.  The importance of healthy food choices with portion control discussed, as well as eating regularly and within a 12 hour window most days. The need to choose "clean , green" food 50 to 75% of the time is discussed, as well as to make water the primary drink and set a goal of 64 ounces water daily.       07/20/2022    4:16 PM 05/21/2022    7:23 AM 05/07/2022    3:55 PM  Weight /BMI  Weight 284 lb 1.9 oz 280 lb 288 lb  Height 5' 2"  (1.575 m) 5' 2"  (1.575 m) 5' 2"  (1.575 m)  BMI 51.97 kg/m2 51.21 kg/m2 52.68 kg/m2    Needs to commit to lifestyle change, saxenda prescribed , f/u in 3 months

## 2022-07-26 NOTE — Assessment & Plan Note (Signed)
C/o reduced hearing, refer ENT for E/M

## 2022-07-26 NOTE — Telephone Encounter (Signed)
Last seen by dr Laural Golden 01/26/22. Has upcoming appt with you in october

## 2022-07-26 NOTE — Assessment & Plan Note (Signed)
Pressure behind eyes and over cheeks, augmentin prescribed refer ENT as chronic complaint also

## 2022-07-26 NOTE — Assessment & Plan Note (Signed)
DASH diet and commitment to daily physical activity for a minimum of 30 minutes discussed and encouraged, as a part of hypertension management. The importance of attaining a healthy weight is also discussed.     07/20/2022    4:16 PM 05/21/2022    9:03 AM 05/21/2022    7:23 AM 05/07/2022    3:55 PM 01/26/2022    4:01 PM 12/01/2021    2:02 PM 07/21/2021    3:43 PM  BP/Weight  Systolic BP 127 517 001 [phone] 916  Diastolic BP 79 69 64 76 80 79 79  Wt. (Lbs) 284.12  280 288 280 278.12 285  BMI 51.97 kg/m2  51.21 kg/m2 52.68 kg/m2 51.21 kg/m2 50.87 kg/m2 52.13 kg/m2     Controlled, no change in medication

## 2022-08-02 ENCOUNTER — Ambulatory Visit (INDEPENDENT_AMBULATORY_CARE_PROVIDER_SITE_OTHER): Payer: BC Managed Care – PPO | Admitting: Gastroenterology

## 2022-08-02 ENCOUNTER — Encounter (INDEPENDENT_AMBULATORY_CARE_PROVIDER_SITE_OTHER): Payer: Self-pay | Admitting: Gastroenterology

## 2022-08-02 VITALS — BP 137/79 | HR 66 | Temp 97.8°F | Ht 62.0 in | Wt 286.4 lb

## 2022-08-02 DIAGNOSIS — K7460 Unspecified cirrhosis of liver: Secondary | ICD-10-CM | POA: Diagnosis not present

## 2022-08-02 DIAGNOSIS — I8510 Secondary esophageal varices without bleeding: Secondary | ICD-10-CM | POA: Diagnosis not present

## 2022-08-02 DIAGNOSIS — Z9884 Bariatric surgery status: Secondary | ICD-10-CM | POA: Diagnosis not present

## 2022-08-02 DIAGNOSIS — I8500 Esophageal varices without bleeding: Secondary | ICD-10-CM | POA: Insufficient documentation

## 2022-08-02 MED ORDER — CARVEDILOL 12.5 MG PO TABS: 12.5000 mg | ORAL_TABLET | Freq: Two times a day (BID) | ORAL | 1 refills | Status: DC

## 2022-08-02 NOTE — Patient Instructions (Signed)
Start Saxenda when available to achieve weight loss Try to eat multiple small portions during the day instead of big meals Try to stay more active Perform blood workup Increase carvedilol to 12.5 mg BID - Reduce salt intake to <2 g per day - Can take Tylenol max of 2 g per day (650 mg q8h) for pain - Avoid NSAIDs for pain - Avoid eating raw oysters/shellfish - Protein shake (Ensure or Boost) every night before going to sleep

## 2022-08-02 NOTE — Progress Notes (Unsigned)
Holly Alvarado, M.D. Gastroenterology & Hepatology [facility] [address] [place], [place] [address]  Primary Care Physician: Holly Helper, MD [address] [place] [address]  I will communicate my assessment and recommendations to the referring MD via EMR.  Problems: NASH cirrhosis Grade II esophageal varices History of H. Pylori infection  History of Present Illness: Holly Alvarado is a 56 y.o. 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 Pylori, obesity, Htn, HLD, anxiety, GERD, who presents for follow up of cirrhosis.  The patient was last seen on 01/26/2022. At that time, the patient was advised to increase her physical activity.  She was scheduled for EGD with finding described below. Started carvedilol 6.25 mg BID and has been tolerating well.  Patient was sent quadruple bismuth regimen for H. Pylori found in biopsies. She took the medication compliantly.  She reports that for the last 2 years she has been having very intermittent episdoes of pain in her RUQ and back, which may get better with Tylenol - 500 mg. She is currently taking pantoprazole 40 mg twice a day.  Sometimes feels nauseated but this is occasional. Denies any other complaints such as nausea, vomiting, fever, chills, hematochezia, melena, hematemesis, diarrhea, jaundice, pruritus.  Patient has gained 6 pounds since the last time seen in clinic. Has tried to keep active but it has been difficult as she has some feet issues.  She is waiting for the pharmacy to get Saxenda prescribed for weight loss as the medication is isn back order.  She takes a protein shake in AM only.  Cirrhosis related questions: Hematemesis/coffee ground emesis: No History of variceal bleeding: No Abdominal pain: No Abdominal distention/worsening ascitesNo Fever/chills: No Episodes of confusion/disorientation: No Taking diuretics?: No Prior  history of banding?: No Prior episodes of SBP: No Last time liver imaging was performed: 04/28/2022 US Doppler did not show any portal vein thrombosis, presence of splenomegaly, no masses  Last EGD: 05/21/2022 Grade II varices were found in the lower third of the esophagus. A 1 cm hiatal hernia was present. Evidence of a gastric bypass was found. A gastric pouch with a 7 cm length from the GE junction to the gastrojejunal anastomosis was found. The staple line appeared intact. There was a small polyp at the GJ junction which had mild inflammatory appearance. The gastrojejunal anastomosis was characterized by healthy appearing mucosa. The duodenum-to-jejunum limb was examined. Biopsies were taken with a cold forceps for Helicobacter pylori testing. The examined jejunum was normal - both limbs.  A.   STOMACH, POLYPECTOMY:  -    Mildly active chronic gastritis, with associated Helicobacter  pylori infection (H. pylori immunohistochemical (IHC) stain positive for  organisms, with adequate controls).  -    Focal intestinal metaplasia.  -    Chromogranin IHC stain without ECL cell hyperplasia, with adequate  controls.  -   No vasculopathy identified.   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   Advised to repeat in in 3 years.  Last Colonoscopy:2019 Melanosis of the colon, external hemorrhoids  Advised to repeat in 5 years  Past Medical History: Past Medical History:  Diagnosis Date   Allergic rhinitis, seasonal    Anemia    hx of    Anxiety    hx of panic attacks and panic attacks with surgery    Anxiety  disorder    Chronic constipation    Complication of anesthesia    woke up during surgery of tubal ligation    Esophageal polyp    Fatty liver    GERD (gastroesophageal reflux disease)    Hemorrhoids    Hypertension    Obesity     Past Surgical History: Past Surgical  History:  Procedure Laterality Date   BIOPSY  05/21/2022   Procedure: BIOPSY;  Surgeon: Harvel Quale, MD;  Location: [facility];  Service: Gastroenterology;;   [place] Right 2010   benign   BREATH TEK H PYLORI N/A 02/25/2014   Procedure: BREATH TEK H PYLORI;  Surgeon: Pedro Earls, MD;  Location: [facility];  Service: General;  Laterality: N/A;   CHOLECYSTECTOMY  1997   COLONOSCOPY  12/30/2010   COLONOSCOPY N/A 11/23/2017   Procedure: COLONOSCOPY;  Surgeon: Rogene Houston, MD;  Location: [facility];  Service: Endoscopy;  Laterality: N/A;  [place]   ESOPHAGOGASTRODUODENOSCOPY (EGD) WITH PROPOFOL N/A 05/21/2022   Procedure: ESOPHAGOGASTRODUODENOSCOPY (EGD) WITH PROPOFOL;  Surgeon: Harvel Quale, MD;  Location: [facility];  Service: Gastroenterology;  Laterality: N/A;  [address]   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   Procedure: LAPAROSCOPIC ROUX-EN-Y GASTRIC BYPASS WITH UPPER ENDOSCOPY;  Surgeon: Pedro Earls, MD;  Location: [facility];  Service: General;  Laterality: N/A;   MOUTH SURGERY     wisdom tooth extraction   PARTIAL HYSTERECTOMY     TOTAL ABDOMINAL HYSTERECTOMY  03/11/08   for fibroids    TUBAL LIGATION     UPPER GASTROINTESTINAL ENDOSCOPY  12/30/2010   UPPER GASTROINTESTINAL ENDOSCOPY  05/11/04   UPPER GASTROINTESTINAL ENDOSCOPY  04/30/97   [name]    Family History: Family History  Problem Relation Age of Onset   Hypertension Mother    COPD Mother    Depression Mother    Hyperlipidemia Father    Hypertension Father    Depression Father    Alcohol abuse Father    Drug abuse Father    Hyperlipidemia Sister    Hypertension Sister    Hyperlipidemia Sister    Hypertension Sister    Diabetes Other        family history    Asthma Other        family history    Arthritis Other        family history    Stroke Other    Cancer Other    Heart disease Other    Schizophrenia Maternal  Uncle    Colon cancer Maternal Aunt    Colon cancer Maternal Aunt    Colon cancer Maternal Aunt    Colon cancer Maternal Aunt    Colon cancer Maternal Aunt    Colon cancer Maternal Aunt     Social History: Social History   Tobacco Use  Smoking Status Never   Passive exposure: Never  Smokeless Tobacco Never   Social History   Substance and Sexual Activity  Alcohol Use No   Alcohol/week: 0.0 standard drinks of alcohol   Social History   Substance and Sexual Activity  Drug Use No    Allergies: Allergies  Allergen Reactions   Codeine     Severe headache.     Sulfa Antibiotics Hives   Tape Rash    Foam tape - caused blisters and severe itching   Miconazole     swelling   Sulfonamide Derivatives  Hives and Itching    Medications: Current Outpatient Medications  Medication Sig Dispense Refill   acetaminophen (TYLENOL) 500 MG tablet Take 500-1,000 mg by mouth every 6 (six) hours as needed for moderate pain.     albuterol (VENTOLIN HFA) 108 (90 Base) MCG/ACT inhaler Inhale 2 puffs into the lungs every 6 (six) hours as needed for wheezing or shortness of breath.     alum & mag hydroxide-simeth (MAALOX/MYLANTA) 200-200-20 MG/5ML suspension Take 15-30 mLs by mouth every 6 (six) hours as needed for indigestion or heartburn.     amLODipine (NORVASC) 2.5 MG tablet TAKE 1 TABLET BY MOUTH EVERY DAY 90 tablet 3   carvedilol (COREG) 6.25 MG tablet TAKE 1 TABLET BY MOUTH TWICE A DAY 180 tablet 3   diphenhydrAMINE (BENADRYL) 25 MG tablet Take 25 mg by mouth daily as needed for itching.     fluticasone (FLONASE) 50 MCG/ACT nasal spray PLACE 2 SPRAYS INTO BOTH NOSTRILS DAILY AS NEEDED FOR ALLERGIES OR RHINITIS. (Patient taking differently: Place 1 spray into both nostrils daily as needed for allergies or rhinitis.) 48 mL 1   hydrOXYzine (ATARAX/VISTARIL) 10 MG tablet TAKE ONE TABLET BY MOUTH AT BEDTIME, FOR SLEEP AND ANXIETY (Patient taking differently: Take 10 mg by mouth at bedtime as  needed for anxiety.) 90 tablet 1   Liraglutide -Weight Management (SAXENDA) 18 MG/3ML SOPN Inject 1.8 mg into the skin daily. 9 mL 2   loratadine (CLARITIN) 10 MG tablet Take 10 mg by mouth daily as needed for allergies.     Multiple Vitamin (MULTIVITAMIN WITH MINERALS) TABS tablet Take 1 tablet by mouth 2 (two) times daily.     ondansetron (ZOFRAN) 4 MG tablet TAKE 1 TABLET BY MOUTH EVERY 8 HOURS AS NEEDED FOR NAUSEA AND VOMITING 18 tablet 3   pantoprazole (PROTONIX) 40 MG tablet Take 1 tablet (40 mg total) by mouth 2 (two) times daily before a meal. (Patient taking differently: Take 40 mg by mouth 2 (two) times daily as needed (acid reflux).) 180 tablet 3   polyethylene glycol (MIRALAX / GLYCOLAX) 17 g packet Take 17 g by mouth daily as needed for moderate constipation.     venlafaxine XR (EFFEXOR XR) 150 MG 24 hr capsule Take 1 capsule (150 mg total) by mouth daily with breakfast. 90 capsule 5   No current facility-administered medications for this visit.    Review of Systems: GENERAL: negative for malaise, night sweats HEENT: No changes in hearing or vision, no nose bleeds or other nasal problems. NECK: Negative for lumps, goiter, pain and significant neck swelling RESPIRATORY: Negative for cough, wheezing CARDIOVASCULAR: Negative for chest pain, leg swelling, palpitations, orthopnea GI: SEE HPI MUSCULOSKELETAL: Negative for joint pain or swelling, back pain, and muscle pain. SKIN: Negative for lesions, rash PSYCH: Negative for sleep disturbance, mood disorder and recent psychosocial stressors. HEMATOLOGY Negative for prolonged bleeding, bruising easily, and swollen nodes. ENDOCRINE: Negative for cold or heat intolerance, polyuria, polydipsia and goiter. NEURO: negative for tremor, gait imbalance, syncope and seizures. The remainder of the review of systems is noncontributory.   Physical Exam: BP 137/79 (BP Location: Right Arm, Patient Position: Sitting, Cuff Size: Large)   Pulse 66    Temp 97.8 F (36.6 C) (Oral)   Ht 5' 2"  (1.575 m)   Wt 286 lb 6.4 oz (129.9 kg)   BMI 52.38 kg/m  GENERAL: The patient is AO x3, in no acute distress. Obese. HEENT: Head is normocephalic and atraumatic. EOMI are intact. Mouth is well hydrated  and without lesions. NECK: Supple. No masses LUNGS: Clear to auscultation. No presence of rhonchi/wheezing/rales. Adequate chest expansion HEART: RRR, normal s1 and s2. ABDOMEN: Soft, nontender, no guarding, no peritoneal signs, and nondistended. BS +. No masses. EXTREMITIES: Without any cyanosis, clubbing, rash, lesions or edema. NEUROLOGIC: AOx3, no focal motor deficit. SKIN: no jaundice, no rashes  Imaging/Labs: as above  I personally reviewed and interpreted the available labs, imaging and endoscopic files.  Impression and Plan: Holly Alvarado is a 56 y.o. 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 Pylori, obesity, Htn, HLD, anxiety, GERD, who presents for follow up of cirrhosis. Patient has not presented any decompensating event but has evidence of portal hypertension given presence of varices in most recent EGD. No recent labs are available, will check CBC, MELD labs and AFP at this point. I discussed with her that achieving further weight loss may help preventing further progression of her liver disease - she already had a gastric bypass. Given her cirrhosis, surgical options are not a good option at this time but [place] may help achieve some more weight loss.  She as tolerated well Coreg. Will try to optimize her dose further for EV prophylaxis by increasing Coreg to 12.5 mg BID - max dose in cirrhotics with HTN.  - Start Saxenda when available to work on weight loss - Try to eat multiple small portions during the day instead of big meals - Try to be more active - Check CBC, CMP, INR and AFP - Increase carvedilol to 12.5 mg BID - Reduce salt intake to <2 g per day - Can take Tylenol max of 2 g per day (650 mg q8h)  for pain - Avoid NSAIDs for pain - Avoid eating raw oysters/shellfish - Protein shake (Ensure or Boost) every night before going to sleep  All questions were answered.      Holly Peppers, MD Gastroenterology and Hepatology [facility]

## 2022-08-03 ENCOUNTER — Telehealth: Payer: Self-pay | Admitting: Family Medicine

## 2022-08-03 NOTE — Telephone Encounter (Signed)
Pt called stating she is on a waiting list for a medication at [facility]. States she has requested a refill on her mychart but it is stating there is something about needing a PA. Pt is asking for nurse to give her a call about this.

## 2022-08-06 NOTE — Telephone Encounter (Signed)
Pa submitted

## 2022-08-16 ENCOUNTER — Encounter: Payer: Self-pay | Admitting: Family Medicine

## 2022-08-17 NOTE — Telephone Encounter (Signed)
Pa resubmitted with weight changes

## 2022-08-19 LAB — LIPID PANEL
Chol/HDL Ratio: 2.9 ratio (ref 0.0–4.4)
Cholesterol, Total: 100 mg/dL (ref 100–199)
HDL: 34 mg/dL — ABNORMAL LOW (ref >39)
LDL Chol Calc (NIH): 52 mg/dL (ref 0–99)
Triglycerides: 66 mg/dL (ref 0–149)
VLDL Cholesterol Cal: 14 mg/dL (ref 5–40)

## 2022-08-19 LAB — TSH: TSH: 0.718 u[IU]/mL (ref 0.450–4.500)

## 2022-08-20 LAB — PROTIME-INR
INR: 1.1
Prothrombin Time: 11.5 s (ref 9.0–11.5)

## 2022-08-20 LAB — COMPREHENSIVE METABOLIC PANEL
AG Ratio: 0.8 (calc) — ABNORMAL LOW (ref 1.0–2.5)
ALT: 39 U/L — ABNORMAL HIGH (ref 6–29)
AST: 74 U/L — ABNORMAL HIGH (ref 10–35)
Albumin: 3.3 g/dL — ABNORMAL LOW (ref 3.6–5.1)
Alkaline phosphatase (APISO): 142 U/L (ref 37–153)
BUN: 13 mg/dL (ref 7–25)
CO2: 24 mmol/L (ref 20–32)
Calcium: 9.2 mg/dL (ref 8.6–10.4)
Chloride: 113 mmol/L — ABNORMAL HIGH (ref 98–110)
Creat: 0.85 mg/dL (ref 0.50–1.03)
Globulin: 4 g/dL (calc) — ABNORMAL HIGH (ref 1.9–3.7)
Glucose, Bld: 141 mg/dL — ABNORMAL HIGH (ref 65–99)
Potassium: 4.1 mmol/L (ref 3.5–5.3)
Sodium: 141 mmol/L (ref 135–146)
Total Bilirubin: 0.8 mg/dL (ref 0.2–1.2)
Total Protein: 7.3 g/dL (ref 6.1–8.1)

## 2022-08-20 LAB — CBC WITH DIFFERENTIAL/PLATELET
Absolute Monocytes: 585 cells/uL (ref 200–950)
Basophils Absolute: 41 cells/uL (ref 0–200)
Basophils Relative: 0.6 %
Eosinophils Absolute: 150 cells/uL (ref 15–500)
Eosinophils Relative: 2.2 %
HCT: 43.3 % (ref 35.0–45.0)
Hemoglobin: 13.8 g/dL (ref 11.7–15.5)
Lymphs Abs: 4644 cells/uL — ABNORMAL HIGH (ref 850–3900)
MCH: 23.5 pg — ABNORMAL LOW (ref 27.0–33.0)
MCHC: 31.9 g/dL — ABNORMAL LOW (ref 32.0–36.0)
MCV: 73.6 fL — ABNORMAL LOW (ref 80.0–100.0)
MPV: 12.8 fL — ABNORMAL HIGH (ref 7.5–12.5)
Monocytes Relative: 8.6 %
Neutro Abs: 1380 cells/uL — ABNORMAL LOW (ref 1500–7800)
Neutrophils Relative %: 20.3 %
Platelets: 200 10*3/uL (ref 140–400)
RBC: 5.88 10*6/uL — ABNORMAL HIGH (ref 3.80–5.10)
RDW: 15.3 % — ABNORMAL HIGH (ref 11.0–15.0)
Total Lymphocyte: 68.3 %
WBC: 6.8 10*3/uL (ref 3.8–10.8)

## 2022-08-20 LAB — AFP TUMOR MARKER: AFP-Tumor Marker: 5.8 ng/mL

## 2022-08-25 ENCOUNTER — Encounter (INDEPENDENT_AMBULATORY_CARE_PROVIDER_SITE_OTHER): Payer: Self-pay | Admitting: Gastroenterology

## 2022-09-03 ENCOUNTER — Telehealth: Payer: BC Managed Care – PPO | Admitting: Physician Assistant

## 2022-09-03 DIAGNOSIS — J208 Acute bronchitis due to other specified organisms: Secondary | ICD-10-CM | POA: Diagnosis not present

## 2022-09-03 DIAGNOSIS — B9689 Other specified bacterial agents as the cause of diseases classified elsewhere: Secondary | ICD-10-CM

## 2022-09-03 MED ORDER — POLYMYXIN B-TRIMETHOPRIM 10000-0.1 UNIT/ML-% OP SOLN: 1.0000 [drp] | OPHTHALMIC | 0 refills | Status: DC

## 2022-09-03 NOTE — Progress Notes (Signed)
E-Visit for [employer]   We are sorry that you are not feeling well.  Here is how we plan to help!  Based on what you have shared with me it looks like you have conjunctivitis.  Conjunctivitis is a common inflammatory or infectious condition of the eye that is often referred to as "pink eye".  In most cases it is contagious (viral or bacterial). However, not all conjunctivitis requires antibiotics (ex. Allergic).  We have made appropriate suggestions for you based upon your presentation.  I have prescribed Polytrim Ophthalmic drops 1-2 drops 4 times a day times 5 days  Pink eye can be highly contagious.  It is typically spread through direct contact with secretions, or contaminated objects or surfaces that one may have touched.  Strict handwashing is suggested with soap and water is urged.  If not available, use alcohol based had sanitizer.  Avoid unnecessary touching of the eye.  If you wear contact lenses, you will need to refrain from wearing them until you see no white discharge from the eye for at least 24 hours after being on medication.  You should see symptom improvement in 1-2 days after starting the medication regimen.  Call us if symptoms are not improved in 1-2 days.  Home Care: Wash your hands often! Do not wear your contacts until you complete your treatment plan. Avoid sharing towels, bed linen, personal items with a person who has pink eye. See attention for anyone in your home with similar symptoms.  Get Help Right Away If: Your symptoms do not improve. You develop blurred or loss of vision. Your symptoms worsen (increased discharge, pain or redness)   Thank you for choosing an e-visit.  Your e-visit answers were reviewed by a board certified advanced clinical practitioner to complete your personal care plan. Depending upon the condition, your plan could have included both over the counter or prescription medications.  Please review your pharmacy choice. Make sure the  pharmacy is open so you can pick up prescription now. If there is a problem, you may contact your provider through [employer] and have the prescription routed to another pharmacy.  Your safety is important to [place]. If you have drug allergies check your prescription carefully.   For the next 24 hours you can use MyChart to ask questions about today's visit, request a non-urgent call back, or ask for a work or school excuse. You will get an email in the next two days asking about your experience. I hope that your e-visit has been valuable and will speed your recovery.  I have spent 5 minutes in review of e-visit questionnaire, review and updating patient chart, medical decision making and response to patient.   Mar Daring, PA-C

## 2022-09-22 ENCOUNTER — Ambulatory Visit: Payer: BC Managed Care – PPO

## 2022-09-22 ENCOUNTER — Ambulatory Visit
Admission: RE | Admit: 2022-09-22 | Discharge: 2022-09-22 | Disposition: A | Discharge status: Home / Self Care | Payer: BC Managed Care – PPO | Source: Ambulatory Visit | Attending: Family Medicine | Admitting: Family Medicine

## 2022-09-24 ENCOUNTER — Other Ambulatory Visit: Payer: Self-pay | Admitting: Family Medicine

## 2022-09-24 DIAGNOSIS — R928 Other abnormal and inconclusive findings on diagnostic imaging of breast: Secondary | ICD-10-CM

## 2022-09-28 ENCOUNTER — Encounter: Payer: Self-pay | Admitting: Family Medicine

## 2022-09-29 ENCOUNTER — Other Ambulatory Visit: Payer: Self-pay

## 2022-09-29 MED ORDER — VENLAFAXINE HCL ER 150 MG PO CP24: 150.0000 mg | ORAL_CAPSULE | Freq: Every day | ORAL | 5 refills | Status: DC

## 2022-09-30 ENCOUNTER — Ambulatory Visit
Admission: RE | Admit: 2022-09-30 | Discharge: 2022-09-30 | Disposition: A | Discharge status: Home / Self Care | Payer: BC Managed Care – PPO | Source: Ambulatory Visit | Attending: Family Medicine | Admitting: Family Medicine

## 2022-09-30 ENCOUNTER — Other Ambulatory Visit: Payer: Self-pay | Admitting: Family Medicine

## 2022-09-30 DIAGNOSIS — N6320 Unspecified lump in the left breast, unspecified quadrant: Secondary | ICD-10-CM

## 2022-09-30 DIAGNOSIS — R928 Other abnormal and inconclusive findings on diagnostic imaging of breast: Secondary | ICD-10-CM

## 2022-10-07 ENCOUNTER — Ambulatory Visit
Admission: RE | Admit: 2022-10-07 | Discharge: 2022-10-07 | Disposition: A | Discharge status: Home / Self Care | Payer: BC Managed Care – PPO | Source: Ambulatory Visit | Attending: Family Medicine | Admitting: Family Medicine

## 2022-10-07 DIAGNOSIS — N6320 Unspecified lump in the left breast, unspecified quadrant: Secondary | ICD-10-CM

## 2022-10-07 HISTORY — PX: BREAST BIOPSY: SHX20

## 2022-10-11 NOTE — Progress Notes (Signed)
LMTRC--kb

## 2022-10-12 ENCOUNTER — Encounter (INDEPENDENT_AMBULATORY_CARE_PROVIDER_SITE_OTHER): Payer: Self-pay | Admitting: *Deleted

## 2022-10-19 ENCOUNTER — Encounter: Payer: Self-pay | Admitting: Family Medicine

## 2022-10-19 ENCOUNTER — Ambulatory Visit: Payer: BC Managed Care – PPO | Admitting: Family Medicine

## 2022-10-19 VITALS — BP 120/74 | HR 72 | Resp 16 | Ht 62.0 in | Wt 270.0 lb

## 2022-10-19 DIAGNOSIS — I10 Essential (primary) hypertension: Secondary | ICD-10-CM

## 2022-10-19 DIAGNOSIS — E559 Vitamin D deficiency, unspecified: Secondary | ICD-10-CM | POA: Diagnosis not present

## 2022-10-19 DIAGNOSIS — K7460 Unspecified cirrhosis of liver: Secondary | ICD-10-CM

## 2022-10-19 DIAGNOSIS — E785 Hyperlipidemia, unspecified: Secondary | ICD-10-CM | POA: Diagnosis not present

## 2022-10-19 DIAGNOSIS — R7303 Prediabetes: Secondary | ICD-10-CM

## 2022-10-19 DIAGNOSIS — B369 Superficial mycosis, unspecified: Secondary | ICD-10-CM

## 2022-10-19 DIAGNOSIS — I8510 Secondary esophageal varices without bleeding: Secondary | ICD-10-CM

## 2022-10-19 MED ORDER — TERBINAFINE HCL 250 MG PO TABS
250.0000 mg | ORAL_TABLET | Freq: Every day | ORAL | 0 refills | Status: DC
Start: 2022-10-19 — End: 2023-03-16

## 2022-10-19 MED ORDER — CARVEDILOL 6.25 MG PO TABS: 6.2500 mg | ORAL_TABLET | Freq: Two times a day (BID) | ORAL | 3 refills | Status: DC

## 2022-10-19 MED ORDER — FLUCONAZOLE 100 MG PO TABS: 100.0000 mg | ORAL_TABLET | Freq: Every day | ORAL | 0 refills | Status: DC

## 2022-10-19 NOTE — Patient Instructions (Addendum)
F/u in 10 weeks, call if you need me sooner   Congrats on weight loss, keep it up B Pis excellent  Tablets sent  for rash left groin  Fasting lipid, cmp an deGFr,  and vit D 5 days before next visit  It is important that you exercise regularly at least 30 minutes 5 times a week. If you develop chest pain, have severe difficulty breathing, or feel very tired, stop exercising immediately and seek medical attention

## 2022-10-24 ENCOUNTER — Encounter: Payer: Self-pay | Admitting: Family Medicine

## 2022-10-24 DIAGNOSIS — E785 Hyperlipidemia, unspecified: Secondary | ICD-10-CM | POA: Insufficient documentation

## 2022-10-24 NOTE — Assessment & Plan Note (Signed)
Updated lab needed at/ before next visit.   

## 2022-10-24 NOTE — Progress Notes (Signed)
Holly Alvarado     MRN: [id number]      DOB: 05/24/1966   HPI Holly Alvarado is here for follow up and re-evaluation of chronic medical conditions, medication management and review of any available recent lab and radiology data.  Preventive health is updated, specifically  Cancer screening and Immunization.   Questions or concerns regarding consultations or procedures which the PT has had in the interim are  addressed. The PT denies any adverse reactions to current medications since the last visit.  C/o rash, itching ad skin breakdown in groin x 2 weks getting worse despite topicals applied   ROS Denies recent fever or chills. Denies sinus pressure, nasal congestion, ear pain or sore throat. Denies chest congestion, productive cough or wheezing. Denies chest pains, palpitations and leg swelling Denies abdominal pain, nausea, vomiting,diarrhea or constipation.   Denies dysuria, frequency, hesitancy or incontinence. Denies joint pain, swelling and limitation in mobility. Denies headaches, seizures, numbness, or tingling. Denies depression, anxiety or insomnia.   PE  BP 120/74   Pulse 72   Resp 16   Ht '5\' 2"'$  (1.575 m)   Wt 270 lb (122.5 kg)   SpO2 100%   BMI 49.38 kg/m   Patient alert and oriented and in no cardiopulmonary distress.  HEENT: No facial asymmetry, EOMI,     Neck supple .  Chest: Clear to auscultation bilaterally.  CVS: S1, S2 no murmurs, no S3.Regular rate.  ABD: Soft non tender.   Ext: No edema  MS: Adequate ROM spine, shoulders, hips and knees.  Skin: Fungal and yeast rash , severe in inguinal regipon bilaterally, no drainage noted.  Psych: Good eye contact, normal affect. Memory intact not anxious or depressed appearing.  CNS: CN 2-12 intact, power,  normal throughout.no focal deficits noted.   Assessment & Plan  Essential hypertension Controlled, no change in medication DASH diet and commitment to daily physical activity for a minimum of 30  minutes discussed and encouraged, as a part of hypertension management. The importance of attaining a healthy weight is also discussed.     10/19/2022    5:06 PM 10/19/2022    4:28 PM 08/02/2022    3:48 PM 07/20/2022    4:16 PM 05/21/2022    9:03 AM 05/21/2022    7:23 AM 05/07/2022    3:55 PM  BP/Weight  Systolic BP 832 919 166 060 045 997 741  Diastolic BP 74 90 79 79 69 64 76  Wt. (Lbs)  270 286.4 284.12  280 288  BMI  49.38 kg/m2 52.38 kg/m2 51.97 kg/m2  51.21 kg/m2 52.68 kg/m2       Obesity, morbid ([place]) Improvedcontinue management  Patient re-educated about  the importance of commitment to a  minimum of 150 minutes of exercise per week as able.  The importance of healthy food choices with portion control discussed, as well as eating regularly and within a 12 hour window most days. The need to choose "clean , green" food 50 to 75% of the time is discussed, as well as to make water the primary drink and set a goal of 64 ounces water daily.       10/19/2022    4:28 PM 08/02/2022    3:48 PM 07/20/2022    4:16 PM  Weight /BMI  Weight 270 lb 286 lb 6.4 oz 284 lb 1.9 oz  Height '5\' 2"'$  (1.575 m) '5\' 2"'$  (1.575 m) '5\' 2"'$  (1.575 m)  BMI 49.38 kg/m2 52.38 kg/m2 51.97 kg/m2  Prediabetes Patient educated about the importance of limiting  Carbohydrate intake , the need to commit to daily physical activity for a minimum of 30 minutes , and to commit weight loss. The fact that changes in all these areas will reduce or eliminate all together the development of diabetes is stressed.      Latest Ref Rng & Units 08/18/2022    1:24 PM 08/18/2022   11:42 AM 01/07/2021   10:23 AM 12/12/2019    7:27 AM 11/01/2018    7:41 AM  Diabetic Labs  HbA1c <5.7 % of total Hgb    6.1  5.8   Chol 100 - 199 mg/dL  100  160  133    HDL >39 mg/dL  34  32  28    Calc LDL 0 - 99 mg/dL  52  103  85    Triglycerides 0 - 149 mg/dL  66  142  100    Creatinine 0.50 - 1.03 mg/dL 0.85   0.75  0.62  0.67        10/19/2022    5:06 PM 10/19/2022    4:28 PM 08/02/2022    3:48 PM 07/20/2022    4:16 PM 05/21/2022    9:03 AM 05/21/2022    7:23 AM 05/07/2022    3:55 PM  BP/Weight  Systolic BP 446 286 381 771 165 790 383  Diastolic BP 74 90 79 79 69 64 76  Wt. (Lbs)  270 286.4 284.12  280 288  BMI  49.38 kg/m2 52.38 kg/m2 51.97 kg/m2  51.21 kg/m2 52.68 kg/m2       No data to display          Updated lab needed at/ before next visit.   Dyslipidemia Hyperlipidemia:Low fat diet discussed and encouraged.   Lipid Panel  Lab Results  Component Value Date   CHOL 100 08/18/2022   HDL 34 (L) 08/18/2022   LDLCALC 52 08/18/2022   TRIG 66 08/18/2022   CHOLHDL 2.9 08/18/2022     Updated lab needed at/ before next visit.   Vitamin D deficiency Updated lab needed at/ before next visit.   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

## 2022-10-24 NOTE — Assessment & Plan Note (Signed)
Controlled, no change in medication DASH diet and commitment to daily physical activity for a minimum of 30 minutes discussed and encouraged, as a part of hypertension management. The importance of attaining a healthy weight is also discussed.     10/19/2022    5:06 PM 10/19/2022    4:28 PM 08/02/2022    3:48 PM 07/20/2022    4:16 PM 05/21/2022    9:03 AM 05/21/2022    7:23 AM 05/07/2022    3:55 PM  BP/Weight  Systolic BP 423 702 301 720 910 681 661  Diastolic BP 74 90 79 79 69 64 76  Wt. (Lbs)  270 286.4 284.12  280 288  BMI  49.38 kg/m2 52.38 kg/m2 51.97 kg/m2  51.21 kg/m2 52.68 kg/m2

## 2022-10-24 NOTE — Assessment & Plan Note (Signed)
Severe fungaland yast infection in groin, terbinafine and nystatin prescribed, advised regular use of corn starch or Vaseline as drying agents once healed

## 2022-10-24 NOTE — Assessment & Plan Note (Signed)
Patient educated about the importance of limiting  Carbohydrate intake , the need to commit to daily physical activity for a minimum of 30 minutes , and to commit weight loss. The fact that changes in all these areas will reduce or eliminate all together the development of diabetes is stressed.      Latest Ref Rng & Units 08/18/2022    1:24 PM 08/18/2022   11:42 AM 01/07/2021   10:23 AM 12/12/2019    7:27 AM 11/01/2018    7:41 AM  Diabetic Labs  HbA1c <5.7 % of total Hgb    6.1  5.8   Chol 100 - 199 mg/dL  100  160  133    HDL >39 mg/dL  34  32  28    Calc LDL 0 - 99 mg/dL  52  103  85    Triglycerides 0 - 149 mg/dL  66  142  100    Creatinine 0.50 - 1.03 mg/dL 0.85   0.75  0.62  0.67       10/19/2022    5:06 PM 10/19/2022    4:28 PM 08/02/2022    3:48 PM 07/20/2022    4:16 PM 05/21/2022    9:03 AM 05/21/2022    7:23 AM 05/07/2022    3:55 PM  BP/Weight  Systolic BP 115 520 802 233 612 244 975  Diastolic BP 74 90 79 79 69 64 76  Wt. (Lbs)  270 286.4 284.12  280 288  BMI  49.38 kg/m2 52.38 kg/m2 51.97 kg/m2  51.21 kg/m2 52.68 kg/m2       No data to display          Updated lab needed at/ before next visit.

## 2022-10-24 NOTE — Assessment & Plan Note (Signed)
Improvedcontinue management  Patient re-educated about  the importance of commitment to a  minimum of 150 minutes of exercise per week as able.  The importance of healthy food choices with portion control discussed, as well as eating regularly and within a 12 hour window most days. The need to choose "clean , green" food 50 to 75% of the time is discussed, as well as to make water the primary drink and set a goal of 64 ounces water daily.       10/19/2022    4:28 PM 08/02/2022    3:48 PM 07/20/2022    4:16 PM  Weight /BMI  Weight 270 lb 286 lb 6.4 oz 284 lb 1.9 oz  Height '5\' 2"'$  (1.575 m) '5\' 2"'$  (1.575 m) '5\' 2"'$  (1.575 m)  BMI 49.38 kg/m2 52.38 kg/m2 51.97 kg/m2

## 2022-10-24 NOTE — Assessment & Plan Note (Signed)
Hyperlipidemia:Low fat diet discussed and encouraged.   Lipid Panel  Lab Results  Component Value Date   CHOL 100 08/18/2022   HDL 34 (L) 08/18/2022   LDLCALC 52 08/18/2022   TRIG 66 08/18/2022   CHOLHDL 2.9 08/18/2022     Updated lab needed at/ before next visit.

## 2022-10-26 ENCOUNTER — Other Ambulatory Visit: Payer: Self-pay

## 2022-10-26 DIAGNOSIS — E559 Vitamin D deficiency, unspecified: Secondary | ICD-10-CM

## 2022-11-03 ENCOUNTER — Other Ambulatory Visit: Payer: Self-pay | Admitting: Family Medicine

## 2022-11-03 DIAGNOSIS — J3089 Other allergic rhinitis: Secondary | ICD-10-CM

## 2022-11-03 DIAGNOSIS — R062 Wheezing: Secondary | ICD-10-CM

## 2022-11-09 ENCOUNTER — Encounter: Payer: Self-pay | Admitting: Family Medicine

## 2022-11-11 ENCOUNTER — Encounter: Payer: Self-pay | Admitting: Family Medicine

## 2022-11-11 NOTE — Telephone Encounter (Signed)
The state health plan does not cover wegovy. I already told Dr Moshe Cipro this but she may have forgot

## 2023-01-04 ENCOUNTER — Ambulatory Visit: Payer: BC Managed Care – PPO | Admitting: Family Medicine

## 2023-01-21 ENCOUNTER — Other Ambulatory Visit: Payer: Self-pay | Admitting: General Surgery

## 2023-01-21 DIAGNOSIS — D242 Benign neoplasm of left breast: Secondary | ICD-10-CM

## 2023-01-26 ENCOUNTER — Ambulatory Visit: Payer: BC Managed Care – PPO | Admitting: Family Medicine

## 2023-01-31 ENCOUNTER — Ambulatory Visit (INDEPENDENT_AMBULATORY_CARE_PROVIDER_SITE_OTHER): Payer: BC Managed Care – PPO | Admitting: Gastroenterology

## 2023-02-03 ENCOUNTER — Other Ambulatory Visit: Payer: Self-pay | Admitting: General Surgery

## 2023-02-03 ENCOUNTER — Other Ambulatory Visit (INDEPENDENT_AMBULATORY_CARE_PROVIDER_SITE_OTHER): Payer: Self-pay | Admitting: Gastroenterology

## 2023-02-03 DIAGNOSIS — K7460 Unspecified cirrhosis of liver: Secondary | ICD-10-CM

## 2023-02-03 DIAGNOSIS — D242 Benign neoplasm of left breast: Secondary | ICD-10-CM

## 2023-02-03 DIAGNOSIS — I8510 Secondary esophageal varices without bleeding: Secondary | ICD-10-CM

## 2023-02-03 NOTE — Telephone Encounter (Signed)
Last seen 08/02/22.

## 2023-02-04 ENCOUNTER — Other Ambulatory Visit: Payer: Self-pay | Admitting: Family Medicine

## 2023-02-04 DIAGNOSIS — J3089 Other allergic rhinitis: Secondary | ICD-10-CM

## 2023-02-04 DIAGNOSIS — R062 Wheezing: Secondary | ICD-10-CM

## 2023-02-05 ENCOUNTER — Other Ambulatory Visit: Payer: Self-pay | Admitting: Family Medicine

## 2023-02-15 ENCOUNTER — Encounter: Payer: Self-pay | Admitting: Family Medicine

## 2023-02-16 ENCOUNTER — Other Ambulatory Visit: Payer: Self-pay

## 2023-02-16 MED ORDER — SAXENDA 18 MG/3ML ~~LOC~~ SOPN: PEN_INJECTOR | SUBCUTANEOUS | 2 refills | Status: DC

## 2023-02-16 NOTE — Telephone Encounter (Signed)
Note sent to pharmacy to start pa process on cover my meds

## 2023-02-22 ENCOUNTER — Telehealth: Payer: BC Managed Care – PPO | Admitting: Family Medicine

## 2023-02-22 DIAGNOSIS — J019 Acute sinusitis, unspecified: Secondary | ICD-10-CM | POA: Diagnosis not present

## 2023-02-22 DIAGNOSIS — B9689 Other specified bacterial agents as the cause of diseases classified elsewhere: Secondary | ICD-10-CM

## 2023-02-22 MED ORDER — AMOXICILLIN-POT CLAVULANATE 875-125 MG PO TABS
1.0000 | ORAL_TABLET | Freq: Two times a day (BID) | ORAL | 0 refills | Status: AC
Start: 2023-02-22 — End: 2023-03-02

## 2023-02-22 NOTE — Progress Notes (Signed)

## 2023-03-01 NOTE — Progress Notes (Signed)
     Your surgery and Pre-Admission testing visit will be at [facility] located at [address], [place], [place] [address].  Please let all your doctors (i.e., Primary Care Physician, Cardiologist, Endocrinologist, Pulmonologist) know you are having surgery. You may need clearance for surgery. If you are on blood thinners, notify your surgeon and ask the doctor who prescribed them how long to hold them before surgery.  If you have had a heart test, such as an EKG, stress test, heart ultrasound, etc., or lab work performed outside of [place], please bring copies of these tests to your Pre-Admission testing, if possible.  These departments may contact you before the day of surgery:  Pre-Service Center - insurance/ billing: [phone] Pharmacy- to review your medications: [phone] Pre-Admission Testing- to set an appointment for your visit: [phone]  (Often, these numbers show up as "SPAM" on your phone)  The Pre-Admission Testing (PAT) visit focuses on Anesthesia for your upcoming surgery.  You do NOT need to fast; take your medications as usual. Please arrive 30 minutes early to allow for parking and admitting.  The visit may last up to an hour. Bring a photo ID and medical insurance card. Reschedule if you are sick. ([phone]) and please, NO children under age 16 at the visit.  During the PAT visit:  We will review your medical and surgical history.   You will receive pre-operative instructions, including the time of arrival at the hospital and surgical start time.  We will review what medication(s) you can take on the day of surgery.  After speaking with the nurse, you will have blood drawn and, if needed, a chest x-ray and EKG.  Most lab results from your doctor are good for 30 days, Hemoglobin A1C is good for 60 days. If you cannot talk to the Pharmacy, bring your medications or a list of them to the PST visit.   Infection control for the [facility] requires: All fingernail and toenail products should be removed before the day of surgery.  (SNS, Acrylic, Gel, Polish, Stickers, Press on, and Poly gel nails.)   Parking information:  Address: [facility] - [address], [place], [place] [address]  Please look for signs for entrance A off of [address]. Free valet parking is available Monday-Friday 05:30am-06:00pm     

## 2023-03-03 NOTE — Pre-Procedure Instructions (Signed)
Surgical Instructions    Your procedure is scheduled on Tuesday, June 11th.  Report to [facility] Main Entrance "A" at 05:30 A.M., then check in with the Admitting office.  Call this number if you have problems the morning of surgery:  [phone]  If you have any questions prior to your surgery date call [phone]: Open Monday-Friday 8am-4pm If you experience any cold or flu symptoms such as cough, fever, chills, shortness of breath, etc. between now and your scheduled surgery, please notify us at the above number.     Remember:  Do not eat after midnight the night before your surgery  You may drink clear liquids until 04:30 AM the morning of your surgery.   Clear liquids allowed are: Water, Non-Citrus Juices (without pulp), Carbonated Beverages, Clear Tea, Black Coffee Only (NO MILK, CREAM OR POWDERED CREAMER of any kind), and Gatorade.   Patient Instructions  The night before surgery:  No food after midnight. ONLY clear liquids after midnight  The day of surgery (if you do NOT have diabetes):  Drink ONE (1) Pre-Surgery Clear Ensure by 04:30 AM the morning of surgery. Drink in one sitting. Do not sip.  This drink was given to you during your hospital  pre-op appointment visit.  Nothing else to drink after completing the  Pre-Surgery Clear Ensure.          If you have questions, please contact your surgeon's office.     Take these medicines the morning of surgery with A SIP OF WATER  amLODipine (NORVASC)  carvedilol (COREG)  pantoprazole (PROTONIX)  venlafaxine XR (EFFEXOR XR)   If needed: acetaminophen (TYLENOL)  diphenhydrAMINE (BENADRYL)  fluticasone (FLONASE)  ondansetron (ZOFRAN)    As of today, STOP taking any Aspirin (unless otherwise instructed by your surgeon) Aleve, Naproxen, Ibuprofen, Motrin, Advil, Goody's, BC's, all herbal medications, fish oil, and all vitamins.                     Do NOT Smoke (Tobacco/Vaping) for 24 hours prior to your  procedure.  If you use a CPAP at night, you may bring your mask/headgear for your overnight stay.   Contacts, glasses, piercing's, hearing aid's, dentures or partials may not be worn into surgery, please bring cases for these belongings.    For patients admitted to the hospital, discharge time will be determined by your treatment team.   Patients discharged the day of surgery will not be allowed to drive home, and someone needs to stay with them for 24 hours.  SURGICAL WAITING ROOM VISITATION Patients having surgery or a procedure may have no more than 2 support people in the waiting area - these visitors may rotate.   Children under the age of 74 must have an adult with them who is not the patient. If the patient needs to stay at the hospital during part of their recovery, the visitor guidelines for inpatient rooms apply. Pre-op nurse will coordinate an appropriate time for 1 support person to accompany patient in pre-op.  This support person may not rotate.   Please refer to the [facility] website for the visitor guidelines for Inpatients (after your surgery is over and you are in a regular room).    Special instructions:   [facility]- Preparing For Surgery  Before surgery, you can play an important role. Because skin is not sterile, your skin needs to be as free of germs as possible. You can reduce the number of germs on your skin by  washing with CHG (chlorahexidine gluconate) Soap before surgery.  CHG is an antiseptic cleaner which kills germs and bonds with the skin to continue killing germs even after washing.    Oral Hygiene is also important to reduce your risk of infection.  Remember - BRUSH YOUR TEETH THE MORNING OF SURGERY WITH YOUR REGULAR TOOTHPASTE  Please do not use if you have an allergy to CHG or antibacterial soaps. If your skin becomes reddened/irritated stop using the CHG.  Do not shave (including legs and underarms) for at least 48 hours prior to first CHG shower. It  is OK to shave your face.  Please follow these instructions carefully.   Shower the NIGHT BEFORE SURGERY and the MORNING OF SURGERY  If you chose to wash your hair, wash your hair first as usual with your normal shampoo.  After you shampoo, rinse your hair and body thoroughly to remove the shampoo.  Use CHG Soap as you would any other liquid soap. You can apply CHG directly to the skin and wash gently with a scrungie or a clean washcloth.   Apply the CHG Soap to your body ONLY FROM THE NECK DOWN.  Do not use on open wounds or open sores. Avoid contact with your eyes, ears, mouth and genitals (private parts). Wash Face and genitals (private parts)  with your normal soap.   Wash thoroughly, paying special attention to the area where your surgery will be performed.  Thoroughly rinse your body with warm water from the neck down.  DO NOT shower/wash with your normal soap after using and rinsing off the CHG Soap.  Pat yourself dry with a CLEAN TOWEL.  Wear CLEAN PAJAMAS to bed the night before surgery  Place CLEAN SHEETS on your bed the night before your surgery  DO NOT SLEEP WITH PETS.   Day of Surgery: Take a shower with CHG soap. Do not wear jewelry or makeup Do not wear lotions, powders, perfumes, or deodorant. Do not shave 48 hours prior to surgery.   Do not bring valuables to the hospital. [facility] is not responsible for any belongings or valuables. Do not wear nail polish, gel polish, artificial nails, or any other type of covering on natural nails (fingers and toes) If you have artificial nails or gel coating that need to be removed by a nail salon, please have this removed prior to surgery. Artificial nails or gel coating may interfere with anesthesia's ability to adequately monitor your vital signs. Wear Clean/Comfortable clothing the morning of surgery Remember to brush your teeth WITH YOUR REGULAR TOOTHPASTE.   Please read over the following fact sheets that you were  given.    If you received a COVID test during your pre-op visit  it is requested that you wear a mask when out in public, stay away from anyone that may not be feeling well and notify your surgeon if you develop symptoms. If you have been in contact with anyone that has tested positive in the last 10 days please notify you surgeon.

## 2023-03-04 ENCOUNTER — Encounter (HOSPITAL_COMMUNITY): Payer: Self-pay

## 2023-03-04 ENCOUNTER — Other Ambulatory Visit: Payer: Self-pay

## 2023-03-04 ENCOUNTER — Encounter (HOSPITAL_COMMUNITY)
Admission: RE | Admit: 2023-03-04 | Discharge: 2023-03-04 | Disposition: A | Discharge status: Home / Self Care | Payer: BC Managed Care – PPO | Source: Ambulatory Visit | Attending: General Surgery | Admitting: General Surgery

## 2023-03-04 VITALS — BP 146/95 | HR 98 | Temp 98.0°F | Resp 18 | Ht 62.0 in | Wt 282.3 lb

## 2023-03-04 DIAGNOSIS — K219 Gastro-esophageal reflux disease without esophagitis: Secondary | ICD-10-CM | POA: Diagnosis not present

## 2023-03-04 DIAGNOSIS — R7303 Prediabetes: Secondary | ICD-10-CM | POA: Insufficient documentation

## 2023-03-04 DIAGNOSIS — Z6841 Body Mass Index (BMI) 40.0 and over, adult: Secondary | ICD-10-CM | POA: Diagnosis not present

## 2023-03-04 DIAGNOSIS — F419 Anxiety disorder, unspecified: Secondary | ICD-10-CM | POA: Insufficient documentation

## 2023-03-04 DIAGNOSIS — K769 Liver disease, unspecified: Secondary | ICD-10-CM | POA: Insufficient documentation

## 2023-03-04 DIAGNOSIS — Z9049 Acquired absence of other specified parts of digestive tract: Secondary | ICD-10-CM | POA: Insufficient documentation

## 2023-03-04 DIAGNOSIS — Z01818 Encounter for other preprocedural examination: Secondary | ICD-10-CM | POA: Diagnosis present

## 2023-03-04 DIAGNOSIS — I10 Essential (primary) hypertension: Secondary | ICD-10-CM | POA: Diagnosis not present

## 2023-03-04 DIAGNOSIS — N6320 Unspecified lump in the left breast, unspecified quadrant: Secondary | ICD-10-CM | POA: Insufficient documentation

## 2023-03-04 HISTORY — DX: Esophageal varices without bleeding: I85.00

## 2023-03-04 HISTORY — DX: Thalassemia minor: D56.3

## 2023-03-04 LAB — COMPREHENSIVE METABOLIC PANEL
ALT: 54 U/L — ABNORMAL HIGH (ref 0–44)
AST: 89 U/L — ABNORMAL HIGH (ref 15–41)
Albumin: 3 g/dL — ABNORMAL LOW (ref 3.5–5.0)
Alkaline Phosphatase: 140 U/L — ABNORMAL HIGH (ref 38–126)
Anion gap: 6 (ref 5–15)
BUN: 8 mg/dL (ref 6–20)
CO2: 24 mmol/L (ref 22–32)
Calcium: 9.3 mg/dL (ref 8.9–10.3)
Chloride: 109 mmol/L (ref 98–111)
Creatinine, Ser: 0.84 mg/dL (ref 0.44–1.00)
GFR, Estimated: >60 mL/min (ref >60)
Glucose, Bld: 93 mg/dL (ref 70–99)
Potassium: 3.7 mmol/L (ref 3.5–5.1)
Sodium: 139 mmol/L (ref 135–145)
Total Bilirubin: 0.8 mg/dL (ref 0.3–1.2)
Total Protein: 7.9 g/dL (ref 6.5–8.1)

## 2023-03-04 LAB — CBC
HCT: 43 % (ref 36.0–46.0)
Hemoglobin: 13.6 g/dL (ref 12.0–15.0)
MCH: 23.2 pg — ABNORMAL LOW (ref 26.0–34.0)
MCHC: 31.6 g/dL (ref 30.0–36.0)
MCV: 73.3 fL — ABNORMAL LOW (ref 80.0–100.0)
Platelets: 196 10*3/uL (ref 150–400)
RBC: 5.87 MIL/uL — ABNORMAL HIGH (ref 3.87–5.11)
RDW: 15.6 % — ABNORMAL HIGH (ref 11.5–15.5)
WBC: 7.7 10*3/uL (ref 4.0–10.5)
nRBC: 0 % (ref 0.0–0.2)

## 2023-03-04 NOTE — Progress Notes (Signed)
PCP - Syliva Overman, MD Cardiologist - denies Gastroenterologist- Katrinka Blazing, M.D.    PPM/ICD - denies   Chest x-ray - N/A EKG - 03/04/2023  Stress Test - denies ECHO - denies Cardiac Cath - denies  Sleep Study - denies   Fasting Blood Sugar - N/A   Last dose of GLP1 agonist-  Pt has not taken Saxenda in over a month d/t insurance not covering cost. Pt informed to notify pre-op if she is prescribed any new medications. Pt instructed not to take Saxenda prior to surgery.    Blood Thinner Instructions: N/A Aspirin Instructions:N/A  ERAS Protcol - ERAS + ensure   COVID TEST- N/A   Anesthesia review: pt with seed placement scheduled for 03/08/23. Pt recent treated for sinus issues. Pt finishing augmentin today. Pt states she originally had a cough/sinus drainage beginning around 02/17/23. Pt was prescribed abx on 02/22/23. Pt states she no longer has cough and feels well other than still having some sinus drainage.   Patient denies shortness of breath, fever, cough and chest pain at PAT appointment   All instructions explained to the patient, with a verbal understanding of the material. Patient agrees to go over the instructions while at home for a better understanding. The opportunity to ask questions was provided.

## 2023-03-04 NOTE — Progress Notes (Addendum)
Anesthesia PAT Evaluation:  Case: [id number] Date/Time: 03/08/23 0715   Procedure: RADIOACTIVE SEED GUIDED EXCISIONAL LEFT BREAST BIOPSY (Left: Breast)   Anesthesia type: General   Pre-op diagnosis: LEFT BREAST MASS   [facility] ROOM 02 / [facility] OR   Surgeons: Emelia Loron, MD       DISCUSSION: Patient is a 57 year old female scheduled for the above procedure. Per notes by Dr. Dwain Sarna, "Mammogram and ultrasound showed a 5 x 5 x 2 mm lobular mass at 9:00 in the left breast 4 cm from the nipple. This underwent a biopsy. 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 Dwain Sarna recommended seed guided excision under anesthesia.   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 BMI 51.63. She reported panic attack for previous GYN surgery while waiting in [facility]--staff did not answer when she called so she panicked.    NASH cirrhosis is followed by GI Dr. Levon Hedger. Last visit 08/02/22. Last EGD on 05/21/22 showed: Grade 2 esophageal varices found in the lower third of the esophagus, 1 cm hiatal hernia, gastric bypass with pouch 7 cm in length and intact staple line with healthy appearing gastrojejunal anastomosis, normal examined jejunum. Biopsies showed mildly active chronic gastritis with stain + H. Pylori, focal intestinal metaplasia, Chromogranin IHC stain without ECL cell hyperplasia, no vasculopathy identified. 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 S/p treatment for H. Pylori. She is on Coreg for HTN and EV prophylaxis. She has known mild elevation of LFTS, but denied  issues with thrombocytopenia. PAT labs appear stable.   She had E-visit on 02/22/23 by Tereasa Coop, NP and treated for acute sinusitis. Prescribed Augmentin 875mg /125mg  BID x 7 days. Mucinex and saline nasal rinses as needed.   I evaluated patient during her PAT visit due to recent Augment for "sinusitis". She says that around 02/14/23 she had ~ 24 hours of N/V/D. About 2 days later she had facial pain and her throat and sinuses felt "raw", ears were "stopped up", and she had intermittent laryngitis. She also had a coarse, dry cough. No fever. She has chronic yearly allergies but with worsening symptoms, she requested the e-visit because historically if she does not get treatment early, then she tends to develop otitis media and/or bronchitis. She started the Augmentin and symptoms improved after ~ 3 days. She also used as needed Benadryl, nasal saline rinses, and continued her usual allergy medication. She occasionally has to clear her throat with whitish phlegm, but now feels well and recovered. 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   EKG showed new right BBB since 07/01/14. She denied known history of CAD, CHF, arrhythmia. She says she is able to walk up 2 flights of stairs, walk 3 laps around the school track at a reasonable pace, and is able to clean her house without chest pain or significant SOB or need to stop and rest. No pitting edema.   She preemptively sought treatment for sinus symptoms last month as detailed above. She now feels recovered and appears well on exam. She does not have a breast cancer diagnosis at this point, but did have atypia and left breast lumpectomy has been recommended. She is scheduled for RSL  on 03/07/23 and surgery on 03/08/23. She also has a new right BBB, but denied CV symptoms and reported exercise tolerance of > 4 METS. Discussed with anesthesiologist Autumn Patty, MD. Anesthesia team to evaluate on the day of surgery. She was advised to let  Dr. Doreen Salvage office know if any new changes in the interim.    She is no longer on an GLP-1 agonist.  Given previous panic attack while waiting surgery to begin, she is thankful to be a first case.    VS: BP (!) 146/95   Pulse 98   Temp 36.7 C   Resp 18   Ht 5\' 2"  (1.575 m)   Wt 128.1 kg   SpO2 98%   BMI 51.63 kg/m  Provider wore mask during physical exam. Patient appeared well. No conversational dyspnea. Heart RRR, no murmur noted. Lungs clear. Upper denture noted. Morbidly obese. No pitting edema of her LE. No coughing, hoarseness, or congestion noted.    PROVIDERS: Kerri Perches, MD is PCP  Marguerita Merles, MD is GI   LABS: Labs reviewed: Acceptable for surgery.  AST 89 and ALT 54, but known NASH cirrhosis with chronic mild LFT elevation--appear overall stable since at least 07/2018. PLT count normal.  (all labs ordered are listed, but only abnormal results are displayed)  Labs Reviewed  COMPREHENSIVE METABOLIC PANEL - Abnormal; Notable for the following components:      Result Value   Albumin 3.0 (*)    AST 89 (*)    ALT 54 (*)    Alkaline Phosphatase 140 (*)    All other components within normal limits  CBC - Abnormal; Notable for the following components:   RBC 5.87 (*)    MCV 73.3 (*)    MCH 23.2 (*)    RDW 15.6 (*)    All other components within normal limits     EKG: EKG 03/04/23: Normal sinus rhythm Right bundle branch block When compared with ECG of 04-Jul-2014 12:55, Right bundle branch block is new Confirmed by Rollene Rotunda ([id number]) on 03/04/2023 3:24:36 PM  CV: N/A   Past Medical History:  Diagnosis Date   Allergic rhinitis, seasonal    Anemia    hx of    Anxiety    hx of panic attacks and panic attacks with surgery    Anxiety disorder    Beta thalassemia trait    Chronic constipation    Complication of anesthesia    woke up during surgery of tubal ligation    Esophageal polyp    Esophageal varices (HCC)    Fatty liver     GERD (gastroesophageal reflux disease)    Hemorrhoids    Hypertension    Obesity     Past Surgical History:  Procedure Laterality Date   BIOPSY  05/21/2022   Procedure: BIOPSY;  Surgeon: Dolores Frame, MD;  Location: [facility];  Service: Gastroenterology;;   BLT  1987   BREAST BIOPSY Right 2010   benign   BREAST BIOPSY Left 10/07/2022   [place] LT BREAST BX W LOC DEV 1ST LESION IMG BX SPEC US GUIDE 10/07/2022 [facility] MAMMOGRAPHY   BREATH TEK H PYLORI N/A 02/25/2014   Procedure: BREATH TEK H PYLORI;  Surgeon: Valarie Merino, MD;  Location: [facility];  Service: General;  Laterality: N/A;   CHOLECYSTECTOMY  1997   COLONOSCOPY  12/30/2010   COLONOSCOPY N/A 11/23/2017   Procedure: COLONOSCOPY;  Surgeon: Malissa Hippo, MD;  Location: [facility];  Service:  Endoscopy;  Laterality: N/A;  1130   ECTOPIC PREGNANCY SURGERY  1994, 1995   ESOPHAGOGASTRODUODENOSCOPY (EGD) WITH PROPOFOL N/A 05/21/2022   Procedure: ESOPHAGOGASTRODUODENOSCOPY (EGD) WITH PROPOFOL;  Surgeon: Dolores Frame, MD;  Location: [facility];  Service: Gastroenterology;  Laterality: N/A;  900 ASA 2   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   Procedure: LAPAROSCOPIC ROUX-EN-Y GASTRIC BYPASS WITH UPPER ENDOSCOPY;  Surgeon: Valarie Merino, MD;  Location: [facility];  Service: General;  Laterality: N/A;   MOUTH SURGERY     wisdom tooth extraction   PARTIAL HYSTERECTOMY     TOTAL ABDOMINAL HYSTERECTOMY  03/11/08   for fibroids    TUBAL LIGATION     UPPER GASTROINTESTINAL ENDOSCOPY  12/30/2010   UPPER GASTROINTESTINAL ENDOSCOPY  05/11/04   UPPER GASTROINTESTINAL ENDOSCOPY  04/30/97   Linna Darner    MEDICATIONS:  acetaminophen (TYLENOL) 500 MG tablet   alum & mag hydroxide-simeth (MAALOX/MYLANTA) 200-200-20 MG/5ML suspension   amLODipine (NORVASC) 2.5 MG tablet   carvedilol (COREG) 12.5 MG tablet   carvedilol (COREG) 6.25 MG tablet   diphenhydrAMINE (BENADRYL) 25 MG tablet   fluconazole (DIFLUCAN) 100 MG tablet    fluticasone (FLONASE) 50 MCG/ACT nasal spray   levocetirizine (XYZAL) 5 MG tablet   Liraglutide -Weight Management (SAXENDA) 18 MG/3ML SOPN   ondansetron (ZOFRAN) 4 MG tablet   ondansetron (ZOFRAN-ODT) 4 MG disintegrating tablet   pantoprazole (PROTONIX) 40 MG tablet   polyethylene glycol (MIRALAX / GLYCOLAX) 17 g packet   terbinafine (LAMISIL) 250 MG tablet   trimethoprim-polymyxin b (POLYTRIM) ophthalmic solution   venlafaxine XR (EFFEXOR XR) 150 MG 24 hr capsule   Vitamin D, Ergocalciferol, (DRISDOL) 1.25 MG (50000 UNIT) CAPS capsule   No current facility-administered medications for this encounter.   She is not currently taking Polytrim ophthalmic solution, Lamisil, Saxenda, Diflucan.   Shonna Chock, PA-C Surgical Short Stay/Anesthesiology [facility] Phone [phone] [facility] Phone [phone] 03/04/2023 5:47 PM

## 2023-03-04 NOTE — Anesthesia Preprocedure Evaluation (Signed)
Anesthesia Evaluation  Patient identified by MRN, date of birth, ID band Patient awake    Reviewed: Allergy & Precautions, NPO status , Patient's Chart, lab work & pertinent test results, reviewed documented beta blocker date and time   Airway Mallampati: II  TM Distance: >3 FB Neck ROM: Full   Comment: Previous grade I view with MAC 4, easy mask Dental  (+) Dental Advisory Given, Edentulous Upper, Missing   Pulmonary asthma , PE (2019)   Pulmonary exam normal breath sounds clear to auscultation       Cardiovascular hypertension (furosemide, ISMN, losartan, metoprolol), Pt. on medications and Pt. on home beta blockers (-) angina + CAD, + Past MI, + Cardiac Stents (DES 2008, 2016), + CABG (2008) and +CHF  Normal cardiovascular exam+ dysrhythmias (WPW, PVCs)  Rhythm:Regular Rate:Normal  HLD  TTE 01/06/2019: IMPRESSIONS     1. The left ventricle has normal systolic function, with an ejection  fraction of 55-60%. The cavity size was normal. There is mildly increased  left ventricular wall thickness. Left ventricular diastolic Doppler  parameters are indeterminate.   2. No evidence of mitral valve stenosis.   3. The aortic valve is tricuspid. Moderate thickening of the aortic  valve. Moderate calcification of the aortic valve. No stenosis of the  aortic valve. Mild to moderate aortic annular calcification noted.   4. The aortic root is normal in size and structure.   5. The interatrial septum was not well visualized.     Neuro/Psych  Neuromuscular disease (peripheral neuropathy)    GI/Hepatic hiatal hernia (1960s),GERD  Medicated,,Diverticulosis    Endo/Other    Renal/GU CRFRenal disease     Musculoskeletal  (+) Arthritis ,    Abdominal   Peds  Hematology  (+) Blood dyscrasia (Plavix), anemia   Anesthesia Other Findings Last Plavix: 03/02/2023  Reproductive/Obstetrics                               Anesthesia Physical Anesthesia Plan  ASA: 3  Anesthesia Plan: General   Post-op Pain Management: Ofirmev IV (intra-op)*   Induction: Intravenous  PONV Risk Score and Plan: 2 and Ondansetron, Dexamethasone and Treatment may vary due to age or medical condition  Airway Management Planned: Oral ETT  Additional Equipment:   Intra-op Plan:   Post-operative Plan: Extubation in OR  Informed Consent: I have reviewed the patients History and Physical, chart, labs and discussed the procedure including the risks, benefits and alternatives for the proposed anesthesia with the patient or authorized representative who has indicated his/her understanding and acceptance.     Dental advisory given  Plan Discussed with: CRNA and Anesthesiologist  Anesthesia Plan Comments: (PAT note written 03/04/2023 by Clary Boulais, PA-C.  Risks of general anesthesia discussed including, but not limited to, sore throat, hoarse voice, chipped/damaged teeth, injury to vocal cords, nausea and vomiting, allergic reactions, lung infection, heart attack, stroke, and death. All questions answered.   )        Anesthesia Quick Evaluation  

## 2023-03-07 ENCOUNTER — Ambulatory Visit
Admission: RE | Admit: 2023-03-07 | Discharge: 2023-03-07 | Disposition: A | Discharge status: Home / Self Care | Payer: BC Managed Care – PPO | Source: Ambulatory Visit | Attending: General Surgery | Admitting: General Surgery

## 2023-03-07 DIAGNOSIS — D242 Benign neoplasm of left breast: Secondary | ICD-10-CM

## 2023-03-07 HISTORY — PX: BREAST BIOPSY: SHX20

## 2023-03-08 ENCOUNTER — Ambulatory Visit (HOSPITAL_COMMUNITY)
Admission: RE | Admit: 2023-03-08 | Discharge: 2023-03-08 | Disposition: A | Discharge status: Home / Self Care | Payer: BC Managed Care – PPO | Attending: General Surgery | Admitting: General Surgery

## 2023-03-08 ENCOUNTER — Ambulatory Visit (HOSPITAL_COMMUNITY): Payer: BC Managed Care – PPO | Admitting: Vascular Surgery

## 2023-03-08 ENCOUNTER — Other Ambulatory Visit: Payer: Self-pay

## 2023-03-08 ENCOUNTER — Encounter (HOSPITAL_COMMUNITY)
Admission: RE | Disposition: A | Discharge status: Home / Self Care | Payer: Self-pay | Source: Home / Self Care | Attending: General Surgery

## 2023-03-08 ENCOUNTER — Ambulatory Visit (HOSPITAL_COMMUNITY): Payer: BC Managed Care – PPO | Admitting: Certified Registered"

## 2023-03-08 ENCOUNTER — Ambulatory Visit
Admission: RE | Admit: 2023-03-08 | Discharge: 2023-03-08 | Disposition: A | Discharge status: Home / Self Care | Payer: BC Managed Care – PPO | Source: Ambulatory Visit | Attending: General Surgery | Admitting: General Surgery

## 2023-03-08 ENCOUNTER — Encounter (HOSPITAL_COMMUNITY): Payer: Self-pay | Admitting: General Surgery

## 2023-03-08 DIAGNOSIS — I10 Essential (primary) hypertension: Secondary | ICD-10-CM | POA: Diagnosis not present

## 2023-03-08 DIAGNOSIS — K219 Gastro-esophageal reflux disease without esophagitis: Secondary | ICD-10-CM | POA: Insufficient documentation

## 2023-03-08 DIAGNOSIS — D242 Benign neoplasm of left breast: Secondary | ICD-10-CM

## 2023-03-08 DIAGNOSIS — N6022 Fibroadenosis of left breast: Secondary | ICD-10-CM | POA: Insufficient documentation

## 2023-03-08 DIAGNOSIS — F419 Anxiety disorder, unspecified: Secondary | ICD-10-CM | POA: Diagnosis not present

## 2023-03-08 DIAGNOSIS — F32A Depression, unspecified: Secondary | ICD-10-CM | POA: Diagnosis not present

## 2023-03-08 DIAGNOSIS — Z6841 Body Mass Index (BMI) 40.0 and over, adult: Secondary | ICD-10-CM | POA: Diagnosis not present

## 2023-03-08 HISTORY — PX: RADIOACTIVE SEED GUIDED EXCISIONAL BREAST BIOPSY: SHX6490

## 2023-03-08 SURGERY — RADIOACTIVE SEED GUIDED BREAST BIOPSY
Anesthesia: General | Site: Breast | Laterality: Left

## 2023-03-08 MED ORDER — ONDANSETRON HCL 4 MG/2ML IJ SOLN
INTRAMUSCULAR | Status: DC | PRN
  Administered 2023-03-08: 4 mg via INTRAVENOUS

## 2023-03-08 MED ORDER — FENTANYL CITRATE (PF) 100 MCG/2ML IJ SOLN: 25.0000 ug | INTRAMUSCULAR | Status: DC | PRN

## 2023-03-08 MED ORDER — ACETAMINOPHEN 650 MG RE SUPP: 650.0000 mg | RECTAL | Status: DC | PRN

## 2023-03-08 MED ORDER — DEXAMETHASONE SODIUM PHOSPHATE 10 MG/ML IJ SOLN
INTRAMUSCULAR | Status: DC | PRN
  Administered 2023-03-08: 10 mg via INTRAVENOUS

## 2023-03-08 MED ORDER — ONDANSETRON HCL 4 MG/2ML IJ SOLN
INTRAMUSCULAR | Status: AC
  Filled 2023-03-08: qty 2

## 2023-03-08 MED ORDER — OXYCODONE HCL 5 MG PO TABS: 5.0000 mg | ORAL_TABLET | Freq: Once | ORAL | Status: DC | PRN

## 2023-03-08 MED ORDER — BUPIVACAINE-EPINEPHRINE (PF) 0.25% -1:200000 IJ SOLN
INTRAMUSCULAR | Status: AC
  Filled 2023-03-08: qty 30

## 2023-03-08 MED ORDER — SODIUM CHLORIDE 0.9 % IV SOLN: 250.0000 mL | INTRAVENOUS | Status: DC | PRN

## 2023-03-08 MED ORDER — ACETAMINOPHEN 500 MG PO TABS: 1000.0000 mg | ORAL_TABLET | Freq: Once | ORAL | Status: DC

## 2023-03-08 MED ORDER — MIDAZOLAM HCL 2 MG/2ML IJ SOLN
INTRAMUSCULAR | Status: DC | PRN
  Administered 2023-03-08: 2 mg via INTRAVENOUS

## 2023-03-08 MED ORDER — LACTATED RINGERS IV SOLN: INTRAVENOUS | Status: DC

## 2023-03-08 MED ORDER — ORAL CARE MOUTH RINSE: 15.0000 mL | Freq: Once | OROMUCOSAL | Status: AC

## 2023-03-08 MED ORDER — GLYCOPYRROLATE PF 0.2 MG/ML IJ SOSY
PREFILLED_SYRINGE | INTRAMUSCULAR | Status: AC
  Filled 2023-03-08: qty 1

## 2023-03-08 MED ORDER — OXYCODONE HCL 5 MG PO TABS: 5.0000 mg | ORAL_TABLET | ORAL | Status: DC | PRN

## 2023-03-08 MED ORDER — ENSURE PRE-SURGERY PO LIQD: 296.0000 mL | Freq: Once | ORAL | Status: DC

## 2023-03-08 MED ORDER — GLYCOPYRROLATE 0.2 MG/ML IJ SOLN
INTRAMUSCULAR | Status: DC | PRN
  Administered 2023-03-08: .1 mg via INTRAVENOUS

## 2023-03-08 MED ORDER — CHLORHEXIDINE GLUCONATE CLOTH 2 % EX PADS: 6.0000 | MEDICATED_PAD | Freq: Once | CUTANEOUS | Status: DC

## 2023-03-08 MED ORDER — SODIUM CHLORIDE 0.9% FLUSH: 3.0000 mL | INTRAVENOUS | Status: DC | PRN

## 2023-03-08 MED ORDER — PROPOFOL 10 MG/ML IV BOLUS
INTRAVENOUS | Status: DC | PRN
  Administered 2023-03-08: 200 mg via INTRAVENOUS

## 2023-03-08 MED ORDER — ACETAMINOPHEN 500 MG PO TABS
1000.0000 mg | ORAL_TABLET | ORAL | Status: AC
  Administered 2023-03-08: 1000 mg via ORAL

## 2023-03-08 MED ORDER — ACETAMINOPHEN 325 MG PO TABS: 650.0000 mg | ORAL_TABLET | ORAL | Status: DC | PRN

## 2023-03-08 MED ORDER — LIDOCAINE 2% (20 MG/ML) 5 ML SYRINGE
INTRAMUSCULAR | Status: DC | PRN
  Administered 2023-03-08: 80 mg via INTRAVENOUS

## 2023-03-08 MED ORDER — CEFAZOLIN IN SODIUM CHLORIDE 3-0.9 GM/100ML-% IV SOLN
3.0000 g | INTRAVENOUS | Status: DC
  Filled 2023-03-08: qty 100

## 2023-03-08 MED ORDER — DEXTROSE 5 % IV SOLN
INTRAVENOUS | Status: DC | PRN
  Administered 2023-03-08: 3 g via INTRAVENOUS

## 2023-03-08 MED ORDER — DEXAMETHASONE SODIUM PHOSPHATE 10 MG/ML IJ SOLN
INTRAMUSCULAR | Status: AC
  Filled 2023-03-08: qty 1

## 2023-03-08 MED ORDER — PROPOFOL 10 MG/ML IV BOLUS
INTRAVENOUS | Status: AC
  Filled 2023-03-08: qty 20

## 2023-03-08 MED ORDER — CHLORHEXIDINE GLUCONATE 0.12 % MT SOLN
15.0000 mL | Freq: Once | OROMUCOSAL | Status: AC
  Administered 2023-03-08: 15 mL via OROMUCOSAL
  Filled 2023-03-08: qty 15

## 2023-03-08 MED ORDER — 0.9 % SODIUM CHLORIDE (POUR BTL) OPTIME
TOPICAL | Status: DC | PRN
  Administered 2023-03-08: 1000 mL

## 2023-03-08 MED ORDER — MIDAZOLAM HCL 2 MG/2ML IJ SOLN
INTRAMUSCULAR | Status: AC
  Filled 2023-03-08: qty 2

## 2023-03-08 MED ORDER — ROCURONIUM BROMIDE 10 MG/ML (PF) SYRINGE
PREFILLED_SYRINGE | INTRAVENOUS | Status: DC | PRN
  Administered 2023-03-08: 10 mg via INTRAVENOUS

## 2023-03-08 MED ORDER — OXYCODONE HCL 5 MG/5ML PO SOLN: 5.0000 mg | Freq: Once | ORAL | Status: DC | PRN

## 2023-03-08 MED ORDER — FENTANYL CITRATE (PF) 250 MCG/5ML IJ SOLN
INTRAMUSCULAR | Status: AC
  Filled 2023-03-08: qty 5

## 2023-03-08 MED ORDER — FENTANYL CITRATE (PF) 250 MCG/5ML IJ SOLN
INTRAMUSCULAR | Status: DC | PRN
  Administered 2023-03-08: 50 ug via INTRAVENOUS

## 2023-03-08 MED ORDER — EPHEDRINE SULFATE-NACL 50-0.9 MG/10ML-% IV SOSY
PREFILLED_SYRINGE | INTRAVENOUS | Status: DC | PRN
  Administered 2023-03-08: 5 mg via INTRAVENOUS

## 2023-03-08 MED ORDER — BUPIVACAINE-EPINEPHRINE 0.25% -1:200000 IJ SOLN
INTRAMUSCULAR | Status: DC | PRN
  Administered 2023-03-08: 20 mL

## 2023-03-08 MED ORDER — SUCCINYLCHOLINE CHLORIDE 200 MG/10ML IV SOSY
PREFILLED_SYRINGE | INTRAVENOUS | Status: DC | PRN
  Administered 2023-03-08: 140 mg via INTRAVENOUS

## 2023-03-08 MED ORDER — AMISULPRIDE (ANTIEMETIC) 5 MG/2ML IV SOLN: 10.0000 mg | Freq: Once | INTRAVENOUS | Status: DC | PRN

## 2023-03-08 SURGICAL SUPPLY — 43 items
ADH SKN CLS APL DERMABOND .7 (GAUZE/BANDAGES/DRESSINGS) ×1
APL PRP STRL LF DISP 70% ISPRP (MISCELLANEOUS) ×1
APPLIER CLIP 9.375 MED OPEN (MISCELLANEOUS)
APR CLP MED 9.3 20 MLT OPN (MISCELLANEOUS)
BAG COUNTER SPONGE SURGICOUNT (BAG) ×1 IMPLANT
BAG SPNG CNTER NS LX DISP (BAG) ×1
BINDER BREAST LRG (GAUZE/BANDAGES/DRESSINGS) IMPLANT
BINDER BREAST XLRG (GAUZE/BANDAGES/DRESSINGS) IMPLANT
CANISTER SUCT 3000ML PPV (MISCELLANEOUS) ×1 IMPLANT
CHLORAPREP W/TINT 26 (MISCELLANEOUS) ×1 IMPLANT
CLIP APPLIE 9.375 MED OPEN (MISCELLANEOUS) IMPLANT
CLIP TI MEDIUM 6 (CLIP) ×1 IMPLANT
COVER PROBE W GEL 5X96 (DRAPES) ×1 IMPLANT
COVER SURGICAL LIGHT HANDLE (MISCELLANEOUS) ×1 IMPLANT
DERMABOND ADVANCED .7 DNX12 (GAUZE/BANDAGES/DRESSINGS) ×1 IMPLANT
DEVICE DUBIN SPECIMEN MAMMOGRA (MISCELLANEOUS) ×1 IMPLANT
DRAPE CHEST BREAST 15X10 FENES (DRAPES) ×1 IMPLANT
ELECT COATED BLADE 2.86 ST (ELECTRODE) ×1 IMPLANT
ELECT REM PT RETURN 9FT ADLT (ELECTROSURGICAL) ×1
ELECTRODE REM PT RTRN 9FT ADLT (ELECTROSURGICAL) ×1 IMPLANT
GLOVE BIO SURGEON STRL SZ7 (GLOVE) ×2 IMPLANT
GLOVE BIOGEL PI IND STRL 7.5 (GLOVE) ×1 IMPLANT
GOWN STRL REUS W/ TWL LRG LVL3 (GOWN DISPOSABLE) ×2 IMPLANT
GOWN STRL REUS W/TWL LRG LVL3 (GOWN DISPOSABLE) ×2
KIT BASIN OR (CUSTOM PROCEDURE TRAY) ×1 IMPLANT
KIT MARKER MARGIN INK (KITS) ×1 IMPLANT
LIGHT WAVEGUIDE WIDE FLAT (MISCELLANEOUS) IMPLANT
NDL HYPO 25GX1X1/2 BEV (NEEDLE) ×1 IMPLANT
NEEDLE HYPO 25GX1X1/2 BEV (NEEDLE) ×1 IMPLANT
NS IRRIG 1000ML POUR BTL (IV SOLUTION) ×1 IMPLANT
PACK GENERAL/GYN (CUSTOM PROCEDURE TRAY) ×1 IMPLANT
STRIP CLOSURE SKIN 1/2X4 (GAUZE/BANDAGES/DRESSINGS) ×1 IMPLANT
SUT MNCRL AB 4-0 PS2 18 (SUTURE) ×1 IMPLANT
SUT MON AB 5-0 PS2 18 (SUTURE) IMPLANT
SUT SILK 2 0 SH (SUTURE) IMPLANT
SUT VIC AB 2-0 SH 27 (SUTURE) ×1
SUT VIC AB 2-0 SH 27XBRD (SUTURE) ×1 IMPLANT
SUT VIC AB 3-0 SH 27 (SUTURE) ×2
SUT VIC AB 3-0 SH 27X BRD (SUTURE) ×1 IMPLANT
SUT VIC AB 3-0 SH 27XBRD (SUTURE) IMPLANT
SYR CONTROL 10ML LL (SYRINGE) ×1 IMPLANT
TOWEL GREEN STERILE (TOWEL DISPOSABLE) ×1 IMPLANT
TOWEL GREEN STERILE FF (TOWEL DISPOSABLE) ×1 IMPLANT

## 2023-03-08 NOTE — Anesthesia Postprocedure Evaluation (Signed)
Anesthesia Post Note  Patient: Holly Alvarado  Procedure(s) Performed: RADIOACTIVE SEED GUIDED EXCISIONAL LEFT BREAST BIOPSY (Left: Breast)     Patient location during evaluation: PACU Anesthesia Type: General Level of consciousness: awake and alert Pain management: pain level controlled Vital Signs Assessment: post-procedure vital signs reviewed and stable Respiratory status: spontaneous breathing, nonlabored ventilation and respiratory function stable Cardiovascular status: blood pressure returned to baseline Postop Assessment: no apparent nausea or vomiting Anesthetic complications: no   No notable events documented.  Last Vitals:  Vitals:   03/08/23 0830 03/08/23 0845  BP: 122/77 112/69  Pulse: 60 62  Resp: 11 17  Temp:  36.5 C  SpO2: 95% 97%    Last Pain:  Vitals:   03/08/23 0817  TempSrc:   PainSc: 0-No pain                 Shanda Howells

## 2023-03-08 NOTE — Transfer of Care (Signed)
Immediate Anesthesia Transfer of Care Note  Patient: Holly Alvarado  Procedure(s) Performed: RADIOACTIVE SEED GUIDED EXCISIONAL LEFT BREAST BIOPSY (Left: Breast)  Patient Location: PACU  Anesthesia Type:General  Level of Consciousness: awake, alert , and oriented  Airway & Oxygen Therapy: Patient Spontanous Breathing  Post-op Assessment: Report given to RN and Post -op Vital signs reviewed and stable  Post vital signs: Reviewed and stable  Last Vitals:  Vitals Value Taken Time  BP 131/95 03/08/23 0817  Temp 36.5 C 03/08/23 0817  Pulse 65 03/08/23 0823  Resp 11 03/08/23 0823  SpO2 95 % 03/08/23 0823  Vitals shown include unvalidated device data.  Last Pain:  Vitals:   03/08/23 0817  TempSrc:   PainSc: 0-No pain         Complications: No notable events documented.

## 2023-03-08 NOTE — H&P (Signed)
57 year old female who has a history of fatty liver, hypertension, prediabetes who presents after a screening mammogram. She has a prior benign right breast excisional biopsy. She has negative family history. She had a screening mammogram that showed B density breast tissue. Mammogram and ultrasound showed a 5 x 5 x 2 mm lobular mass at 9:00 in the left breast 4 cm from the nipple. This underwent a biopsy. 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 She has no mass or discharge. 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  Review of Systems: A complete review of systems was obtained from the patient. I have reviewed this information and discussed as appropriate with the patient. See HPI as well for other ROS.  Review of Systems  HENT: Positive for congestion.  Respiratory: Positive for sputum production.  Psychiatric/Behavioral: The patient is nervous/anxious.  All other systems reviewed and are negative.  Medical History: Past Medical History:  Diagnosis Date  Anxiety  Esophageal varices (CMS/HHS-HCC)  GERD (gastroesophageal reflux disease)  Hypertension  Liver disease   Past Surgical History:  Procedure Laterality Date  HYSTERECTOMY  LAPAROSCOPIC CHOLECYSTECTOMY  LAPAROSCOPIC GASTRIC BYPASS  MASTECTOMY PARTIAL / LUMPECTOMY  tubligation    Allergies  Allergen Reactions  Adhesive Rash  Adhesive Tape-Silicones Rash  blisters Foam tape - caused blisters and sever itching  Codeine Other (See Comments)  Severe headache.  Sulfa (Sulfonamide Antibiotics) Hives and Rash  Ibuprofen Other (See Comments)  Stomach troubles   Current Outpatient Medications on File Prior to Visit  Medication Sig Dispense Refill  amLODIPine (NORVASC) 2.5 MG tablet Take 1 tablet by mouth once daily  carvediloL (COREG) 6.25 MG tablet TAKE 1 TABLET BY MOUTH 2 TIMES DAILY WITH A MEAL.  pantoprazole (PROTONIX) 40 MG DR tablet TAKE 1 TABLET (40 MG TOTAL)  BY MOUTH TWICE A DAY BEFORE MEALS  SAXENDA 3 mg/0.5 mL (18 mg/3 mL) pen injector INJECT 1.8 MG UNDER THE SKIN ONCE DAILY  venlafaxine (EFFEXOR-XR) 150 MG XR capsule Take 150 mg by mouth daily with breakfast  ergocalciferol, vitamin D2, 1,250 mcg (50,000 unit) capsule TAKE 1 CAPSULE (50,000 UNITS TOTAL) BY MOUTH ONCE A WEEK. ONE CAPSULE ONCE WEEKLY   Family History  Problem Relation Age of Onset  High blood pressure (Hypertension) Mother  Obesity Mother  Hyperlipidemia (Elevated cholesterol) Mother  Stroke Father  Obesity Father  High blood pressure (Hypertension) Father  Hyperlipidemia (Elevated cholesterol) Father  Coronary Artery Disease (Blocked arteries around heart) Father  Diabetes Father  Deep vein thrombosis (DVT or abnormal blood clot formation) Father  Obesity Sister  High blood pressure (Hypertension) Sister  Deep vein thrombosis (DVT or abnormal blood clot formation) Sister  Obesity Brother  High blood pressure (Hypertension) Brother  Hyperlipidemia (Elevated cholesterol) Brother    Social History   Tobacco Use  Smoking Status Never  Smokeless Tobacco Never  Marital status: Married  Tobacco Use  Smoking status: Never  Smokeless tobacco: Never  Substance and Sexual Activity  Alcohol use: Not Currently  Drug use: Never    Objective:   Body mass index is 50.66 kg/m.  Physical Exam Vitals reviewed.  Constitutional:  Appearance: Normal appearance.  Chest:  Breasts: Left: No inverted nipple, mass or nipple discharge.  Lymphadenopathy:  Upper Body:  Left upper body: No supraclavicular or axillary adenopathy.  Neurological:  Mental Status: She is alert.    Assessment and Plan:   Papilloma of left breast  Left breast radioactive seed guided excisional biopsy  Due to the atypia on the core biopsy I recommended excision of this area. We discussed a seed guided excision under anesthesia. Risks and recovery were both discussed. She does have some  chronical medical issues but I think these are all stable and she should be fine for this procedure.

## 2023-03-08 NOTE — Op Note (Signed)
  Preoperative diagnosis: Left breast mass with core biopsy with papilloma and atypia Postoperative diagnosis: Same as above Procedure: Left breast radioactive seed guided excisional biopsy Surgeon: Dr. Harden Mo Anesthesia: General Estimated blood loss: Minimal Complications: None Drains: None Specimens: Left breast tissue containing seed and clip marked with paint sent to pathology Sponge needle count was correct at completion Disposition to recovery stable condition   Indications: 57 year old female who had a screening mammogram that showed B density breast tissue. Mammogram and ultrasound showed a 5 x 5 x 2 mm lobular mass at 9:00 in the left breast 4 cm from the nipple. This underwent a biopsy. 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 We discussed an excisional biopsy.    Procedure: After informed consent was obtained she was taken to the operating room.  She was given antibiotics.  SCDs were placed.  She was placed under general anesthesia without complication.  She was prepped and draped in the standard sterile surgical fashion.  A surgical timeout was then performed.   The seed was in the UIQ. I made a periareolar incision in order to hide the scar later.  I infiltrated Marcaine throughout this area.  I then used the neoprobe to dissect to the seed.   I removed the seed and some of the surrounding tissue.  Mammogram confirmed removal of the seed and the clip.  I then obtained hemostasis.  I closed the breast tissue with 2-0 Vicryl.  The skin was closed with 3-0 Vicryl and 5-0 Monocryl.  Glue and Steri-Strips were applied.  She tolerated well was extubated transferred to recovery stable.

## 2023-03-08 NOTE — Interval H&P Note (Signed)
History and Physical Interval Note:  03/08/2023 7:03 AM  Holly Alvarado  has presented today for surgery, with the diagnosis of LEFT BREAST MASS.  The various methods of treatment have been discussed with the patient and family. After consideration of risks, benefits and other options for treatment, the patient has consented to  Procedure(s): RADIOACTIVE SEED GUIDED EXCISIONAL LEFT BREAST BIOPSY (Left) as a surgical intervention.  The patient's history has been reviewed, patient examined, no change in status, stable for surgery.  I have reviewed the patient's chart and labs.  Questions were answered to the patient's satisfaction.     Emelia Loron

## 2023-03-08 NOTE — Anesthesia Procedure Notes (Addendum)
Procedure Name: Intubation Date/Time: 03/08/2023 7:29 AM  Performed by: Marny Lowenstein, CRNAPre-anesthesia Checklist: Patient identified, Emergency Drugs available, Suction available, Patient being monitored and Timeout performed Patient Re-evaluated:Patient Re-evaluated prior to induction Oxygen Delivery Method: Circle system utilized Preoxygenation: Pre-oxygenation with 100% oxygen Induction Type: IV induction Ventilation: Oral airway inserted - appropriate to patient size Laryngoscope Size: Mac and 3 Grade View: Grade I Tube type: Oral Tube size: 7.0 mm Number of attempts: 1 Airway Equipment and Method: Stylet Placement Confirmation: positive ETCO2, breath sounds checked- equal and bilateral and ETT inserted through vocal cords under direct vision Secured at: 22 cm Tube secured with: Tape Dental Injury: Teeth and Oropharynx as per pre-operative assessment

## 2023-03-08 NOTE — Discharge Instructions (Signed)
[facility] Office Phone Number [phone]  POST OP INSTRUCTIONS Take 400 mg of ibuprofen every 8 hours or 650 mg tylenol every 6 hours for next 72 hours then as needed. Use ice several times daily also.  A prescription for pain medication may be given to you upon discharge.  Take your pain medication as prescribed, if needed.  If narcotic pain medicine is not needed, then you may take acetaminophen (Tylenol), naprosyn (Alleve) or ibuprofen (Advil) as needed. Take your usually prescribed medications unless otherwise directed If you need a refill on your pain medication, please contact your pharmacy.  They will contact our office to request authorization.  Prescriptions will not be filled after 5pm or on week-ends. You should eat very light the first 24 hours after surgery, such as soup, crackers, pudding, etc.  Resume your normal diet the day after surgery. Most patients will experience some swelling and bruising in the breast.  Ice packs and a good support bra will help.  Wear the breast binder provided or a sports bra for 72 hours day and night.  After that wear a sports bra during the day until you return to the office. Swelling and bruising can take several days to resolve.  It is common to experience some constipation if taking pain medication after surgery.  Increasing fluid intake and taking a stool softener will usually help or prevent this problem from occurring.  A mild laxative (Milk of Magnesia or Miralax) should be taken according to package directions if there are no bowel movements after 48 hours. I used skin glue on the incision, you may shower in 24 hours.  The glue will flake off over the next 2-3 weeks.  Any sutures or staples will be removed at the office during your follow-up visit. ACTIVITIES:  You may resume regular daily activities (gradually increasing) beginning the next day.  Wearing a good support bra or sports bra minimizes pain and swelling.  You may have  sexual intercourse when it is comfortable. You may drive when you no longer are taking prescription pain medication, you can comfortably wear a seatbelt, and you can safely maneuver your car and apply brakes. RETURN TO WORK:  ______________________________________________________________________________________ You should see your doctor in the office for a follow-up appointment approximately two weeks after your surgery.  Your doctor's nurse will typically make your follow-up appointment when she calls you with your pathology report.  Expect your pathology report 3-4 business days after your surgery.  You may call to check if you do not hear from us after three days. OTHER INSTRUCTIONS: _______________________________________________________________________________________________ _____________________________________________________________________________________________________________________________________ _____________________________________________________________________________________________________________________________________ _____________________________________________________________________________________________________________________________________  WHEN TO CALL DR Tagen Brethauer: Fever over 101.0 Nausea and/or vomiting. Extreme swelling or bruising. Continued bleeding from incision. Increased pain, redness, or drainage from the incision.  The clinic staff is available to answer your questions during regular business hours.  Please don't hesitate to call and ask to speak to one of the nurses for clinical concerns.  If you have a medical emergency, go to the nearest emergency room or call 911.  A surgeon from [facility] is always on call at the hospital.  For further questions, please visit [email or web address]  

## 2023-03-09 ENCOUNTER — Encounter (HOSPITAL_COMMUNITY): Payer: Self-pay | Admitting: General Surgery

## 2023-03-13 LAB — SURGICAL PATHOLOGY

## 2023-03-16 ENCOUNTER — Ambulatory Visit: Payer: BC Managed Care – PPO | Admitting: Family Medicine

## 2023-03-16 ENCOUNTER — Encounter: Payer: Self-pay | Admitting: Family Medicine

## 2023-03-16 ENCOUNTER — Ambulatory Visit (HOSPITAL_COMMUNITY)
Admission: RE | Admit: 2023-03-16 | Discharge: 2023-03-16 | Disposition: A | Discharge status: Home / Self Care | Payer: BC Managed Care – PPO | Source: Ambulatory Visit | Attending: Family Medicine | Admitting: Family Medicine

## 2023-03-16 VITALS — BP 98/64 | HR 93 | Ht 62.0 in | Wt 283.1 lb

## 2023-03-16 DIAGNOSIS — J329 Chronic sinusitis, unspecified: Secondary | ICD-10-CM

## 2023-03-16 DIAGNOSIS — R7303 Prediabetes: Secondary | ICD-10-CM

## 2023-03-16 DIAGNOSIS — I10 Essential (primary) hypertension: Secondary | ICD-10-CM

## 2023-03-16 DIAGNOSIS — J309 Allergic rhinitis, unspecified: Secondary | ICD-10-CM

## 2023-03-16 DIAGNOSIS — I8510 Secondary esophageal varices without bleeding: Secondary | ICD-10-CM

## 2023-03-16 DIAGNOSIS — E785 Hyperlipidemia, unspecified: Secondary | ICD-10-CM | POA: Diagnosis not present

## 2023-03-16 DIAGNOSIS — I4510 Unspecified right bundle-branch block: Secondary | ICD-10-CM

## 2023-03-16 DIAGNOSIS — F332 Major depressive disorder, recurrent severe without psychotic features: Secondary | ICD-10-CM

## 2023-03-16 DIAGNOSIS — E8881 Metabolic syndrome: Secondary | ICD-10-CM

## 2023-03-16 DIAGNOSIS — J3089 Other allergic rhinitis: Secondary | ICD-10-CM

## 2023-03-16 DIAGNOSIS — F418 Other specified anxiety disorders: Secondary | ICD-10-CM

## 2023-03-16 MED ORDER — FLUTICASONE PROPIONATE 50 MCG/ACT NA SUSP: 2.0000 | Freq: Every day | NASAL | 6 refills | Status: AC

## 2023-03-16 NOTE — Patient Instructions (Addendum)
Follow-up in 6 weeks, call if you need me sooner.  You are referred to cardiology regarding changes in your EKG and recurrent episodes of lightheadedness and near passing out.  You please get an x-ray of your sinuses today and also submit as soon as possible's nasal drainage which you are reporting for the last 4 weeks with no significant improvement.  If there is no evidence of infection I will refer you to an allergist for evaluation and management.  Flonase spray is prescribed for you to use daily to help with allergy symptoms.  Labs needed tomorrow fasting lipid panel and HbA1c.  Sputum/ sinus drainage asap for c/s , pt to submit  Please commit to plant-based eating which means eating primarily vegetables fruit and protein and eat at regular times.  You will be referred for nutrition counseling if you are prediabetic which is his open will be available to you.   Thanks for choosing [facility], we consider it a privelige to serve you.

## 2023-03-18 LAB — LIPID PANEL
Chol/HDL Ratio: 4.5 ratio — ABNORMAL HIGH (ref 0.0–4.4)
Cholesterol, Total: 158 mg/dL (ref 100–199)
HDL: 35 mg/dL — ABNORMAL LOW (ref >39)
LDL Chol Calc (NIH): 100 mg/dL — ABNORMAL HIGH (ref 0–99)
Triglycerides: 127 mg/dL (ref 0–149)
VLDL Cholesterol Cal: 23 mg/dL (ref 5–40)

## 2023-03-18 LAB — HEMOGLOBIN A1C
Est. average glucose Bld gHb Est-mCnc: 137 mg/dL
Hgb A1c MFr Bld: 6.4 % — ABNORMAL HIGH (ref 4.8–5.6)

## 2023-03-20 LAB — GRAM STAIN W/SPUTUM CULT RFLX

## 2023-03-21 DIAGNOSIS — I4510 Unspecified right bundle-branch block: Secondary | ICD-10-CM | POA: Insufficient documentation

## 2023-03-21 DIAGNOSIS — J329 Chronic sinusitis, unspecified: Secondary | ICD-10-CM | POA: Insufficient documentation

## 2023-03-21 DIAGNOSIS — J309 Allergic rhinitis, unspecified: Secondary | ICD-10-CM | POA: Insufficient documentation

## 2023-03-21 NOTE — Assessment & Plan Note (Signed)
Manged by GI, no hematemesis reported

## 2023-03-21 NOTE — Assessment & Plan Note (Signed)
At increased CV risk with new RBB refer cardiology

## 2023-03-21 NOTE — Assessment & Plan Note (Signed)
  Patient re-educated about  the importance of commitment to a  minimum of 150 minutes of exercise per week as able.  The importance of healthy food choices with portion control discussed, as well as eating regularly and within a 12 hour window most days. The need to choose "clean , green" food 50 to 75% of the time is discussed, as well as to make water the primary drink and set a goal of 64 ounces water daily.       03/16/2023    8:55 AM 03/08/2023    6:00 AM 03/04/2023    1:06 PM  Weight /BMI  Weight 283 lb 1.3 oz 282 lb 282 lb 4.8 oz  Height 5\' 2"  (1.575 m) 5\' 2"  (1.575 m) 5\' 2"  (1.575 m)  BMI 51.78 kg/m2 51.58 kg/m2 51.63 kg/m2

## 2023-03-21 NOTE — Assessment & Plan Note (Signed)
4 month h/o chronic pressure and drainage, noinfection clinically and radiographically needs allergy testing

## 2023-03-21 NOTE — Progress Notes (Signed)
Holly Alvarado     MRN: [id number]      DOB: [birth year]  Chief Complaint  Patient presents with   Follow-up    Follow up chest cold recently done 2 e-visits nasal congestion pressure can't hear review recent EKG, requesting nexium    HPI Holly Alvarado is here for follow up and re-evaluation of chronic medical conditions, medication management and review of any available recent lab and radiology data.  Preventive health is updated, specifically  Cancer screening and Immunization.   Recently in te Ed and had abnormal EKG with new changes also reports light headedness, needs Cardiology eval C/o chronic sinus pressure and drainage despite antibiotic treatment C/o challenge of weight loss but persevering  ROS t. Denies chest congestion, productive cough or wheezing. Denies abdominal pain, nausea, vomiting,diarrhea or constipation.   Denies dysuria, frequency, hesitancy or incontinence. Denies joint pain, swelling and limitation in mobility. Denies headaches, seizures, numbness, or tingling. Denies depression, anxiety or insomnia. Denies skin break down or rash.   PE  BP 98/64   Pulse 93   Ht 5\' 2"  (1.575 m)   Wt 283 lb 1.3 oz (128.4 kg)   SpO2 95%   BMI 51.78 kg/m   Patient alert and oriented and in no cardiopulmonary distress.  HEENT: No facial asymmetry, EOMI,     Neck supple .No sinus tenderness, positive  nasal congestion  Chest: Clear to auscultation bilaterally.  CVS: S1, S2 no murmurs, no S3.Regular rate.  ABD: Soft non tender.   Ext: No edema  MS: Adequate ROM spine, shoulders, hips and knees.  Skin: Intact, no ulcerations or rash noted.  Psych: Good eye contact, normal affect. Memory intact not anxious or depressed appearing.  CNS: CN 2-12 intact, power,  normal throughout.no focal deficits noted.   Assessment & Plan  Chronic sinusitis, unspecified Specimen for culture and sinus films if both negtive which I beliebe they will be, needs treatment  for  uncontrolled allergies  Allergic rhinitis Uncontrolled, commit to daily flonase, and refer to Alleergist  Obesity, morbid (HCC)  Patient re-educated about  the importance of commitment to a  minimum of 150 minutes of exercise per week as able.  The importance of healthy food choices with portion control discussed, as well as eating regularly and within a 12 hour window most days. The need to choose "clean , green" food 50 to 75% of the time is discussed, as well as to make water the primary drink and set a goal of 64 ounces water daily.       03/16/2023    8:55 AM 03/08/2023    6:00 AM 03/04/2023    1:06 PM  Weight /BMI  Weight 283 lb 1.3 oz 282 lb 282 lb 4.8 oz  Height 5\' 2"  (1.575 m) 5\' 2"  (1.575 m) 5\' 2"  (1.575 m)  BMI 51.78 kg/m2 51.58 kg/m2 51.63 kg/m2      Prediabetes Patient educated about the importance of limiting  Carbohydrate intake , the need to commit to daily physical activity for a minimum of 30 minutes , and to commit weight loss. The fact that changes in all these areas will reduce or eliminate all together the development of diabetes is stressed.      Latest Ref Rng & Units 03/17/2023    8:55 AM 03/04/2023    2:00 PM 08/18/2022    1:24 PM 08/18/2022   11:42 AM 01/07/2021   10:23 AM  Diabetic Labs  HbA1c 4.8 - 5.6 % 6.4  Chol 100 - 199 mg/dL 161    096  045   HDL >40 mg/dL 35    34  32   Calc LDL 0 - 99 mg/dL 981    52  191   Triglycerides 0 - 149 mg/dL 478    66  295   Creatinine 0.44 - 1.00 mg/dL  6.21  3.08   6.57       03/16/2023    9:28 AM 03/16/2023    8:55 AM 03/08/2023    8:45 AM 03/08/2023    8:30 AM 03/08/2023    8:17 AM 03/08/2023    6:00 AM 03/04/2023    1:06 PM  BP/Weight  Systolic BP 98 96 112 122 131 846 146  Diastolic BP 64 67 69 77 95 72 95  Wt. (Lbs)  283.08    282 282.3  BMI  51.78 kg/m2    51.58 kg/m2 51.63 kg/m2       No data to display            Essential hypertension Controlled, no change in medication DASH diet and  commitment to daily physical activity for a minimum of 30 minutes discussed and encouraged, as a part of hypertension management. The importance of attaining a healthy weight is also discussed.     03/16/2023    9:28 AM 03/16/2023    8:55 AM 03/08/2023    8:45 AM 03/08/2023    8:30 AM 03/08/2023    8:17 AM 03/08/2023    6:00 AM 03/04/2023    1:06 PM  BP/Weight  Systolic BP 98 96 112 122 131 962 146  Diastolic BP 64 67 69 77 95 72 95  Wt. (Lbs)  283.08    282 282.3  BMI  51.78 kg/m2    51.58 kg/m2 51.63 kg/m2       Dyslipidemia Hyperlipidemia:Low fat diet discussed and encouraged.   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 Following  a  heart healthy diet ,commitment to 30 minutes of exercise at least 5 days per week, as well as control of blood sugar and cholesterol , and achieving a healthy weight are all the areas to be addressed .   Esophageal varices (HCC) Manged by GI, no hematemesis reported   Depression with anxiety Controlled, no change in medication

## 2023-03-21 NOTE — Assessment & Plan Note (Signed)
Specimen for culture and sinus films if both negtive which I beliebe they will be, needs treatment  for uncontrolled allergies

## 2023-03-21 NOTE — Assessment & Plan Note (Signed)
Controlled, no change in medication  

## 2023-03-21 NOTE — Assessment & Plan Note (Signed)
The increased risk of cardiovascular disease associated with this diagnosis, and the need to consistently work on lifestyle to change this is discussed. Following  a  heart healthy diet ,commitment to 30 minutes of exercise at least 5 days per week, as well as control of blood sugar and cholesterol , and achieving a healthy weight are all the areas to be addressed .  

## 2023-03-21 NOTE — Assessment & Plan Note (Signed)
Hyperlipidemia:Low fat diet discussed and encouraged.   Lipid Panel  Lab Results  Component Value Date   CHOL 158 03/17/2023   HDL 35 (L) 03/17/2023   LDLCALC 100 (H) 03/17/2023   TRIG 127 03/17/2023   CHOLHDL 4.5 (H) 03/17/2023     Needs to increase exercise and reduce fat intake

## 2023-03-21 NOTE — Assessment & Plan Note (Signed)
Patient educated about the importance of limiting  Carbohydrate intake , the need to commit to daily physical activity for a minimum of 30 minutes , and to commit weight loss. The fact that changes in all these areas will reduce or eliminate all together the development of diabetes is stressed.      Latest Ref Rng & Units 03/17/2023    8:55 AM 03/04/2023    2:00 PM 08/18/2022    1:24 PM 08/18/2022   11:42 AM 01/07/2021   10:23 AM  Diabetic Labs  HbA1c 4.8 - 5.6 % 6.4       Chol 100 - 199 mg/dL 161    096  045   HDL >40 mg/dL 35    34  32   Calc LDL 0 - 99 mg/dL 981    52  191   Triglycerides 0 - 149 mg/dL 478    66  295   Creatinine 0.44 - 1.00 mg/dL  6.21  3.08   6.57       03/16/2023    9:28 AM 03/16/2023    8:55 AM 03/08/2023    8:45 AM 03/08/2023    8:30 AM 03/08/2023    8:17 AM 03/08/2023    6:00 AM 03/04/2023    1:06 PM  BP/Weight  Systolic BP 98 96 112 122 131 846 146  Diastolic BP 64 67 69 77 95 72 95  Wt. (Lbs)  283.08    282 282.3  BMI  51.78 kg/m2    51.58 kg/m2 51.63 kg/m2       No data to display

## 2023-03-21 NOTE — Assessment & Plan Note (Signed)
Uncontrolled, commit to daily flonase, and refer to Alleergist

## 2023-03-21 NOTE — Assessment & Plan Note (Signed)
Controlled, no change in medication DASH diet and commitment to daily physical activity for a minimum of 30 minutes discussed and encouraged, as a part of hypertension management. The importance of attaining a healthy weight is also discussed.     03/16/2023    9:28 AM 03/16/2023    8:55 AM 03/08/2023    8:45 AM 03/08/2023    8:30 AM 03/08/2023    8:17 AM 03/08/2023    6:00 AM 03/04/2023    1:06 PM  BP/Weight  Systolic BP 98 96 112 122 131 161 146  Diastolic BP 64 67 69 77 95 72 95  Wt. (Lbs)  283.08    282 282.3  BMI  51.78 kg/m2    51.58 kg/m2 51.63 kg/m2

## 2023-03-24 ENCOUNTER — Other Ambulatory Visit: Payer: Self-pay | Admitting: Family Medicine

## 2023-03-28 ENCOUNTER — Encounter (INDEPENDENT_AMBULATORY_CARE_PROVIDER_SITE_OTHER): Payer: Self-pay | Admitting: Gastroenterology

## 2023-03-28 ENCOUNTER — Ambulatory Visit (INDEPENDENT_AMBULATORY_CARE_PROVIDER_SITE_OTHER): Payer: BC Managed Care – PPO | Admitting: Gastroenterology

## 2023-03-28 VITALS — BP 120/75 | HR 75 | Temp 97.5°F | Ht 62.0 in | Wt 287.8 lb

## 2023-03-28 DIAGNOSIS — K219 Gastro-esophageal reflux disease without esophagitis: Secondary | ICD-10-CM

## 2023-03-28 DIAGNOSIS — K7460 Unspecified cirrhosis of liver: Secondary | ICD-10-CM | POA: Diagnosis not present

## 2023-03-28 DIAGNOSIS — I8510 Secondary esophageal varices without bleeding: Secondary | ICD-10-CM

## 2023-03-28 DIAGNOSIS — B9681 Helicobacter pylori [H. pylori] as the cause of diseases classified elsewhere: Secondary | ICD-10-CM

## 2023-03-28 DIAGNOSIS — K2970 Gastritis, unspecified, without bleeding: Secondary | ICD-10-CM

## 2023-03-28 NOTE — Progress Notes (Unsigned)
Referring Provider: Kerri Perches, MD Primary Care Physician:  Kerri Perches, MD Primary GI Physician: Levon Hedger   Chief Complaint  Patient presents with   Follow-up    Patient here today for a follow up on cirrhosis, which she not currently having any issue with. She is having issues with reflux. She is taking esomeprazole 20 mg bid and this is not controlling her symptoms.   HPI:   Holly Alvarado is a 57 y.o. 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 Pylori, obesity, Htn, HLD, anxiety, GERD   Patient presenting today for follow up of NASH cirrhosis and GERD   Last seen November 2023, at that time had previously completed quadruple bismuth therapy for H pylori found on EGD in May 2023, reported RUQ pain x2 years. Occasional nausea. Trying to get Saxenda prescribed for weight loss.  Recommended to check MELD labs, INR, increase carvedilol to 12.5mg  BID, Saxenda for weight loss, small meals throughout the day.  Present:   Recent labs with platelet count 196k, AST 89, ALT 54, Alk Phos 140, albumin 3 on 6/7  Patient states she was on Saxenda and was losing weight bu there insurance changed and stopped covering the med so she had to stop it.   She has not had any recent RUQ pain as she was having before. Occasional nausea but felt this was related to a lot of sinus drainage she was having.   Denies confusion or ascites. States PCP changed her carvedilol back to 6.25mg  BID as her BP was running too low on this (notably was 98/64 in June when seen by PCP) patient endorses she was feeling somewhat dizzy and tired with her hypotension. She also notes she was found to have RBBB on recent EKG as well so she is supposed to see cardiology in the near future. She sometimes checks BP at home but does not typically check her heart rate. She has not been doing protein shakes recently but had plans to restart.    She reports more reflux symptoms in  the evenings, previously taking protonix. She switched to generic nexium otc doing 20mg  in the morning and 20mg  at night.   Cirrhosis related questions: Hematemesis/coffee ground emesis: No History of variceal bleeding: No Abdominal pain: No Abdominal distention/worsening ascites: no  Fever/chills: No Episodes of confusion/disorientation: No Taking diuretics?: No Prior history of banding?: No Prior episodes of SBP: No Last time liver imaging was performed: 04/28/2022 US Doppler did not show any portal vein thrombosis, presence of splenomegaly, no masses   Last EGD: 05/21/2022 Grade II varices were found in the lower third of the esophagus. A 1 cm hiatal hernia was present. Evidence of a gastric bypass was found. A gastric pouch with a 7 cm length from the GE junction to the gastrojejunal anastomosis was found. The staple line appeared intact. There was a small polyp at the GJ junction which had mild inflammatory appearance. The gastrojejunal anastomosis was characterized by healthy appearing mucosa. The duodenum-to-jejunum limb was examined. Biopsies were taken with a cold forceps for Helicobacter pylori testing. The examined jejunum was normal - both limbs.   A.   STOMACH, POLYPECTOMY:  -    Mildly active chronic gastritis, with associated Helicobacter  pylori infection (H. pylori immunohistochemical (IHC) stain positive for  organisms, with adequate controls).  -    Focal intestinal metaplasia.  -    Chromogranin IHC stain without ECL cell hyperplasia, with adequate  controls.  -   No vasculopathy identified.   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    Advised to repeat EGD in in 3 years.   Last Colonoscopy:10/2017 Melanosis of the colon, external hemorrhoids Advised to repeat in 5 years   Past Medical History:  Diagnosis Date   Allergic rhinitis, seasonal    Anemia    hx of     Anxiety    hx of panic attacks and panic attacks with surgery    Anxiety disorder    Beta thalassemia trait    Chronic constipation    Complication of anesthesia    woke up during surgery of tubal ligation    Esophageal polyp    Esophageal varices (HCC)    Fatty liver    GERD (gastroesophageal reflux disease)    Hemorrhoids    Hypertension    Obesity     Past Surgical History:  Procedure Laterality Date   BIOPSY  05/21/2022   Procedure: BIOPSY;  Surgeon: Dolores Frame, MD;  [facility];  Service: Gastroenterology;;   BLT  1987   BREAST BIOPSY Right 2010   benign   BREAST BIOPSY Left 10/07/2022   [place] LT BREAST BX W LOC DEV 1ST LESION IMG BX SPEC US GUIDE 10/07/2022 [facility]   BREAST BIOPSY  03/07/2023   MM LT RADIOACTIVE SEED LOC MAMMO GUIDE 03/07/2023 [facility]   BREATH TEK H PYLORI N/A 02/25/2014   Procedure: BREATH TEK H PYLORI;  Surgeon: Valarie Merino, MD;  Location: [facility];  Service: General;  Laterality: N/A;   CHOLECYSTECTOMY  1997   COLONOSCOPY  12/30/2010   COLONOSCOPY N/A 11/23/2017   Procedure: COLONOSCOPY;  Surgeon: Malissa Hippo, MD;  [facility];  Service: Endoscopy;  Laterality: N/A;  1130   ECTOPIC PREGNANCY SURGERY  1994, 1995   ESOPHAGOGASTRODUODENOSCOPY (EGD) WITH PROPOFOL N/A 05/21/2022   Procedure: ESOPHAGOGASTRODUODENOSCOPY (EGD) WITH PROPOFOL;  Surgeon: Dolores Frame, MD;  [facility];  Service: Gastroenterology;  Laterality: N/A;  900 ASA 2   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   Procedure: LAPAROSCOPIC ROUX-EN-Y GASTRIC BYPASS WITH UPPER ENDOSCOPY;  Surgeon: Valarie Merino, MD;  Location: [facility];  Service: General;  Laterality: N/A;   MOUTH SURGERY     wisdom tooth extraction   PARTIAL HYSTERECTOMY     RADIOACTIVE SEED GUIDED EXCISIONAL BREAST BIOPSY Left 03/08/2023   Procedure: RADIOACTIVE SEED GUIDED EXCISIONAL LEFT BREAST BIOPSY;  Surgeon: Emelia Loron, MD;   Location[facility];  Service: General;  Laterality: Left;   TOTAL ABDOMINAL HYSTERECTOMY  03/11/08   for fibroids    TUBAL LIGATION     UPPER GASTROINTESTINAL ENDOSCOPY  12/30/2010   UPPER GASTROINTESTINAL ENDOSCOPY  05/11/04   UPPER GASTROINTESTINAL ENDOSCOPY  04/30/97   Linna Darner    Current Outpatient Medications  Medication Sig Dispense Refill   acetaminophen (TYLENOL) 500 MG tablet Take 500-1,000 mg by mouth every 6 (six) hours as needed for moderate pain.     alum & mag hydroxide-simeth (MAALOX/MYLANTA) 200-200-20 MG/5ML suspension Take 15-30 mLs by mouth every 6 (six) hours as needed for indigestion or heartburn.     amLODipine (NORVASC) 2.5 MG tablet TAKE 1 TABLET BY MOUTH EVERY DAY 90 tablet 3   calcium carbonate (TUMS - DOSED IN MG ELEMENTAL CALCIUM) 500 MG chewable tablet Chew 1 tablet by mouth daily. prn     carvedilol (COREG) 6.25  MG tablet Take 6.25 mg by mouth 2 (two) times daily with a meal.     diphenhydrAMINE (BENADRYL) 25 MG tablet Take 25 mg by mouth daily as needed for itching.     fluticasone (FLONASE) 50 MCG/ACT nasal spray Place 2 sprays into both nostrils daily. 16 g 6   levocetirizine (XYZAL) 5 MG tablet TAKE 1 TABLET BY MOUTH EVERY DAY IN THE EVENING 90 tablet 0   OVER THE COUNTER MEDICATION Staylene Eye drops prn allergies.     polyethylene glycol (MIRALAX / GLYCOLAX) 17 g packet Take 17 g by mouth daily as needed for moderate constipation.     venlafaxine XR (EFFEXOR XR) 150 MG 24 hr capsule Take 1 capsule (150 mg total) by mouth daily with breakfast. 90 capsule 5   Vitamin D, Ergocalciferol, (DRISDOL) 1.25 MG (50000 UNIT) CAPS capsule TAKE 1 CAPSULE (50,000 UNITS TOTAL) BY MOUTH ONCE A WEEK. ONE CAPSULE ONCE WEEKLY 12 capsule 2   No current facility-administered medications for this visit.    Allergies as of 03/28/2023 - Review Complete 03/28/2023  Allergen Reaction Noted   Codeine  07/04/2014   Sulfa antibiotics Hives 08/01/2019   Tape Rash 05/03/2017   Aspirin   03/02/2023   Miconazole Swelling 08/02/2022   Sulfonamide derivatives Hives and Itching 02/20/2008    Family History  Problem Relation Age of Onset   Hypertension Mother    COPD Mother    Depression Mother    Hyperlipidemia Father    Hypertension Father    Depression Father    Alcohol abuse Father    Drug abuse Father    Hyperlipidemia Sister    Hypertension Sister    Hyperlipidemia Sister    Hypertension Sister    Diabetes Other        family history    Asthma Other        family history    Arthritis Other        family history    Stroke Other    Cancer Other    Heart disease Other    Schizophrenia Maternal Uncle    Colon cancer Maternal Aunt    Colon cancer Maternal Aunt    Colon cancer Maternal Aunt    Colon cancer Maternal Aunt    Colon cancer Maternal Aunt    Colon cancer Maternal Aunt     Social History   Socioeconomic History   Marital status: Married    Spouse name: Not on file   Number of children: 2   Years of education: college   Highest education level: Not on file  Occupational History   Occupation: Designer, jewellery and teacher   Tobacco Use   Smoking status: Never    Passive exposure: Never   Smokeless tobacco: Never  Vaping Use   Vaping Use: Never used  Substance and Sexual Activity   Alcohol use: No    Alcohol/week: 0.0 standard drinks of alcohol   Drug use: No   Sexual activity: Yes    Birth control/protection: Surgical  Other Topics Concern   Not on file  Social History Narrative   Not on file   Social Determinants of Health   Financial Resource Strain: Low Risk  (01/06/2022)   Overall Financial Resource Strain (CARDIA)    Difficulty of Paying Living Expenses: Not very hard  Food Insecurity: No Food Insecurity (01/06/2022)   Hunger Vital Sign    Worried About Running Out of Food in the Last Year: Never true    Ran  Out of Food in the Last Year: Never true  Transportation Needs: No Transportation Needs (01/06/2022)   PRAPARE -  Administrator, Civil Service (Medical): No    Lack of Transportation (Non-Medical): No  Physical Activity: Not on file  Stress: Stress Concern Present (01/06/2022)   [employer] of Occupational Health - Occupational Stress Questionnaire    Feeling of Stress : Rather much  Social Connections: Not on file    Review of systems General: negative for malaise, night sweats, fever, chills, weight loss Neck: Negative for lumps, goiter, pain and significant neck swelling Resp: Negative for cough, wheezing, dyspnea at rest CV: Negative for chest pain, leg swelling, palpitations, orthopnea GI: denies melena, hematochezia, nausea, vomiting, diarrhea, constipation, dysphagia, odyonophagia, early satiety or unintentional weight loss. +reflux symptoms  MSK: Negative for joint pain or swelling, back pain, and muscle pain. Derm: Negative for itching or rash Psych: Denies depression, anxiety, memory loss, confusion. No homicidal or suicidal ideation.  Heme: Negative for prolonged bleeding, bruising easily, and swollen nodes. Endocrine: Negative for cold or heat intolerance, polyuria, polydipsia and goiter. Neuro: negative for tremor, gait imbalance, syncope and seizures. The remainder of the review of systems is noncontributory.  Physical Exam: BP 120/75 (BP Location: Left Arm, Patient Position: Sitting, Cuff Size: Normal)   Pulse 75   Temp (!) 97.5 F (36.4 C) (Temporal)   Ht 5\' 2"  (1.575 m)   Wt 287 lb 12.8 oz (130.5 kg)   BMI 52.64 kg/m  General:   Alert and oriented. No distress noted. Pleasant and cooperative.  Head:  Normocephalic and atraumatic. Eyes:  Conjuctiva clear without scleral icterus. Mouth:  Oral mucosa pink and moist. Good dentition. No lesions. Heart: Normal rate and rhythm, s1 and s2 heart sounds present.  Lungs: Clear lung sounds in all lobes. Respirations equal and unlabored. Abdomen:  +BS, soft, non-tender and non-distended. No rebound or guarding. No HSM  or masses noted. Derm: No palmar erythema or jaundice Msk:  Symmetrical without gross deformities. Normal posture. Extremities:  Without edema. Neurologic:  Alert and  oriented x4 Psych:  Alert and cooperative. Normal mood and affect.  Invalid input(s): "6 MONTHS"   ASSESSMENT: RHIA MEAKIN is a 57 y.o. female presenting today for follow up of NASH cirrhosis and GERD  NASH Cirrhosis:  GERD: on Nexium 20mg  BID  H Pylori: biopsy confirmed on EGD in August 2023, treated with Quadruple bismuth therapy, it does not appear she ever had repeat testing to confirm eradication. Will need to hold PPI x2 weeks and complete H pylori breath test to confirm eradication   PLAN:  -RUQ [place]  -Schedule Colonoscopy  -INR, AFP  -continue carvedilol 6.25mg  BID (keep BP/HR log x2 weeks and update [place] with trends)  - continue with nexium 20mg  in the morning, increase to 40mg  in the evening - Reduce salt intake to <2 g per day - Can take Tylenol max of 2 g per day (650 mg q8h) for pain - Avoid NSAIDs for pain - Avoid eating raw oysters/shellfish - Ensure every night before going to sleep -repeat H pylori testing for confirmed eradication (Hold PPI x2 weeks)   All questions were answered, patient verbalized understanding and is in agreement with plan as outlined above.    Follow Up: 6 months   Dillan Candela L. Jeanmarie Hubert, MSN, APRN, AGNP-C Adult-Gerontology Nurse Practitioner [facility]

## 2023-03-28 NOTE — Patient Instructions (Addendum)
-  Schedule [place] of liver -We will repeat some labs in regards to your liver -We will schedule a colonoscopy as you are due for this as well -Continue with carvedilol 6.25mg  twice daily for now, it would be a good idea to try and check your BP and HR once a day and keep a log for 2 weeks, you can let me know how these numbers are running -You can continue with  nexium 20mg  in the morning and increase to 40mg  in the evening, if this is not providing results, please let me know - Reduce salt intake to <2 g per day - Can take Tylenol max of 2 g per day (650 mg q8h) for pain - Avoid NSAIDs for pain - Avoid eating raw oysters/shellfish - Ensure every night before going to sleep

## 2023-03-29 DIAGNOSIS — K2970 Gastritis, unspecified, without bleeding: Secondary | ICD-10-CM | POA: Insufficient documentation

## 2023-03-29 LAB — AFP TUMOR MARKER: AFP, Serum, Tumor Marker: 5.5 ng/mL (ref 0.0–9.2)

## 2023-03-29 LAB — PROTIME-INR
INR: 1.1 (ref 0.9–1.2)
Prothrombin Time: 11.4 s (ref 9.1–12.0)

## 2023-04-08 ENCOUNTER — Ambulatory Visit (HOSPITAL_COMMUNITY)
Admission: RE | Admit: 2023-04-08 | Discharge: 2023-04-08 | Disposition: A | Discharge status: Home / Self Care | Payer: BC Managed Care – PPO | Source: Ambulatory Visit | Attending: Gastroenterology | Admitting: Gastroenterology

## 2023-04-08 DIAGNOSIS — K7460 Unspecified cirrhosis of liver: Secondary | ICD-10-CM | POA: Diagnosis present

## 2023-04-12 ENCOUNTER — Encounter (INDEPENDENT_AMBULATORY_CARE_PROVIDER_SITE_OTHER): Payer: Self-pay

## 2023-04-12 ENCOUNTER — Other Ambulatory Visit (INDEPENDENT_AMBULATORY_CARE_PROVIDER_SITE_OTHER): Payer: Self-pay | Admitting: Gastroenterology

## 2023-04-12 DIAGNOSIS — K838 Other specified diseases of biliary tract: Secondary | ICD-10-CM

## 2023-04-12 DIAGNOSIS — R748 Abnormal levels of other serum enzymes: Secondary | ICD-10-CM

## 2023-04-14 LAB — H. PYLORI BREATH TEST: H pylori Breath Test: NEGATIVE

## 2023-04-27 ENCOUNTER — Ambulatory Visit: Payer: BC Managed Care – PPO | Admitting: Family Medicine

## 2023-04-28 ENCOUNTER — Encounter: Payer: Self-pay | Admitting: Family Medicine

## 2023-05-03 ENCOUNTER — Other Ambulatory Visit (INDEPENDENT_AMBULATORY_CARE_PROVIDER_SITE_OTHER): Payer: Self-pay | Admitting: Gastroenterology

## 2023-05-03 ENCOUNTER — Ambulatory Visit (HOSPITAL_COMMUNITY)
Admission: RE | Admit: 2023-05-03 | Discharge: 2023-05-03 | Disposition: A | Discharge status: Home / Self Care | Payer: BC Managed Care – PPO | Source: Ambulatory Visit | Attending: Gastroenterology | Admitting: Gastroenterology

## 2023-05-03 DIAGNOSIS — R748 Abnormal levels of other serum enzymes: Secondary | ICD-10-CM

## 2023-05-03 DIAGNOSIS — K838 Other specified diseases of biliary tract: Secondary | ICD-10-CM

## 2023-05-03 MED ORDER — GADOBUTROL 1 MMOL/ML IV SOLN
10.0000 mL | Freq: Once | INTRAVENOUS | Status: AC | PRN
  Administered 2023-05-03: 10 mL via INTRAVENOUS

## 2023-05-06 ENCOUNTER — Other Ambulatory Visit: Payer: Self-pay | Admitting: Family Medicine

## 2023-05-06 DIAGNOSIS — J3089 Other allergic rhinitis: Secondary | ICD-10-CM

## 2023-05-06 DIAGNOSIS — R062 Wheezing: Secondary | ICD-10-CM

## 2023-05-10 ENCOUNTER — Telehealth (INDEPENDENT_AMBULATORY_CARE_PROVIDER_SITE_OTHER): Payer: Self-pay | Admitting: *Deleted

## 2023-05-10 NOTE — Telephone Encounter (Signed)
I called pt to discuss results and she asked about her colonscopy. I let her know scheduler would reach out to her soon

## 2023-05-11 NOTE — Telephone Encounter (Signed)
Left message to return call (ASA 3 Castaneda)

## 2023-05-12 ENCOUNTER — Ambulatory Visit: Payer: BC Managed Care – PPO | Attending: Internal Medicine | Admitting: Internal Medicine

## 2023-05-12 ENCOUNTER — Encounter: Payer: Self-pay | Admitting: Internal Medicine

## 2023-05-12 VITALS — BP 130/74 | HR 96 | Wt 294.2 lb

## 2023-05-12 DIAGNOSIS — T671XXA Heat syncope, initial encounter: Secondary | ICD-10-CM

## 2023-05-12 NOTE — Progress Notes (Signed)
Cardiology Office Note  Date: 05/12/2023   ID: Holly Alvarado, Holly Alvarado 1966-05-30, MRN [id number]  PCP:  Kerri Perches, MD  Cardiologist:  None Electrophysiologist:  None   History of Present Illness: Holly Alvarado is a 57 y.o. female known to have beta thalassemia trait, HTN positive to cardiology clinic for dizziness and RBBB.  Patient has dizziness and presyncope episodes only when she steps into the extreme heat.  She gets associated chest pains as well. She stated she had to step into the shade for the dizziness to resolve completely and after that, she slept.  These episodes happen only with heat exposure.  Otherwise, while indoors, she is able to do her activities with no symptoms.  She is able to tolerate mild heat but not extreme heat.  No syncope.  No angina, DOE.  Past Medical History:  Diagnosis Date   Allergic rhinitis, seasonal    Anemia    hx of    Anxiety    hx of panic attacks and panic attacks with surgery    Anxiety disorder    Beta thalassemia trait    Chronic constipation    Complication of anesthesia    woke up during surgery of tubal ligation    Esophageal polyp    Esophageal varices (HCC)    Fatty liver    GERD (gastroesophageal reflux disease)    Hemorrhoids    Hypertension    Obesity     Past Surgical History:  Procedure Laterality Date   BIOPSY  05/21/2022   Procedure: BIOPSY;  Surgeon: Dolores Frame, MD;  [facility];  Service: Gastroenterology;;   BLT  1987   BREAST BIOPSY Right 2010   benign   BREAST BIOPSY Left 10/07/2022   [place] LT BREAST BX W LOC DEV 1ST LESION IMG BX SPEC US GUIDE 10/07/2022 [facility]   BREAST BIOPSY  03/07/2023   MM LT RADIOACTIVE SEED LOC MAMMO GUIDE 03/07/2023 [facility]   BREATH TEK H PYLORI N/A 02/25/2014   Procedure: BREATH TEK H PYLORI;  Surgeon: Valarie Merino, MD;  Location: [facility];  Service: General;  Laterality: N/A;   CHOLECYSTECTOMY  1997   COLONOSCOPY   12/30/2010   COLONOSCOPY N/A 11/23/2017   Procedure: COLONOSCOPY;  Surgeon: Malissa Hippo, MD;  [facility];  Service: Endoscopy;  Laterality: N/A;  1130   ECTOPIC PREGNANCY SURGERY  1994, 1995   ESOPHAGOGASTRODUODENOSCOPY (EGD) WITH PROPOFOL N/A 05/21/2022   Procedure: ESOPHAGOGASTRODUODENOSCOPY (EGD) WITH PROPOFOL;  Surgeon: Dolores Frame, MD;  [facility];  Service: Gastroenterology;  Laterality: N/A;  900 ASA 2   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   Procedure: LAPAROSCOPIC ROUX-EN-Y GASTRIC BYPASS WITH UPPER ENDOSCOPY;  Surgeon: Valarie Merino, MD;  Location: [facility];  Service: General;  Laterality: N/A;   MOUTH SURGERY     wisdom tooth extraction   PARTIAL HYSTERECTOMY     RADIOACTIVE SEED GUIDED EXCISIONAL BREAST BIOPSY Left 03/08/2023   Procedure: RADIOACTIVE SEED GUIDED EXCISIONAL LEFT BREAST BIOPSY;  Surgeon: Emelia Loron, MD;  Location: [facility] OR;  Service: General;  Laterality: Left;   TOTAL ABDOMINAL HYSTERECTOMY  03/11/08   for fibroids    TUBAL LIGATION     UPPER GASTROINTESTINAL ENDOSCOPY  12/30/2010   UPPER GASTROINTESTINAL ENDOSCOPY  05/11/04   UPPER GASTROINTESTINAL ENDOSCOPY  04/30/97   Linna Darner    Current Outpatient Medications  Medication Sig Dispense Refill   acetaminophen (TYLENOL) 500 MG tablet Take 500-1,000 mg  by mouth every 6 (six) hours as needed for moderate pain.     alum & mag hydroxide-simeth (MAALOX/MYLANTA) 200-200-20 MG/5ML suspension Take 15-30 mLs by mouth every 6 (six) hours as needed for indigestion or heartburn.     amLODipine (NORVASC) 2.5 MG tablet TAKE 1 TABLET BY MOUTH EVERY DAY 90 tablet 3   carvedilol (COREG) 6.25 MG tablet Take 6.25 mg by mouth 2 (two) times daily with a meal.     diphenhydrAMINE (BENADRYL) 25 MG tablet Take 25 mg by mouth daily as needed for itching.     fluticasone (FLONASE) 50 MCG/ACT nasal spray Place 2 sprays into both nostrils daily. 16 g 6   levocetirizine (XYZAL) 5 MG tablet TAKE 1  TABLET BY MOUTH EVERY DAY IN THE EVENING 90 tablet 0   OVER THE COUNTER MEDICATION Staylene Eye drops prn allergies.     polyethylene glycol (MIRALAX / GLYCOLAX) 17 g packet Take 17 g by mouth daily as needed for moderate constipation.     venlafaxine XR (EFFEXOR XR) 150 MG 24 hr capsule Take 1 capsule (150 mg total) by mouth daily with breakfast. 90 capsule 5   Vitamin D, Ergocalciferol, (DRISDOL) 1.25 MG (50000 UNIT) CAPS capsule TAKE 1 CAPSULE (50,000 UNITS TOTAL) BY MOUTH ONCE A WEEK. ONE CAPSULE ONCE WEEKLY 12 capsule 2   No current facility-administered medications for this visit.   Allergies:  Codeine, Sulfa antibiotics, Tape, Aspirin, Miconazole, and Sulfonamide derivatives   Social History: The patient  reports that she has never smoked. She has never been exposed to tobacco smoke. She has never used smokeless tobacco. She reports that she does not drink alcohol and does not use drugs.   Family History: The patient's family history includes Alcohol abuse in her father; Arthritis in an other family member; Asthma in an other family member; COPD in her mother; Cancer in an other family member; Colon cancer in her maternal aunt, maternal aunt, maternal aunt, maternal aunt, maternal aunt, and maternal aunt; Depression in her father and mother; Diabetes in an other family member; Drug abuse in her father; Heart disease in an other family member; Hyperlipidemia in her father, sister, and sister; Hypertension in her father, mother, sister, and sister; Schizophrenia in her maternal uncle; Stroke in an other family member.   ROS:  Please see the history of present illness. Otherwise, complete review of systems is positive for none.  All other systems are reviewed and negative.   Physical Exam: VS:  BP 130/74   Pulse 96   Wt 294 lb 3.2 oz (133.4 kg)   SpO2 97%   BMI 53.81 kg/m , BMI Body mass index is 53.81 kg/m.  Wt Readings from Last 3 Encounters:  05/12/23 294 lb 3.2 oz (133.4 kg)   03/28/23 287 lb 12.8 oz (130.5 kg)  03/16/23 283 lb 1.3 oz (128.4 kg)    General: Patient appears comfortable at rest. HEENT: Conjunctiva and lids normal, oropharynx clear with moist mucosa. Neck: Supple, no elevated JVP or carotid bruits, no thyromegaly. Lungs: Clear to auscultation, nonlabored breathing at rest. Cardiac: Regular rate and rhythm, no S3 or significant systolic murmur, no pericardial rub. Abdomen: Soft, nontender, no hepatomegaly, bowel sounds present, no guarding or rebound. Extremities: No pitting edema, distal pulses 2+. Skin: Warm and dry. Musculoskeletal: No kyphosis. Neuropsychiatric: Alert and oriented x3, affect grossly appropriate.  Recent Labwork: 08/18/2022: TSH 0.718 03/04/2023: ALT 54; AST 89; BUN 8; Creatinine, Ser 0.84; Hemoglobin 13.6; Platelets 196; Potassium 3.7; Sodium 139  Component Value Date/Time   CHOL 158 03/17/2023 0855   TRIG 127 03/17/2023 0855   HDL 35 (L) 03/17/2023 0855   CHOLHDL 4.5 (H) 03/17/2023 0855   CHOLHDL 4.8 12/12/2019 0727   VLDL 22 02/01/2017 0706   LDLCALC 100 (H) 03/17/2023 0855   LDLCALC 85 12/12/2019 0727     Assessment and Plan:   Heat syncope: Dizziness and presyncope episodes associated with chest pain occur only during extreme heat. This is benign, avoid extreme heat, strongly recommended p.o. hydration around 1 hour prior to stepping into the sun, wear hat, compression stockings to lower abdomen in the sun.  If she continues to have dizziness/presyncope when not exposed to heat, she will need event monitor at that time.  RBBB, new onset: Benign, no intervention.  HTN, controlled: Continue amlodipine 2.5 mg once daily and carvedilol 6.25 mg twice daily.    Medication Adjustments/Labs and Tests Ordered: Current medicines are reviewed at length with the patient today.  Concerns regarding medicines are outlined above.    Disposition:  Follow up as needed  Signed, Rashard Ryle Verne Spurr, MD, 05/12/2023  8:51 AM    [facility] [address], [place], [place] [address]

## 2023-05-12 NOTE — Patient Instructions (Signed)
Medication Instructions:  Your physician recommends that you continue on your current medications as directed. Please refer to the Current Medication list given to you today.  *If you need a refill on your cardiac medications before your next appointment, please call your pharmacy*   Lab Work: None If you have labs (blood work) drawn today and your tests are completely normal, you will receive your results only by: MyChart Message (if you have MyChart) OR A paper copy in the mail If you have any lab test that is abnormal or we need to change your treatment, we will call you to review the results.   Testing/Procedures: None   Follow-Up: At [facility], you and your health needs are our priority.  As part of our continuing mission to provide you with exceptional heart care, we have created designated Provider Care Teams.  These Care Teams include your primary Cardiologist (physician) and Advanced Practice Providers (APPs -  Physician Assistants and Nurse Practitioners) who all work together to provide you with the care you need, when you need it.  We recommend signing up for the patient portal called "MyChart".  Sign up information is provided on this After Visit Summary.  MyChart is used to connect with patients for Virtual Visits (Telemedicine).  Patients are able to view lab/test results, encounter notes, upcoming appointments, etc.  Non-urgent messages can be sent to your provider as well.   To learn more about what you can do with MyChart, go to [email or web address].    Your next appointment:    Follow up as needed.   Provider:   Luane School, MD    Other Instructions If you have dizzy spells while not outside in the heat, please call our office so that we can ship you a cardiac event monitor.

## 2023-06-01 ENCOUNTER — Other Ambulatory Visit: Payer: Self-pay

## 2023-06-01 ENCOUNTER — Encounter: Payer: Self-pay | Admitting: Allergy & Immunology

## 2023-06-01 ENCOUNTER — Ambulatory Visit: Payer: BC Managed Care – PPO | Admitting: Allergy & Immunology

## 2023-06-01 VITALS — BP 110/80 | HR 68 | Temp 97.2°F | Ht 62.0 in | Wt 296.2 lb

## 2023-06-01 DIAGNOSIS — L503 Dermatographic urticaria: Secondary | ICD-10-CM

## 2023-06-01 DIAGNOSIS — J4520 Mild intermittent asthma, uncomplicated: Secondary | ICD-10-CM | POA: Diagnosis not present

## 2023-06-01 DIAGNOSIS — J3089 Other allergic rhinitis: Secondary | ICD-10-CM | POA: Diagnosis not present

## 2023-06-01 DIAGNOSIS — J302 Other seasonal allergic rhinitis: Secondary | ICD-10-CM

## 2023-06-01 DIAGNOSIS — L209 Atopic dermatitis, unspecified: Secondary | ICD-10-CM

## 2023-06-01 MED ORDER — PEG 3350-KCL-NA BICARB-NACL 420 G PO SOLR: 4000.0000 mL | Freq: Once | ORAL | 0 refills | Status: AC

## 2023-06-01 MED ORDER — LEVOCETIRIZINE DIHYDROCHLORIDE 5 MG PO TABS: 5.0000 mg | ORAL_TABLET | Freq: Every evening | ORAL | 1 refills | Status: DC

## 2023-06-01 MED ORDER — MONTELUKAST SODIUM 10 MG PO TABS: 10.0000 mg | ORAL_TABLET | Freq: Every day | ORAL | 1 refills | Status: DC

## 2023-06-01 MED ORDER — FLUOCINOLONE ACETONIDE BODY 0.01 % EX OIL: TOPICAL_OIL | CUTANEOUS | 5 refills | Status: AC

## 2023-06-01 MED ORDER — RYALTRIS 665-25 MCG/ACT NA SUSP: 2.0000 | Freq: Two times a day (BID) | NASAL | 5 refills | Status: DC | PRN

## 2023-06-01 NOTE — Patient Instructions (Addendum)
1. Atopic dermatitis of scalp - Start fluocinolone oil 3-5 times weekly to the scalp to control inflammation. - Call us if this is not working and we can increase the strength of the steroid.   2. Mild intermittent asthma, uncomplicated - Lung testing not done since your symptoms are under good control. - Continue with albuterol as needed.  - I do not think that you need a controller medication at this time.   3. Chronic rhinitis - Testing today showed: grasses, ragweed, weeds, trees, indoor molds, outdoor molds, dust mites, dog, cockroach, and mixed feathers and horse - Copy of test results provided.  - Avoidance measures provided. - Stop taking: current medications - Start taking: Xyzal (levocetirizine) 5mg  tablet once daily, Singulair (montelukast) 10mg  daily, and Ryaltris (olopatadine/mometasone) two sprays per nostril 1-2 times daily as needed - Singulair can cause irritability and bad dreams, so beware of this.  - You can use an extra dose of the antihistamine, if needed, for breakthrough symptoms.  - Consider nasal saline rinses 1-2 times daily to remove allergens from the nasal cavities as well as help with mucous clearance (this is especially helpful to do before the nasal sprays are given) - Consider allergy shots as a means of long-term control. - Allergy shots "re-train" and "reset" the immune system to ignore environmental allergens and decrease the resulting immune response to those allergens (sneezing, itchy watery eyes, runny nose, nasal congestion, etc).    - Allergy shots improve symptoms in 75-85% of patients.  - CPT codes provided. - We can do rush immunotherapy to get you to maintenance quicker (gets you through three vials in one day).   4. Dermatographism - Hopefully the antihistamines will help with this.  - Around 5-10% of the population has dermatographism like you do.   5. Return in about 2 months (around 08/01/2023). You can have the follow up appointment with  Dr. Dellis Anes or a Nurse Practicioner (our Nurse Practitioners are excellent and always have Physician oversight!).    Please inform us of any Emergency Department visits, hospitalizations, or changes in symptoms. Call us before going to the ED for breathing or allergy symptoms since we might be able to fit you in for a sick visit. Feel free to contact us anytime with any questions, problems, or concerns.  It was a pleasure to meet you today!  Websites that have reliable patient information: 1. American Academy of Asthma, Allergy, and Immunology: www.aaaai.org 2. Food Allergy Research and Education (FARE): foodallergy.org 3. Mothers of Asthmatics: http://www.asthmacommunitynetwork.org 4. American College of Allergy, Asthma, and Immunology: www.acaai.org   COVID-19 Vaccine Information can be found at: [email or web address] For questions related to vaccine distribution or appointments, please email [email or web address] or call [phone].     "Like" [place] on Facebook and Instagram for our latest updates!      A healthy democracy works best when [employer] participate! Make sure you are registered to vote! If you have moved or changed any of your contact information, you will need to get this updated before voting! Scan the QR codes below to learn more!       Airborne Adult Perc - 06/01/23 1534     Time Antigen Placed 1534    Allergen Manufacturer Waynette Buttery    Location Back    Number of Test 55    1. Control-Buffer 50% Glycerol Negative    2. Control-Histamine 2+    3. Bahia 2+    4. [place] 2+    5. Johnson 2+  6. [place] Blue 2+    7. Meadow Fescue 2+    8. Perennial Rye Negative    9. Timothy 2+    10. Ragweed Mix 3+    11. Cocklebur 2+    12. Plantain,  English 2+    13. Baccharis 2+    14. Dog Fennel 3+    15. Russian Thistle 3+    16. Lamb's Quarters 2+    17. Sheep Sorrell 3+    18. Rough Pigweed 2+    19.  Marsh Elder, Rough 2+    20. Mugwort, Common 2+    21. Box, Elder Negative    22. Cedar, red 3+    23. Sweet Gum 3+    24. Pecan Pollen 3+    25. Pine Mix 3+    26. Walnut, Black Pollen 3+    27. Red Mulberry Negative    28. Ash Mix 3+    29. Birch Mix 3+    30. Beech American 3+    31. Cottonwood, Eastern 3+    32. Hickory, White 2+    33. Maple Mix 3+    34. Oak, [place] Mix 2+    35. Sycamore Eastern 2+    36. Alternaria Alternata 3+    37. Cladosporium Herbarum 3+    38. Aspergillus Mix 3+    39. Penicillium Mix Negative    40. Bipolaris Sorokiniana (Helminthosporium) Negative    41. Drechslera Spicifera (Curvularia) 2+    42. Mucor Plumbeus 2+    43. Fusarium Moniliforme 2+    44. Aureobasidium Pullulans (pullulara) 2+    45. Rhizopus Oryzae 2+    46. Botrytis Cinera 3+    47. Epicoccum Nigrum 3+    48. Phoma Betae 3+    49. Dust Mite Mix 3+    50. Cat Hair 10,000 BAU/ml Negative    51.  Dog Epithelia 2+    52. Mixed Feathers 2+    53. Horse Epithelia 2+    54. Cockroach, German 2+    55. Tobacco Leaf Negative             13 Food Perc - 06/01/23 1535       Test Information   Time Antigen Placed 1535    Allergen Manufacturer Waynette Buttery    Location Back    Number of allergen test 13      Food   1. Peanut Negative    2. Soybean Negative    3. Wheat Negative    4. Sesame Negative    5. Milk, Cow Negative    6. Casein Negative    7. Egg White, Chicken Negative    8. Shellfish Mix --   +/-   9. Fish Mix --   +/-   10. Cashew Negative    11. Walnut Food Negative    12. Almond Negative    13. Hazelnut Negative             Reducing Pollen Exposure  The American Academy of Allergy, Asthma and Immunology suggests the following steps to reduce your exposure to pollen during allergy seasons.    Do not hang sheets or clothing out to dry; pollen may collect on these items. Do not mow lawns or spend time around freshly cut grass; mowing stirs up  pollen. Keep windows closed at night.  Keep car windows closed while driving. Minimize morning activities outdoors, a time when pollen counts are usually at their highest. Stay indoors as  much as possible when pollen counts or humidity is high and on windy days when pollen tends to remain in the air longer. Use air conditioning when possible.  Many air conditioners have filters that trap the pollen spores. Use a HEPA room air filter to remove pollen form the indoor air you breathe.  Control of Mold Allergen   Mold and fungi can grow on a variety of surfaces provided certain temperature and moisture conditions exist.  Outdoor molds grow on plants, decaying vegetation and soil.  The major outdoor mold, Alternaria and Cladosporium, are found in very high numbers during hot and dry conditions.  Generally, a late Summer - Fall peak is seen for common outdoor fungal spores.  Rain will temporarily lower outdoor mold spore count, but counts rise rapidly when the rainy period ends.  The most important indoor molds are Aspergillus and Penicillium.  Dark, humid and poorly ventilated basements are ideal sites for mold growth.  The next most common sites of mold growth are the bathroom and the kitchen.  Outdoor (Seasonal) Mold Control   Use air conditioning and keep windows closed Avoid exposure to decaying vegetation. Avoid leaf raking. Avoid grain handling. Consider wearing a face mask if working in moldy areas.    Indoor (Perennial) Mold Control    Maintain humidity below 50%. Clean washable surfaces with 5% bleach solution. Remove sources e.g. contaminated carpets.    Control of Dust Mite Allergen    Dust mites play a major role in allergic asthma and rhinitis.  They occur in environments with high humidity wherever human skin is found.  Dust mites absorb humidity from the atmosphere (ie, they do not drink) and feed on organic matter (including shed human and animal skin).  Dust mites are a  microscopic type of insect that you cannot see with the naked eye.  High levels of dust mites have been detected from mattresses, pillows, carpets, upholstered furniture, bed covers, clothes, soft toys and any woven material.  The principal allergen of the dust mite is found in its feces.  A gram of dust may contain 1,000 mites and 250,000 fecal particles.  Mite antigen is easily measured in the air during house cleaning activities.  Dust mites do not bite and do not cause harm to humans, other than by triggering allergies/asthma.    Ways to decrease your exposure to dust mites in your home:  Encase mattresses, box springs and pillows with a mite-impermeable barrier or cover   Wash sheets, blankets and drapes weekly in hot water (130 F) with detergent and dry them in a dryer on the hot setting.  Have the room cleaned frequently with a vacuum cleaner and a damp dust-mop.  For carpeting or rugs, vacuuming with a vacuum cleaner equipped with a high-efficiency particulate air (HEPA) filter.  The dust mite allergic individual should not be in a room which is being cleaned and should wait 1 hour after cleaning before going into the room. Do not sleep on upholstered furniture (eg, couches).   If possible removing carpeting, upholstered furniture and drapery from the home is ideal.  Horizontal blinds should be eliminated in the rooms where the person spends the most time (bedroom, study, television room).  Washable vinyl, roller-type shades are optimal. Remove all non-washable stuffed toys from the bedroom.  Wash stuffed toys weekly like sheets and blankets above.   Reduce indoor humidity to less than 50%.  Inexpensive humidity monitors can be purchased at most hardware stores.  Do not  use a humidifier as can make the problem worse and are not recommended.  Control of Dog or Cat Allergen  Avoidance is the best way to manage a dog or cat allergy. If you have a dog or cat and are allergic to dog or cats,  consider removing the dog or cat from the home. If you have a dog or cat but don't want to find it a new home, or if your family wants a pet even though someone in the household is allergic, here are some strategies that may help keep symptoms at bay:  Keep the pet out of your bedroom and restrict it to only a few rooms. Be advised that keeping the dog or cat in only one room will not limit the allergens to that room. Don't pet, hug or kiss the dog or cat; if you do, wash your hands with soap and water. High-efficiency particulate air (HEPA) cleaners run continuously in a bedroom or living room can reduce allergen levels over time. Regular use of a high-efficiency vacuum cleaner or a central vacuum can reduce allergen levels. Giving your dog or cat a bath at least once a week can reduce airborne allergen.  Control of Cockroach Allergen  Cockroach allergen has been identified as an important cause of acute attacks of asthma, especially in urban settings.  There are fifty-five species of cockroach that exist in the [place], however only three, the [place], [place] species produce allergen that can affect patients with Asthma.  Allergens can be obtained from fecal particles, egg casings and secretions from cockroaches.    Remove food sources. Reduce access to water. Seal access and entry points. Spray runways with 0.5-1% Diazinon or Chlorpyrifos Blow boric acid power under stoves and refrigerator. Place bait stations (hydramethylnon) at feeding sites.  Allergy Shots  Allergies are the result of a chain reaction that starts in the immune system. Your immune system controls how your body defends itself. For instance, if you have an allergy to pollen, your immune system identifies pollen as an invader or allergen. Your immune system overreacts by producing antibodies called Immunoglobulin E (IgE). These antibodies travel to cells that release chemicals, causing an allergic  reaction.  The concept behind allergy immunotherapy, whether it is received in the form of shots or tablets, is that the immune system can be desensitized to specific allergens that trigger allergy symptoms. Although it requires time and patience, the payback can be long-term relief. Allergy injections contain a dilute solution of those substances that you are allergic to based upon your skin testing and allergy history.   How Do Allergy Shots Work?  Allergy shots work much like a vaccine. Your body responds to injected amounts of a particular allergen given in increasing doses, eventually developing a resistance and tolerance to it. Allergy shots can lead to decreased, minimal or no allergy symptoms.  There generally are two phases: build-up and maintenance. Build-up often ranges from three to six months and involves receiving injections with increasing amounts of the allergens. The shots are typically given once or twice a week, though more rapid build-up schedules are sometimes used.  The maintenance phase begins when the most effective dose is reached. This dose is different for each person, depending on how allergic you are and your response to the build-up injections. Once the maintenance dose is reached, there are longer periods between injections, typically two to four weeks.  Occasionally doctors give cortisone-type shots that can temporarily reduce allergy symptoms. These  types of shots are different and should not be confused with allergy immunotherapy shots.  Who Can Be Treated with Allergy Shots?  Allergy shots may be a good treatment approach for people with allergic rhinitis (hay fever), allergic asthma, conjunctivitis (eye allergy) or stinging insect allergy.   Before deciding to begin allergy shots, you should consider:   The length of allergy season and the severity of your symptoms  Whether medications and/or changes to your environment can control your symptoms  Your desire  to avoid long-term medication use  Time: allergy immunotherapy requires a major time commitment  Cost: may vary depending on your insurance coverage  Allergy shots for children age 56 and older are effective and often well tolerated. They might prevent the onset of new allergen sensitivities or the progression to asthma.  Allergy shots are not started on patients who are pregnant but can be continued on patients who become pregnant while receiving them. In some patients with other medical conditions or who take certain common medications, allergy shots may be of risk. It is important to mention other medications you talk to your allergist.   What are the two types of build-ups offered:   RUSH or Rapid Desensitization -- one day of injections lasting from 8:30-4:30pm, injections every 1 hour.  Approximately half of the build-up process is completed in that one day.  The following week, normal build-up is resumed, and this entails ~16 visits either weekly or twice weekly, until reaching your "maintenance dose" which is continued weekly until eventually getting spaced out to every month for a duration of 3 to 5 years. The regular build-up appointments are nurse visits where the injections are administered, followed by required monitoring for 30 minutes.    Traditional build-up -- weekly visits for 6 -12 months until reaching "maintenance dose", then continue weekly until eventually spacing out to every 4 weeks as above. At these appointments, the injections are administered, followed by required monitoring for 30 minutes.     Either way is acceptable, and both are equally effective. With the rush protocol, the advantage is that less time is spent here for injections overall AND you would also reach maintenance dosing faster (which is when the clinical benefit starts to become more apparent). Not everyone is a candidate for rapid desensitization.   IF we proceed with the RUSH protocol, there are  premedications which must be taken the day before and the day after the rush only (this includes antihistamines, steroids, and Singulair).  After the rush day, no prednisone or Singulair is required, and we just recommend antihistamines taken on your injection day.  What Is An Estimate of the Costs?  If you are interested in starting allergy injections, please check with your insurance company about your coverage for both allergy vial sets and allergy injections.  Please do so prior to making the appointment to start injections.  The following are CPT codes to give to your insurance company. These are the amounts we BILL to the insurance company, but the amount YOU WILL PAY and WE RECEIVE IS SUBSTANTIALLY LESS and depends on the contracts we have with different insurance companies.   Amount Billed to Insurance One allergy vial set  CPT 95165   $ 1200     Two allergy vial set  CPT 95165   $ 2400     Three allergy vial set  CPT 95165   $ 3600     One injection   CPT 95115   $  35  Two injections   CPT 95117   $ 40 RUSH (Rapid Desensitization) CPT 95180 x 8 hours $500/hour  Regarding the allergy injections, your co-pay may or may not apply with each injection, so please confirm this with your insurance company. When you start allergy injections, 1 or 2 sets of vials are made based on your allergies.  Not all patients can be on one set of vials. A set of vials lasts 6 months to a year depending on how quickly you can proceed with your build-up of your allergy injections. Vials are personalized for each patient depending on their specific allergens.  How often are allergy injection given during the build-up period?   Injections are given at least weekly during the build-up period until your maintenance dose is achieved. Per the doctor's discretion, you may have the option of getting allergy injections two times per week during the build-up period. However, there must be at least 48 hours between  injections. The build-up period is usually completed within 6-12 months depending on your ability to schedule injections and for adjustments for reactions. When maintenance dose is reached, your injection schedule is gradually changed to every two weeks and later to every three weeks. Injections will then continue every 4 weeks. Usually, injections are continued for a total of 3-5 years.   When Will I Feel Better?  Some may experience decreased allergy symptoms during the build-up phase. For others, it may take as long as 12 months on the maintenance dose. If there is no improvement after a year of maintenance, your allergist will discuss other treatment options with you.  If you aren't responding to allergy shots, it may be because there is not enough dose of the allergen in your vaccine or there are missing allergens that were not identified during your allergy testing. Other reasons could be that there are high levels of the allergen in your environment or major exposure to non-allergic triggers like tobacco smoke.  What Is the Length of Treatment?  Once the maintenance dose is reached, allergy shots are generally continued for three to five years. The decision to stop should be discussed with your allergist at that time. Some people may experience a permanent reduction of allergy symptoms. Others may relapse and a longer course of allergy shots can be considered.  What Are the Possible Reactions?  The two types of adverse reactions that can occur with allergy shots are local and systemic. Common local reactions include very mild redness and swelling at the injection site, which can happen immediately or several hours after. Report a delayed reaction from your last injection. These include arm swelling or runny nose, watery eyes or cough that occurs within 12-24 hours after injection. A systemic reaction, which is less common, affects the entire body or a particular body system. They are usually  mild and typically respond quickly to medications. Signs include increased allergy symptoms such as sneezing, a stuffy nose or hives.   Rarely, a serious systemic reaction called anaphylaxis can develop. Symptoms include swelling in the throat, wheezing, a feeling of tightness in the chest, nausea or dizziness. Most serious systemic reactions develop within 30 minutes of allergy shots. This is why it is strongly recommended you wait in your doctor's office for 30 minutes after your injections. Your allergist is trained to watch for reactions, and his or her staff is trained and equipped with the proper medications to identify and treat them.   Report to the nurse immediately if  you experience any of the following symptoms: swelling, itching or redness of the skin, hives, watery eyes/nose, breathing difficulty, excessive sneezing, coughing, stomach pain, diarrhea, or light headedness. These symptoms may occur within 15-20 minutes after injection and may require medication.   Who Should Administer Allergy Shots?  The preferred location for receiving shots is your prescribing allergist's office. Injections can sometimes be given at another facility where the physician and staff are trained to recognize and treat reactions, and have received instructions by your prescribing allergist.  What if I am late for an injection?   Injection dose will be adjusted depending upon how many days or weeks you are late for your injection.   What if I am sick?   Please report any illness to the nurse before receiving injections. She may adjust your dose or postpone injections depending on your symptoms. If you have fever, flu, sinus infection or chest congestion it is best to postpone allergy injections until you are better. Never get an allergy injection if your asthma is causing you problems. If your symptoms persist, seek out medical care to get your health problem under control.  What If I am or Become Pregnant:   Women that become pregnant should schedule an appointment with [facility] before receiving any further allergy injections.

## 2023-06-01 NOTE — Telephone Encounter (Signed)
Pt contacted and scheduled for 06/22/23 at 10:45am. Instructions sent via my chart. Prep sent to pharmacy. Will call with pre op

## 2023-06-01 NOTE — Progress Notes (Signed)
NEW PATIENT  Date of Service/Encounter:  06/01/23  Consult requested by: Kerri Perches, MD   Assessment:   Atopic dermatitis of scalp  Mild intermittent asthma, uncomplicated  Dermatographism  Seasonal and perennial allergic rhinitis (grasses, ragweed, weeds, trees, indoor molds, outdoor molds, dust mites, dog, cockroach, and mixed feathers and horse)  School nurse - planning to retire at age 57  Plan/Recommendations:   1. Atopic dermatitis of scalp - Start fluocinolone oil 3-5 times weekly to the scalp to control inflammation. - Call us if this is not working and we can increase the strength of the steroid.   2. Mild intermittent asthma, uncomplicated - Lung testing not done since your symptoms are under good control. - Continue with albuterol as needed.  - I do not think that you need a controller medication at this time.   3. Chronic rhinitis - Testing today showed: grasses, ragweed, weeds, trees, indoor molds, outdoor molds, dust mites, dog, cockroach, and mixed feathers and horse - Copy of test results provided.  - Avoidance measures provided. - Stop taking: current medications - Start taking: Xyzal (levocetirizine) 5mg  tablet once daily, Singulair (montelukast) 10mg  daily, and Ryaltris (olopatadine/mometasone) two sprays per nostril 1-2 times daily as needed - Singulair can cause irritability and bad dreams, so beware of this.  - You can use an extra dose of the antihistamine, if needed, for breakthrough symptoms.  - Consider nasal saline rinses 1-2 times daily to remove allergens from the nasal cavities as well as help with mucous clearance (this is especially helpful to do before the nasal sprays are given) - Consider allergy shots as a means of long-term control. - Allergy shots "re-train" and "reset" the immune system to ignore environmental allergens and decrease the resulting immune response to those allergens (sneezing, itchy watery eyes, runny nose,  nasal congestion, etc).    - Allergy shots improve symptoms in 75-85% of patients.  - CPT codes provided. - We can do rush immunotherapy to get you to maintenance quicker (gets you through three vials in one day).   4. Dermatographism - Hopefully the antihistamines will help with this.  - Around 5-10% of the population has dermatographism like you do.   5. Return in about 2 months (around 08/01/2023). You can have the follow up appointment with Dr. Dellis Anes or a Nurse Practicioner (our Nurse Practitioners are excellent and always have Physician oversight!).   This note in its entirety was forwarded to the Provider who requested this consultation.  Subjective:   Holly Alvarado is a 57 y.o. female presenting today for evaluation of  Chief Complaint  Patient presents with   Establish Care   Allergy Testing   Nasal Congestion    Nasal drip itchy dry eyes   Cough    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   Knee pain, left 09/11/2019   Sleep disorder 09/11/2019   Low back pain 04/19/2019   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/30/2018   History of colonic polyps 09/15/2017   Family hx of colon cancer  09/15/2017   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   Eczema 12/24/2015   Obesity, morbid (HCC) 04/24/2015   Insomnia 04/24/2015   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  Holly Alvarado was referred by Kerri Perches, MD.     Holly Alvarado is a 57 y.o. female presenting for an evaluation of multiple atopic complaints .   Asthma/Respiratory Symptom History: She has an albuterol inhaler that she uses as needed. Her mother has severe asthma. Her mother is on [place].   Allergic Rhinitis Symptom History: She has been on something for allergies for a long time. She has having a lot of symptoms despite this. She has sinus infections four times a year. She needs antibiotics every time with intermittent use of steroids.  She has never seen an ENT doctor. She does report that she has an ear drum that is stopped up on the right side. She has intermittent unpopping and then it gets full of fluid. She is on levocetirizine and Floanse. She takes Benadryl at night.   Food Allergy Symptom History: She denies any food allergies. She has no anaphylaxis symptoms from exposure to any food.   Skin Symptom History: She has hydrocortisone cream to help with itching and a rash. She has very dry skin.  She has itching with any increase in body temperature. She does report that she has dermatographism. She has some itching in her scalp. She has used tar containing products and Nizoral. She has seen a dermatologist in the past.   She grew up in [place] and now lives [place]. 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   Otherwise, there is no history of other atopic diseases, including  food allergies, eczema, or contact dermatitis. There is no significant infectious history. Vaccinations are up to date.    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   Knee pain, left 09/11/2019   Sleep disorder 09/11/2019   Low back pain 04/19/2019   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/30/2018   History of colonic polyps 09/15/2017   Family hx of colon cancer 09/15/2017   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   Eczema 12/24/2015   Obesity, morbid (HCC) 04/24/2015   Insomnia 04/24/2015   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 09/02/2014   Seborrheic dermatitis of scalp 06/13/2014   Lymphocytosis 09/10/2013   Colon polyps,hyperplastic, sigmoid 09/10/2013   Steatosis of liver 09/10/2013   Thalassemia trait, beta 09/10/2013   Metabolic syndrome X 05/21/2013   Elevated LFTs 05/15/2013  Vitamin D deficiency 05/11/2013   Prediabetes 05/10/2013   Depression with anxiety 08/07/2012   Joint pain 07/25/2012   Carotid bruit 10/07/2011   Essential hypertension 06/18/2008   GAD (generalized anxiety disorder) 02/20/2008   Allergic rhinitis 02/20/2008   GERD 02/20/2008    Medication List:  Allergies as of 06/01/2023       Reactions   Codeine    Severe headache.    Sulfa Antibiotics Hives   Tape Rash   Foam tape - caused blisters and severe itching   Aspirin    Due to Gastric Procedure   Miconazole Swelling   Sulfonamide Derivatives Hives, Itching        Medication List        Accurate as of June 01, 2023  5:57 PM. If you have any  questions, ask your nurse or doctor.          acetaminophen 500 MG tablet Commonly known as: TYLENOL Take 500-1,000 mg by mouth every 6 (six) hours as needed for moderate pain.   alum & mag hydroxide-simeth 200-200-20 MG/5ML suspension Commonly known as: MAALOX/MYLANTA Take 15-30 mLs by mouth every 6 (six) hours as needed for indigestion or heartburn.   amLODipine 2.5 MG tablet Commonly known as: NORVASC TAKE 1 TABLET BY MOUTH EVERY DAY   carvedilol 6.25 MG tablet Commonly known as: COREG Take 6.25 mg by mouth 2 (two) times daily with a meal.   diphenhydrAMINE 25 MG tablet Commonly known as: BENADRYL Take 25 mg by mouth daily as needed for itching.   Fluocinolone Acetonide Body 0.01 % Oil Commonly known as: Derma-Smoothe/FS Body Use one application 3-5 times weekly to control scalp inflammation. Started by: Alfonse Spruce   fluticasone 50 MCG/ACT nasal spray Commonly known as: FLONASE Place 2 sprays into both nostrils daily.   levocetirizine 5 MG tablet Commonly known as: XYZAL Take 1 tablet (5 mg total) by mouth every evening. What changed:  how much to take how to take this Changed by: Alfonse Spruce   montelukast 10 MG tablet Commonly known as: Singulair Take 1 tablet (10 mg total) by mouth at bedtime. Started by: Alfonse Spruce   OVER THE COUNTER MEDICATION Staylene Eye drops prn allergies.   polyethylene glycol 17 g packet Commonly known as: MIRALAX / GLYCOLAX Take 17 g by mouth daily as needed for moderate constipation.   polyethylene glycol-electrolytes 420 g solution Commonly known as: NuLYTELY Take 4,000 mLs by mouth once for 1 dose.   Ryaltris 782-95 MCG/ACT Susp Generic drug: Olopatadine-Mometasone Place 2 sprays into the nose 2 (two) times daily as needed. Started by: Alfonse Spruce   venlafaxine XR 150 MG 24 hr capsule Commonly known as: Effexor XR Take 1 capsule (150 mg total) by mouth daily with breakfast.    Vitamin D (Ergocalciferol) 1.25 MG (50000 UNIT) Caps capsule Commonly known as: DRISDOL TAKE 1 CAPSULE (50,000 UNITS TOTAL) BY MOUTH ONCE A WEEK. ONE CAPSULE ONCE WEEKLY        Birth History: non-contributory  Developmental History: non-contributory  Past Surgical History: Past Surgical History:  Procedure Laterality Date   BIOPSY  05/21/2022   Procedure: BIOPSY;  Surgeon: Dolores Frame, MD;  Location: [facility];  Service: Gastroenterology;;   BLT  1987   BREAST BIOPSY Right 2010   benign   BREAST BIOPSY Left 10/07/2022   [place] LT BREAST BX W LOC DEV 1ST LESION IMG BX SPEC US GUIDE 10/07/2022 [facility]   BREAST BIOPSY  03/07/2023   MM LT RADIOACTIVE SEED LOC MAMMO GUIDE 03/07/2023 [facility]   BREATH TEK H PYLORI N/A 02/25/2014   Procedure: BREATH TEK H PYLORI;  Surgeon: Valarie Merino, MD;  Location: [facility];  Service: General;  Laterality: N/A;   CHOLECYSTECTOMY  1997   COLONOSCOPY  12/30/2010   COLONOSCOPY N/A 11/23/2017   Procedure: COLONOSCOPY;  Surgeon: Malissa Hippo, MD;  Location: [facility];  Service: Endoscopy;  Laterality: N/A;  1130   ECTOPIC PREGNANCY SURGERY  1994, 1995   ESOPHAGOGASTRODUODENOSCOPY (EGD) WITH PROPOFOL N/A 05/21/2022   Procedure: ESOPHAGOGASTRODUODENOSCOPY (EGD) WITH PROPOFOL;  Surgeon: Dolores Frame, MD;  Location: [facility];  Service: Gastroenterology;  Laterality: N/A;  900 ASA 2   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   Procedure: LAPAROSCOPIC ROUX-EN-Y GASTRIC BYPASS WITH UPPER ENDOSCOPY;  Surgeon: Valarie Merino, MD;  Location: [facility];  Service: General;  Laterality: N/A;   MOUTH SURGERY     wisdom tooth extraction   PARTIAL HYSTERECTOMY     RADIOACTIVE SEED GUIDED EXCISIONAL BREAST BIOPSY Left 03/08/2023   Procedure: RADIOACTIVE SEED GUIDED EXCISIONAL LEFT BREAST BIOPSY;  Surgeon: Emelia Loron, MD;  [facility];  Service: General;  Laterality: Left;   TOTAL ABDOMINAL HYSTERECTOMY   03/11/08   for fibroids    TUBAL LIGATION     UPPER GASTROINTESTINAL ENDOSCOPY  12/30/2010   UPPER GASTROINTESTINAL ENDOSCOPY  05/11/04   UPPER GASTROINTESTINAL ENDOSCOPY  04/30/97   Linna Darner     Family History: Family History  Problem Relation Age of Onset   Asthma Mother    Angioedema Mother    Hypertension Mother    COPD Mother    Depression Mother    Hyperlipidemia Father    Hypertension Father    Depression Father    Alcohol abuse Father    Drug abuse Father    Hyperlipidemia Sister    Hypertension Sister    Hyperlipidemia Sister    Hypertension Sister    Colon cancer Maternal Aunt    Colon cancer Maternal Aunt    Colon cancer Maternal Aunt    Colon cancer Maternal Aunt    Colon cancer Maternal Aunt    Colon cancer Maternal Aunt    Schizophrenia Maternal Uncle    Diabetes Other        family history    Asthma Other        family history    Arthritis Other        family history    Stroke Other    Cancer Other    Heart disease Other      Social History: Adaley lives at home with her husband. She lives in a house that is 67 years old.  There is wood and tile in the main living areas and tiling in the bedroom.  She has a heat pump for heating and cooling.  There are no dust mite covers on the bedding.  She is not exposed to tobacco.  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  There is no fume, chemical, or dust exposure.  There is no tobacco exposure.   Review of systems otherwise negative other than that mentioned in the HPI.    Objective:   Blood pressure 110/80, pulse 68, temperature (!) 97.2 F (36.2 C), height 5\' 2"  (1.575 m), weight 296 lb 3.2 oz (134.4 kg), SpO2 100%. Body mass index is 54.18 kg/m.     Physical Exam  Vitals reviewed.  Constitutional:      Appearance: She is well-developed.  HENT:     Head: Normocephalic and atraumatic.     Right Ear: Tympanic membrane, ear canal and external ear normal.  No drainage, swelling or tenderness. Tympanic membrane is not injected, scarred, erythematous, retracted or bulging.     Left Ear: Tympanic membrane, ear canal and external ear normal. No drainage, swelling or tenderness. Tympanic membrane is not injected, scarred, erythematous, retracted or bulging.     Nose: No nasal deformity, septal deviation, mucosal edema or rhinorrhea.     Right Turbinates: Enlarged, swollen and pale.     Left Turbinates: Enlarged, swollen and pale.     Right Sinus: No maxillary sinus tenderness or frontal sinus tenderness.     Left Sinus: No maxillary sinus tenderness or frontal sinus tenderness.     Mouth/Throat:     Mouth: Mucous membranes are not pale and not dry.     Pharynx: Uvula midline.  Eyes:     General:        Right eye: No discharge.        Left eye: No discharge.     Conjunctiva/sclera: Conjunctivae normal.     Right eye: Right conjunctiva is not injected. No chemosis.    Left eye: Left conjunctiva is not injected. No chemosis.    Pupils: Pupils are equal, round, and reactive to light.  Cardiovascular:     Rate and Rhythm: Normal rate and regular rhythm.     Heart sounds: Normal heart sounds.  Pulmonary:     Effort: Pulmonary effort is normal. No tachypnea, accessory muscle usage or respiratory distress.     Breath sounds: Normal breath sounds. No wheezing, rhonchi or rales.  Chest:     Chest wall: No tenderness.  Abdominal:     Tenderness: There is no abdominal tenderness. There is no guarding or rebound.  Lymphadenopathy:     Head:     Right side of head: No submandibular, tonsillar or occipital adenopathy.     Left side of head: No submandibular, tonsillar or occipital adenopathy.     Cervical: No cervical adenopathy.  Skin:    Coloration: Skin is not pale.     Findings: No abrasion, erythema, petechiae or rash. Rash is not papular, urticarial or vesicular.  Neurological:     Mental Status: She is alert.  Psychiatric:        Behavior:  Behavior is cooperative.      Diagnostic studies:   Allergy Studies:     Airborne Adult Perc - 06/01/23 1534     Time Antigen Placed 1534    Allergen Manufacturer Waynette Buttery    Location Back    Number of Test 55    1. Control-Buffer 50% Glycerol Negative    2. Control-Histamine 2+    3. Bahia 2+    4. [place] 2+    5. Johnson 2+    6. Kentucky Blue 2+    7. Meadow Fescue 2+    8. Perennial Rye Negative    9. Timothy 2+    10. Ragweed Mix 3+    11. Cocklebur 2+    12. Plantain,  English 2+    13. Baccharis 2+    14. Dog Fennel 3+    15. Russian Thistle 3+    16. Lamb's Quarters 2+    17. Sheep Sorrell 3+    18. Rough Pigweed 2+    19. Michail Jewels Elder, Rough 2+  20. Mugwort, Common 2+    21. Box, Elder Negative    22. Cedar, red 3+    23. Sweet Gum 3+    24. Pecan Pollen 3+    25. Pine Mix 3+    26. Walnut, Black Pollen 3+    27. Red Mulberry Negative    28. Ash Mix 3+    29. Birch Mix 3+    30. Beech American 3+    31. Cottonwood, Eastern 3+    32. Hickory, White 2+    33. Maple Mix 3+    34. Oak, Guinea-[place] Mix 2+    35. Sycamore Eastern 2+    36. Alternaria Alternata 3+    37. Cladosporium Herbarum 3+    38. Aspergillus Mix 3+    39. Penicillium Mix Negative    40. Bipolaris Sorokiniana (Helminthosporium) Negative    41. Drechslera Spicifera (Curvularia) 2+    42. Mucor Plumbeus 2+    43. Fusarium Moniliforme 2+    44. Aureobasidium Pullulans (pullulara) 2+    45. Rhizopus Oryzae 2+    46. Botrytis Cinera 3+    47. Epicoccum Nigrum 3+    48. Phoma Betae 3+    49. Dust Mite Mix 3+    50. Cat Hair 10,000 BAU/ml Negative    51.  Dog Epithelia 2+    52. Mixed Feathers 2+    53. Horse Epithelia 2+    54. Cockroach, German 2+    55. Tobacco Leaf Negative             13 Food Perc - 06/01/23 1535       Test Information   Time Antigen Placed 1535    Allergen Manufacturer Waynette Buttery    Location Back    Number of allergen test 13      Food   1. Peanut  Negative    2. Soybean Negative    3. Wheat Negative    4. Sesame Negative    5. Milk, Cow Negative    6. Casein Negative    7. Egg White, Chicken Negative    8. Shellfish Mix --   +/-   9. Fish Mix --   +/-   10. Cashew Negative    11. Walnut Food Negative    12. Almond Negative    13. Hazelnut Negative             Allergy testing results were read and interpreted by myself, documented by clinical staff.         Malachi Bonds, MD [facility]

## 2023-06-06 ENCOUNTER — Encounter (INDEPENDENT_AMBULATORY_CARE_PROVIDER_SITE_OTHER): Payer: Self-pay

## 2023-06-17 NOTE — Patient Instructions (Signed)
Holly Alvarado  06/17/2023     @PREFPERIOPPHARMACY @   Your procedure is scheduled on  06/22/2023.   Report to [facility] at  0845  A.M.   Call this number if you have problems the morning of surgery:  [phone]  If you experience any cold or flu symptoms such as cough, fever, chills, shortness of breath, etc. between now and your scheduled surgery, please notify us at the above number.   Remember:  Follow the diet and prep instructions given to you by the office.    Take these medicines the morning of surgery with A SIP OF WATER                               amlodipine, carvedilol, effexor.     Do not wear jewelry, make-up or nail polish, including gel polish,  artificial nails, or any other type of covering on natural nails (fingers and  toes).  Do not wear lotions, powders, or perfumes, or deodorant.  Do not shave 48 hours prior to surgery.  Men may shave face and neck.  Do not bring valuables to the hospital.  [facility] is not responsible for any belongings or valuables.  Contacts, dentures or bridgework may not be worn into surgery.  Leave your suitcase in the car.  After surgery it may be brought to your room.  For patients admitted to the hospital, discharge time will be determined by your treatment team.  Patients discharged the day of surgery will not be allowed to drive home and must have someone with them for 24 hours.    Special instructions:   DO NOT smoke tobacco or vape for 24 hours before your procedure.  Please read over the following fact sheets that you were given. Anesthesia Post-op Instructions and Care and Recovery After Surgery      Colonoscopy, Adult, Care After The following information offers guidance on how to care for yourself after your procedure. Your health care provider may also give you more specific instructions. If you have problems or questions, contact your health care provider. What can I expect after the  procedure? After the procedure, it is common to have: A small amount of blood in your stool for 24 hours after the procedure. Some gas. Mild cramping or bloating of your abdomen. Follow these instructions at home: Eating and drinking  Drink enough fluid to keep your urine pale yellow. Follow instructions from your health care provider about eating or drinking restrictions. Resume your normal diet as told by your health care provider. Avoid heavy or fried foods that are hard to digest. Activity Rest as told by your health care provider. Avoid sitting for a long time without moving. Get up to take short walks every 1-2 hours. This is important to improve blood flow and breathing. Ask for help if you feel weak or unsteady. Return to your normal activities as told by your health care provider. Ask your health care provider what activities are safe for you. Managing cramping and bloating  Try walking around when you have cramps or feel bloated. If directed, apply heat to your abdomen as told by your health care provider. Use the heat source that your health care provider recommends, such as a moist heat pack or a heating pad. Place a towel between your skin and the heat source. Leave the heat on for 20-30 minutes. Remove the heat if  your skin turns bright red. This is especially important if you are unable to feel pain, heat, or cold. You have a greater risk of getting burned. General instructions If you were given a sedative during the procedure, it can affect you for several hours. Do not drive or operate machinery until your health care provider says that it is safe. For the first 24 hours after the procedure: Do not sign important documents. Do not drink alcohol. Do your regular daily activities at a slower pace than normal. Eat soft foods that are easy to digest. Take over-the-counter and prescription medicines only as told by your health care provider. Keep all follow-up visits. This  is important. Contact a health care provider if: You have blood in your stool 2-3 days after the procedure. Get help right away if: You have more than a small spotting of blood in your stool. You have large blood clots in your stool. You have swelling of your abdomen. You have nausea or vomiting. You have a fever. You have increasing pain in your abdomen that is not relieved with medicine. These symptoms may be an emergency. Get help right away. Call 911. Do not wait to see if the symptoms will go away. Do not drive yourself to the hospital. Summary After the procedure, it is common to have a small amount of blood in your stool. You may also have mild cramping and bloating of your abdomen. If you were given a sedative during the procedure, it can affect you for several hours. Do not drive or operate machinery until your health care provider says that it is safe. Get help right away if you have a lot of blood in your stool, nausea or vomiting, a fever, or increased pain in your abdomen. This information is not intended to replace advice given to you by your health care provider. Make sure you discuss any questions you have with your health care provider. Document Revised: 10/26/2022 Document Reviewed: 05/06/2021 Elsevier Patient Education  2024 [employer] Monitored Anesthesia Care, Care After The following information offers guidance on how to care for yourself after your procedure. Your health care provider may also give you more specific instructions. If you have problems or questions, contact your health care provider. What can I expect after the procedure? After the procedure, it is common to have: Tiredness. Little or no memory about what happened during or after the procedure. Impaired judgment when it comes to making decisions. Nausea or vomiting. Some trouble with balance. Follow these instructions at home: For the time period you were told by your health care  provider:  Rest. Do not participate in activities where you could fall or become injured. Do not drive or use machinery. Do not drink alcohol. Do not take sleeping pills or medicines that cause drowsiness. Do not make important decisions or sign legal documents. Do not take care of children on your own. Medicines Take over-the-counter and prescription medicines only as told by your health care provider. If you were prescribed antibiotics, take them as told by your health care provider. Do not stop using the antibiotic even if you start to feel better. Eating and drinking Follow instructions from your health care provider about what you may eat and drink. Drink enough fluid to keep your urine pale yellow. If you vomit: Drink clear fluids slowly and in small amounts as you are able. Clear fluids include water, ice chips, low-calorie sports drinks, and fruit juice that has water added to it (diluted  fruit juice). Eat light and bland foods in small amounts as you are able. These foods include bananas, applesauce, rice, lean meats, toast, and crackers. General instructions  Have a responsible adult stay with you for the time you are told. It is important to have someone help care for you until you are awake and alert. If you have sleep apnea, surgery and some medicines can increase your risk for breathing problems. Follow instructions from your health care provider about wearing your sleep device: When you are sleeping. This includes during daytime naps. While taking prescription pain medicines, sleeping medicines, or medicines that make you drowsy. Do not use any products that contain nicotine or tobacco. These products include cigarettes, chewing tobacco, and vaping devices, such as e-cigarettes. If you need help quitting, ask your health care provider. Contact a health care provider if: You feel nauseous or vomit every time you eat or drink. You feel light-headed. You are still sleepy or  having trouble with balance after 24 hours. You get a rash. You have a fever. You have redness or swelling around the IV site. Get help right away if: You have trouble breathing. You have new confusion after you get home. These symptoms may be an emergency. Get help right away. Call 911. Do not wait to see if the symptoms will go away. Do not drive yourself to the hospital. This information is not intended to replace advice given to you by your health care provider. Make sure you discuss any questions you have with your health care provider. Document Revised: 02/08/2022 Document Reviewed: 02/08/2022 Elsevier Patient Education  2024 [employer].

## 2023-06-18 ENCOUNTER — Other Ambulatory Visit: Payer: Self-pay | Admitting: Family Medicine

## 2023-06-20 ENCOUNTER — Encounter (HOSPITAL_COMMUNITY): Payer: Self-pay

## 2023-06-20 ENCOUNTER — Telehealth: Payer: Self-pay | Admitting: *Deleted

## 2023-06-20 ENCOUNTER — Encounter (HOSPITAL_COMMUNITY)
Admission: RE | Admit: 2023-06-20 | Discharge: 2023-06-20 | Disposition: A | Discharge status: Home / Self Care | Payer: BC Managed Care – PPO | Source: Ambulatory Visit | Attending: Gastroenterology | Admitting: Gastroenterology

## 2023-06-20 DIAGNOSIS — K7460 Unspecified cirrhosis of liver: Secondary | ICD-10-CM

## 2023-06-20 NOTE — Pre-Procedure Instructions (Signed)
Office messaged because patient did not show for her pre-op.

## 2023-06-20 NOTE — Telephone Encounter (Signed)
Pt left vm needing to reschedule her procedure.  LMTRC

## 2023-06-22 ENCOUNTER — Ambulatory Visit (HOSPITAL_COMMUNITY)
Admission: RE | Admit: 2023-06-22 | Payer: BC Managed Care – PPO | Source: Home / Self Care | Admitting: Gastroenterology

## 2023-06-22 ENCOUNTER — Encounter (HOSPITAL_COMMUNITY): Admission: RE | Payer: Self-pay | Source: Home / Self Care

## 2023-06-22 SURGERY — COLONOSCOPY WITH PROPOFOL
Anesthesia: Monitor Anesthesia Care

## 2023-06-29 ENCOUNTER — Ambulatory Visit: Payer: BC Managed Care – PPO | Admitting: Family Medicine

## 2023-07-01 ENCOUNTER — Encounter: Payer: Self-pay | Admitting: Family Medicine

## 2023-08-01 ENCOUNTER — Telehealth (INDEPENDENT_AMBULATORY_CARE_PROVIDER_SITE_OTHER): Payer: Self-pay | Admitting: Gastroenterology

## 2023-08-01 NOTE — Telephone Encounter (Signed)
Pt left voicemail in regards to rescheduling TCS from earlier this year. Returned call to patient and she has been scheduled for 08/30/23 at 9:15 AM with Dr.Castaneda. will need pre op-will call pt with pre op appt. Pt has prep already. Instructions sent via my chart. No pa needed via insurance

## 2023-08-03 ENCOUNTER — Encounter (INDEPENDENT_AMBULATORY_CARE_PROVIDER_SITE_OTHER): Payer: Self-pay

## 2023-08-05 ENCOUNTER — Ambulatory Visit: Payer: BC Managed Care – PPO | Admitting: Allergy & Immunology

## 2023-08-12 ENCOUNTER — Ambulatory Visit: Payer: BC Managed Care – PPO | Admitting: Allergy & Immunology

## 2023-08-12 ENCOUNTER — Encounter: Payer: Self-pay | Admitting: Allergy & Immunology

## 2023-08-12 VITALS — BP 114/72 | HR 93 | Temp 97.8°F | Wt 292.4 lb

## 2023-08-12 DIAGNOSIS — J3089 Other allergic rhinitis: Secondary | ICD-10-CM | POA: Diagnosis not present

## 2023-08-12 DIAGNOSIS — J4520 Mild intermittent asthma, uncomplicated: Secondary | ICD-10-CM | POA: Diagnosis not present

## 2023-08-12 DIAGNOSIS — L209 Atopic dermatitis, unspecified: Secondary | ICD-10-CM | POA: Diagnosis not present

## 2023-08-12 DIAGNOSIS — L503 Dermatographic urticaria: Secondary | ICD-10-CM | POA: Diagnosis not present

## 2023-08-12 DIAGNOSIS — J302 Other seasonal allergic rhinitis: Secondary | ICD-10-CM

## 2023-08-12 MED ORDER — FLUTICASONE FUROATE-VILANTEROL 100-25 MCG/ACT IN AEPB: 1.0000 | INHALATION_SPRAY | Freq: Every day | RESPIRATORY_TRACT | 5 refills | Status: AC

## 2023-08-12 NOTE — Patient Instructions (Addendum)
1. Atopic dermatitis of scalp - Start fluocinolone oil 3-5 times weekly to the scalp to control inflammation. - Call us if this is not working and we can increase the strength of the steroid.   2. Mild intermittent asthma, uncomplicated - Lung testing looks good today. - We are going to start Breo one puff daily (contains a long acting albuterol combined with an inhaled steroid).  - Call us if this is too expensive.   3. Chronic rhinitis - Testing today showed: grasses, ragweed, weeds, trees, indoor molds, outdoor molds, dust mites, dog, cockroach, and mixed feathers and horse - Start taking: Xyzal (levocetirizine) 5mg  tablet once daily, Singulair (montelukast) 10mg  daily, and Ryaltris (olopatadine/mometasone) two sprays per nostril 1-2 times daily as needed - Singulair can cause irritability and bad dreams, so beware of this.  - You can use an extra dose of the antihistamine, if needed, for breakthrough symptoms.  - Consider nasal saline rinses 1-2 times daily to remove allergens from the nasal cavities as well as help with mucous clearance (this is especially helpful to do before the nasal sprays are given) - Consider allergy shots as a means of long-term control. - Allergy shots "re-train" and "reset" the immune system to ignore environmental allergens and decrease the resulting immune response to those allergens (sneezing, itchy watery eyes, runny nose, nasal congestion, etc).    - Allergy shots improve symptoms in 75-85% of patients.  - CPT codes provided. - We can do rush immunotherapy to get you to maintenance quicker (gets you through three vials in one day).   4. Dermatographism - Hopefully the antihistamines will help with this.  - Around 5-10% of the population has dermatographism like you do.  5. No follow-ups on file. You can have the follow up appointment with Dr. Dellis Anes or a Nurse Practicioner (our Nurse Practitioners are excellent and always have Physician oversight!).     Please inform us of any Emergency Department visits, hospitalizations, or changes in symptoms. Call us before going to the ED for breathing or allergy symptoms since we might be able to fit you in for a sick visit. Feel free to contact us anytime with any questions, problems, or concerns.  It was a pleasure to see you again today!  Websites that have reliable patient information: 1. American Academy of Asthma, Allergy, and Immunology: www.aaaai.org 2. Food Allergy Research and Education (FARE): foodallergy.org 3. Mothers of Asthmatics: http://www.asthmacommunitynetwork.org 4. American College of Allergy, Asthma, and Immunology: www.acaai.org      "Like" [place] on Facebook and Instagram for our latest updates!      A healthy democracy works best when [employer] participate! Make sure you are registered to vote! If you have moved or changed any of your contact information, you will need to get this updated before voting! Scan the QR codes below to learn more!     Allergy Shots  Allergies are the result of a chain reaction that starts in the immune system. Your immune system controls how your body defends itself. For instance, if you have an allergy to pollen, your immune system identifies pollen as an invader or allergen. Your immune system overreacts by producing antibodies called Immunoglobulin E (IgE). These antibodies travel to cells that release chemicals, causing an allergic reaction.  The concept behind allergy immunotherapy, whether it is received in the form of shots or tablets, is that the immune system can be desensitized to specific allergens that trigger allergy symptoms. Although it requires time and patience, the  payback can be long-term relief. Allergy injections contain a dilute solution of those substances that you are allergic to based upon your skin testing and allergy history.   How Do Allergy Shots Work?  Allergy shots work much like a vaccine. Your body responds  to injected amounts of a particular allergen given in increasing doses, eventually developing a resistance and tolerance to it. Allergy shots can lead to decreased, minimal or no allergy symptoms.  There generally are two phases: build-up and maintenance. Build-up often ranges from three to six months and involves receiving injections with increasing amounts of the allergens. The shots are typically given once or twice a week, though more rapid build-up schedules are sometimes used.  The maintenance phase begins when the most effective dose is reached. This dose is different for each person, depending on how allergic you are and your response to the build-up injections. Once the maintenance dose is reached, there are longer periods between injections, typically two to four weeks.  Occasionally doctors give cortisone-type shots that can temporarily reduce allergy symptoms. These types of shots are different and should not be confused with allergy immunotherapy shots.  Who Can Be Treated with Allergy Shots?  Allergy shots may be a good treatment approach for people with allergic rhinitis (hay fever), allergic asthma, conjunctivitis (eye allergy) or stinging insect allergy.   Before deciding to begin allergy shots, you should consider:   The length of allergy season and the severity of your symptoms  Whether medications and/or changes to your environment can control your symptoms  Your desire to avoid long-term medication use  Time: allergy immunotherapy requires a major time commitment  Cost: may vary depending on your insurance coverage  Allergy shots for children age 53 and older are effective and often well tolerated. They might prevent the onset of new allergen sensitivities or the progression to asthma.  Allergy shots are not started on patients who are pregnant but can be continued on patients who become pregnant while receiving them. In some patients with other medical conditions or who  take certain common medications, allergy shots may be of risk. It is important to mention other medications you talk to your allergist.   What are the two types of build-ups offered:   RUSH or Rapid Desensitization -- one day of injections lasting from 8:30-4:30pm, injections every 1 hour.  Approximately half of the build-up process is completed in that one day.  The following week, normal build-up is resumed, and this entails ~16 visits either weekly or twice weekly, until reaching your "maintenance dose" which is continued weekly until eventually getting spaced out to every month for a duration of 3 to 5 years. The regular build-up appointments are nurse visits where the injections are administered, followed by required monitoring for 30 minutes.    Traditional build-up -- weekly visits for 6 -12 months until reaching "maintenance dose", then continue weekly until eventually spacing out to every 4 weeks as above. At these appointments, the injections are administered, followed by required monitoring for 30 minutes.     Either way is acceptable, and both are equally effective. With the rush protocol, the advantage is that less time is spent here for injections overall AND you would also reach maintenance dosing faster (which is when the clinical benefit starts to become more apparent). Not everyone is a candidate for rapid desensitization.   IF we proceed with the RUSH protocol, there are premedications which must be taken the day before and the day  after the rush only (this includes antihistamines, steroids, and Singulair).  After the rush day, no prednisone or Singulair is required, and we just recommend antihistamines taken on your injection day.  What Is An Estimate of the Costs?  If you are interested in starting allergy injections, please check with your insurance company about your coverage for both allergy vial sets and allergy injections.  Please do so prior to making the appointment to  start injections.  The following are CPT codes to give to your insurance company. These are the amounts we BILL to the insurance company, but the amount YOU WILL PAY and WE RECEIVE IS SUBSTANTIALLY LESS and depends on the contracts we have with different insurance companies.   Amount Billed to Insurance Two allergy vial set   CPT 95165   $ 2400      Two injections    CPT 95117   $ 40  RUSH (Rapid Desensitization) CPT 95180 x 6 hours  $500/hour  Regarding the allergy injections, your co-pay may or may not apply with each injection, so please confirm this with your insurance company. When you start allergy injections, 1 or 2 sets of vials are made based on your allergies.  Not all patients can be on one set of vials. A set of vials lasts 6 months to a year depending on how quickly you can proceed with your build-up of your allergy injections. Vials are personalized for each patient depending on their specific allergens.  How often are allergy injection given during the build-up period?   Injections are given at least weekly during the build-up period until your maintenance dose is achieved. Per the doctor's discretion, you may have the option of getting allergy injections two times per week during the build-up period. However, there must be at least 48 hours between injections. The build-up period is usually completed within 6-12 months depending on your ability to schedule injections and for adjustments for reactions. When maintenance dose is reached, your injection schedule is gradually changed to every two weeks and later to every three weeks. Injections will then continue every 4 weeks. Usually, injections are continued for a total of 3-5 years.   When Will I Feel Better?  Some may experience decreased allergy symptoms during the build-up phase. For others, it may take as long as 12 months on the maintenance dose. If there is no improvement after a year of maintenance, your allergist will discuss  other treatment options with you.  If you aren't responding to allergy shots, it may be because there is not enough dose of the allergen in your vaccine or there are missing allergens that were not identified during your allergy testing. Other reasons could be that there are high levels of the allergen in your environment or major exposure to non-allergic triggers like tobacco smoke.  What Is the Length of Treatment?  Once the maintenance dose is reached, allergy shots are generally continued for three to five years. The decision to stop should be discussed with your allergist at that time. Some people may experience a permanent reduction of allergy symptoms. Others may relapse and a longer course of allergy shots can be considered.  What Are the Possible Reactions?  The two types of adverse reactions that can occur with allergy shots are local and systemic. Common local reactions include very mild redness and swelling at the injection site, which can happen immediately or several hours after. Report a delayed reaction from your last injection. These include arm  swelling or runny nose, watery eyes or cough that occurs within 12-24 hours after injection. A systemic reaction, which is less common, affects the entire body or a particular body system. They are usually mild and typically respond quickly to medications. Signs include increased allergy symptoms such as sneezing, a stuffy nose or hives.   Rarely, a serious systemic reaction called anaphylaxis can develop. Symptoms include swelling in the throat, wheezing, a feeling of tightness in the chest, nausea or dizziness. Most serious systemic reactions develop within 30 minutes of allergy shots. This is why it is strongly recommended you wait in your doctor's office for 30 minutes after your injections. Your allergist is trained to watch for reactions, and his or her staff is trained and equipped with the proper medications to identify and treat them.    Report to the nurse immediately if you experience any of the following symptoms: swelling, itching or redness of the skin, hives, watery eyes/nose, breathing difficulty, excessive sneezing, coughing, stomach pain, diarrhea, or light headedness. These symptoms may occur within 15-20 minutes after injection and may require medication.   Who Should Administer Allergy Shots?  The preferred location for receiving shots is your prescribing allergist's office. Injections can sometimes be given at another facility where the physician and staff are trained to recognize and treat reactions, and have received instructions by your prescribing allergist.  What if I am late for an injection?   Injection dose will be adjusted depending upon how many days or weeks you are late for your injection.   What if I am sick?   Please report any illness to the nurse before receiving injections. She may adjust your dose or postpone injections depending on your symptoms. If you have fever, flu, sinus infection or chest congestion it is best to postpone allergy injections until you are better. Never get an allergy injection if your asthma is causing you problems. If your symptoms persist, seek out medical care to get your health problem under control.  What If I am or Become Pregnant:  Women that become pregnant should schedule an appointment with [facility] before receiving any further allergy injections.

## 2023-08-12 NOTE — Progress Notes (Unsigned)
FOLLOW UP  Date of Service/Encounter:  08/12/23   Assessment:   Atopic dermatitis of scalp   Mild intermittent asthma, uncomplicated   Dermatographism   Seasonal and perennial allergic rhinitis (grasses, ragweed, weeds, trees, indoor molds, outdoor molds, dust mites, dog, cockroach, and mixed feathers and horse)   School nurse - planning to retire at age 57  Plan/Recommendations:   1. Atopic dermatitis of scalp - Start fluocinolone oil 3-5 times weekly to the scalp to control inflammation. - Call us if this is not working and we can increase the strength of the steroid.   2. Mild intermittent asthma, uncomplicated - Lung testing looks good today. - We are going to start Breo one puff daily (contains a long acting albuterol combined with an inhaled steroid).  - Call us if this is too expensive.   3. Chronic rhinitis - Testing today showed: grasses, ragweed, weeds, trees, indoor molds, outdoor molds, dust mites, dog, cockroach, and mixed feathers and horse - Start taking: Xyzal (levocetirizine) 5mg  tablet once daily, Singulair (montelukast) 10mg  daily, and Ryaltris (olopatadine/mometasone) two sprays per nostril 1-2 times daily as needed - Singulair can cause irritability and bad dreams, so beware of this.  - You can use an extra dose of the antihistamine, if needed, for breakthrough symptoms.  - Consider nasal saline rinses 1-2 times daily to remove allergens from the nasal cavities as well as help with mucous clearance (this is especially helpful to do before the nasal sprays are given) - Consider allergy shots as a means of long-term control. - Allergy shots "re-train" and "reset" the immune system to ignore environmental allergens and decrease the resulting immune response to those allergens (sneezing, itchy watery eyes, runny nose, nasal congestion, etc).    - Allergy shots improve symptoms in 75-85% of patients.  - CPT codes provided. - We can do rush immunotherapy to  get you to maintenance quicker (gets you through three vials in one day).   4. Dermatographism - Hopefully the antihistamines will help with this.  - Around 5-10% of the population has dermatographism like you do.  5. Follow up in 6 months or earlier if needed.   Subjective:   Holly Alvarado is a 57 y.o. female presenting today for follow up of  Chief Complaint  Patient presents with   Follow-up    2 mo f/u No concerns  Does not want to do allergy shots at this time.    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   Knee pain, left 09/11/2019   Sleep disorder 09/11/2019   Low back pain 04/19/2019   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/30/2018   History of colonic polyps 09/15/2017   Family hx of colon cancer 09/15/2017   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   Eczema 12/24/2015   Obesity, morbid (HCC) 04/24/2015   Insomnia 04/24/2015   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  Discussed the use of AI scribe software for clinical note transcription with the patient and/or guardian, who gave verbal consent to proceed.  Holly Alvarado is a 57 y.o. female presenting for a follow up visit. She was last seen in September 2024.  At that time, we started fluocinolone oil for atopic dermatitis of her scalp.  For her asthma, we did not do lung testing.  Albuterol as needed with working to control her symptoms.  She had testing that was positive to multiple indoor and outdoor allergens.  We stopped all of her medicines and started Xyzal as well as Singulair and Ryaltris.  Since last visit, she has done well.   Discussed the use of AI scribe software for clinical note transcription with the patient, who gave verbal consent to proceed.  History of Present Illness            {Blank single:19197::"Asthma/Respiratory Symptom History: ***"," "}  {Blank single:19197::"Allergic Rhinitis Symptom History: ***"," "}  {Blank single:19197::"Food Allergy Symptom History: ***"," "}  {Blank single:19197::"Skin Symptom History: ***"," "}  {Blank single:19197::"GERD Symptom History: ***"," "}  Otherwise, there have been no changes to her past medical history, surgical history, family history, or social history.    Review of systems otherwise negative other than that mentioned in the HPI.    Objective:   Blood pressure 114/72, pulse 93, temperature 97.8 F (36.6 C), weight 292 lb 6 oz (132.6 kg), SpO2 97%. Body mass index is 53.48 kg/m.    Physical Exam   Diagnostic studies:    Spirometry: results normal (FEV1: 1.87/90%, FVC: 2.18/84%, FEV1/FVC: 86%).    Spirometry consistent with normal pattern. {Blank single:19197::"Albuterol/Atrovent nebulizer","Xopenex/Atrovent nebulizer","Albuterol nebulizer","Albuterol four puffs via MDI","Xopenex four puffs via MDI"} treatment given in clinic with {Blank single:19197::"significant  improvement in FEV1 per ATS criteria","significant improvement in FVC per ATS criteria","significant improvement in FEV1 and FVC per ATS criteria","improvement in FEV1, but not significant per ATS criteria","improvement in FVC, but not significant per ATS criteria","improvement in FEV1 and FVC, but not significant per ATS criteria","no improvement"}.  Allergy Studies: {Blank single:19197::"none","deferred due to recent antihistamine use","deferred due to insurance stipulations that require a separate visit for testing","labs sent instead"," "}    {Blank single:19197::"Allergy testing results were read and interpreted by myself, documented by clinical staff."," "}      Malachi Bonds, MD  [facility]

## 2023-08-13 ENCOUNTER — Encounter: Payer: Self-pay | Admitting: Allergy & Immunology

## 2023-08-15 NOTE — Addendum Note (Signed)
Addended by: Elsworth Soho on: 08/15/2023 04:26 PM   Modules accepted: Orders

## 2023-08-19 NOTE — Patient Instructions (Addendum)
Holly Alvarado  08/19/2023     @PREFPERIOPPHARMACY @   Your procedure is scheduled on  08/30/2023.   Report to [facility] at  0715  A.M.   Call this number if you have problems the morning of surgery:  [phone]  If you experience any cold or flu symptoms such as cough, fever, chills, shortness of breath, etc. between now and your scheduled surgery, please notify us at the above number.   Remember:  Follow the diet and prep instructions given to you by the office.    You may drink clear liquids until 0515 am on 08/30/2023.    Clear liquids allowed are:                    Water, Juice (No red color; non-citric and without pulp; diabetics please choose diet or no sugar options), Carbonated beverages (diabetics please choose diet or no sugar options), Clear Tea (No creamer, milk, or cream, including half & half and powdered creamer), Black Coffee Only (No creamer, milk or cream, including half & half and powdered creamer), and Clear Sports drink (No red color; diabetics please choose diet or no sugar options)    Take these medicines the morning of surgery with A SIP OF WATER                       amlodipine, carvedilol,venlafaxine.     Do not wear jewelry, make-up or nail polish, including gel polish,  artificial nails, or any other type of covering on natural nails (fingers and  toes).  Do not wear lotions, powders, or perfumes, or deodorant.  Do not shave 48 hours prior to surgery.  Men may shave face and neck.  Do not bring valuables to the hospital.  [facility] is not responsible for any belongings or valuables.  Contacts, dentures or bridgework may not be worn into surgery.  Leave your suitcase in the car.  After surgery it may be brought to your room.  For patients admitted to the hospital, discharge time will be determined by your treatment team.  Patients discharged the day of surgery will not be allowed to drive home and must have someone with them for 24 hours.     Special instructions:   DO NOT smoke tobacco or vape for 24 hours before your procedure.  Please read over the following fact sheets that you were given. Anesthesia Post-op Instructions and Care and Recovery After Surgery         Colonoscopy, Adult, Care After The following information offers guidance on how to care for yourself after your procedure. Your health care provider may also give you more specific instructions. If you have problems or questions, contact your health care provider. What can I expect after the procedure? After the procedure, it is common to have: A small amount of blood in your stool for 24 hours after the procedure. Some gas. Mild cramping or bloating of your abdomen. Follow these instructions at home: Eating and drinking  Drink enough fluid to keep your urine pale yellow. Follow instructions from your health care provider about eating or drinking restrictions. Resume your normal diet as told by your health care provider. Avoid heavy or fried foods that are hard to digest. Activity Rest as told by your health care provider. Avoid sitting for a long time without moving. Get up to take short walks every 1-2 hours. This is important to improve blood flow and breathing. Ask  for help if you feel weak or unsteady. Return to your normal activities as told by your health care provider. Ask your health care provider what activities are safe for you. Managing cramping and bloating  Try walking around when you have cramps or feel bloated. If directed, apply heat to your abdomen as told by your health care provider. Use the heat source that your health care provider recommends, such as a moist heat pack or a heating pad. Place a towel between your skin and the heat source. Leave the heat on for 20-30 minutes. Remove the heat if your skin turns bright red. This is especially important if you are unable to feel pain, heat, or cold. You have a greater risk of getting  burned. General instructions If you were given a sedative during the procedure, it can affect you for several hours. Do not drive or operate machinery until your health care provider says that it is safe. For the first 24 hours after the procedure: Do not sign important documents. Do not drink alcohol. Do your regular daily activities at a slower pace than normal. Eat soft foods that are easy to digest. Take over-the-counter and prescription medicines only as told by your health care provider. Keep all follow-up visits. This is important. Contact a health care provider if: You have blood in your stool 2-3 days after the procedure. Get help right away if: You have more than a small spotting of blood in your stool. You have large blood clots in your stool. You have swelling of your abdomen. You have nausea or vomiting. You have a fever. You have increasing pain in your abdomen that is not relieved with medicine. These symptoms may be an emergency. Get help right away. Call 911. Do not wait to see if the symptoms will go away. Do not drive yourself to the hospital. Summary After the procedure, it is common to have a small amount of blood in your stool. You may also have mild cramping and bloating of your abdomen. If you were given a sedative during the procedure, it can affect you for several hours. Do not drive or operate machinery until your health care provider says that it is safe. Get help right away if you have a lot of blood in your stool, nausea or vomiting, a fever, or increased pain in your abdomen. This information is not intended to replace advice given to you by your health care provider. Make sure you discuss any questions you have with your health care provider. Document Revised: 10/26/2022 Document Reviewed: 05/06/2021 Elsevier Patient Education  2024 [employer] Monitored Anesthesia Care, Care After The following information offers guidance on how to care for yourself  after your procedure. Your health care provider may also give you more specific instructions. If you have problems or questions, contact your health care provider. What can I expect after the procedure? After the procedure, it is common to have: Tiredness. Little or no memory about what happened during or after the procedure. Impaired judgment when it comes to making decisions. Nausea or vomiting. Some trouble with balance. Follow these instructions at home: For the time period you were told by your health care provider:  Rest. Do not participate in activities where you could fall or become injured. Do not drive or use machinery. Do not drink alcohol. Do not take sleeping pills or medicines that cause drowsiness. Do not make important decisions or sign legal documents. Do not take care of children on your own.  Medicines Take over-the-counter and prescription medicines only as told by your health care provider. If you were prescribed antibiotics, take them as told by your health care provider. Do not stop using the antibiotic even if you start to feel better. Eating and drinking Follow instructions from your health care provider about what you may eat and drink. Drink enough fluid to keep your urine pale yellow. If you vomit: Drink clear fluids slowly and in small amounts as you are able. Clear fluids include water, ice chips, low-calorie sports drinks, and fruit juice that has water added to it (diluted fruit juice). Eat light and bland foods in small amounts as you are able. These foods include bananas, applesauce, rice, lean meats, toast, and crackers. General instructions  Have a responsible adult stay with you for the time you are told. It is important to have someone help care for you until you are awake and alert. If you have sleep apnea, surgery and some medicines can increase your risk for breathing problems. Follow instructions from your health care provider about wearing your  sleep device: When you are sleeping. This includes during daytime naps. While taking prescription pain medicines, sleeping medicines, or medicines that make you drowsy. Do not use any products that contain nicotine or tobacco. These products include cigarettes, chewing tobacco, and vaping devices, such as e-cigarettes. If you need help quitting, ask your health care provider. Contact a health care provider if: You feel nauseous or vomit every time you eat or drink. You feel light-headed. You are still sleepy or having trouble with balance after 24 hours. You get a rash. You have a fever. You have redness or swelling around the IV site. Get help right away if: You have trouble breathing. You have new confusion after you get home. These symptoms may be an emergency. Get help right away. Call 911. Do not wait to see if the symptoms will go away. Do not drive yourself to the hospital. This information is not intended to replace advice given to you by your health care provider. Make sure you discuss any questions you have with your health care provider. Document Revised: 02/08/2022 Document Reviewed: 02/08/2022 Elsevier Patient Education  2024 [employer].

## 2023-08-22 ENCOUNTER — Encounter (HOSPITAL_COMMUNITY)
Admission: RE | Admit: 2023-08-22 | Discharge: 2023-08-22 | Disposition: A | Discharge status: Home / Self Care | Payer: BC Managed Care – PPO | Source: Ambulatory Visit | Attending: Gastroenterology | Admitting: Gastroenterology

## 2023-08-22 VITALS — BP 114/72 | HR 93 | Temp 97.8°F | Resp 18 | Ht 62.0 in | Wt 292.4 lb

## 2023-08-22 DIAGNOSIS — Z01812 Encounter for preprocedural laboratory examination: Secondary | ICD-10-CM | POA: Diagnosis present

## 2023-08-22 DIAGNOSIS — R7303 Prediabetes: Secondary | ICD-10-CM | POA: Diagnosis not present

## 2023-08-22 DIAGNOSIS — K7460 Unspecified cirrhosis of liver: Secondary | ICD-10-CM | POA: Insufficient documentation

## 2023-08-22 LAB — CBC WITH DIFFERENTIAL/PLATELET
Abs Immature Granulocytes: 0.01 10*3/uL (ref 0.00–0.07)
Basophils Absolute: 0 10*3/uL (ref 0.0–0.1)
Basophils Relative: 0 %
Eosinophils Absolute: 0.1 10*3/uL (ref 0.0–0.5)
Eosinophils Relative: 2 %
HCT: 41.8 % (ref 36.0–46.0)
Hemoglobin: 13.2 g/dL (ref 12.0–15.0)
Immature Granulocytes: 0 %
Lymphocytes Relative: 71 %
Lymphs Abs: 4.6 10*3/uL — ABNORMAL HIGH (ref 0.7–4.0)
MCH: 23 pg — ABNORMAL LOW (ref 26.0–34.0)
MCHC: 31.6 g/dL (ref 30.0–36.0)
MCV: 72.9 fL — ABNORMAL LOW (ref 80.0–100.0)
Monocytes Absolute: 0.5 10*3/uL (ref 0.1–1.0)
Monocytes Relative: 8 %
Neutro Abs: 1.2 10*3/uL — ABNORMAL LOW (ref 1.7–7.7)
Neutrophils Relative %: 19 %
Platelets: 157 10*3/uL (ref 150–400)
RBC: 5.73 MIL/uL — ABNORMAL HIGH (ref 3.87–5.11)
RDW: 17 % — ABNORMAL HIGH (ref 11.5–15.5)
WBC: 6.4 10*3/uL (ref 4.0–10.5)
nRBC: 0 % (ref 0.0–0.2)

## 2023-08-22 LAB — COMPREHENSIVE METABOLIC PANEL
ALT: 53 U/L — ABNORMAL HIGH (ref 0–44)
AST: 70 U/L — ABNORMAL HIGH (ref 15–41)
Albumin: 3.3 g/dL — ABNORMAL LOW (ref 3.5–5.0)
Alkaline Phosphatase: 136 U/L — ABNORMAL HIGH (ref 38–126)
Anion gap: 7 (ref 5–15)
BUN: 10 mg/dL (ref 6–20)
CO2: 22 mmol/L (ref 22–32)
Calcium: 8.5 mg/dL — ABNORMAL LOW (ref 8.9–10.3)
Chloride: 109 mmol/L (ref 98–111)
Creatinine, Ser: 0.75 mg/dL (ref 0.44–1.00)
GFR, Estimated: >60 mL/min (ref >60)
Glucose, Bld: 82 mg/dL (ref 70–99)
Potassium: 3.5 mmol/L (ref 3.5–5.1)
Sodium: 138 mmol/L (ref 135–145)
Total Bilirubin: 0.8 mg/dL (ref <1.2)
Total Protein: 7.5 g/dL (ref 6.5–8.1)

## 2023-08-22 LAB — PROTIME-INR
INR: 1.1 (ref 0.8–1.2)
Prothrombin Time: 14.3 s (ref 11.4–15.2)

## 2023-08-24 IMAGING — MG MM DIGITAL SCREENING BILAT W/ TOMO AND CAD
8 of 14 series · 8 of 40 positions shown · non-contrast
Comparison: Previous exam(s).

CLINICAL DATA: Screening.

EXAM:
DIGITAL SCREENING BILATERAL MAMMOGRAM WITH TOMOSYNTHESIS AND CAD
TECHNIQUE: Bilateral screening digital craniocaudal and mediolateral oblique
mammograms were obtained. Bilateral screening digital breast
tomosynthesis was performed. The images were evaluated with
computer-aided detection.

[R CC synth-2D]
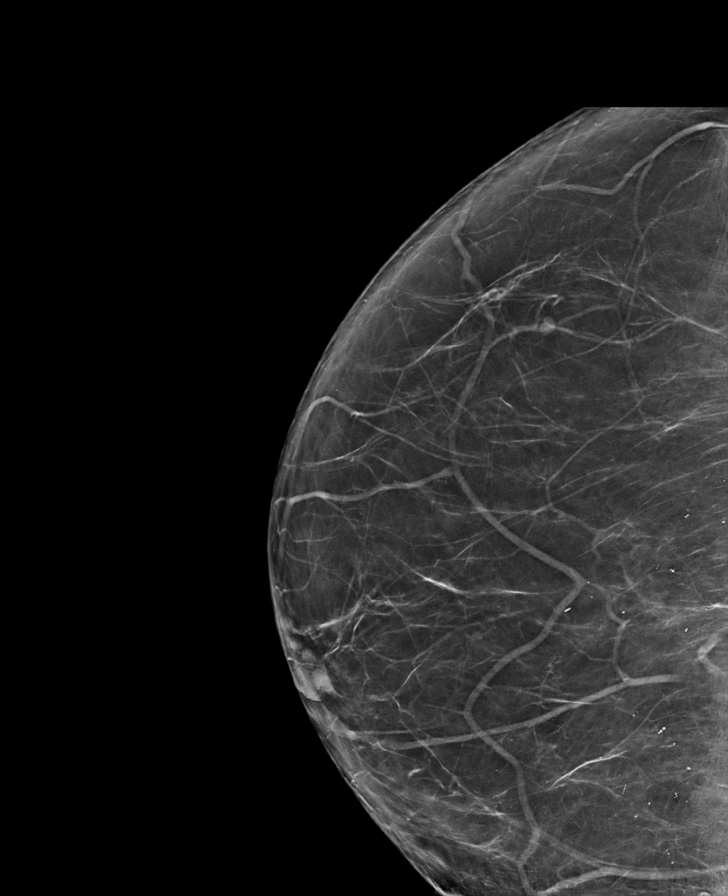

[L CC synth-2D]
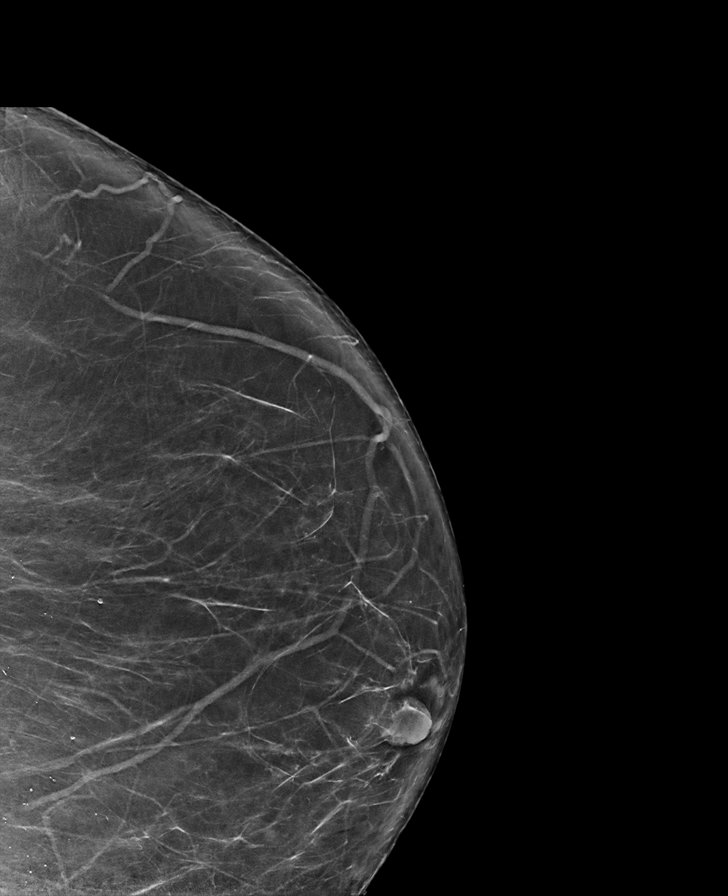

[L MLO synth-2D (1 of 2)]
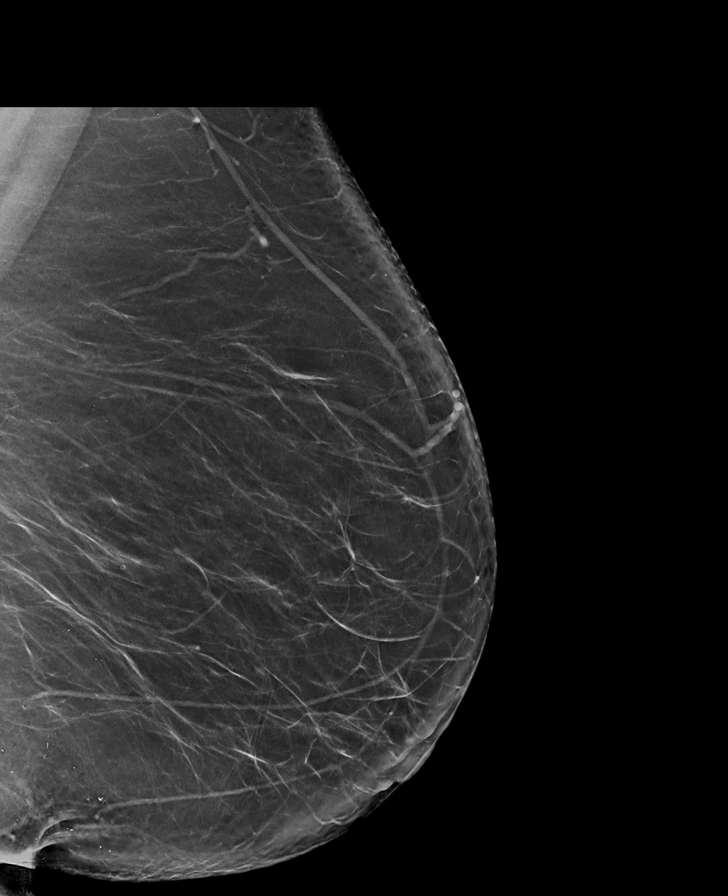

[R MLO synth-2D (1 of 2)]
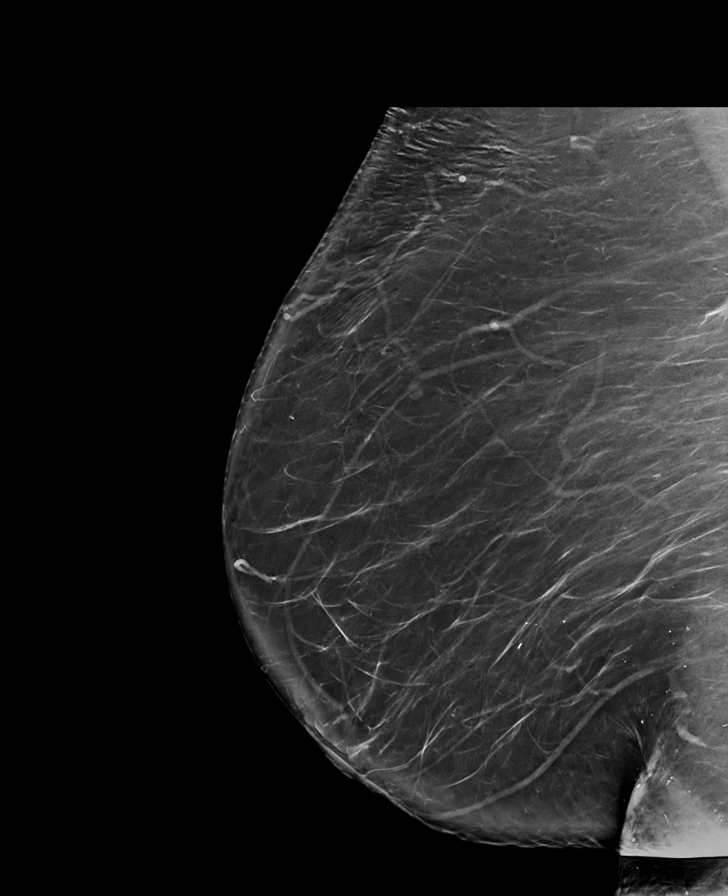

[R CV synth-2D]
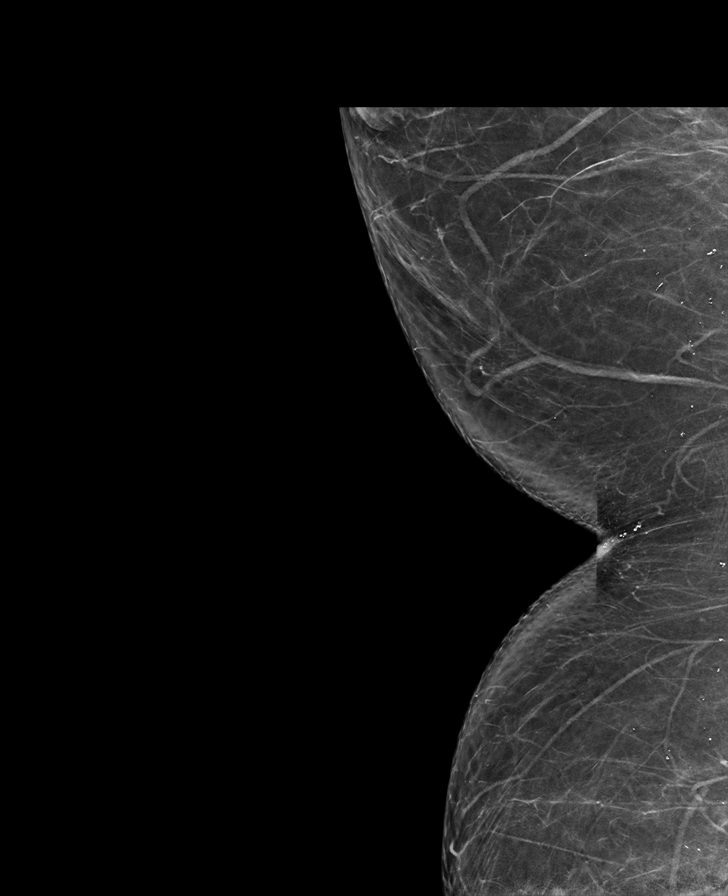

[R MLO synth-2D (2 of 2)]
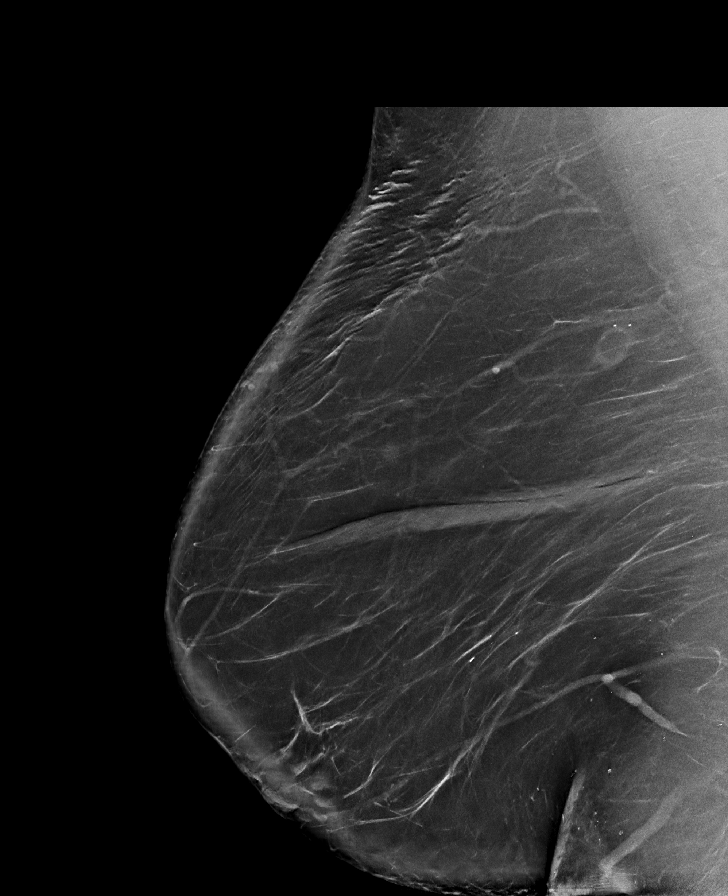

[L MLO synth-2D (2 of 2)]
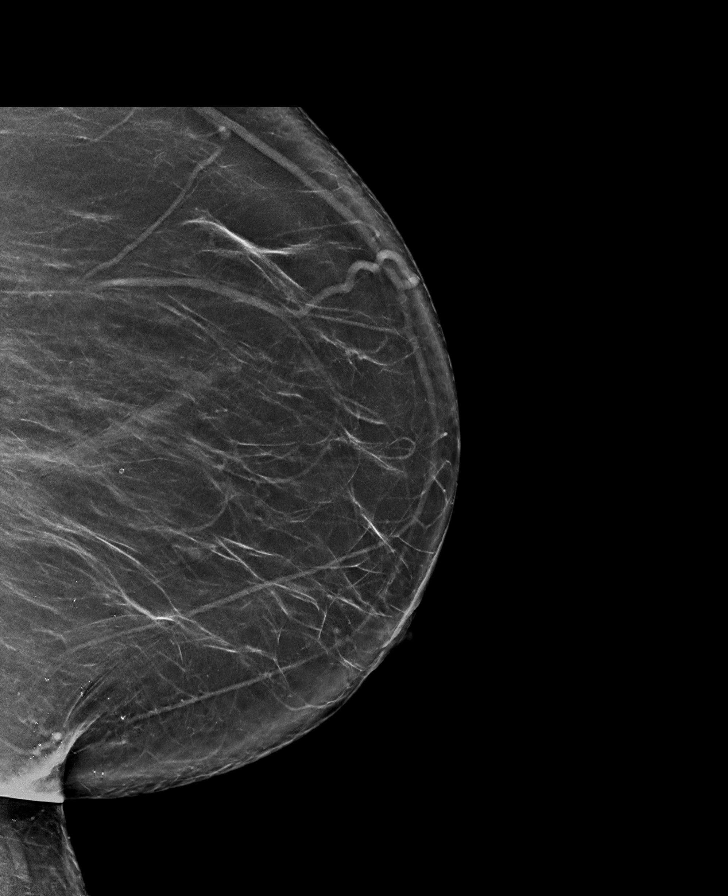

[R MLO tomo · tomo slice 57/113.0]
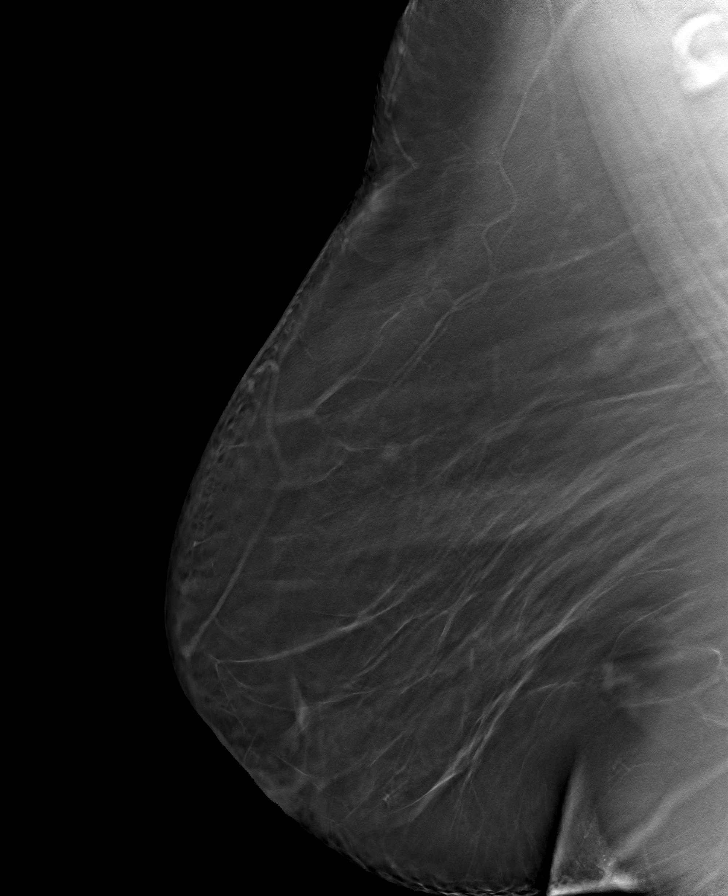

[8 of 40 positions shown; findings below may reference images not displayed]

ACR Breast Density Category b: There are scattered areas of
fibroglandular density.
FINDINGS: There are no findings suspicious for malignancy.
IMPRESSION: No mammographic evidence of malignancy. A result letter of this
screening mammogram will be mailed directly to the patient.

RECOMMENDATION:
Screening mammogram in one year. (Code:51-O-LD2)

BI-RADS CATEGORY  1: Negative.

## 2023-08-30 ENCOUNTER — Ambulatory Visit (HOSPITAL_COMMUNITY)
Admission: RE | Admit: 2023-08-30 | Discharge: 2023-08-30 | Disposition: A | Discharge status: Home / Self Care | Payer: BC Managed Care – PPO | Attending: Gastroenterology | Admitting: Gastroenterology

## 2023-08-30 ENCOUNTER — Encounter (HOSPITAL_COMMUNITY): Payer: Self-pay | Admitting: Gastroenterology

## 2023-08-30 ENCOUNTER — Ambulatory Visit (HOSPITAL_COMMUNITY): Payer: BC Managed Care – PPO | Admitting: Anesthesiology

## 2023-08-30 ENCOUNTER — Encounter (HOSPITAL_COMMUNITY)
Admission: RE | Disposition: A | Discharge status: Home / Self Care | Payer: Self-pay | Source: Home / Self Care | Attending: Gastroenterology

## 2023-08-30 ENCOUNTER — Encounter (INDEPENDENT_AMBULATORY_CARE_PROVIDER_SITE_OTHER): Payer: Self-pay | Admitting: *Deleted

## 2023-08-30 DIAGNOSIS — D563 Thalassemia minor: Secondary | ICD-10-CM | POA: Insufficient documentation

## 2023-08-30 DIAGNOSIS — E785 Hyperlipidemia, unspecified: Secondary | ICD-10-CM | POA: Insufficient documentation

## 2023-08-30 DIAGNOSIS — I10 Essential (primary) hypertension: Secondary | ICD-10-CM | POA: Insufficient documentation

## 2023-08-30 DIAGNOSIS — K7581 Nonalcoholic steatohepatitis (NASH): Secondary | ICD-10-CM | POA: Diagnosis not present

## 2023-08-30 DIAGNOSIS — Z6841 Body Mass Index (BMI) 40.0 and over, adult: Secondary | ICD-10-CM | POA: Diagnosis not present

## 2023-08-30 DIAGNOSIS — F419 Anxiety disorder, unspecified: Secondary | ICD-10-CM | POA: Insufficient documentation

## 2023-08-30 DIAGNOSIS — K219 Gastro-esophageal reflux disease without esophagitis: Secondary | ICD-10-CM | POA: Insufficient documentation

## 2023-08-30 DIAGNOSIS — I8510 Secondary esophageal varices without bleeding: Secondary | ICD-10-CM | POA: Insufficient documentation

## 2023-08-30 DIAGNOSIS — Z800 Family history of malignant neoplasm of digestive organs: Secondary | ICD-10-CM | POA: Diagnosis not present

## 2023-08-30 DIAGNOSIS — K644 Residual hemorrhoidal skin tags: Secondary | ICD-10-CM | POA: Diagnosis not present

## 2023-08-30 DIAGNOSIS — F418 Other specified anxiety disorders: Secondary | ICD-10-CM | POA: Insufficient documentation

## 2023-08-30 DIAGNOSIS — K621 Rectal polyp: Secondary | ICD-10-CM | POA: Diagnosis not present

## 2023-08-30 DIAGNOSIS — Z1211 Encounter for screening for malignant neoplasm of colon: Secondary | ICD-10-CM | POA: Diagnosis not present

## 2023-08-30 DIAGNOSIS — K648 Other hemorrhoids: Secondary | ICD-10-CM | POA: Diagnosis not present

## 2023-08-30 DIAGNOSIS — K635 Polyp of colon: Secondary | ICD-10-CM | POA: Diagnosis not present

## 2023-08-30 DIAGNOSIS — E669 Obesity, unspecified: Secondary | ICD-10-CM | POA: Insufficient documentation

## 2023-08-30 HISTORY — PX: COLONOSCOPY WITH PROPOFOL: SHX5780

## 2023-08-30 HISTORY — PX: POLYPECTOMY: SHX149

## 2023-08-30 LAB — HM COLONOSCOPY

## 2023-08-30 SURGERY — COLONOSCOPY WITH PROPOFOL
Anesthesia: General

## 2023-08-30 MED ORDER — LACTATED RINGERS IV SOLN: INTRAVENOUS | Status: DC | PRN

## 2023-08-30 MED ORDER — LIDOCAINE HCL (CARDIAC) PF 100 MG/5ML IV SOSY
PREFILLED_SYRINGE | INTRAVENOUS | Status: DC | PRN
Start: 2023-08-30 — End: 2023-08-30
  Administered 2023-08-30: 50 mg via INTRATRACHEAL

## 2023-08-30 MED ORDER — PROPOFOL 10 MG/ML IV BOLUS
INTRAVENOUS | Status: DC | PRN
Start: 2023-08-30 — End: 2023-08-30
  Administered 2023-08-30: 75 mg via INTRAVENOUS
  Administered 2023-08-30: 25 mg via INTRAVENOUS

## 2023-08-30 MED ORDER — PROPOFOL 500 MG/50ML IV EMUL
INTRAVENOUS | Status: DC | PRN
  Administered 2023-08-30: 120 ug/kg/min via INTRAVENOUS

## 2023-08-30 MED ORDER — STERILE WATER FOR IRRIGATION IR SOLN
Status: DC | PRN
  Administered 2023-08-30: 180 mL

## 2023-08-30 NOTE — Anesthesia Postprocedure Evaluation (Signed)
Anesthesia Post Note  Patient: Holly Alvarado  Procedure(s) Performed: COLONOSCOPY WITH PROPOFOL POLYPECTOMY INTESTINAL  Patient location during evaluation: Short Stay Anesthesia Type: General Level of consciousness: awake and alert Pain management: pain level controlled Vital Signs Assessment: post-procedure vital signs reviewed and stable Respiratory status: spontaneous breathing Cardiovascular status: blood pressure returned to baseline and stable Postop Assessment: no apparent nausea or vomiting Anesthetic complications: no   No notable events documented.   Last Vitals:  Vitals:   08/30/23 0751  BP: 135/69  Pulse: 67  Resp: (!) 22  Temp: 36.6 C  SpO2: 100%    Last Pain:  Vitals:   08/30/23 0955  TempSrc:   PainSc: 0-No pain                 Obe Ahlers

## 2023-08-30 NOTE — H&P (Signed)
Holly Alvarado is an 57 y.o. female.   Chief Complaint: Screening, family history of colon cancer HPI: 57 y.o. 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 Pylori, obesity, Htn, HLD, anxiety, GERD coming for screening colonoscopy and family history of colon cancer.  Last colonoscopy in 2019, no presence of any abnormalities.  THe patient denies having any complaints such as melena, hematochezia, abdominal pain or distention, change in her bowel movement consistency or frequency, no changes in weight recently.  Multiple aunts had colorectal cancer.   Past Medical History:  Diagnosis Date   Allergic rhinitis, seasonal    Anemia    hx of    Anxiety    hx of panic attacks and panic attacks with surgery    Anxiety disorder    Beta thalassemia trait    Chronic constipation    Complication of anesthesia    woke up during surgery of tubal ligation    Esophageal polyp    Esophageal varices (HCC)    Fatty liver    GERD (gastroesophageal reflux disease)    Hemorrhoids    Hypertension    Obesity     Past Surgical History:  Procedure Laterality Date   BIOPSY  05/21/2022   Procedure: BIOPSY;  Surgeon: Dolores Frame, MD;  Location: [facility];  Service: Gastroenterology;;   BLT  1987   BREAST BIOPSY Right 2010   benign   BREAST BIOPSY Left 10/07/2022   [place] LT BREAST BX W LOC DEV 1ST LESION IMG BX SPEC US GUIDE 10/07/2022 [facility]   BREAST BIOPSY  03/07/2023   MM LT RADIOACTIVE SEED LOC MAMMO GUIDE 03/07/2023 [facility]   BREATH TEK H PYLORI N/A 02/25/2014   Procedure: BREATH TEK H PYLORI;  Surgeon: Valarie Merino, MD;  Location: [facility];  Service: General;  Laterality: N/A;   CHOLECYSTECTOMY  1997   COLONOSCOPY  12/30/2010   COLONOSCOPY N/A 11/23/2017   Procedure: COLONOSCOPY;  Surgeon: Malissa Hippo, MD;  Location: [facility];  Service: Endoscopy;  Laterality: N/A;  1130   ECTOPIC PREGNANCY SURGERY  1994, 1995    ESOPHAGOGASTRODUODENOSCOPY (EGD) WITH PROPOFOL N/A 05/21/2022   Procedure: ESOPHAGOGASTRODUODENOSCOPY (EGD) WITH PROPOFOL;  Surgeon: Dolores Frame, MD;  Location: [facility];  Service: Gastroenterology;  Laterality: N/A;  900 ASA 2   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   Procedure: LAPAROSCOPIC ROUX-EN-Y GASTRIC BYPASS WITH UPPER ENDOSCOPY;  Surgeon: Valarie Merino, MD;  Location: [facility];  Service: General;  Laterality: N/A;   MOUTH SURGERY     wisdom tooth extraction   PARTIAL HYSTERECTOMY     RADIOACTIVE SEED GUIDED EXCISIONAL BREAST BIOPSY Left 03/08/2023   Procedure: RADIOACTIVE SEED GUIDED EXCISIONAL LEFT BREAST BIOPSY;  Surgeon: Emelia Loron, MD;  Location: [facility];  Service: General;  Laterality: Left;   TOTAL ABDOMINAL HYSTERECTOMY  03/11/08   for fibroids    TUBAL LIGATION     UPPER GASTROINTESTINAL ENDOSCOPY  12/30/2010   UPPER GASTROINTESTINAL ENDOSCOPY  05/11/04   UPPER GASTROINTESTINAL ENDOSCOPY  04/30/97   ANWAR    Family History  Problem Relation Age of Onset   Asthma Mother    Angioedema Mother    Hypertension Mother    COPD Mother    Depression Mother    Hyperlipidemia Father    Hypertension Father    Depression Father    Alcohol abuse Father    Drug abuse Father    Hyperlipidemia Sister  Hypertension Sister    Hyperlipidemia Sister    Hypertension Sister    Colon cancer Maternal Aunt    Colon cancer Maternal Aunt    Colon cancer Maternal Aunt    Colon cancer Maternal Aunt    Colon cancer Maternal Aunt    Colon cancer Maternal Aunt    Schizophrenia Maternal Uncle    Diabetes Other        family history    Asthma Other        family history    Arthritis Other        family history    Stroke Other    Cancer Other    Heart disease Other    Social History:  reports that she has never smoked. She has never been exposed to tobacco smoke. She has never used smokeless tobacco. She reports that she does not drink alcohol and does not use  drugs.  Allergies:  Allergies  Allergen Reactions   Codeine     Severe headache.     Sulfa Antibiotics Hives   Tape Rash    Foam tape - caused blisters and severe itching   Aspirin     Due to Gastric Procedure   Miconazole Swelling   Sulfonamide Derivatives Hives and Itching    Medications Prior to Admission  Medication Sig Dispense Refill   acetaminophen (TYLENOL) 500 MG tablet Take 500-1,000 mg by mouth every 6 (six) hours as needed for moderate pain.     amLODipine (NORVASC) 2.5 MG tablet TAKE 1 TABLET BY MOUTH EVERY DAY 90 tablet 3   carvedilol (COREG) 6.25 MG tablet Take 6.25 mg by mouth 2 (two) times daily with a meal.     diphenhydrAMINE (BENADRYL) 25 MG tablet Take 25 mg by mouth daily as needed for itching.     fluticasone furoate-vilanterol (BREO ELLIPTA) 100-25 MCG/ACT AEPB Inhale 1 puff into the lungs daily. 30 each 5   levocetirizine (XYZAL) 5 MG tablet Take 1 tablet (5 mg total) by mouth every evening. 90 tablet 1   montelukast (SINGULAIR) 10 MG tablet Take 1 tablet (10 mg total) by mouth at bedtime. 90 tablet 1   Olopatadine-Mometasone (RYALTRIS) 665-25 MCG/ACT SUSP Place 2 sprays into the nose 2 (two) times daily as needed. 29 g 5   OVER THE COUNTER MEDICATION Staylene Eye drops prn allergies.     polyethylene glycol (MIRALAX / GLYCOLAX) 17 g packet Take 17 g by mouth daily as needed for moderate constipation.     venlafaxine XR (EFFEXOR XR) 150 MG 24 hr capsule Take 1 capsule (150 mg total) by mouth daily with breakfast. 90 capsule 5   Vitamin D, Ergocalciferol, (DRISDOL) 1.25 MG (50000 UNIT) CAPS capsule TAKE 1 CAPSULE (50,000 UNITS TOTAL) BY MOUTH ONCE A WEEK. ONE CAPSULE ONCE WEEKLY 12 capsule 2   alum & mag hydroxide-simeth (MAALOX/MYLANTA) 200-200-20 MG/5ML suspension Take 15-30 mLs by mouth every 6 (six) hours as needed for indigestion or heartburn. (Patient not taking: Reported on 08/12/2023)     Fluocinolone Acetonide Body (DERMA-SMOOTHE/FS BODY) 0.01 % OIL  Use one application 3-5 times weekly to control scalp inflammation. (Patient not taking: Reported on 08/12/2023) 118 mL 5   fluticasone (FLONASE) 50 MCG/ACT nasal spray Place 2 sprays into both nostrils daily. (Patient not taking: Reported on 08/12/2023) 16 g 6    No results found for this or any previous visit (from the past 48 hour(s)). No results found.  Review of Systems  All other systems reviewed and are  negative.   Blood pressure 135/69, pulse 67, temperature 97.9 F (36.6 C), temperature source Oral, resp. rate (!) 22, height 5\' 2"  (1.575 m), weight 132.6 kg, SpO2 100%. Physical Exam  GENERAL: The patient is AO x3, in no acute distress. HEENT: Head is normocephalic and atraumatic. EOMI are intact. Mouth is well hydrated and without lesions. NECK: Supple. No masses LUNGS: Clear to auscultation. No presence of rhonchi/wheezing/rales. Adequate chest expansion HEART: RRR, normal s1 and s2. ABDOMEN: Soft, nontender, no guarding, no peritoneal signs, and nondistended. BS +. No masses. EXTREMITIES: Without any cyanosis, clubbing, rash, lesions or edema. NEUROLOGIC: AOx3, no focal motor deficit. SKIN: no jaundice, no rashes  Assessment/Plan  57 y.o. 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 Pylori, obesity, Htn, HLD, anxiety, GERD coming for screening colonoscopy and family history of colon cancer.  Will proceed with colonoscopy.  Dolores Frame, MD 08/30/2023, 8:38 AM

## 2023-08-30 NOTE — Addendum Note (Signed)
Addendum  created 08/30/23 1105 by Moshe Salisbury, CRNA   Intraprocedure Staff edited

## 2023-08-30 NOTE — Anesthesia Preprocedure Evaluation (Addendum)
Anesthesia Evaluation  Patient identified by MRN, date of birth, ID band Patient awake    Reviewed: Allergy & Precautions, H&P , NPO status , Patient's Chart, lab work & pertinent test results, reviewed documented beta blocker date and time   Airway Mallampati: II  TM Distance: >3 FB Neck ROM: full    Dental  (+) Dental Advisory Given, Upper Dentures, Missing Several missing teeth lower:   Pulmonary neg pulmonary ROS   Pulmonary exam normal breath sounds clear to auscultation       Cardiovascular Exercise Tolerance: Good hypertension, Normal cardiovascular exam Rhythm:regular Rate:Normal     Neuro/Psych  PSYCHIATRIC DISORDERS Anxiety Depression    negative neurological ROS     GI/Hepatic ,GERD  Medicated,,Fatty liver Esophageal varices   Endo/Other    Class 4 obesity  Renal/GU negative Renal ROS  negative genitourinary   Musculoskeletal   Abdominal   Peds  Hematology  (+) Blood dyscrasia, anemia   Anesthesia Other Findings Beta thalasemia trait  Reproductive/Obstetrics negative OB ROS                             Anesthesia Physical Anesthesia Plan  ASA: 4  Anesthesia Plan: General   Post-op Pain Management: Minimal or no pain anticipated   Induction: Intravenous  PONV Risk Score and Plan: Propofol infusion  Airway Management Planned: Nasal Cannula and Natural Airway  Additional Equipment: None  Intra-op Plan:   Post-operative Plan:   Informed Consent: I have reviewed the patients History and Physical, chart, labs and discussed the procedure including the risks, benefits and alternatives for the proposed anesthesia with the patient or authorized representative who has indicated his/her understanding and acceptance.     Dental Advisory Given  Plan Discussed with: CRNA  Anesthesia Plan Comments:         Anesthesia Quick Evaluation

## 2023-08-30 NOTE — Transfer of Care (Signed)
Immediate Anesthesia Transfer of Care Note  Patient: Holly Alvarado  Procedure(s) Performed: COLONOSCOPY WITH PROPOFOL POLYPECTOMY INTESTINAL  Patient Location: Short Stay  Anesthesia Type:General  Level of Consciousness: awake  Airway & Oxygen Therapy: Patient Spontanous Breathing  Post-op Assessment: Report given to RN  Post vital signs: Reviewed and stable  Last Vitals:  Vitals Value Taken Time  BP    Temp    Pulse    Resp    SpO2      Last Pain:  Vitals:   08/30/23 0955  TempSrc:   PainSc: 0-No pain         Complications: No notable events documented.

## 2023-08-30 NOTE — Discharge Instructions (Signed)
You are being discharged to home.  Resume your previous diet.  We are waiting for your pathology results.  Your physician has recommended a repeat colonoscopy in five years for surveillance.  

## 2023-08-30 NOTE — Op Note (Signed)
[facility] Patient Name: Holly Alvarado Procedure Date: 08/30/2023 9:41 AM MRN: [id number] Date of Birth: 07/26/1966 Attending MD: Katrinka Blazing , , [id number] CSN: [id number] Age: 57 Admit Type: Outpatient Procedure:                Colonoscopy Indications:              Screening for colon cancer: Family history of                            colorectal cancer in multiple 2nd degree relatives Providers:                Katrinka Blazing, Crystal Page, Dyann Ruddle Referring MD:              Medicines:                Monitored Anesthesia Care Complications:            No immediate complications. Estimated Blood Loss:     Estimated blood loss: none. Procedure:                Pre-Anesthesia Assessment:                           - Prior to the procedure, a History and Physical                            was performed, and patient medications, allergies                            and sensitivities were reviewed. The patient's                            tolerance of previous anesthesia was reviewed.                           - The risks and benefits of the procedure and the                            sedation options and risks were discussed with the                            patient. All questions were answered and informed                            consent was obtained.                           - ASA Grade Assessment: II - A patient with mild                            systemic disease.                           After obtaining informed consent, the colonoscope                            was passed under  direct vision. Throughout the                            procedure, the patient's blood pressure, pulse, and                            oxygen saturations were monitored continuously. The                            [id number] ([id number]) was introduced through the                            anus and advanced to the the cecum, identified by                            appendiceal  orifice and ileocecal valve. The                            colonoscopy was performed without difficulty. The                            patient tolerated the procedure well. The quality                            of the bowel preparation was good. Scope In: 10:00:56 AM Scope Out: 10:24:18 AM Scope Withdrawal Time: 0 hours 14 minutes 55 seconds  Total Procedure Duration: 0 hours 23 minutes 22 seconds  Findings:      Hemorrhoids were found on perianal exam.      A 3 mm polyp was found in the rectum. The polyp was sessile. The polyp       was removed with a cold snare. Resection and retrieval were complete.      Non-bleeding internal hemorrhoids were found during retroflexion. The       hemorrhoids were small. Impression:               - Hemorrhoids found on perianal exam.                           - One 3 mm polyp in the rectum, removed with a cold                            snare. Resected and retrieved.                           - Non-bleeding internal hemorrhoids. Moderate Sedation:      Per Anesthesia Care Recommendation:           - Discharge patient to home (ambulatory).                           - Resume previous diet.                           - Await pathology results.                           -  Repeat colonoscopy in 5 years for surveillance. Procedure Code(s):        --- Professional ---                           [phone], Colonoscopy, flexible; with removal of                            tumor(s), polyp(s), or other lesion(s) by snare                            technique Diagnosis Code(s):        --- Professional ---                           Z80.0, Family history of malignant neoplasm of                            digestive organs                           D12.8, Benign neoplasm of rectum                           K64.8, Other hemorrhoids CPT copyright 2022 [employer]. All rights reserved. The codes documented in this report are preliminary and upon coder  review may  be revised to meet current compliance requirements. Katrinka Blazing, MD Katrinka Blazing,  08/30/2023 10:36:29 AM This report has been signed electronically. Number of Addenda: 0

## 2023-08-31 ENCOUNTER — Ambulatory Visit: Payer: BC Managed Care – PPO | Admitting: Family Medicine

## 2023-08-31 ENCOUNTER — Encounter: Payer: Self-pay | Admitting: Family Medicine

## 2023-08-31 VITALS — BP 118/76 | HR 80 | Ht 62.0 in | Wt 291.0 lb

## 2023-08-31 DIAGNOSIS — R7303 Prediabetes: Secondary | ICD-10-CM | POA: Diagnosis not present

## 2023-08-31 DIAGNOSIS — L659 Nonscarring hair loss, unspecified: Secondary | ICD-10-CM | POA: Diagnosis not present

## 2023-08-31 DIAGNOSIS — E559 Vitamin D deficiency, unspecified: Secondary | ICD-10-CM

## 2023-08-31 DIAGNOSIS — E785 Hyperlipidemia, unspecified: Secondary | ICD-10-CM | POA: Diagnosis not present

## 2023-08-31 DIAGNOSIS — B350 Tinea barbae and tinea capitis: Secondary | ICD-10-CM | POA: Diagnosis not present

## 2023-08-31 DIAGNOSIS — G459 Transient cerebral ischemic attack, unspecified: Secondary | ICD-10-CM

## 2023-08-31 DIAGNOSIS — I10 Essential (primary) hypertension: Secondary | ICD-10-CM

## 2023-08-31 DIAGNOSIS — R200 Anesthesia of skin: Secondary | ICD-10-CM

## 2023-08-31 DIAGNOSIS — R29810 Facial weakness: Secondary | ICD-10-CM

## 2023-08-31 LAB — SURGICAL PATHOLOGY

## 2023-08-31 NOTE — Progress Notes (Signed)
Holly Alvarado     MRN: [id number]      DOB: [birth year]  Chief Complaint  Patient presents with   Follow-up    Follow up issue with scalp.    HPI Holly Alvarado is here for follow up and re-evaluation of chronic medical conditions, medication management and review of any available recent lab and radiology data.  Preventive health is updated, specifically  Cancer screening and Immunization.   Questions or concerns regarding consultations or procedures which the PT has had in the interim are  addressed. The PT denies any adverse reactions to current medications since the last visit.  Thinning and hair loss with itchy rashes on scalp x 4 weeks  and up to 3 months  Weight concerns Imntermittent right sided facial numbness wih drooping right eye up to 5 mins, tingling andf numbness of right lip and tongue x 18 months , more noticeable x3 months  ROS Denies recent fever or chills. Denies sinus pressure, nasal congestion, ear pain or sore throat. Denies chest congestion, productive cough or wheezing. Denies chest pains, palpitations and leg swelling Denies abdominal pain, nausea, vomiting,diarrhea or constipation.   Denies dysuria, frequency, hesitancy or incontinence. Denies joint pain, swelling and limitation in mobility.  Denies depression, anxiety or insomnia.  PE  BP 118/76 (BP Location: Right Arm, Patient Position: Sitting, Cuff Size: Large)   Pulse 80   Ht 5\' 2"  (1.575 m)   Wt 291 lb 0.6 oz (132 kg)   SpO2 97%   BMI 53.23 kg/m   Patient alert and oriented and in no cardiopulmonary distress.  HEENT: No facial asymmetry, EOMI,     Neck supple .  Chest: Clear to auscultation bilaterally.  CVS: S1, S2 no murmurs, no S3.Regular rate.  ABD: Soft non tender.   Ext: No edema  MS: Adequate ROM spine, shoulders, hips and knees.  Skin: Intact, no ulcerations or rash noted.  Psych: Good eye contact, normal affect. Memory intact not anxious or depressed appearing.  CNS: CN  2-12 intact, power,  normal throughout.no focal deficits noted.   Assessment & Plan  Hair loss Ongoing hair loss for past over 3 months, refer dermatology  Tinea capitis Has localizewd hair loss , may repreesent tinea , will refer Derm for eval  Essential hypertension Controlled, no change in medication DASH diet and commitment to daily physical activity for a minimum of 30 minutes discussed and encouraged, as a part of hypertension management. The importance of attaining a healthy weight is also discussed.     08/31/2023    3:58 PM 08/30/2023   10:36 AM 08/30/2023   10:31 AM 08/30/2023    7:51 AM 08/22/2023    1:23 PM 08/12/2023    3:58 PM 06/01/2023    2:30 PM  BP/Weight  Systolic BP 118 121 84 135 114 114 110  Diastolic BP 76 83 53 69 72 72 80  Wt. (Lbs) 291.04   292.33 292.37 292.38 296.2  BMI 53.23 kg/m2   53.47 kg/m2 53.48 kg/m2 53.48 kg/m2 54.18 kg/m2       Obesity, morbid (HCC) Unchanged  Patient re-educated about  the importance of commitment to a  minimum of 150 minutes of exercise per week as able.  The importance of healthy food choices with portion control discussed, as well as eating regularly and within a 12 hour window most days. The need to choose "clean , green" food 50 to 75% of the time is discussed, as well as to make water  the primary drink and set a goal of 64 ounces water daily.       08/31/2023    3:58 PM 08/30/2023    7:51 AM 08/22/2023    1:23 PM  Weight /BMI  Weight 291 lb 0.6 oz 292 lb 5.3 oz 292 lb 6 oz  Height 5\' 2"  (1.575 m) 5\' 2"  (1.575 m) 5\' 2"  (1.575 m)  BMI 53.23 kg/m2 53.47 kg/m2 53.48 kg/m2      Vitamin D deficiency Corrected vcontinue supplement  Prediabetes Unchanged  Patient educated about the importance of limiting  Carbohydrate intake , the need to commit to daily physical activity for a minimum of 30 minutes , and to commit weight loss. The fact that changes in all these areas will reduce or eliminate all together the  development of diabetes is stressed.      Latest Ref Rng & Units 09/02/2023    8:10 AM 08/22/2023    1:26 PM 03/17/2023    8:55 AM 03/04/2023    2:00 PM 08/18/2022    1:24 PM  Diabetic Labs  HbA1c 4.8 - 5.6 % 6.4   6.4     Chol 100 - 199 mg/dL 782   956     HDL >21 mg/dL 35   35     Calc LDL 0 - 99 mg/dL 80   308     Triglycerides 0 - 149 mg/dL 84   657     Creatinine 0.44 - 1.00 mg/dL  8.46   9.62  9.52       08/31/2023    3:58 PM 08/30/2023   10:36 AM 08/30/2023   10:31 AM 08/30/2023    7:51 AM 08/22/2023    1:23 PM 08/12/2023    3:58 PM 06/01/2023    2:30 PM  BP/Weight  Systolic BP 118 121 84 135 114 114 110  Diastolic BP 76 83 53 69 72 72 80  Wt. (Lbs) 291.04   292.33 292.37 292.38 296.2  BMI 53.23 kg/m2   53.47 kg/m2 53.48 kg/m2 53.48 kg/m2 54.18 kg/m2       No data to display            Facial weakness Reports 18 month h/o intermittent right facial numbness and weaknesss, worse in apst 3 monhts, needs brain scan and Neuro eval

## 2023-08-31 NOTE — Patient Instructions (Addendum)
F/U IN 3 MONTHS, CALL IF YOU NEED ME SOONER  Fasting lipid, tSH, HBA1C and Vit d this week  You are referred to Dermatology re scalp lesions  You will be referred for brain scan and to see Neurologist  I RECOMMEND WEIGHT WATCHERS  It is important that you exercise regularly at least 30 minutes 5 times a week. If you develop chest pain, have severe difficulty breathing, or feel very tired, stop exercising immediately and seek medical attention

## 2023-09-03 LAB — VITAMIN D 25 HYDROXY (VIT D DEFICIENCY, FRACTURES): Vit D, 25-Hydroxy: 35.8 ng/mL (ref 30.0–100.0)

## 2023-09-03 LAB — TSH: TSH: 0.865 u[IU]/mL (ref 0.450–4.500)

## 2023-09-03 LAB — LIPID PANEL
Chol/HDL Ratio: 3.7 {ratio} (ref 0.0–4.4)
Cholesterol, Total: 131 mg/dL (ref 100–199)
HDL: 35 mg/dL — ABNORMAL LOW (ref >39)
LDL Chol Calc (NIH): 80 mg/dL (ref 0–99)
Triglycerides: 84 mg/dL (ref 0–149)
VLDL Cholesterol Cal: 16 mg/dL (ref 5–40)

## 2023-09-03 LAB — HEMOGLOBIN A1C
Est. average glucose Bld gHb Est-mCnc: 137 mg/dL
Hgb A1c MFr Bld: 6.4 % — ABNORMAL HIGH (ref 4.8–5.6)

## 2023-09-07 ENCOUNTER — Encounter (HOSPITAL_COMMUNITY): Payer: Self-pay | Admitting: Gastroenterology

## 2023-09-07 ENCOUNTER — Encounter (INDEPENDENT_AMBULATORY_CARE_PROVIDER_SITE_OTHER): Payer: Self-pay | Admitting: Gastroenterology

## 2023-09-08 ENCOUNTER — Encounter (INDEPENDENT_AMBULATORY_CARE_PROVIDER_SITE_OTHER): Payer: Self-pay | Admitting: *Deleted

## 2023-09-19 DIAGNOSIS — L659 Nonscarring hair loss, unspecified: Secondary | ICD-10-CM | POA: Insufficient documentation

## 2023-09-19 DIAGNOSIS — R29810 Facial weakness: Secondary | ICD-10-CM | POA: Insufficient documentation

## 2023-09-19 NOTE — Assessment & Plan Note (Signed)
Controlled, no change in medication DASH diet and commitment to daily physical activity for a minimum of 30 minutes discussed and encouraged, as a part of hypertension management. The importance of attaining a healthy weight is also discussed.     08/31/2023    3:58 PM 08/30/2023   10:36 AM 08/30/2023   10:31 AM 08/30/2023    7:51 AM 08/22/2023    1:23 PM 08/12/2023    3:58 PM 06/01/2023    2:30 PM  BP/Weight  Systolic BP 118 121 84 135 114 114 110  Diastolic BP 76 83 53 69 72 72 80  Wt. (Lbs) 291.04   292.33 292.37 292.38 296.2  BMI 53.23 kg/m2   53.47 kg/m2 53.48 kg/m2 53.48 kg/m2 54.18 kg/m2

## 2023-09-19 NOTE — Assessment & Plan Note (Signed)
Has localizewd hair loss , may repreesent tinea , will refer Derm for eval

## 2023-09-19 NOTE — Assessment & Plan Note (Addendum)
Unchanged  Patient educated about the importance of limiting  Carbohydrate intake , the need to commit to daily physical activity for a minimum of 30 minutes , and to commit weight loss. The fact that changes in all these areas will reduce or eliminate all together the development of diabetes is stressed.      Latest Ref Rng & Units 09/02/2023    8:10 AM 08/22/2023    1:26 PM 03/17/2023    8:55 AM 03/04/2023    2:00 PM 08/18/2022    1:24 PM  Diabetic Labs  HbA1c 4.8 - 5.6 % 6.4   6.4     Chol 100 - 199 mg/dL 409   811     HDL >91 mg/dL 35   35     Calc LDL 0 - 99 mg/dL 80   478     Triglycerides 0 - 149 mg/dL 84   295     Creatinine 0.44 - 1.00 mg/dL  6.21   3.08  6.57       08/31/2023    3:58 PM 08/30/2023   10:36 AM 08/30/2023   10:31 AM 08/30/2023    7:51 AM 08/22/2023    1:23 PM 08/12/2023    3:58 PM 06/01/2023    2:30 PM  BP/Weight  Systolic BP 118 121 84 135 114 114 110  Diastolic BP 76 83 53 69 72 72 80  Wt. (Lbs) 291.04   292.33 292.37 292.38 296.2  BMI 53.23 kg/m2   53.47 kg/m2 53.48 kg/m2 53.48 kg/m2 54.18 kg/m2       No data to display

## 2023-09-19 NOTE — Assessment & Plan Note (Signed)
Unchanged  Patient re-educated about  the importance of commitment to a  minimum of 150 minutes of exercise per week as able.  The importance of healthy food choices with portion control discussed, as well as eating regularly and within a 12 hour window most days. The need to choose "clean , green" food 50 to 75% of the time is discussed, as well as to make water the primary drink and set a goal of 64 ounces water daily.       08/31/2023    3:58 PM 08/30/2023    7:51 AM 08/22/2023    1:23 PM  Weight /BMI  Weight 291 lb 0.6 oz 292 lb 5.3 oz 292 lb 6 oz  Height 5\' 2"  (1.575 m) 5\' 2"  (1.575 m) 5\' 2"  (1.575 m)  BMI 53.23 kg/m2 53.47 kg/m2 53.48 kg/m2

## 2023-09-19 NOTE — Assessment & Plan Note (Signed)
Corrected vcontinue supplement

## 2023-09-19 NOTE — Assessment & Plan Note (Signed)
Ongoing hair loss for past over 3 months, refer dermatology

## 2023-09-19 NOTE — Assessment & Plan Note (Signed)
Reports 18 month h/o intermittent right facial numbness and weaknesss, worse in apst 3 monhts, needs brain scan and Neuro eval

## 2023-09-26 ENCOUNTER — Ambulatory Visit (HOSPITAL_COMMUNITY)
Admission: RE | Admit: 2023-09-26 | Discharge: 2023-09-26 | Disposition: A | Discharge status: Home / Self Care | Payer: BC Managed Care – PPO | Source: Ambulatory Visit | Attending: Family Medicine | Admitting: Family Medicine

## 2023-09-26 DIAGNOSIS — G459 Transient cerebral ischemic attack, unspecified: Secondary | ICD-10-CM | POA: Insufficient documentation

## 2023-09-26 DIAGNOSIS — R200 Anesthesia of skin: Secondary | ICD-10-CM | POA: Insufficient documentation

## 2023-09-26 DIAGNOSIS — R29810 Facial weakness: Secondary | ICD-10-CM | POA: Insufficient documentation

## 2023-09-29 ENCOUNTER — Ambulatory Visit (INDEPENDENT_AMBULATORY_CARE_PROVIDER_SITE_OTHER): Payer: BC Managed Care – PPO | Admitting: Gastroenterology

## 2023-10-04 ENCOUNTER — Encounter (HOSPITAL_COMMUNITY): Payer: Self-pay | Admitting: Orthopedic Surgery

## 2023-10-04 ENCOUNTER — Other Ambulatory Visit: Payer: Self-pay

## 2023-10-04 NOTE — Progress Notes (Signed)
 SDW CALL  Patient was given pre-op instructions over the phone. The opportunity was given for the patient to ask questions. No further questions asked. Patient verbalized understanding of instructions given.   PCP - Rollene Pesa Cardiologist - denies     -Dr. Mallipeddi on 05/12/23 new RBBB but did not need any follow-up  PPM/ICD - denies Device Orders - n/a Rep Notified - n/a  Chest x-ray - denies EKG - 03/04/23 Stress Test - denies ECHO - denies Cardiac Cath - denies  Sleep Study - denies CPAP - n/a  Fasting Blood Sugar - does not check blood sugar at home Pre-diabetic according to patient - A1C collected in December 2024  Last dose of GLP1 agonist-  n/a GLP1 instructions: n/a  Blood Thinner Instructions: n/a Aspirin Instructions:n/a  ERAS Protcol - clears until 1330   COVID TEST- n/a   Anesthesia review:  no   Patient denies shortness of breath, fever, cough and chest pain over the phone call   All instructions explained to the patient, with a verbal understanding of the material. Patient agrees to go over the instructions while at home for a better understanding.

## 2023-10-05 ENCOUNTER — Emergency Department (HOSPITAL_COMMUNITY)
Admission: EM | Admit: 2023-10-05 | Discharge: 2023-10-06 | Disposition: A | Discharge status: Home / Self Care | Payer: 59 | Source: Home / Self Care | Attending: Emergency Medicine | Admitting: Emergency Medicine

## 2023-10-05 ENCOUNTER — Encounter (HOSPITAL_COMMUNITY)
Admission: RE | Disposition: A | Discharge status: Home / Self Care | Payer: Self-pay | Source: Home / Self Care | Attending: Orthopedic Surgery

## 2023-10-05 ENCOUNTER — Ambulatory Visit (HOSPITAL_COMMUNITY)
Admission: RE | Admit: 2023-10-05 | Discharge: 2023-10-05 | Disposition: A | Discharge status: Home / Self Care | Payer: 59 | Attending: Orthopedic Surgery | Admitting: Orthopedic Surgery

## 2023-10-05 ENCOUNTER — Ambulatory Visit (HOSPITAL_COMMUNITY): Payer: 59

## 2023-10-05 ENCOUNTER — Emergency Department (HOSPITAL_COMMUNITY): Payer: 59

## 2023-10-05 ENCOUNTER — Encounter (HOSPITAL_COMMUNITY): Payer: Self-pay | Admitting: Orthopedic Surgery

## 2023-10-05 ENCOUNTER — Other Ambulatory Visit: Payer: Self-pay

## 2023-10-05 DIAGNOSIS — M65842 Other synovitis and tenosynovitis, left hand: Secondary | ICD-10-CM | POA: Insufficient documentation

## 2023-10-05 DIAGNOSIS — I10 Essential (primary) hypertension: Secondary | ICD-10-CM

## 2023-10-05 DIAGNOSIS — M65342 Trigger finger, left ring finger: Secondary | ICD-10-CM

## 2023-10-05 DIAGNOSIS — R0602 Shortness of breath: Secondary | ICD-10-CM | POA: Diagnosis not present

## 2023-10-05 DIAGNOSIS — Z79899 Other long term (current) drug therapy: Secondary | ICD-10-CM | POA: Insufficient documentation

## 2023-10-05 DIAGNOSIS — J45909 Unspecified asthma, uncomplicated: Secondary | ICD-10-CM | POA: Diagnosis not present

## 2023-10-05 DIAGNOSIS — F32A Depression, unspecified: Secondary | ICD-10-CM | POA: Insufficient documentation

## 2023-10-05 DIAGNOSIS — E785 Hyperlipidemia, unspecified: Secondary | ICD-10-CM

## 2023-10-05 DIAGNOSIS — F418 Other specified anxiety disorders: Secondary | ICD-10-CM

## 2023-10-05 DIAGNOSIS — E669 Obesity, unspecified: Secondary | ICD-10-CM | POA: Insufficient documentation

## 2023-10-05 DIAGNOSIS — K219 Gastro-esophageal reflux disease without esophagitis: Secondary | ICD-10-CM | POA: Insufficient documentation

## 2023-10-05 DIAGNOSIS — F419 Anxiety disorder, unspecified: Secondary | ICD-10-CM | POA: Insufficient documentation

## 2023-10-05 HISTORY — DX: Unspecified asthma, uncomplicated: J45.909

## 2023-10-05 HISTORY — PX: TRIGGER FINGER RELEASE: SHX641

## 2023-10-05 HISTORY — DX: Prediabetes: R73.03

## 2023-10-05 LAB — BASIC METABOLIC PANEL
Anion gap: 9 (ref 5–15)
BUN: 8 mg/dL (ref 6–20)
CO2: 18 mmol/L — ABNORMAL LOW (ref 22–32)
Calcium: 8.8 mg/dL — ABNORMAL LOW (ref 8.9–10.3)
Chloride: 110 mmol/L (ref 98–111)
Creatinine, Ser: 0.74 mg/dL (ref 0.44–1.00)
GFR, Estimated: >60 mL/min (ref >60)
Glucose, Bld: 216 mg/dL — ABNORMAL HIGH (ref 70–99)
Potassium: 4 mmol/L (ref 3.5–5.1)
Sodium: 137 mmol/L (ref 135–145)

## 2023-10-05 LAB — CBC WITH DIFFERENTIAL/PLATELET
Abs Immature Granulocytes: 0.04 10*3/uL (ref 0.00–0.07)
Basophils Absolute: 0.1 10*3/uL (ref 0.0–0.1)
Basophils Relative: 1 %
Eosinophils Absolute: 0.3 10*3/uL (ref 0.0–0.5)
Eosinophils Relative: 3 %
HCT: 43.5 % (ref 36.0–46.0)
Hemoglobin: 13.9 g/dL (ref 12.0–15.0)
Immature Granulocytes: 0 %
Lymphocytes Relative: 75 %
Lymphs Abs: 7.6 10*3/uL — ABNORMAL HIGH (ref 0.7–4.0)
MCH: 23.1 pg — ABNORMAL LOW (ref 26.0–34.0)
MCHC: 32 g/dL (ref 30.0–36.0)
MCV: 72.1 fL — ABNORMAL LOW (ref 80.0–100.0)
Monocytes Absolute: 0.5 10*3/uL (ref 0.1–1.0)
Monocytes Relative: 5 %
Neutro Abs: 1.6 10*3/uL — ABNORMAL LOW (ref 1.7–7.7)
Neutrophils Relative %: 16 %
Platelets: 191 10*3/uL (ref 150–400)
RBC: 6.03 MIL/uL — ABNORMAL HIGH (ref 3.87–5.11)
RDW: 17.4 % — ABNORMAL HIGH (ref 11.5–15.5)
Smear Review: NORMAL
WBC: 10.1 10*3/uL (ref 4.0–10.5)
nRBC: 0 % (ref 0.0–0.2)

## 2023-10-05 LAB — COMPREHENSIVE METABOLIC PANEL
ALT: 43 U/L (ref 0–44)
AST: 79 U/L — ABNORMAL HIGH (ref 15–41)
Albumin: 3.2 g/dL — ABNORMAL LOW (ref 3.5–5.0)
Alkaline Phosphatase: 135 U/L — ABNORMAL HIGH (ref 38–126)
Anion gap: 9 (ref 5–15)
BUN: 7 mg/dL (ref 6–20)
CO2: 20 mmol/L — ABNORMAL LOW (ref 22–32)
Calcium: 9 mg/dL (ref 8.9–10.3)
Chloride: 108 mmol/L (ref 98–111)
Creatinine, Ser: 0.71 mg/dL (ref 0.44–1.00)
GFR, Estimated: >60 mL/min (ref >60)
Glucose, Bld: 89 mg/dL (ref 70–99)
Potassium: 4.1 mmol/L (ref 3.5–5.1)
Sodium: 137 mmol/L (ref 135–145)
Total Bilirubin: 1.2 mg/dL (ref 0.0–1.2)
Total Protein: 7.6 g/dL (ref 6.5–8.1)

## 2023-10-05 LAB — CBC
HCT: 45.1 % (ref 36.0–46.0)
Hemoglobin: 14.2 g/dL (ref 12.0–15.0)
MCH: 23.1 pg — ABNORMAL LOW (ref 26.0–34.0)
MCHC: 31.5 g/dL (ref 30.0–36.0)
MCV: 73.5 fL — ABNORMAL LOW (ref 80.0–100.0)
Platelets: 176 10*3/uL (ref 150–400)
RBC: 6.14 MIL/uL — ABNORMAL HIGH (ref 3.87–5.11)
RDW: 17.8 % — ABNORMAL HIGH (ref 11.5–15.5)
WBC: 7.6 10*3/uL (ref 4.0–10.5)
nRBC: 0 % (ref 0.0–0.2)

## 2023-10-05 LAB — MAGNESIUM: Magnesium: 1.7 mg/dL (ref 1.7–2.4)

## 2023-10-05 LAB — TROPONIN I (HIGH SENSITIVITY)
Troponin I (High Sensitivity): 5 ng/L (ref <18)
Troponin I (High Sensitivity): 4 ng/L (ref <18)

## 2023-10-05 LAB — BRAIN NATRIURETIC PEPTIDE: B Natriuretic Peptide: 15 pg/mL (ref 0.0–100.0)

## 2023-10-05 SURGERY — RELEASE, A1 PULLEY, FOR TRIGGER FINGER
Anesthesia: Monitor Anesthesia Care | Site: Ring Finger | Laterality: Left

## 2023-10-05 MED ORDER — OXYCODONE HCL 5 MG/5ML PO SOLN: 5.0000 mg | Freq: Once | ORAL | Status: DC | PRN

## 2023-10-05 MED ORDER — LACTATED RINGERS IV SOLN: INTRAVENOUS | Status: DC

## 2023-10-05 MED ORDER — OXYCODONE HCL 5 MG PO TABS
5.0000 mg | ORAL_TABLET | Freq: Once | ORAL | Status: DC | PRN
Start: 2023-10-05 — End: 2023-10-05

## 2023-10-05 MED ORDER — FENTANYL CITRATE (PF) 250 MCG/5ML IJ SOLN
INTRAMUSCULAR | Status: AC
  Filled 2023-10-05: qty 5

## 2023-10-05 MED ORDER — LIDOCAINE HCL (PF) 1 % IJ SOLN
INTRAMUSCULAR | Status: AC
  Filled 2023-10-05: qty 30

## 2023-10-05 MED ORDER — MIDAZOLAM HCL 2 MG/2ML IJ SOLN
INTRAMUSCULAR | Status: AC
  Filled 2023-10-05: qty 2

## 2023-10-05 MED ORDER — FENTANYL CITRATE (PF) 100 MCG/2ML IJ SOLN: 25.0000 ug | INTRAMUSCULAR | Status: DC | PRN

## 2023-10-05 MED ORDER — PROPOFOL 500 MG/50ML IV EMUL
INTRAVENOUS | Status: DC | PRN
  Administered 2023-10-05: 25 ug/kg/min via INTRAVENOUS

## 2023-10-05 MED ORDER — FENTANYL CITRATE (PF) 250 MCG/5ML IJ SOLN
INTRAMUSCULAR | Status: DC | PRN
  Administered 2023-10-05: 50 ug via INTRAVENOUS

## 2023-10-05 MED ORDER — ORAL CARE MOUTH RINSE: 15.0000 mL | Freq: Once | OROMUCOSAL | Status: DC

## 2023-10-05 MED ORDER — DROPERIDOL 2.5 MG/ML IJ SOLN
0.6250 mg | Freq: Once | INTRAMUSCULAR | Status: AC | PRN
  Administered 2023-10-05: 0.625 mg via INTRAVENOUS

## 2023-10-05 MED ORDER — CHLORHEXIDINE GLUCONATE 0.12 % MT SOLN
15.0000 mL | OROMUCOSAL | Status: AC
  Administered 2023-10-05: 15 mL via OROMUCOSAL
  Filled 2023-10-05 (×2): qty 15

## 2023-10-05 MED ORDER — PROPOFOL 10 MG/ML IV BOLUS
INTRAVENOUS | Status: DC | PRN
  Administered 2023-10-05: 80 mg via INTRAVENOUS

## 2023-10-05 MED ORDER — DROPERIDOL 2.5 MG/ML IJ SOLN
INTRAMUSCULAR | Status: AC
  Filled 2023-10-05: qty 2

## 2023-10-05 MED ORDER — OXYCODONE HCL 5 MG PO TABS: 5.0000 mg | ORAL_TABLET | Freq: Four times a day (QID) | ORAL | 0 refills | Status: AC | PRN

## 2023-10-05 MED ORDER — BUPIVACAINE HCL (PF) 0.25 % IJ SOLN
INTRAMUSCULAR | Status: AC
Start: 2023-10-05 — End: ?
  Filled 2023-10-05: qty 30

## 2023-10-05 MED ORDER — LIDOCAINE HCL (PF) 1 % IJ SOLN
INTRAMUSCULAR | Status: DC | PRN
  Administered 2023-10-05: 6 mL

## 2023-10-05 MED ORDER — SODIUM CHLORIDE 0.9 % IV SOLN
3.0000 g | INTRAVENOUS | Status: AC
  Administered 2023-10-05: 3 g via INTRAVENOUS
  Filled 2023-10-05: qty 3

## 2023-10-05 MED ORDER — MIDAZOLAM HCL 2 MG/2ML IJ SOLN
INTRAMUSCULAR | Status: DC | PRN
  Administered 2023-10-05: 2 mg via INTRAVENOUS

## 2023-10-05 MED ORDER — SODIUM CHLORIDE 0.9 % IV SOLN: INTRAVENOUS | Status: DC

## 2023-10-05 MED ORDER — ACETAMINOPHEN 10 MG/ML IV SOLN
1000.0000 mg | Freq: Once | INTRAVENOUS | Status: DC | PRN
Start: 2023-10-05 — End: 2023-10-05

## 2023-10-05 MED ORDER — CHLORHEXIDINE GLUCONATE 0.12 % MT SOLN: 15.0000 mL | Freq: Once | OROMUCOSAL | Status: DC

## 2023-10-05 SURGICAL SUPPLY — 33 items
BAG COUNTER SPONGE SURGICOUNT (BAG) ×1 IMPLANT
BANDAGE ESMARK 6X9 LF (GAUZE/BANDAGES/DRESSINGS) IMPLANT
BNDG ELASTIC 3INX 5YD STR LF (GAUZE/BANDAGES/DRESSINGS) IMPLANT
BNDG ELASTIC 4X5.8 VLCR STR LF (GAUZE/BANDAGES/DRESSINGS) IMPLANT
BNDG ESMARK 4X9 LF (GAUZE/BANDAGES/DRESSINGS) ×1 IMPLANT
BNDG ESMARK 6X9 LF (GAUZE/BANDAGES/DRESSINGS) ×1
BNDG GAUZE DERMACEA FLUFF 4 (GAUZE/BANDAGES/DRESSINGS) IMPLANT
CORD BIPOLAR FORCEPS 12FT (ELECTRODE) ×1 IMPLANT
COVER SURGICAL LIGHT HANDLE (MISCELLANEOUS) ×1 IMPLANT
CUFF TOURN SGL QUICK 18X4 (TOURNIQUET CUFF) ×1 IMPLANT
CUFF TRNQT CYL 24X4X16.5-23 (TOURNIQUET CUFF) IMPLANT
DRAPE SURG 17X23 STRL (DRAPES) ×1 IMPLANT
DRSG XEROFORM 1X8 (GAUZE/BANDAGES/DRESSINGS) IMPLANT
GAUZE SPONGE 4X4 12PLY STRL (GAUZE/BANDAGES/DRESSINGS) IMPLANT
GAUZE XEROFORM 1X8 LF (GAUZE/BANDAGES/DRESSINGS) IMPLANT
GLOVE BIOGEL PI IND STRL 8.5 (GLOVE) ×1 IMPLANT
GLOVE SURG ORTHO 8.0 STRL STRW (GLOVE) ×1 IMPLANT
GOWN STRL REUS W/ TWL LRG LVL3 (GOWN DISPOSABLE) ×2 IMPLANT
GOWN STRL REUS W/ TWL XL LVL3 (GOWN DISPOSABLE) ×1 IMPLANT
KIT BASIN OR (CUSTOM PROCEDURE TRAY) ×1 IMPLANT
KIT TURNOVER KIT B (KITS) ×1 IMPLANT
NS IRRIG 1000ML POUR BTL (IV SOLUTION) ×1 IMPLANT
PACK ORTHO EXTREMITY (CUSTOM PROCEDURE TRAY) ×1 IMPLANT
PAD ARMBOARD 7.5X6 YLW CONV (MISCELLANEOUS) ×2 IMPLANT
PAD CAST 4YDX4 CTTN HI CHSV (CAST SUPPLIES) IMPLANT
SOAP 2 % CHG 4 OZ (WOUND CARE) ×1 IMPLANT
SUCTION TUBE FRAZIER 10FR DISP (SUCTIONS) IMPLANT
SUT ETHILON 4 0 PS 2 18 (SUTURE) IMPLANT
TOWEL GREEN STERILE (TOWEL DISPOSABLE) ×1 IMPLANT
TOWEL GREEN STERILE FF (TOWEL DISPOSABLE) ×1 IMPLANT
TUBE CONNECTING 12X1/4 (SUCTIONS) IMPLANT
UNDERPAD 30X36 HEAVY ABSORB (UNDERPADS AND DIAPERS) ×1 IMPLANT
WATER STERILE IRR 1000ML POUR (IV SOLUTION) ×1 IMPLANT

## 2023-10-05 NOTE — Transfer of Care (Signed)
 Immediate Anesthesia Transfer of Care Note  Patient: Holly Alvarado  Procedure(s) Performed: RELEASE ring TRIGGER FINGER/A-1 PULLEY (Left: Ring Finger)  Patient Location: PACU  Anesthesia Type:MAC  Level of Consciousness: awake, alert , and oriented  Airway & Oxygen Therapy: Patient Spontanous Breathing  Post-op Assessment: Report given to RN and Post -op Vital signs reviewed and stable  Post vital signs: Reviewed and stable  Last Vitals:  Vitals Value Taken Time  BP 105/72 10/05/23 1749  Temp    Pulse 70 10/05/23 1751  Resp 14 10/05/23 1751  SpO2 96 % 10/05/23 1751  Vitals shown include unfiled device data.  Last Pain:  Vitals:   10/05/23 1428  TempSrc:   PainSc: 0-No pain         Complications: No notable events documented.

## 2023-10-05 NOTE — H&P (Signed)
 HAND SURGERY   HPI: Patient is a 58 y.o. female who presents with left ring finger stenosing tenosynovitis that has failed conservative management. She presents today for open left ring finger A1 pulley release.  Patient denies any changes to their medical history or new systemic symptoms today.    Past Medical History:  Diagnosis Date   Allergic rhinitis, seasonal    Anemia    hx of    Anxiety    hx of panic attacks and panic attacks with surgery    Anxiety disorder    Asthma    Beta thalassemia trait    Chronic constipation    Complication of anesthesia    woke up during surgery of tubal ligation    Esophageal polyp    Esophageal varices (HCC)    Fatty liver    GERD (gastroesophageal reflux disease)    Hemorrhoids    Hypertension    Obesity    Pre-diabetes    Past Surgical History:  Procedure Laterality Date   BIOPSY  05/21/2022   Procedure: BIOPSY;  Surgeon: Eartha Angelia Sieving, MD;  Location: [facility];  Service: Gastroenterology;;   BLT  09/27/1985   BREAST BIOPSY Right 09/27/2008   benign   BREAST BIOPSY Left 10/07/2022   US  LT BREAST BX W LOC DEV 1ST LESION IMG BX SPEC US  GUIDE 10/07/2022 [facility]   BREAST BIOPSY  03/07/2023   MM LT RADIOACTIVE SEED LOC MAMMO GUIDE 03/07/2023 [facility]   BREATH TEK H PYLORI N/A 02/25/2014   Procedure: BREATH TEK H PYLORI;  Surgeon: Donnice KATHEE Lunger, MD;  Location: [facility];  Service: General;  Laterality: N/A;   CHOLECYSTECTOMY  09/28/1995   COLONOSCOPY  12/30/2010   COLONOSCOPY N/A 11/23/2017   Procedure: COLONOSCOPY;  Surgeon: Golda Claudis PENNER, MD;  Location: [facility];  Service: Endoscopy;  Laterality: N/A;  1130   COLONOSCOPY WITH PROPOFOL  N/A 08/30/2023   Procedure: COLONOSCOPY WITH PROPOFOL ;  Surgeon: Eartha Angelia Sieving, MD;  Location: [facility];  Service: Gastroenterology;  Laterality: N/A;  9:15AM;ASA 3   ECTOPIC PREGNANCY SURGERY  1994, 1995   ESOPHAGOGASTRODUODENOSCOPY  (EGD) WITH PROPOFOL  N/A 05/21/2022   Procedure: ESOPHAGOGASTRODUODENOSCOPY (EGD) WITH PROPOFOL ;  Surgeon: Eartha Angelia Sieving, MD;  Location: [facility];  Service: Gastroenterology;  Laterality: N/A;  900 ASA 2   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   Procedure: LAPAROSCOPIC ROUX-EN-Y GASTRIC BYPASS WITH UPPER ENDOSCOPY;  Surgeon: Donnice KATHEE Lunger, MD;  Location: [facility];  Service: General;  Laterality: N/A;   MOUTH SURGERY     wisdom tooth extraction   PARTIAL HYSTERECTOMY     POLYPECTOMY  08/30/2023   Procedure: POLYPECTOMY INTESTINAL;  Surgeon: Eartha Angelia Sieving, MD;  Location: [facility];  Service: Gastroenterology;;   RADIOACTIVE SEED GUIDED EXCISIONAL BREAST BIOPSY Left 03/08/2023   Procedure: RADIOACTIVE SEED GUIDED EXCISIONAL LEFT BREAST BIOPSY;  Surgeon: Ebbie Donnice, MD;  Location: [facility];  Service: General;  Laterality: Left;   TUBAL LIGATION     UPPER GASTROINTESTINAL ENDOSCOPY  12/30/2010   UPPER GASTROINTESTINAL ENDOSCOPY  05/11/2004   UPPER GASTROINTESTINAL ENDOSCOPY  04/30/1997   JERYL   Social History   Socioeconomic History   Marital status: Married    Spouse name: Not on file   Number of children: 2   Years of education: college   Highest education level: Not on file  Occupational History   Occupation: Designer, jewellery and teacher   Tobacco Use   Smoking status: Never  Passive exposure: Never   Smokeless tobacco: Never  Vaping Use   Vaping status: Never Used  Substance and Sexual Activity   Alcohol use: No    Alcohol/week: 0.0 standard drinks of alcohol   Drug use: No   Sexual activity: Yes    Birth control/protection: Surgical  Other Topics Concern   Not on file  Social History Narrative   Not on file   Social Drivers of Health   Financial Resource Strain: Low Risk  (01/06/2022)   Overall Financial Resource Strain (CARDIA)    Difficulty of Paying Living Expenses: Not very hard  Food Insecurity: No Food Insecurity (01/06/2022)    Hunger Vital Sign    Worried About Running Out of Food in the Last Year: Never true    Ran Out of Food in the Last Year: Never true  Transportation Needs: No Transportation Needs (01/06/2022)   PRAPARE - Administrator, Civil Service (Medical): No    Lack of Transportation (Non-Medical): No  Physical Activity: Not on file  Stress: Stress Concern Present (01/06/2022)   [employer] of Occupational Health - Occupational Stress Questionnaire    Feeling of Stress : Rather much  Social Connections: Not on file   Family History  Problem Relation Age of Onset   Asthma Mother    Angioedema Mother    Hypertension Mother    COPD Mother    Depression Mother    Hyperlipidemia Father    Hypertension Father    Depression Father    Alcohol abuse Father    Drug abuse Father    Hyperlipidemia Sister    Hypertension Sister    Hyperlipidemia Sister    Hypertension Sister    Colon cancer Maternal Aunt    Colon cancer Maternal Aunt    Colon cancer Maternal Aunt    Colon cancer Maternal Aunt    Colon cancer Maternal Aunt    Colon cancer Maternal Aunt    Schizophrenia Maternal Uncle    Diabetes Other        family history    Asthma Other        family history    Arthritis Other        family history    Stroke Other    Cancer Other    Heart disease Other    - negative except otherwise stated in the family history section Allergies  Allergen Reactions   Codeine     Severe headache.     Sulfa Antibiotics Hives   Tape Rash    Foam tape - caused blisters and severe itching   Aspirin     Due to Gastric Procedure   Miconazole Swelling   Sulfonamide Derivatives Hives and Itching   Prior to Admission medications   Medication Sig Start Date End Date Taking? Authorizing Provider  acetaminophen  (TYLENOL ) 500 MG tablet Take 500-1,000 mg by mouth every 6 (six) hours as needed for moderate pain.   Yes [provider]  alum & mag hydroxide-simeth (MAALOX/MYLANTA)  200-200-20 MG/5ML suspension Take 15-30 mLs by mouth every 6 (six) hours as needed for indigestion or heartburn.   Yes [provider]  amLODipine  (NORVASC ) 2.5 MG tablet TAKE 1 TABLET BY MOUTH EVERY DAY 03/24/23  Yes Antonetta Rollene BRAVO, MD  carvedilol  (COREG ) 6.25 MG tablet Take 6.25 mg by mouth 2 (two) times daily with a meal.   Yes [provider]  esomeprazole  (NEXIUM ) 20 MG capsule Take 20 mg by mouth 2 (two) times daily  as needed (heartburn).   Yes [provider]  fluticasone  (FLONASE ) 50 MCG/ACT nasal spray Place 2 sprays into both nostrils daily. Patient taking differently: Place 2 sprays into both nostrils daily as needed for allergies. 03/16/23  Yes Antonetta Rollene BRAVO, MD  levocetirizine (XYZAL ) 5 MG tablet Take 5 mg by mouth every evening.   Yes [provider]  venlafaxine  XR (EFFEXOR  XR) 150 MG 24 hr capsule Take 1 capsule (150 mg total) by mouth daily with breakfast. 09/29/22  Yes Antonetta Rollene BRAVO, MD  Vitamin D , Ergocalciferol , (DRISDOL ) 1.25 MG (50000 UNIT) CAPS capsule TAKE 1 CAPSULE (50,000 UNITS TOTAL) BY MOUTH ONCE A WEEK. ONE CAPSULE ONCE WEEKLY 06/20/23  Yes Antonetta Rollene BRAVO, MD  diphenhydrAMINE (BENADRYL) 25 MG tablet Take 25 mg by mouth daily as needed for itching.    [provider]  Fluocinolone  Acetonide Body (DERMA-SMOOTHE /FS BODY) 0.01 % OIL Use one application 3-5 times weekly to control scalp inflammation. 06/01/23   Iva Marty Saltness, MD  montelukast  (SINGULAIR ) 10 MG tablet Take 1 tablet (10 mg total) by mouth at bedtime. 06/01/23 08/30/23  Iva Marty Saltness, MD  Olopatadine-Mometasone  (RYALTRIS ) [phone] MCG/ACT SUSP Place 2 sprays into the nose 2 (two) times daily as needed. 06/01/23   Iva Marty Saltness, MD  polyethylene glycol (MIRALAX / GLYCOLAX) 17 g packet Take 17 g by mouth daily as needed for moderate constipation.    [provider]  Propylene Glycol (SYSTANE BALANCE) 0.6 % SOLN Place 1 drop into both  eyes as needed.    [provider]   No results found. - Positive ROS: All other systems have been reviewed and were otherwise negative with the exception of those mentioned in the HPI and as above.  Physical Exam: General: No acute distress, resting comfortably Cardiovascular: BUE warm and well perfused, normal rate Respiratory: Normal WOB on RA Skin: Warm and dry Neurologic: Sensation intact distally Psychiatric: Patient is at baseline mood and affect  Left Upper Extremity  TTP over ring finger A1 pulley with palpable locking and catching.  She has full and painless AROM of remaining fingers.  SILT m/u/r distribution.  Hand warm and well perfused w/ BCR.    Assessment: 58 yo F w/ left ring finger stenosing tenosynovitis.   Plan: OR today for left ring finger A1 pulley release. We again reviewed the risks of surgery which include bleeding, infection, damage to neurovascular structures, persistent symptoms, finger stiffness, delayed wound healing, need for additional surgery.  Informed consent was signed.  All questions were answered.   Bebe Galla, M.D. [facility] 3:25 PM

## 2023-10-05 NOTE — Anesthesia Preprocedure Evaluation (Addendum)
 Anesthesia Evaluation  Patient identified by MRN, date of birth, ID band Patient awake    Reviewed: Allergy  & Precautions, H&P , NPO status , Patient's Chart, lab work & pertinent test results  Airway Mallampati: II  TM Distance: >3 FB Neck ROM: Full    Dental no notable dental hx.    Pulmonary asthma    Pulmonary exam normal breath sounds clear to auscultation       Cardiovascular hypertension, Normal cardiovascular exam Rhythm:Regular Rate:Normal     Neuro/Psych  PSYCHIATRIC DISORDERS Anxiety Depression    negative neurological ROS     GI/Hepatic Neg liver ROS,GERD  ,,Esophageal varices   Endo/Other  negative endocrine ROS    Renal/GU negative Renal ROS  negative genitourinary   Musculoskeletal negative musculoskeletal ROS (+)    Abdominal   Peds negative pediatric ROS (+)  Hematology  (+) Blood dyscrasia, anemia   Anesthesia Other Findings   Reproductive/Obstetrics negative OB ROS                             Anesthesia Physical Anesthesia Plan  ASA: 3  Anesthesia Plan: MAC   Post-op Pain Management:    Induction: Intravenous  PONV Risk Score and Plan: Propofol  infusion and Treatment may vary due to age or medical condition  Airway Management Planned: Natural Airway  Additional Equipment:   Intra-op Plan:   Post-operative Plan:   Informed Consent: I have reviewed the patients History and Physical, chart, labs and discussed the procedure including the risks, benefits and alternatives for the proposed anesthesia with the patient or authorized representative who has indicated his/her understanding and acceptance.     Dental advisory given  Plan Discussed with: CRNA  Anesthesia Plan Comments:         Anesthesia Quick Evaluation

## 2023-10-05 NOTE — Interval H&P Note (Signed)
 History and Physical Interval Note:  10/05/2023 3:27 PM  Holly Alvarado  has presented today for surgery, with the diagnosis of Left ring trigger finger.  The various methods of treatment have been discussed with the patient and family. After consideration of risks, benefits and other options for treatment, the patient has consented to  Procedure(s) with comments: RELEASE ring TRIGGER FINGER/A-1 PULLEY (Left) - local as a surgical intervention.  The patient's history has been reviewed, patient examined, no change in status, stable for surgery.  I have reviewed the patient's chart and labs.  Questions were answered to the patient's satisfaction.     Holly Alvarado

## 2023-10-05 NOTE — Discharge Instructions (Signed)
 Waylan Rocher, M.D. Hand Surgery  POST-OPERATIVE DISCHARGE INSTRUCTIONS   PRESCRIPTIONS: - You may have been given a prescription to be taken as directed for post-operative pain control.  You may also take over the counter ibuprofen/aleve and tylenol for pain. Take this as directed on the packaging. Do not exceed 3000 mg tylenol/acetaminophen in 24 hours.  Ibuprofen 600-800 mg (3-4) tablets by mouth every 6 hours as needed for pain.   OR  Aleve 2 tablets by mouth every 12 hours (twice daily) as needed for pain.   AND/OR  Tylenol 1000 mg (2 tablets) every 8 hours as needed for pain.  - Please use your pain medication carefully, as refills are limited and you may not be provided with one.  As stated above, please use over the counter pain medicine - it will also be helpful with decreasing your swelling.    ANESTHESIA: -After your surgery, post-surgical discomfort or pain is likely. This discomfort can last several days to a few weeks. At certain times of the day your discomfort may be more intense.   Did you receive a nerve block?   - A nerve block can provide pain relief for one hour to two days after your surgery. As long as the nerve block is working, you will experience little or no sensation in the area the surgeon operated on.  - As the nerve block wears off, you will begin to experience pain or discomfort. It is very important that you begin taking your prescribed pain medication before the nerve block fully wears off. Treating your pain at the first sign of the block wearing off will ensure your pain is better controlled and more tolerable when full-sensation returns. Do not wait until the pain is intolerable, as the medicine will be less effective. It is better to treat pain in advance than to try and catch up.   General Anesthesia:  If you did not receive a nerve block during your surgery, you will need to start taking your pain medication shortly after your surgery and  should continue to do so as prescribed by your surgeon.     ICE AND ELEVATION: - You may use ice for the first 48-72 hours, but it is not critical.   - Motion of your fingers is very important to decrease the swelling.  - Elevation, as much as possible for the next 48 hours, is critical for decreasing swelling as well as for pain relief. Elevation means when you are seated or lying down, you hand should be at or above your heart. When walking, the hand needs to be at or above the level of your elbow.  - If the bandage gets too tight, it may need to be loosened. Please contact our office and we will instruct you in how to do this.    SURGICAL BANDAGES:  - Keep your dressing and/or splint clean and dry at all times.  You can remove your dressing 7 days from now and change with a dry dressing or Band-Aids as needed thereafter. - You may place a plastic bag over your bandage to shower, but be careful, do not get your bandages wet.  - After the bandages have been removed, it is OK to get the stitches wet in a shower or with hand washing. Do Not soak or submerge the wound yet. Please do not use lotions or creams on the stitches.      HAND THERAPY:  - You may not need any. If you  do, we will begin this at your follow up visit in the clinic.    ACTIVITY AND WORK: - You are encouraged to move any fingers which are not in the bandage.  - Light use of the fingers is allowed to assist the other hand with daily hygiene and eating, but strong gripping or lifting is often uncomfortable and should be avoided.  - You might miss a variable period of time from work and hopefully this issue has been discussed prior to surgery. You may not do any heavy work with your affected hand for about 2 weeks.    [facility] Second Floor, [address] [place], [place] [address] [phone]

## 2023-10-05 NOTE — ED Provider Triage Note (Signed)
 Emergency Medicine Provider Triage Evaluation Note  Holly Alvarado , a 58 y.o. female  was evaluated in triage.  Pt complains of dyspnea, anxiety since following surgery this afternoon.  Denies chest pain.  Review of Systems  Positive: As above Negative: As above  Physical Exam  BP 132/84 (BP Location: Right Arm)   Pulse (!) 113   Temp 97.7 F (36.5 C)   Resp 20   SpO2 98%  Gen:   Awake, no distress   Resp:  Normal effort  MSK:   Moves extremities without difficulty  Other:    Medical Decision Making  Medically screening exam initiated at 7:27 PM.  Appropriate orders placed.  MONICK RENA was informed that the remainder of the evaluation will be completed by another provider, this initial triage assessment does not replace that evaluation, and the importance of remaining in the ED until their evaluation is complete.    Hildegard Loge, PA-C 10/05/23 1928

## 2023-10-05 NOTE — ED Triage Notes (Signed)
 Patient had a left trigger ring finger release surgery this afternoon , reports SOB and feeling anxious after surgery , denies pain .

## 2023-10-05 NOTE — Op Note (Signed)
   Date of Surgery: 10/05/2023  INDICATIONS: Patient is a 58 y.o.-year-old female with left ring finger stenosing tenosynovitis that has failed conservative management.  Risks, benefits, and alternatives to surgery were again discussed with the patient in the preoperative area. The patient wishes to proceed with surgery.  Informed consent was signed after our discussion.   PREOPERATIVE DIAGNOSIS:  Left ring trigger finger  POSTOPERATIVE DIAGNOSIS: Same.  PROCEDURE:  Left ring finger A1 pulley release   SURGEON: Carlin Galla, M.D.  ASSIST: None  ANESTHESIA:  Local + MAC  IV FLUIDS AND URINE: See anesthesia.  ESTIMATED BLOOD LOSS: <5 mL.  IMPLANTS: * No implants in log *   DRAINS: None  COMPLICATIONS: None  DESCRIPTION OF PROCEDURE: The patient was met in the preoperative holding area where the surgical site was marked and the consent form was signed.  The patient was then taken to the operating room and remained on the stretcher.  A hand table was placed adjacent to the left upper extremity.  A tourniquet was applied to the left forearm.  Monitored sedation  was induced.  A formal time-out was performed to confirm that this was the correct patient, surgery, side, and site. A local block was performed using 6cc of 1% plain lidocaine .  The operative extremity was prepped and draped in the usual and sterile fashion.    The limb was exsanguinated and the tourniquet inflated to 250 mmHg.  A longitudinal incision was made over the A1 pulley.  The skin was incised.  Blunt dissection was used to identify the A1 pulley.  Two Ragnell retractors were placed on the radial and ulnar sides of the pulley to protect the respective neurovascular bundles.  A third Ragnell was placed at the distal aspect of the wound.  The A1 pulley was clearly identified.  Under direct visualization, the pulley was entered sharply using a 15 blade.  Tenotomy scissors were used to complete the pulley release distally  to the level of the A2 pulley.  The distal retractor was then placed in the proximal aspect of the wound.  Under direct visualization, the proximal aspect of the A1 pulley was completely released.   Following satisfactory A1 pulley release, the tourniquet was let down and hemostasis achieved with direct pressure and bipolar electrocautery.  The wound was then thoroughly irrigated.  It was closed using 4-0 nylon sutures in a horizontal mattress fashion.  The wound was dressed with xeroform, 4x4, and an ace wrap.   The patient was reversed from sedation.  All counts were correct x 2 at the end of the procedure.  The patient was then taken to the PACU in stable condition.   POSTOPERATIVE PLAN: She will be discharged to home with appropriate pain medication and discharge instructions.  I'll see her back in 10-14 days for her first postop visit.   Carlin Galla, MD 5:48 PM

## 2023-10-06 ENCOUNTER — Encounter (HOSPITAL_COMMUNITY): Payer: Self-pay | Admitting: Orthopedic Surgery

## 2023-10-06 NOTE — Discharge Instructions (Signed)
 Your work-up today was reassuring.  Make sure to drink fluids and rest tonight. Take it easy tomorrow. Follow-up with your doctor. Return here for new concerns.

## 2023-10-06 NOTE — ED Provider Notes (Signed)
 [facility] AT [facility] Provider Note   CSN: [id number] Arrival date & time: 10/05/23  8082     History  Chief Complaint  Patient presents with   SOB / Anxiety    Holly Alvarado is a 58 y.o. female.  The history is provided by the patient and medical records.   58 year old female with history of hypertension, depression, anxiety, GERD, obesity, prediabetes, presenting to the ED with shortness of breath.  She underwent trigger release this afternoon, states procedure went well.  She felt like her postanesthesia recovery was rushed as her surgery was late yesterday afternoon. States she got to her car and began to feel funny, like she wasn't fully awake yet and in a fog.  She started home but was concerned about being far away from the hospital so came to the ED once she was released from surgical center.  States feeling this way caused her to panic which she thinks made her feel SOB.  No chest pain.  By time of my evaluation (6+ hours after arrival) feeling back to baseline.  Home Medications Prior to Admission medications   Medication Sig Start Date End Date Taking? Authorizing Provider  acetaminophen  (TYLENOL ) 500 MG tablet Take 500-1,000 mg by mouth every 6 (six) hours as needed for moderate pain.    [provider]  alum & mag hydroxide-simeth (MAALOX/MYLANTA) 200-200-20 MG/5ML suspension Take 15-30 mLs by mouth every 6 (six) hours as needed for indigestion or heartburn.    [provider]  amLODipine  (NORVASC ) 2.5 MG tablet TAKE 1 TABLET BY MOUTH EVERY DAY 03/24/23   Antonetta Rollene BRAVO, MD  carvedilol  (COREG ) 6.25 MG tablet Take 6.25 mg by mouth 2 (two) times daily with a meal.    [provider]  diphenhydrAMINE (BENADRYL) 25 MG tablet Take 25 mg by mouth daily as needed for itching.    [provider]  esomeprazole  (NEXIUM ) 20 MG capsule Take 20 mg by mouth 2 (two) times daily as needed (heartburn).    [provider]  Fluocinolone  Acetonide Body (DERMA-SMOOTHE /FS BODY) 0.01 % OIL Use one application 3-5 times weekly to control scalp inflammation. 06/01/23   Iva Marty Saltness, MD  fluticasone  (FLONASE ) 50 MCG/ACT nasal spray Place 2 sprays into both nostrils daily. Patient taking differently: Place 2 sprays into both nostrils daily as needed for allergies. 03/16/23   Antonetta Rollene BRAVO, MD  levocetirizine (XYZAL ) 5 MG tablet Take 5 mg by mouth every evening.    [provider]  montelukast  (SINGULAIR ) 10 MG tablet Take 1 tablet (10 mg total) by mouth at bedtime. 06/01/23 08/30/23  Iva Marty Saltness, MD  Olopatadine-Mometasone  (RYALTRIS ) [phone] MCG/ACT SUSP Place 2 sprays into the nose 2 (two) times daily as needed. 06/01/23   Iva Marty Saltness, MD  oxyCODONE  (ROXICODONE ) 5 MG immediate release tablet Take 1 tablet (5 mg total) by mouth every 6 (six) hours as needed for up to 2 days. 10/05/23 10/07/23  Romona Harari, MD  polyethylene glycol (MIRALAX / GLYCOLAX) 17 g packet Take 17 g by mouth daily as needed for moderate constipation.    [provider]  Propylene Glycol (SYSTANE BALANCE) 0.6 % SOLN Place 1 drop into both eyes as needed.    [provider]  venlafaxine  XR (EFFEXOR  XR) 150 MG 24 hr capsule Take 1 capsule (150 mg total) by mouth daily with breakfast. 09/29/22   Antonetta Rollene BRAVO, MD  Vitamin D , Ergocalciferol , (DRISDOL ) 1.25 MG (50000  UNIT) CAPS capsule TAKE 1 CAPSULE (50,000 UNITS TOTAL) BY MOUTH ONCE A WEEK. ONE CAPSULE ONCE WEEKLY 06/20/23   Antonetta Rollene BRAVO, MD      Allergies    Codeine, Sulfa antibiotics, Tape, Aspirin, Miconazole, and Sulfonamide derivatives    Review of Systems   Review of Systems  Respiratory:  Positive for shortness of breath.   All other systems reviewed and are negative.   Physical Exam Updated Vital Signs BP 103/66 (BP Location: Right Arm)   Pulse 71   Temp 98.3 F (36.8 C) (Oral)   Resp 18   SpO2 100%    Physical Exam Vitals and nursing note reviewed.  Constitutional:      Appearance: She is well-developed.  HENT:     Head: Normocephalic and atraumatic.  Eyes:     Conjunctiva/sclera: Conjunctivae normal.     Pupils: Pupils are equal, round, and reactive to light.  Cardiovascular:     Rate and Rhythm: Normal rate and regular rhythm.     Heart sounds: Normal heart sounds.  Pulmonary:     Effort: Pulmonary effort is normal.     Breath sounds: Normal breath sounds. No wheezing or rhonchi.     Comments: Lungs CTAB Abdominal:     General: Bowel sounds are normal.     Palpations: Abdomen is soft.  Musculoskeletal:        General: Normal range of motion.     Cervical back: Normal range of motion.     Comments: Left ring finger/hand in surgical bandage  Skin:    General: Skin is warm and dry.  Neurological:     Mental Status: She is alert and oriented to person, place, and time.     Comments: AAOx3, answering questions and following commands appropriately; equal strength UE and LE bilaterally; CN grossly intact; moves all extremities appropriately without ataxia; no focal neuro deficits or facial asymmetry appreciated  Psychiatric:     Comments: Calm, does not appear anxious currently     ED Results / Procedures / Treatments   Labs (all labs ordered are listed, but only abnormal results are displayed) Labs Reviewed  BASIC METABOLIC PANEL - Abnormal; Notable for the following components:      Result Value   CO2 18 (*)    Glucose, Bld 216 (*)    Calcium 8.8 (*)    All other components within normal limits  CBC WITH DIFFERENTIAL/PLATELET - Abnormal; Notable for the following components:   RBC 6.03 (*)    MCV 72.1 (*)    MCH 23.1 (*)    RDW 17.4 (*)    Neutro Abs 1.6 (*)    Lymphs Abs 7.6 (*)    All other components within normal limits  BRAIN NATRIURETIC PEPTIDE  MAGNESIUM   TROPONIN I (HIGH SENSITIVITY)  TROPONIN I (HIGH SENSITIVITY)    EKG None  Radiology DG  Chest 2 View Result Date: 10/05/2023 CLINICAL DATA:  Shortness of breath EXAM: CHEST - 2 VIEW COMPARISON:  None Available. FINDINGS: The heart size and mediastinal contours are within normal limits. Both lungs are clear. The visualized skeletal structures are unremarkable. IMPRESSION: No active cardiopulmonary disease. Electronically Signed   By: Oneil Devonshire M.D.   On: 10/05/2023 20:37    Procedures Procedures    Medications Ordered in ED Medications - No data to display  ED Course/ Medical Decision Making/ A&P  Medical Decision Making  58 year old female presenting to the ED with foggy headedness and some shortness of breath following trigger finger release.  From talking with patient, it sounds like her surgery was late in the afternoon and her postanesthesia recovery was a bit rushed.  She essentially left the surgical center and did not feel right so she came to the ED.  Since time of arrival here she has since returned to baseline and is currently asymptomatic on my evaluation.  EKG with sinus tach, heart rate has stabilized while here in the ED.  Labs are grossly reassuring without leukocytosis or electrolyte derangement.  Troponin negative x 2.  Chest x-ray is clear.  She remains hemodynamically stable.  Low suspicion for ACS, PE, dissection, other acute cardiac event.  Recommended close follow-up with PCP.  Return here for new concerns.  Final Clinical Impression(s) / ED Diagnoses Final diagnoses:  Shortness of breath    Rx / DC Orders ED Discharge Orders     None         Jarold Olam HERO, PA-C 10/06/23 0222    Theadore Ozell HERO, MD 10/06/23 [phone]

## 2023-10-06 NOTE — ED Notes (Signed)
 ..  The patient is A&OX4, ambulatory at d/c with independent steady gait, NAD. Pt verbalized understanding of d/c instructions and follow up care.

## 2023-10-06 NOTE — Anesthesia Postprocedure Evaluation (Signed)
 Anesthesia Post Note  Patient: Holly Alvarado  Procedure(s) Performed: RELEASE ring TRIGGER FINGER/A-1 PULLEY (Left: Ring Finger)     Patient location during evaluation: PACU Anesthesia Type: MAC Level of consciousness: awake and alert Pain management: pain level controlled Vital Signs Assessment: post-procedure vital signs reviewed and stable Respiratory status: spontaneous breathing, nonlabored ventilation and respiratory function stable Cardiovascular status: stable and blood pressure returned to baseline Anesthetic complications: no   No notable events documented.  Last Vitals:  Vitals:   10/05/23 1800 10/05/23 1805  BP: 116/76 116/76  Pulse: 73 80  Resp: 14 14  Temp:  36.7 C  SpO2: 96% 98%    Last Pain:  Vitals:   10/05/23 1750  TempSrc:   PainSc: 0-No pain                 Debby FORBES Like

## 2023-10-07 ENCOUNTER — Other Ambulatory Visit: Payer: Self-pay | Admitting: Family Medicine

## 2023-11-01 ENCOUNTER — Encounter (INDEPENDENT_AMBULATORY_CARE_PROVIDER_SITE_OTHER): Payer: Self-pay | Admitting: Gastroenterology

## 2023-11-01 ENCOUNTER — Ambulatory Visit (INDEPENDENT_AMBULATORY_CARE_PROVIDER_SITE_OTHER): Payer: 59 | Admitting: Gastroenterology

## 2023-11-01 VITALS — BP 137/77 | HR 80 | Temp 97.9°F | Ht 62.0 in | Wt 299.0 lb

## 2023-11-01 DIAGNOSIS — Z8719 Personal history of other diseases of the digestive system: Secondary | ICD-10-CM

## 2023-11-01 DIAGNOSIS — K7460 Unspecified cirrhosis of liver: Secondary | ICD-10-CM

## 2023-11-01 DIAGNOSIS — K7581 Nonalcoholic steatohepatitis (NASH): Secondary | ICD-10-CM | POA: Diagnosis not present

## 2023-11-01 DIAGNOSIS — K219 Gastro-esophageal reflux disease without esophagitis: Secondary | ICD-10-CM

## 2023-11-01 NOTE — Progress Notes (Addendum)
 Referring Provider: Antonetta Rollene BRAVO, MD Primary Care Physician:  Antonetta Rollene BRAVO, MD Primary GI Physician: Dr. Eartha   Chief Complaint  Patient presents with   Follow-up    Pt arrives for follow up. Pt having worsening reflux. Pt states even water  gives her reflux. Pt sleeps with head elevated and does not eat after 6   HPI:   Holly Alvarado is a 58 y.o. 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 Pylori, obesity, Htn, HLD, anxiety, GERD   Patient presenting today for follow up of NASH cirrhosis and GERD  Last seen July 2024, at that time patient was doing well, was on Saxenda  and losing weight but insurance changes upcoming med so she had to stop, right upper quadrant pain had improved from previous, having some occasional nausea but felt this was related to sinus drainage.  Reported her PCP changed her carvedilol  back to 6.25 mg twice daily as her BP was running low and she was feeling somewhat dizzy and tired.  Also found to have right bundle branch block on recent EKG and was to see cardiology soon.  Was having more reflux symptoms in the evenings was having more reflux symptoms in the evenings, taking Nexium  20 mg in the morning and 20 mg nightly  Recommend right upper quadrant ultrasound, schedule colonoscopy, INR, AFP, continue carvedilol  6.25 mg twice daily, keep blood pressure heart rate log x 2 weeks, continue Nexium  20 mg in the morning, increase to 40 mg in the evening, repeat H. pylori testing for confirmed eradication, hold PPI x 2 weeks.  H. pylori breath test 04/12/2023 was negative  Right upper quadrant ultrasound set/12/24 with morphologic changes compatible with cirrhosis, no focal lesions, prominence of the CBD measuring up to 11.2 mm, recommend correlation with LFTs  Patient underwent MRCP on 05/03/2023 which again showed cirrhosis, no evidence of hepatic neoplasm, prior cholecystectomy, CBD measures 10 mm which is upper  limits of normal, no evidence of choledocholithiasis or other obstructing etiology, prior gastric bypass surgery with small hiatal hernia.  Last MELD 3.0 November 2024 was 9  Present: Doing well but still having reflux symptoms at night, notes she is doing nexium  20mg  BID. She does not think she increased nexium  to 40mg  in the evenings but wonders if she should as she is only having symtpoms at night. Notes she tries to avoid eating late and has HOB elevated but this does not seem to help. She had been on protonix  previously, then switched to nexium  maybe less than a year ago. No dysphagia or odynophagia  Denies sweling to her abdomen or legs.  No episodes of confusion. She is taking coreg  6.25mg  BID. HR is 80 today, BP 137/77 today. She takes her BP at home, usually runs in the teens around 117/60. HR can be in the 80s/90s at home.   She is trying to get on the compounded saxenda , she was on saxenda  previously and did well but insurance stopped covering. She did [employer] weight loss in the past but this was difficult with her schedule. Has plans to discuss weight loss options with her PCP   Cirrhosis related questions: Hematemesis/coffee ground emesis: No History of variceal bleeding: No Abdominal pain: No Abdominal distention/worsening ascites: no  Fever/chills: No Episodes of confusion/disorientation: No Taking diuretics?: No Prior history of banding?: No EVs: Grade II On beta blockers: carvedilol  3.25mg  BID  Prior episodes of SBP: No Last time liver imaging  was performed: 04/2023 [facility] as outlined above   Last EGD: 05/21/2022 Grade II varices were found in the lower third of the esophagus. A 1 cm hiatal hernia was present. Evidence of a gastric bypass was found. A gastric pouch with a 7 cm length from the GE junction to the gastrojejunal anastomosis was found. The staple line appeared intact. There was a small polyp at the GJ junction which had mild inflammatory appearance. The  gastrojejunal anastomosis was characterized by healthy appearing mucosa. The duodenum-to-jejunum limb was examined. Biopsies were taken with a cold forceps for Helicobacter pylori testing. The examined jejunum was normal - both limbs.   A.   STOMACH, POLYPECTOMY:  -    Mildly active chronic gastritis, with associated Helicobacter  pylori infection (H. pylori immunohistochemical (IHC) stain positive for  organisms, with adequate controls).  -    Focal intestinal metaplasia.  -    Chromogranin IHC stain without ECL cell hyperplasia, with adequate  controls.  -   No vasculopathy identified.   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    Advised to repeat EGD in in 3 years.   Last Colonoscopy:08/2023 hemorrhoids found on perianal exam, one 3 mm polyp in the rectum, nonbleeding internal hemorrhoids  Repeat colonoscopy 5 years   Past Medical History:  Diagnosis Date   Allergic rhinitis, seasonal    Anemia    hx of    Anxiety    hx of panic attacks and panic attacks with surgery    Anxiety disorder    Asthma    Beta thalassemia trait    Chronic constipation    Complication of anesthesia    woke up during surgery of tubal ligation    Esophageal polyp    Esophageal varices (HCC)    Fatty liver    GERD (gastroesophageal reflux disease)    Hemorrhoids    Hypertension    Obesity    Pre-diabetes     Past Surgical History:  Procedure Laterality Date   BIOPSY  05/21/2022   Procedure: BIOPSY;  Surgeon: Eartha Angelia Sieving, MD;  Location: [facility];  Service: Gastroenterology;;   BLT  09/27/1985   BREAST BIOPSY Right 09/27/2008   benign   BREAST BIOPSY Left 10/07/2022   US  LT BREAST BX W LOC DEV 1ST LESION IMG BX SPEC US  GUIDE 10/07/2022 [facility]   BREAST BIOPSY  03/07/2023   MM LT RADIOACTIVE SEED LOC MAMMO GUIDE 03/07/2023 [facility]   BREATH TEK H PYLORI  N/A 02/25/2014   Procedure: BREATH TEK H PYLORI;  Surgeon: Donnice KATHEE Lunger, MD;  Location: [facility];  Service: General;  Laterality: N/A;   CHOLECYSTECTOMY  09/28/1995   COLONOSCOPY  12/30/2010   COLONOSCOPY N/A 11/23/2017   Procedure: COLONOSCOPY;  Surgeon: Golda Claudis PENNER, MD;  Location: [facility];  Service: Endoscopy;  Laterality: N/A;  1130   COLONOSCOPY WITH PROPOFOL  N/A 08/30/2023   Procedure: COLONOSCOPY WITH PROPOFOL ;  Surgeon: Eartha Angelia Sieving, MD;  Location: [facility];  Service: Gastroenterology;  Laterality: N/A;  9:15AM;ASA 3   ECTOPIC PREGNANCY SURGERY  1994, 1995   ESOPHAGOGASTRODUODENOSCOPY (EGD) WITH PROPOFOL  N/A 05/21/2022   Procedure: ESOPHAGOGASTRODUODENOSCOPY (EGD) WITH PROPOFOL ;  Surgeon: Eartha Angelia Sieving, MD;  Location: [facility];  Service: Gastroenterology;  Laterality: N/A;  900 ASA 2   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   Procedure: LAPAROSCOPIC ROUX-EN-Y GASTRIC BYPASS WITH UPPER ENDOSCOPY;  Surgeon: Donnice  KATHEE Lunger, MD;  Location: [facility];  Service: General;  Laterality: N/A;   MOUTH SURGERY     wisdom tooth extraction   PARTIAL HYSTERECTOMY     POLYPECTOMY  08/30/2023   Procedure: POLYPECTOMY INTESTINAL;  Surgeon: Eartha Angelia Sieving, MD;  Location: [facility];  Service: Gastroenterology;;   RADIOACTIVE SEED GUIDED EXCISIONAL BREAST BIOPSY Left 03/08/2023   Procedure: RADIOACTIVE SEED GUIDED EXCISIONAL LEFT BREAST BIOPSY;  Surgeon: Ebbie Cough, MD;  Location: [facility];  Service: General;  Laterality: Left;   TRIGGER FINGER RELEASE Left 10/05/2023   Procedure: RELEASE ring TRIGGER FINGER/A-1 PULLEY;  Surgeon: Romona Harari, MD;  Location: [facility];  Service: Orthopedics;  Laterality: Left;  local   TUBAL LIGATION     UPPER GASTROINTESTINAL ENDOSCOPY  12/30/2010   UPPER GASTROINTESTINAL ENDOSCOPY  05/11/2004   UPPER GASTROINTESTINAL ENDOSCOPY  04/30/1997   Holly Alvarado    Current Outpatient Medications  Medication Sig  Dispense Refill   acetaminophen  (TYLENOL ) 500 MG tablet Take 500-1,000 mg by mouth every 6 (six) hours as needed for moderate pain.     alum & mag hydroxide-simeth (MAALOX/MYLANTA) 200-200-20 MG/5ML suspension Take 15-30 mLs by mouth every 6 (six) hours as needed for indigestion or heartburn.     amLODipine  (NORVASC ) 2.5 MG tablet TAKE 1 TABLET BY MOUTH EVERY DAY 90 tablet 3   carvedilol  (COREG ) 6.25 MG tablet TAKE 1 TABLET BY MOUTH 2 TIMES DAILY WITH A MEAL. 180 tablet 3   diphenhydrAMINE (BENADRYL) 25 MG tablet Take 25 mg by mouth daily as needed for itching.     esomeprazole  (NEXIUM ) 20 MG capsule Take 20 mg by mouth 2 (two) times daily as needed (heartburn).     Fluocinolone  Acetonide Body (DERMA-SMOOTHE /FS BODY) 0.01 % OIL Use one application 3-5 times weekly to control scalp inflammation. 118 mL 5   fluticasone  (FLONASE ) 50 MCG/ACT nasal spray Place 2 sprays into both nostrils daily. (Patient taking differently: Place 2 sprays into both nostrils daily as needed for allergies.) 16 g 6   levocetirizine (XYZAL ) 5 MG tablet Take 5 mg by mouth every evening.     Olopatadine-Mometasone  (RYALTRIS ) 665-25 MCG/ACT SUSP Place 2 sprays into the nose 2 (two) times daily as needed. 29 g 5   polyethylene glycol (MIRALAX / GLYCOLAX) 17 g packet Take 17 g by mouth daily as needed for moderate constipation.     Propylene Glycol (SYSTANE BALANCE) 0.6 % SOLN Place 1 drop into both eyes as needed.     venlafaxine  XR (EFFEXOR  XR) 150 MG 24 hr capsule Take 1 capsule (150 mg total) by mouth daily with breakfast. 90 capsule 5   Vitamin D , Ergocalciferol , (DRISDOL ) 1.25 MG (50000 UNIT) CAPS capsule TAKE 1 CAPSULE (50,000 UNITS TOTAL) BY MOUTH ONCE A WEEK. ONE CAPSULE ONCE WEEKLY 12 capsule 2   montelukast  (SINGULAIR ) 10 MG tablet Take 1 tablet (10 mg total) by mouth at bedtime. 90 tablet 1   No current facility-administered medications for this visit.    Allergies as of 11/01/2023 - Review Complete 11/01/2023   Allergen Reaction Noted   Codeine  07/04/2014   Sulfa antibiotics Hives 08/01/2019   Tape Rash 05/03/2017   Aspirin  03/02/2023   Miconazole Swelling 08/02/2022   Sulfonamide derivatives Hives and Itching 02/20/2008    Family History  Problem Relation Age of Onset   Asthma Mother    Angioedema Mother    Hypertension Mother    COPD Mother    Depression Mother    Hyperlipidemia Father  Hypertension Father    Depression Father    Alcohol abuse Father    Drug abuse Father    Hyperlipidemia Sister    Hypertension Sister    Hyperlipidemia Sister    Hypertension Sister    Colon cancer Maternal Aunt    Colon cancer Maternal Aunt    Colon cancer Maternal Aunt    Colon cancer Maternal Aunt    Colon cancer Maternal Aunt    Colon cancer Maternal Aunt    Schizophrenia Maternal Uncle    Diabetes Other        family history    Asthma Other        family history    Arthritis Other        family history    Stroke Other    Cancer Other    Heart disease Other     Social History   Socioeconomic History   Marital status: Married    Spouse name: Not on file   Number of children: 2   Years of education: college   Highest education level: Not on file  Occupational History   Occupation: Air traffic controller   Tobacco Use   Smoking status: Never    Passive exposure: Never   Smokeless tobacco: Never  Vaping Use   Vaping status: Never Used  Substance and Sexual Activity   Alcohol use: No    Alcohol/week: 0.0 standard drinks of alcohol   Drug use: No   Sexual activity: Yes    Birth control/protection: Surgical  Other Topics Concern   Not on file  Social History Narrative   Not on file   Social Drivers of Health   Financial Resource Strain: Low Risk  (01/06/2022)   Overall Financial Resource Strain (CARDIA)    Difficulty of Paying Living Expenses: Not very hard  Food Insecurity: No Food Insecurity (01/06/2022)   Hunger Vital Sign    Worried About Running Out  of Food in the Last Year: Never true    Ran Out of Food in the Last Year: Never true  Transportation Needs: No Transportation Needs (01/06/2022)   PRAPARE - Administrator, Civil Service (Medical): No    Lack of Transportation (Non-Medical): No  Physical Activity: Not on file  Stress: Stress Concern Present (01/06/2022)   [employer] of Occupational Health - Occupational Stress Questionnaire    Feeling of Stress : Rather much  Social Connections: Not on file    Review of systems General: negative for malaise, night sweats, fever, chills, weight loss Neck: Negative for lumps, goiter, pain and significant neck swelling Resp: Negative for cough, wheezing, dyspnea at rest CV: Negative for chest pain, leg swelling, palpitations, orthopnea GI: denies melena, hematochezia, nausea, vomiting, diarrhea, constipation, dysphagia, odyonophagia, early satiety or unintentional weight loss. +GERD symptoms  MSK: Negative for joint pain or swelling, back pain, and muscle pain. Derm: Negative for itching or rash Psych: Denies depression, anxiety, memory loss, confusion. No homicidal or suicidal ideation.  Heme: Negative for prolonged bleeding, bruising easily, and swollen nodes. Endocrine: Negative for cold or heat intolerance, polyuria, polydipsia and goiter. Neuro: negative for tremor, gait imbalance, syncope and seizures. The remainder of the review of systems is noncontributory.  Physical Exam: BP 137/77   Pulse 80   Temp 97.9 F (36.6 C)   Ht 5' 2 (1.575 m)   Wt 299 lb (135.6 kg)   BMI 54.69 kg/m  General:   Alert and oriented. No distress noted. Pleasant and cooperative.  Head:  Normocephalic and atraumatic. Eyes:  Conjuctiva clear without scleral icterus. Mouth:  Oral mucosa pink and moist. Good dentition. No lesions. Heart: Normal rate and rhythm, s1 and s2 heart sounds present.  Lungs: Clear lung sounds in all lobes. Respirations equal and unlabored. Abdomen:  +BS,  soft, non-tender and non-distended. No rebound or guarding. No HSM or masses noted. Derm: No palmar erythema or jaundice Msk:  Symmetrical without gross deformities. Normal posture. Extremities:  Without edema. Neurologic:  Alert and  oriented x4 Psych:  Alert and cooperative. Normal mood and affect.  Invalid input(s): 6 MONTHS   ASSESSMENT: Holly Alvarado is a 58 y.o. female presenting today for follow up of NASH cirrhosis and GERD  GERD: Having heartburn mostly at night despite not eating late and elevating head of bed.  She is still taking Nexium  20 mg twice daily, there was some confusion and she did not increase her evening dose of Nexium  to 40 mg after last OV.  She is currently taking her evening dose a few hours after dinner, I advised her to take 20 mg of Nexium  in the morning 30 minutes prior to breakfast and take her second Nexium  dose of 40 mg in the evenings about 30 minutes prior to dinner.  If she does not have improvement in her GERD symptoms with change in PPI dosage/timing she should let me know.  NASH Cirrhosis: has remained well compensated. No ascites or concerns for HE. EGD in 2023 with Grade II EVs, she was initially started on propranolol 6.25 mg twice daily with dosage increased to 12.5 mg twice daily at the end of November, however her PCP changed her back to 6.2 mg twice daily a few months thereafter as BP was running somewhat soft and she was feeling fatigued and dizzy.  Heart rate is not at goal today, she does check her blood pressure and heart rate occasionally at home and reports blood pressure usually runs around 117/60, heart rate can be in the 80s or 90s but she has not been checking this consistently.  Given heart rate is not at goal today and that she has had issues with softer blood pressures in the past, I recommend she check her blood pressure and heart rate once a day at the same time a day x 2 weeks and let me know what the results are of this.  If heart rate  is consistently above goal 60 bpm and blood pressure normal, will need to increase carvedilol  back to 12.5 mg twice daily.  She is due for [facility] screening via ultrasound which we will get scheduled, she is up-to-date on MELD labs until May 2025. Last MELD 3.0 in November was 9.   Patient did inquire on my thoughts about weight loss medications/weight loss clinic as she has tried to lose weight on her own without success.  She did well previously on Saxenda  but her insurance stopped covering this.  I had a thorough discussion with the patient that weight loss medications can be beneficial when utilized as a tool along with dietary changes and exercise, however, in patients with advanced liver disease there is concern for muscle wasting with these medications therefore diet needs to be focused on higher protein intake.  We also discussed that she may benefit from meeting with a dietitian if her PCP prescribes a weight loss medicine for her.   PLAN:  -Right upper quadrant ultrasound for HCC screening -MELD labs, CBC, INR due May 2025 (in reminder folder  today around this time) -Continue Nexium  twice daily-take 20mg  morning and increase to 40mg  in the evening -Continue carvedilol  6.25 mg twice daily (keep track of HR and BP x 2 weeks, once daily), will go back up to 12.5mg  BID if HR consistently above goal and BP good - Reduce salt intake to <2 g per day - Can take Tylenol  max of 2 g per day (650 mg q8h) for pain - Avoid NSAIDs for pain - Avoid eating raw oysters/shellfish - Ensure every night before going to sleep -Discuss weight loss options with Dr. Antonetta  -need to focus on high protein diet, especially if starting weight loss meds All questions were answered, patient verbalized understanding and is in agreement with plan as outlined above.    Follow Up: 6 months   Griffith Santilli L. Mariette, MSN, APRN, AGNP-C Adult-Gerontology Nurse Practitioner [facility]  I have reviewed  the note and agree with the APP's assessment as described in this progress note  Toribio Fortune, MD Gastroenterology and Hepatology [facility] Gastroenterology

## 2023-11-01 NOTE — Patient Instructions (Signed)
-  We will get you scheduled for right upper quadrant ultrasound regards to cirrhosis -You will be due for liver labs in May, we will put in reminder folder and contact you for these closer to time -Continue Nexium  twice daily-take 20mg  morning and 40mg  about 30 minutes prior to dinner -Continue carvedilol  6.25 mg twice daily-keep track of HR and BP once daily x 2 weeks and let me know what these readings are as we may need to increase your carvedilol  back up given your heart rate and blood pressure today - Reduce salt intake to <2 g per day - Can take Tylenol  max of 2 g per day (650 mg q8h) for pain - Avoid NSAIDs for pain - Avoid eating raw oysters/shellfish - Ensure every night before going to sleep -Discuss weight loss meds with Dr. Antonetta, as we discussed it is important to focus on high-protein especially if you are using a weight loss drug in order to avoid muscle loss  Follow-up 6 months  It was a pleasure to see you today. I want to create trusting relationships with patients and provide genuine, compassionate, and quality care. I truly value your feedback! please be on the lookout for a survey regarding your visit with me today. I appreciate your input about our visit and your time in completing this!    Holly Alvarado L. Anadalay Macdonell, MSN, APRN, AGNP-C Adult-Gerontology Nurse Practitioner [facility] at [facility]

## 2023-11-02 ENCOUNTER — Telehealth (INDEPENDENT_AMBULATORY_CARE_PROVIDER_SITE_OTHER): Payer: Self-pay | Admitting: Gastroenterology

## 2023-11-02 NOTE — Telephone Encounter (Signed)
 Ultrasound scheduled for 11/07/23 at 8:30am. Pt to arrive at 8:15am. NPO after midnight. Pt contacted and verbalized understanding.

## 2023-11-07 ENCOUNTER — Ambulatory Visit (HOSPITAL_COMMUNITY): Payer: 59 | Attending: Gastroenterology

## 2023-11-29 ENCOUNTER — Ambulatory Visit: Payer: BC Managed Care – PPO | Admitting: Family Medicine

## 2023-12-09 ENCOUNTER — Ambulatory Visit (HOSPITAL_COMMUNITY)
Admission: RE | Admit: 2023-12-09 | Discharge: 2023-12-09 | Disposition: A | Discharge status: Home / Self Care | Source: Ambulatory Visit | Attending: Gastroenterology | Admitting: Gastroenterology

## 2023-12-09 DIAGNOSIS — K7460 Unspecified cirrhosis of liver: Secondary | ICD-10-CM | POA: Diagnosis present

## 2023-12-13 ENCOUNTER — Other Ambulatory Visit: Payer: Self-pay | Admitting: Family Medicine

## 2023-12-16 ENCOUNTER — Encounter (INDEPENDENT_AMBULATORY_CARE_PROVIDER_SITE_OTHER): Payer: Self-pay

## 2023-12-19 ENCOUNTER — Encounter (INDEPENDENT_AMBULATORY_CARE_PROVIDER_SITE_OTHER): Payer: Self-pay

## 2024-01-04 ENCOUNTER — Encounter: Payer: Self-pay | Admitting: Family Medicine

## 2024-01-04 ENCOUNTER — Ambulatory Visit: Payer: Self-pay | Admitting: Neurology

## 2024-01-04 ENCOUNTER — Ambulatory Visit: Admitting: Family Medicine

## 2024-01-04 ENCOUNTER — Encounter: Payer: Self-pay | Admitting: Neurology

## 2024-01-04 VITALS — BP 132/77 | HR 67 | Ht 62.0 in | Wt 289.2 lb

## 2024-01-04 VITALS — BP 115/77 | HR 84 | Resp 16 | Ht 62.0 in | Wt 288.1 lb

## 2024-01-04 DIAGNOSIS — I671 Cerebral aneurysm, nonruptured: Secondary | ICD-10-CM | POA: Diagnosis not present

## 2024-01-04 DIAGNOSIS — Z23 Encounter for immunization: Secondary | ICD-10-CM

## 2024-01-04 DIAGNOSIS — E785 Hyperlipidemia, unspecified: Secondary | ICD-10-CM | POA: Diagnosis not present

## 2024-01-04 DIAGNOSIS — H02401 Unspecified ptosis of right eyelid: Secondary | ICD-10-CM

## 2024-01-04 DIAGNOSIS — E8881 Metabolic syndrome: Secondary | ICD-10-CM

## 2024-01-04 DIAGNOSIS — H5704 Mydriasis: Secondary | ICD-10-CM

## 2024-01-04 DIAGNOSIS — G510 Bell's palsy: Secondary | ICD-10-CM | POA: Diagnosis not present

## 2024-01-04 DIAGNOSIS — K219 Gastro-esophageal reflux disease without esophagitis: Secondary | ICD-10-CM

## 2024-01-04 DIAGNOSIS — R200 Anesthesia of skin: Secondary | ICD-10-CM

## 2024-01-04 DIAGNOSIS — R7303 Prediabetes: Secondary | ICD-10-CM

## 2024-01-04 DIAGNOSIS — I10 Essential (primary) hypertension: Secondary | ICD-10-CM

## 2024-01-04 DIAGNOSIS — R202 Paresthesia of skin: Secondary | ICD-10-CM

## 2024-01-04 DIAGNOSIS — F418 Other specified anxiety disorders: Secondary | ICD-10-CM

## 2024-01-04 DIAGNOSIS — F411 Generalized anxiety disorder: Secondary | ICD-10-CM

## 2024-01-04 DIAGNOSIS — R29810 Facial weakness: Secondary | ICD-10-CM

## 2024-01-04 MED ORDER — TOPIRAMATE 50 MG PO TABS: 50.0000 mg | ORAL_TABLET | Freq: Every day | ORAL | 6 refills | Status: DC

## 2024-01-04 MED ORDER — ALPRAZOLAM 0.25 MG PO TABS: ORAL_TABLET | ORAL | 0 refills | Status: DC

## 2024-01-04 NOTE — Patient Instructions (Addendum)
 CTA head and neck  Treat migraines: start topiramate 50mg  at bedtime Highly consider Emgality injections, 2 injections the first month and then one injection every month thereafter. Prescribe Emgality  Galcanezumab Injection What is this medication? GALCANEZUMAB (gal ka NEZ ue mab) prevents migraines. It works by blocking a substance in the body that causes migraines. It may also be used to treat cluster headaches. It is a monoclonal antibody. This medicine may be used for other purposes; ask your health care provider or pharmacist if you have questions. COMMON BRAND NAME(S): Emgality What should I tell my care team before I take this medication? They need to know if you have any of these conditions: An unusual or allergic reaction to galcanezumab, other medications, foods, dyes, or preservatives Pregnant or trying to get pregnant Breast-feeding How should I use this medication? This medication is injected under the skin. You will be taught how to prepare and give it. Take it as directed on the prescription label. Keep taking it unless your care team tells you to stop. It is important that you put your used needles and syringes in a special sharps container. Do not put them in a trash can. If you do not have a sharps container, call your pharmacist or care team to get one. Talk to your care team about the use of this medication in children. Special care may be needed. Overdosage: If you think you have taken too much of this medicine contact a poison control center or emergency room at once. NOTE: This medicine is only for you. Do not share this medicine with others. What if I miss a dose? If you miss a dose, take it as soon as you can. If it is almost time for your next dose, take only that dose. Do not take double or extra doses. What may interact with this medication? Interactions are not expected. This list may not describe all possible interactions. Give your health care provider a list  of all the medicines, herbs, non-prescription drugs, or dietary supplements you use. Also tell them if you smoke, drink alcohol, or use illegal drugs. Some items may interact with your medicine. What should I watch for while using this medication? Visit your care team for regular checks on your progress. Tell your care team if your symptoms do not start to get better or if they get worse. What side effects may I notice from receiving this medication? Side effects that you should report to your care team as soon as possible: Allergic reactions or angioedema--skin rash, itching or hives, swelling of the face, eyes, lips, tongue, arms, or legs, trouble swallowing or breathing Side effects that usually do not require medical attention (report to your care team if they continue or are bothersome): Pain, redness, or irritation at injection site This list may not describe all possible side effects. Call your doctor for medical advice about side effects. You may report side effects to FDA at 1-800-FDA-1088. Where should I keep my medication? Keep out of the reach of children and pets. Store in a refrigerator or at room temperature between 20 and 25 degrees C (68 and 77 degrees F). Refrigeration (preferred): Store in the refrigerator. Do not freeze. Keep in the original container until you are ready to take it. Remove the dose from the carton about 30 minutes before it is time for you to use it. If the dose is not used, it may be stored in original container at room temperature for 7 days. Get rid  of any unused medication after the expiration date. Room Temperature: This medication may be stored at room temperature for up to 7 days. Keep it in the original container. Protect from light until time of use. If it is stored at room temperature, get rid of any unused medication after 7 days or after it expires, whichever is first. To get rid of medications that are no longer needed or have expired: Take the  medication to a medication take-back program. Check with your pharmacy or law enforcement to find a location. If you cannot return the medication, ask your pharmacist or care team how to get rid of this medication safely. NOTE: This sheet is a summary. It may not cover all possible information. If you have questions about this medicine, talk to your doctor, pharmacist, or health care provider.  2024 [employer]vier/Gold Standard (2021-11-09 00:00:00) Topiramate Tablets What is this medication? TOPIRAMATE (toe PYRE a mate) prevents and controls seizures in people with epilepsy. It may also be used to prevent migraine headaches. It works by calming overactive nerves in your body. This medicine may be used for other purposes; ask your health care provider or pharmacist if you have questions. COMMON BRAND NAME(S): Topamax, Topiragen What should I tell my care team before I take this medication? They need to know if you have any of these conditions: Bleeding disorder Kidney disease Lung disease Suicidal thoughts, plans, or attempt by you or a family member An unusual or allergic reaction to topiramate, other medications, foods, dyes, or preservatives Pregnant or trying to get pregnant Breast-feeding How should I use this medication? Take this medication by mouth with water. Take it as directed on the prescription label at the same time every day. Do not cut, crush or chew this medicine. Swallow the tablets whole. You can take it with or without food. If it upsets your stomach, take it with food. Keep taking it unless your care team tells you to stop. A special MedGuide will be given to you by the pharmacist with each prescription and refill. Be sure to read this information carefully each time. Talk to your care team about the use of this medication in children. While it may be prescribed for children as young as 2 years for selected conditions, precautions do apply. Overdosage: If you think you have  taken too much of this medicine contact a poison control center or emergency room at once. NOTE: This medicine is only for you. Do not share this medicine with others. What if I miss a dose? If you miss a dose, take it as soon as you can unless it is within 6 hours of the next dose. If it is within 6 hours of the next dose, skip the missed dose. Take the next dose at the normal time. Do not take double or extra doses. What may interact with this medication? Acetazolamide Alcohol Antihistamines for allergy, cough, and cold Aspirin and aspirin-like medications Atropine Certain medications for anxiety or sleep Certain medications for bladder problems, such as oxybutynin, tolterodine Certain medications for depression, such as amitriptyline, fluoxetine, sertraline Certain medications for Parkinson disease, such as benztropine, trihexyphenidyl Certain medications for seizures, such as carbamazepine, lamotrigine, phenobarbital, phenytoin, primidone, valproic acid, zonisamide Certain medications for stomach problems, such as dicyclomine, hyoscyamine Certain medications for travel sickness, such as scopolamine Certain medications that treat or prevent blood clots, such as warfarin, enoxaparin, dalteparin, apixaban, dabigatran, rivaroxaban Digoxin Diltiazem Estrogen and progestin hormones General anesthetics, such as halothane, isoflurane, methoxyflurane, propofol Glyburide Hydrochlorothiazide  Ipratropium Lithium Medications that relax muscles Metformin NSAIDs, medications for pain and inflammation, such as ibuprofen or naproxen Opioid medications for pain Phenothiazines, such as chlorpromazine, mesoridazine, prochlorperazine, thioridazine Pioglitazone This list may not describe all possible interactions. Give your health care provider a list of all the medicines, herbs, non-prescription drugs, or dietary supplements you use. Also tell them if you smoke, drink alcohol, or use illegal drugs.  Some items may interact with your medicine. What should I watch for while using this medication? Visit your care team for regular checks on your progress. Tell your care team if your symptoms do not start to get better or if they get worse. Do not suddenly stop taking this medication. You may develop a severe reaction. Your care team will tell you how much medication to take. If your care team wants you to stop the medication, the dose may be slowly lowered over time to avoid any side effects. Wear a medical ID bracelet or chain. Carry a card that describes your condition. List the medications and doses you take on the card. This medication may affect your coordination, reaction time, or judgment. Do not drive or operate machinery until you know how this medication affects you. Sit up or stand slowly to reduce the risk of dizzy or fainting spells. Drinking alcohol with this medication can increase the risk of these side effects. This medication may cause serious skin reactions. They can happen weeks to months after starting the medication. Contact your care team right away if you notice fevers or flu-like symptoms with a rash. The rash may be red or purple and then turn into blisters or peeling of the skin. You may also notice a red rash with swelling of the face, lips, or lymph nodes in your neck or under your arms. This medication may cause thoughts of suicide or depression. This includes sudden changes in mood, behaviors, or thoughts. These changes can happen at any time but are more common in the beginning of treatment or after a change in dose. Call your care team right away if you experience these thoughts or worsening depression. This medication may slow your child's growth if it is taken for a long time at high doses. Your child's care team will monitor your child's growth. Using this medication for a long time may weaken your bones. The risk of bone fractures may be increased. Talk to your care  team about your bone health. Discuss this medication with your care team if you may be pregnant. Serious birth defects can occur if you take this medication during pregnancy. There are benefits and risks to taking medications during pregnancy. Your care team can help you find the option that works for you. Contraception is recommended while taking this medication. Estrogen and progestin hormones may not work as well while you are taking this medication. Your care team can help you find the option that works for you. Talk to your care team before breastfeeding. Changes to your treatment plan may be needed. What side effects may I notice from receiving this medication? Side effects that you should report to your care team as soon as possible: Allergic reactions--skin rash, itching, hives, swelling of the face, lips, tongue, or throat High acid level--trouble breathing, unusual weakness or fatigue, confusion, headache, fast or irregular heartbeat, nausea, vomiting High ammonia level--unusual weakness or fatigue, confusion, loss of appetite, nausea, vomiting, seizures Fever that does not go away, decrease in sweat Kidney stones--blood in the urine, pain or trouble passing  urine, pain in the lower back or sides Redness, blistering, peeling or loosening of the skin, including inside the mouth Sudden eye pain or change in vision such as blurry vision, seeing halos around lights, vision loss Thoughts of suicide or self-harm, worsening mood, feelings of depression Side effects that usually do not require medical attention (report to your care team if they continue or are bothersome): Burning or tingling sensation in hands or feet Difficulty with paying attention, memory, or speech Dizziness Drowsiness Fatigue Loss of appetite with weight loss Slow or sluggish movements of the body This list may not describe all possible side effects. Call your doctor for medical advice about side effects. You may report  side effects to FDA at 1-800-FDA-1088. Where should I keep my medication? Keep out of the reach of children and pets. Store between 15 and 30 degrees C (59 and 86 degrees F). Protect from moisture. Keep the container tightly closed. Get rid of any unused medication after the expiration date. To get rid of medications that are no longer needed or have expired: Take the medication to a medication take-back program. Check with your pharmacy or law enforcement to find a location. If you cannot return the medication, check the label or package insert to see if the medication should be thrown out in the garbage or flushed down the toilet. If you are not sure, ask your care team. If it is safe to put it in the trash, empty the medication out of the container. Mix the medication with cat litter, dirt, coffee grounds, or other unwanted substance. Seal the mixture in a bag or container. Put it in the trash. NOTE: This sheet is a summary. It may not cover all possible information. If you have questions about this medicine, talk to your doctor, pharmacist, or health care provider.  2024 [employer]/Gold Standard (2022-02-04 00:00:00)
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
## 2024-01-04 NOTE — Assessment & Plan Note (Signed)
 Patient educated about the importance of limiting  Carbohydrate intake , the need to commit to daily physical activity for a minimum of 30 minutes , and to commit weight loss. The fact that changes in all these areas will reduce or eliminate all together the development of diabetes is stressed.      Latest Ref Rng & Units 10/05/2023    7:43 PM 10/05/2023    2:47 PM 09/02/2023    8:10 AM 08/22/2023    1:26 PM 03/17/2023    8:55 AM  Diabetic Labs  HbA1c 4.8 - 5.6 %   6.4   6.4   Chol 100 - 199 mg/dL   914   782   HDL >95 mg/dL   35   35   Calc LDL 0 - 99 mg/dL   80   621   Triglycerides 0 - 149 mg/dL   84   308   Creatinine 0.44 - 1.00 mg/dL 6.57  8.46   9.62        01/04/2024    4:05 PM 01/04/2024   11:03 AM 11/01/2023    3:51 PM 10/06/2023    2:39 AM 10/05/2023   11:21 PM 10/05/2023    7:27 PM 10/05/2023    6:05 PM  BP/Weight  Systolic BP 115 132 137 119 103 132 116  Diastolic BP 77 77 77 74 66 84 76  Wt. (Lbs) 288.12 289.2 299      BMI 52.7 kg/m2 52.9 kg/m2 54.69 kg/m2           No data to display          Updated lab needed at/ before next visit.

## 2024-01-04 NOTE — Patient Instructions (Addendum)
 F/U in 3.5 months, call if you need me sooner   Shingrix #2 today  Labs today, HBa1C, chem 7 and EGFr. If your HBA1c is  6.5  or more you will be diagnosed as beiong diabetic, I will send a message with the result  Limited xanax for as needed use for panic attacks, maximum  6 per month is prescribed  Congrats on weight loss, keep it up  Do additional research on wegovy the supplier should have an mD/Prescriber affiliated to sign for the product  It is important that you exercise regularly at least 30 minutes 5 times a week. If you develop chest pain, have severe difficulty breathing, or feel very tired, stop exercising immediately and seek medical attention   Thanks for choosing [facility], we consider it a privelige to serve you.

## 2024-01-04 NOTE — Assessment & Plan Note (Signed)
 Uncontrolled with reports of intermittent panic attacks,Reports near panic attacks once per week, requests limited xanax

## 2024-01-05 ENCOUNTER — Encounter: Payer: Self-pay | Admitting: Family Medicine

## 2024-01-05 LAB — HEMOGLOBIN A1C
Est. average glucose Bld gHb Est-mCnc: 123 mg/dL
Hgb A1c MFr Bld: 5.9 % — ABNORMAL HIGH (ref 4.8–5.6)

## 2024-01-05 LAB — BMP8+EGFR
BUN/Creatinine Ratio: 14 (ref 9–23)
BUN: 11 mg/dL (ref 6–24)
CO2: 22 mmol/L (ref 20–29)
Calcium: 9.6 mg/dL (ref 8.7–10.2)
Chloride: 108 mmol/L — ABNORMAL HIGH (ref 96–106)
Creatinine, Ser: 0.79 mg/dL (ref 0.57–1.00)
Glucose: 52 mg/dL — ABNORMAL LOW (ref 70–99)
Potassium: 4.4 mmol/L (ref 3.5–5.2)
Sodium: 142 mmol/L (ref 134–144)
eGFR: 87 mL/min/{1.73_m2} (ref >59)

## 2024-01-06 ENCOUNTER — Encounter: Payer: Self-pay | Admitting: Family Medicine

## 2024-01-06 DIAGNOSIS — Z23 Encounter for immunization: Secondary | ICD-10-CM | POA: Insufficient documentation

## 2024-01-06 MED ORDER — TOPIRAMATE 50 MG PO TABS: 50.0000 mg | ORAL_TABLET | Freq: Every day | ORAL | 3 refills | Status: AC

## 2024-01-06 MED ORDER — VENLAFAXINE HCL ER 150 MG PO CP24: 150.0000 mg | ORAL_CAPSULE | Freq: Every day | ORAL | 3 refills | Status: DC

## 2024-01-06 NOTE — Assessment & Plan Note (Signed)
 The increased risk of cardiovascular disease associated with this diagnosis, and the need to consistently work on lifestyle to change this is discussed. Following  a  heart healthy diet ,commitment to 30 minutes of exercise at least 5 days per week, as well as control of blood sugar and cholesterol , and achieving a healthy weight are all the areas to be addressed .

## 2024-01-06 NOTE — Assessment & Plan Note (Signed)
 Hyperlipidemia:Low fat diet discussed and encouraged.   Lipid Panel  Lab Results  Component Value Date   CHOL 131 09/02/2023   HDL 35 (L) 09/02/2023   LDLCALC 80 09/02/2023   TRIG 84 09/02/2023   CHOLHDL 3.7 09/02/2023     Needs to increase exercise commitment

## 2024-01-06 NOTE — Assessment & Plan Note (Signed)
  Patient re-educated about  the importance of commitment to a  minimum of 150 minutes of exercise per week as able.  The importance of healthy food choices with portion control discussed, as well as eating regularly and within a 12 hour window most days. The need to choose "clean , green" food 50 to 75% of the time is discussed, as well as to make water the primary drink and set a goal of 64 ounces water daily.       01/04/2024    4:05 PM 01/04/2024   11:03 AM 11/01/2023    3:51 PM  Weight /BMI  Weight 288 lb 1.9 oz 289 lb 3.2 oz 299 lb  Height 5\' 2"  (1.575 m) 5\' 2"  (1.575 m) 5\' 2"  (1.575 m)  BMI 52.7 kg/m2 52.9 kg/m2 54.69 kg/m2

## 2024-01-06 NOTE — Assessment & Plan Note (Signed)
 Depression controlled , reports increased anxiety/ panic attacks in past 3 months, no external inciting factors

## 2024-01-06 NOTE — Assessment & Plan Note (Signed)
 Controlled, no change in medication DASH diet and commitment to daily physical activity for a minimum of 30 minutes discussed and encouraged, as a part of hypertension management. The importance of attaining a healthy weight is also discussed.     01/04/2024    4:05 PM 01/04/2024   11:03 AM 11/01/2023    3:51 PM 10/06/2023    2:39 AM 10/05/2023   11:21 PM 10/05/2023    7:27 PM 10/05/2023    6:05 PM  BP/Weight  Systolic BP 115 132 137 119 103 132 116  Diastolic BP 77 77 77 74 66 84 76  Wt. (Lbs) 288.12 289.2 299      BMI 52.7 kg/m2 52.9 kg/m2 54.69 kg/m2

## 2024-01-06 NOTE — Progress Notes (Signed)
 Holly Alvarado     MRN: [id number]      DOB: June 04, 1966  Chief Complaint  Patient presents with   Follow-up    Discuss starting Wegovy for weightloss     HPI Holly Alvarado is here for follow up and re-evaluation of chronic medical conditions, medication management and review of any available recent lab and radiology data.  Preventive health is updated, specifically  Cancer screening and Immunization.   Questions or concerns regarding consultations or procedures which the PT has had in the interim are  addressed. The PT denies any adverse reactions to current medications since the last visit.  Challenged significantly with inability to lose weight despite haealthy lifestyle changes, wants to see if she can get wegovy C/o increased anxiety and panic attacks on avg once qweekly wants to resume benzo as needed for these episodes   ROS Denies recent fever or chills. Denies sinus pressure, nasal congestion, ear pain or sore throat. Denies chest congestion, productive cough or wheezing. Denies chest pains, palpitations and leg swelling Denies abdominal pain, nausea, vomiting,diarrhea or constipation.   Denies dysuria, frequency, hesitancy or incontinence. Denies joint pain, swelling and limitation in mobility. Denies headaches, seizures, numbness, or tingling.  Denies skin break down or rash.   PE  BP 115/77   Pulse 84   Resp 16   Ht 5\' 2"  (1.575 m)   Wt 288 lb 1.9 oz (130.7 kg)   SpO2 96%   BMI 52.70 kg/m   Patient alert and oriented and in no cardiopulmonary distress.  HEENT: No facial asymmetry, EOMI,     Neck supple .  Chest: Clear to auscultation bilaterally.  CVS: S1, S2 no murmurs, no S3.Regular rate.  ABD: Soft non tender.   Ext: No edema  MS: Adequate ROM spine, shoulders, hips and knees.  Skin: Intact, no ulcerations or rash noted.  Psych: Good eye contact, normal affect. Memory intact not anxious or depressed appearing.  CNS: CN 2-12 intact, power,  normal  throughout.no focal deficits noted.   Assessment & Plan  Prediabetes Patient educated about the importance of limiting  Carbohydrate intake , the need to commit to daily physical activity for a minimum of 30 minutes , and to commit weight loss. The fact that changes in all these areas will reduce or eliminate all together the development of diabetes is stressed.      Latest Ref Rng & Units 10/05/2023    7:43 PM 10/05/2023    2:47 PM 09/02/2023    8:10 AM 08/22/2023    1:26 PM 03/17/2023    8:55 AM  Diabetic Labs  HbA1c 4.8 - 5.6 %   6.4   6.4   Chol 100 - 199 mg/dL   409   811   HDL >91 mg/dL   35   35   Calc LDL 0 - 99 mg/dL   80   478   Triglycerides 0 - 149 mg/dL   84   295   Creatinine 0.44 - 1.00 mg/dL 6.21  3.08   6.57        01/04/2024    4:05 PM 01/04/2024   11:03 AM 11/01/2023    3:51 PM 10/06/2023    2:39 AM 10/05/2023   11:21 PM 10/05/2023    7:27 PM 10/05/2023    6:05 PM  BP/Weight  Systolic BP 115 132 137 119 103 132 116  Diastolic BP 77 77 77 74 66 84 76  Wt. (Lbs) 288.12 289.2 299  BMI 52.7 kg/m2 52.9 kg/m2 54.69 kg/m2           No data to display          Updated lab needed at/ before next visit.   GAD (generalized anxiety disorder) Uncontrolled with reports of intermittent panic attacks,Reports near panic attacks once per week, requests limited xanax  Encounter for immunization After obtaining informed consent, the  shingrix #2 vaccine is  administered , with no adverse effect noted at the time of administration.   Depression with anxiety Depression controlled , reports increased anxiety/ panic attacks in past 3 months, no external inciting factors  Dyslipidemia Hyperlipidemia:Low fat diet discussed and encouraged.   Lipid Panel  Lab Results  Component Value Date   CHOL 131 09/02/2023   HDL 35 (L) 09/02/2023   LDLCALC 80 09/02/2023   TRIG 84 09/02/2023   CHOLHDL 3.7 09/02/2023     Needs to increase exercise commitment  Essential  hypertension Controlled, no change in medication DASH diet and commitment to daily physical activity for a minimum of 30 minutes discussed and encouraged, as a part of hypertension management. The importance of attaining a healthy weight is also discussed.     01/04/2024    4:05 PM 01/04/2024   11:03 AM 11/01/2023    3:51 PM 10/06/2023    2:39 AM 10/05/2023   11:21 PM 10/05/2023    7:27 PM 10/05/2023    6:05 PM  BP/Weight  Systolic BP 115 132 137 119 103 132 116  Diastolic BP 77 77 77 74 66 84 76  Wt. (Lbs) 288.12 289.2 299      BMI 52.7 kg/m2 52.9 kg/m2 54.69 kg/m2           Metabolic syndrome X The increased risk of cardiovascular disease associated with this diagnosis, and the need to consistently work on lifestyle to change this is discussed. Following  a  heart healthy diet ,commitment to 30 minutes of exercise at least 5 days per week, as well as control of blood sugar and cholesterol , and achieving a healthy weight are all the areas to be addressed .   Obesity, morbid (HCC)  Patient re-educated about  the importance of commitment to a  minimum of 150 minutes of exercise per week as able.  The importance of healthy food choices with portion control discussed, as well as eating regularly and within a 12 hour window most days. The need to choose "clean , green" food 50 to 75% of the time is discussed, as well as to make water the primary drink and set a goal of 64 ounces water daily.       01/04/2024    4:05 PM 01/04/2024   11:03 AM 11/01/2023    3:51 PM  Weight /BMI  Weight 288 lb 1.9 oz 289 lb 3.2 oz 299 lb  Height 5\' 2"  (1.575 m) 5\' 2"  (1.575 m) 5\' 2"  (1.575 m)  BMI 52.7 kg/m2 52.9 kg/m2 54.69 kg/m2      GERD Controlled, no change in medication

## 2024-01-06 NOTE — Assessment & Plan Note (Signed)
 After obtaining informed consent, the shingrix #2  vaccine is  administered , with no adverse effect noted at the time of administration.

## 2024-01-06 NOTE — Assessment & Plan Note (Signed)
 Controlled, no change in medication

## 2024-01-08 ENCOUNTER — Encounter: Payer: Self-pay | Admitting: Neurology
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
## 2024-01-09 ENCOUNTER — Other Ambulatory Visit: Payer: Self-pay

## 2024-01-09 MED ORDER — LEVOCETIRIZINE DIHYDROCHLORIDE 5 MG PO TABS: 5.0000 mg | ORAL_TABLET | Freq: Every evening | ORAL | 0 refills | Status: DC

## 2024-01-09 NOTE — Telephone Encounter (Signed)
 Re[facility] from [facility] in [place] for a rx refill on the xyzal. I called the patient and left a detailed message informing that a refill has been sent to [facility] in [place].

## 2024-01-11 ENCOUNTER — Telehealth: Payer: Self-pay | Admitting: Neurology

## 2024-01-11 NOTE — Telephone Encounter (Signed)
 no auth required sent to [facility] [phone]

## 2024-02-12 ENCOUNTER — Other Ambulatory Visit: Payer: Self-pay | Admitting: Allergy & Immunology

## 2024-02-15 ENCOUNTER — Ambulatory Visit

## 2024-02-15 ENCOUNTER — Ambulatory Visit: Payer: Self-pay

## 2024-02-15 VITALS — BP 111/75 | HR 90 | Resp 16 | Ht 62.0 in | Wt 290.0 lb

## 2024-02-15 DIAGNOSIS — H00014 Hordeolum externum left upper eyelid: Secondary | ICD-10-CM

## 2024-02-15 MED ORDER — OFLOXACIN 0.3 % OP SOLN: 1.0000 [drp] | Freq: Four times a day (QID) | OPHTHALMIC | 0 refills | Status: AC

## 2024-02-15 NOTE — Telephone Encounter (Signed)
 Patient scheduled.

## 2024-02-15 NOTE — Telephone Encounter (Signed)
 Copied from CRM [id number]. Topic: Clinical - Red Word Triage >> Feb 15, 2024  9:51 AM Holly Alvarado wrote: Patient thinks she has a eye infection top eyelid is swollen and painful and making her face hurt. Says it's not pink eye and it feels like something in the eye. Started feeling itchy Friday last week and did eyedrops and by Monday it had swollen and started warm compress and now lid and inner eye theres so much pain.  Chief Complaint: left eye pain Symptoms: pain, swelling, itching, feels like something is in the eye, blurry vision Frequency: constant Pertinent Negatives: Patient denies fever,  Disposition: [x] ED /[] Urgent Care (no appt availability in office) / [] Appointment(In office/virtual)/ []  [facility]/ [] Home Care/ [] Refused Recommended Disposition /[] [facility]/ []  Follow-up with PCP Additional Notes: instructed to go to the ER; care advice given, denies questions; instructed to go to ER if becomes worse.   Reason for Disposition  [1] Foreign body sensation ("feels like something is in there") AND [2] irrigation didn't help  Answer Assessment - Initial Assessment Questions 1. ONSET: "When did the pain start?" (e.g., minutes, hours, days)     Last Friday 2. TIMING: "Does the pain come and go, or has it been constant since it started?" (e.g., constant, intermittent, fleeting)     constant 3. SEVERITY: "How bad is the pain?"   (Scale 1-10; mild, moderate or severe)   - MILD (1-3): doesn't interfere with normal activities    - MODERATE (4-7): interferes with normal activities or awakens from sleep    - SEVERE (8-10): excruciating pain and patient unable to do normal activities     moderate 4. LOCATION: "Where does it hurt?"  (e.g., eyelid, eye, cheekbone)     Left eye 5. CAUSE: "What do you think is causing the pain?"     unknown 6. VISION: "Do you have blurred vision or changes in your vision?"      Yes, draining something, blurry 7. EYE DISCHARGE: "Is  there any discharge (pus) from the eye(s)?"  If Yes, ask: "What color is it?"      clear 8. FEVER: "Do you have a fever?" If Yes, ask: "What is it, how was it measured, and when did it start?"      no 9. OTHER SYMPTOMS: "Do you have any other symptoms?" (e.g., headache, nasal discharge, facial rash)     Headache, face hurts 10. PREGNANCY: "Is there any chance you are pregnant?" "When was your last menstrual period?"       na  Protocols used: Eye Pain and Other Symptoms-A-AH

## 2024-02-15 NOTE — Telephone Encounter (Signed)
 Chief Complaint: Stye to left eye lid Symptoms: swelling to eye lid, redness to eye lid, drainage from the left eye Frequency: Onset Monday Pertinent Negatives: Patient denies other symptoms Disposition: [] ED /[] Urgent Care (no appt availability in office) / [x] Appointment(In office/virtual)/ []  [facility]/ [] Home Care/ [] Refused Recommended Disposition /[] [facility]/ []  Follow-up with PCP Additional Notes: Patient says she was triaged earlier and advised ED for a foreign object in her eye, but says it's a stye and she's not going to ED for a stye. She says it's on the lower left eye lid and it's been there since Monday when she noticed it. She says she's been applying warm compress to it, but it's not taking it away, so she would like to be seen in the office. Scheduled for today with a different provider due to no availability with PCP.   Copied from CRM [id number]. Topic: Clinical - Red Word Triage >> Feb 15, 2024  2:01 PM [name] wrote: Reason for CRM: pt right eyelid swollen and red  Reason for Disposition  [1] Eyelid is very swollen AND [2] no fever  Answer Assessment - Initial Assessment Questions 1. LOCATION: "Which eye has the sty?" "Upper or lower eyelid?"     Left upper eyelid 2. SIZE: "How big is it?" (Note: standard pencil eraser is 6 mm)     Smaller than a pencil eraser 3. EYELID: "Is the eyelid swollen?" If Yes, ask: "How much?"     Yes, most people can't tell, but I can tell it's swollen 4. REDNESS: "Has the redness spread onto the eyelid?"     Yes 5. ONSET: "When did you notice the sty?"     Monday, started doing warm compresses 6. VISION: "Do you have blurred vision?"      Only when wake up in the morning and have the crusty draiinage 7. PAIN: "Is it painful?" If Yes, ask: "How bad is the pain?"  (Scale 1-10; or mild, moderate, severe)     4-5/10 8. CONTACTS: "Do you wear contacts?"     No 9. OTHER SYMPTOMS: "Do you have any other symptoms?"  (e.g., fever)     Eye drainage only in the morning and maybe a couple times throughout the day  Protocols used: Sty-A-AH

## 2024-02-15 NOTE — Progress Notes (Unsigned)
   Acute Office Visit  Subjective:     Patient ID: Holly Alvarado, female    DOB: September 09, 1966, 58 y.o.   MRN: [id number]  Chief Complaint  Patient presents with   Stye    Last Fri she noticed some itching in the eye so she bought some allergy  eye drops but Left upper lid started getting puffy Monday. Has been using warm compresses without much relief. The inner corner will have pus and crust sometimes and sore.     HPI Patient is in today for stye in left eye  ROS      Objective:    BP 111/75   Pulse 90   Resp 16   Ht 5\' 2"  (1.575 m)   Wt 290 lb (131.5 kg)   SpO2 95%   BMI 53.04 kg/m  BP Readings from Last 3 Encounters:  02/15/24 111/75  01/04/24 115/77  01/04/24 132/77   Wt Readings from Last 3 Encounters:  02/15/24 290 lb (131.5 kg)  01/04/24 288 lb 1.9 oz (130.7 kg)  01/04/24 289 lb 3.2 oz (131.2 kg)     Physical Exam Vitals and nursing note reviewed.  Constitutional:      Appearance: Normal appearance.  HENT:     Head: Normocephalic.  Eyes:     General:        Right eye: No foreign body, discharge or hordeolum.        Left eye: Hordeolum present.No foreign body or discharge.     Extraocular Movements: Extraocular movements intact.     Conjunctiva/sclera: Conjunctivae normal.     Pupils: Pupils are equal, round, and reactive to light.  Neurological:     Mental Status: She is alert and oriented to person, place, and time.  Psychiatric:        Mood and Affect: Mood normal.        Thought Content: Thought content normal.     No results found for any visits on 02/15/24.      Assessment & Plan:   Problem List Items Addressed This Visit   None Visit Diagnoses       Hordeolum externum of left upper eyelid    -  Primary   treat with atbx eye drop and warm compresses to affected eye 2-3 times a day.  recommend opthlamalogy consult if no improvement in 5-7 days.   Relevant Medications   ofloxacin (OCUFLOX) 0.3 % ophthalmic solution       Meds  ordered this encounter  Medications   ofloxacin (OCUFLOX) 0.3 % ophthalmic solution    Sig: Place 1 drop into the left eye 4 (four) times daily for 7 days.    Dispense:  5 mL    Refill:  0    No follow-ups on file.  Alison Irvine, FNP

## 2024-02-17 ENCOUNTER — Ambulatory Visit: Payer: BC Managed Care – PPO | Admitting: Allergy & Immunology

## 2024-05-09 ENCOUNTER — Ambulatory Visit: Admitting: Family Medicine

## 2024-05-09 ENCOUNTER — Encounter: Payer: Self-pay | Admitting: Family Medicine

## 2024-05-09 VITALS — BP 98/66 | HR 98 | Resp 18 | Ht 62.0 in | Wt 293.0 lb

## 2024-05-09 DIAGNOSIS — F411 Generalized anxiety disorder: Secondary | ICD-10-CM

## 2024-05-09 DIAGNOSIS — Z23 Encounter for immunization: Secondary | ICD-10-CM | POA: Diagnosis not present

## 2024-05-09 DIAGNOSIS — F332 Major depressive disorder, recurrent severe without psychotic features: Secondary | ICD-10-CM

## 2024-05-09 DIAGNOSIS — K760 Fatty (change of) liver, not elsewhere classified: Secondary | ICD-10-CM

## 2024-05-09 DIAGNOSIS — K219 Gastro-esophageal reflux disease without esophagitis: Secondary | ICD-10-CM

## 2024-05-09 DIAGNOSIS — I10 Essential (primary) hypertension: Secondary | ICD-10-CM

## 2024-05-09 DIAGNOSIS — E559 Vitamin D deficiency, unspecified: Secondary | ICD-10-CM

## 2024-05-09 DIAGNOSIS — R7303 Prediabetes: Secondary | ICD-10-CM | POA: Diagnosis not present

## 2024-05-09 DIAGNOSIS — Z1231 Encounter for screening mammogram for malignant neoplasm of breast: Secondary | ICD-10-CM

## 2024-05-09 MED ORDER — ALPRAZOLAM 0.25 MG PO TABS: ORAL_TABLET | ORAL | 0 refills | Status: AC

## 2024-05-09 MED ORDER — SEMAGLUTIDE-WEIGHT MANAGEMENT 0.25 MG/0.5ML ~~LOC~~ SOAJ: 0.2500 mg | SUBCUTANEOUS | 0 refills | Status: AC

## 2024-05-09 MED ORDER — VENLAFAXINE HCL ER 150 MG PO CP24: 150.0000 mg | ORAL_CAPSULE | Freq: Every day | ORAL | 3 refills | Status: AC

## 2024-05-09 MED ORDER — SEMAGLUTIDE-WEIGHT MANAGEMENT 0.5 MG/0.5ML ~~LOC~~ SOAJ: 0.5000 mg | SUBCUTANEOUS | 0 refills | Status: AC

## 2024-05-09 MED ORDER — SEMAGLUTIDE-WEIGHT MANAGEMENT 2.4 MG/0.75ML ~~LOC~~ SOAJ
2.4000 mg | SUBCUTANEOUS | 0 refills | Status: DC
Start: 2024-09-02 — End: 2024-08-22

## 2024-05-09 MED ORDER — CARVEDILOL 6.25 MG PO TABS: 6.2500 mg | ORAL_TABLET | Freq: Two times a day (BID) | ORAL | 3 refills | Status: AC

## 2024-05-09 MED ORDER — SEMAGLUTIDE-WEIGHT MANAGEMENT 1 MG/0.5ML ~~LOC~~ SOAJ: 1.0000 mg | SUBCUTANEOUS | 0 refills | Status: AC

## 2024-05-09 MED ORDER — VITAMIN D (ERGOCALCIFEROL) 1.25 MG (50000 UNIT) PO CAPS: 50000.0000 [IU] | ORAL_CAPSULE | ORAL | 2 refills | Status: AC

## 2024-05-09 MED ORDER — AMLODIPINE BESYLATE 2.5 MG PO TABS: 2.5000 mg | ORAL_TABLET | Freq: Every day | ORAL | 3 refills | Status: AC

## 2024-05-09 MED ORDER — SEMAGLUTIDE-WEIGHT MANAGEMENT 1.7 MG/0.75ML ~~LOC~~ SOAJ: 1.7000 mg | SUBCUTANEOUS | 0 refills | Status: DC

## 2024-05-09 NOTE — Patient Instructions (Addendum)
 F/U in 12 to 14 weeks  Wegovy  script is sent to your pharmacy  Pls schedule mammogram at checkout , [facility]  PLEASE DO follow through on aLL the support tools you are putting into place for weight loss success, it is absolutely worth it!  Labs today, cmp and eGR, TSH , vIT D , HBA1c, CBC, PT/INR and AfP tumor marker, I will forward your labs to [facility] that they wanted to review   Pneumonia 20 today  Please contact the Neurology office to follow through on getting the scans Dr Ines had recommended  It is important that you exercise regularly at least 30 minutes 5 times a week. If you develop chest pain, have severe difficulty breathing, or feel very tired, stop exercising immediately and seek medical attention  Think about what you will eat, plan ahead. Choose  clean, green, fresh or frozen over canned, processed or packaged foods which are more sugary, salty and fatty. 70 to 75% of food eaten should be vegetables and fruit. Three meals at set times with snacks allowed between meals, but they must be fruit or vegetables. Aim to eat over a 12 hour period , example 7 am to 7 pm, and STOP after  your last meal of the day. Drink water ,generally about 64 ounces per day, no other drink is as healthy. Fruit juice is best enjoyed in a healthy way, by EATING the fruit. Thanks for choosing [facility], we consider it a privelige to serve you.

## 2024-05-10 ENCOUNTER — Ambulatory Visit: Payer: Self-pay | Admitting: Family Medicine

## 2024-05-10 LAB — CMP14+EGFR
ALT: 37 IU/L — ABNORMAL HIGH (ref 0–32)
AST: 69 IU/L — ABNORMAL HIGH (ref 0–40)
Albumin: 3.6 g/dL — ABNORMAL LOW (ref 3.8–4.9)
Alkaline Phosphatase: 209 IU/L — ABNORMAL HIGH (ref 44–121)
BUN/Creatinine Ratio: 8 — ABNORMAL LOW (ref 9–23)
BUN: 7 mg/dL (ref 6–24)
Bilirubin Total: 0.6 mg/dL (ref 0.0–1.2)
CO2: 22 mmol/L (ref 20–29)
Calcium: 9 mg/dL (ref 8.7–10.2)
Chloride: 108 mmol/L — ABNORMAL HIGH (ref 96–106)
Creatinine, Ser: 0.86 mg/dL (ref 0.57–1.00)
Globulin, Total: 4 g/dL (ref 1.5–4.5)
Glucose: 96 mg/dL (ref 70–99)
Potassium: 4.2 mmol/L (ref 3.5–5.2)
Sodium: 143 mmol/L (ref 134–144)
Total Protein: 7.6 g/dL (ref 6.0–8.5)
eGFR: 78 mL/min/1.73 (ref >59)

## 2024-05-10 LAB — CBC WITH DIFFERENTIAL/PLATELET
Basophils Absolute: 0.1 10*3/uL (ref 0.0–0.2)
Basos: 1 %
EOS (ABSOLUTE): 0.2 10*3/uL (ref 0.0–0.4)
Eos: 2 %
Hematocrit: 45.5 % (ref 34.0–46.6)
Hemoglobin: 13.5 g/dL (ref 11.1–15.9)
Immature Grans (Abs): 0 10*3/uL (ref 0.0–0.1)
Immature Granulocytes: 0 %
Lymphocytes Absolute: 5.8 10*3/uL — ABNORMAL HIGH (ref 0.7–3.1)
Lymphs: 72 %
MCH: 23 pg — ABNORMAL LOW (ref 26.6–33.0)
MCHC: 29.7 g/dL — ABNORMAL LOW (ref 31.5–35.7)
MCV: 78 fL — ABNORMAL LOW (ref 79–97)
Monocytes Absolute: 0.5 10*3/uL (ref 0.1–0.9)
Monocytes: 6 %
Neutrophils Absolute: 1.5 10*3/uL (ref 1.4–7.0)
Neutrophils: 19 %
Platelets: 181 10*3/uL (ref 150–450)
RBC: 5.86 x10E6/uL — ABNORMAL HIGH (ref 3.77–5.28)
RDW: 16.7 % — ABNORMAL HIGH (ref 11.7–15.4)
WBC: 8 10*3/uL (ref 3.4–10.8)

## 2024-05-10 LAB — HEMOGLOBIN A1C
Est. average glucose Bld gHb Est-mCnc: 131 mg/dL
Hgb A1c MFr Bld: 6.2 % — ABNORMAL HIGH (ref 4.8–5.6)

## 2024-05-10 LAB — VITAMIN D 25 HYDROXY (VIT D DEFICIENCY, FRACTURES): Vit D, 25-Hydroxy: 22.1 ng/mL — ABNORMAL LOW (ref 30.0–100.0)

## 2024-05-10 LAB — PT AND PTT
INR: 1 (ref 0.9–1.2)
Prothrombin Time: 11.1 s (ref 9.1–12.0)
aPTT: 26 s (ref 24–33)

## 2024-05-10 LAB — AFP TUMOR MARKER: AFP, Serum, Tumor Marker: 5.4 ng/mL (ref 0.0–9.2)

## 2024-05-10 LAB — TSH: TSH: 1.38 u[IU]/mL (ref 0.450–4.500)

## 2024-05-17 ENCOUNTER — Ambulatory Visit (INDEPENDENT_AMBULATORY_CARE_PROVIDER_SITE_OTHER): Admitting: Gastroenterology

## 2024-05-17 ENCOUNTER — Encounter (INDEPENDENT_AMBULATORY_CARE_PROVIDER_SITE_OTHER): Payer: Self-pay | Admitting: Gastroenterology

## 2024-05-24 ENCOUNTER — Ambulatory Visit

## 2024-05-29 ENCOUNTER — Encounter: Payer: Self-pay | Admitting: Family Medicine

## 2024-05-29 DIAGNOSIS — Z1231 Encounter for screening mammogram for malignant neoplasm of breast: Secondary | ICD-10-CM | POA: Insufficient documentation

## 2024-05-29 NOTE — Assessment & Plan Note (Signed)
 Updated lab needed at/ before next visit.

## 2024-05-29 NOTE — Progress Notes (Signed)
 Holly Alvarado     MRN: [id number]      DOB: 1966/06/17  Chief Complaint  Patient presents with   Medical Management of Chronic Issues    18 week follow up. Wanting to get on weightloss med     HPI Holly Alvarado is here for follow up and re-evaluation of chronic medical conditions, medication management and review of any available recent lab and radiology data.  Preventive health is updated, specifically  Cancer screening and Immunization.   Questions or concerns regarding consultations or procedures which the PT has had in the interim are  addressed. The PT denies any adverse reactions to current medications since the last visit.  Ready to start weight loss journey and is enroling in a support sytem ROS Denies recent fever or chills. Denies sinus pressure, nasal congestion, ear pain or sore throat. Denies chest congestion, productive cough or wheezing. Denies chest pains, palpitations and leg swelling Denies abdominal pain, nausea, vomiting,diarrhea or constipation.   Denies dysuria, frequency, hesitancy or incontinence. Denies joint pain, swelling and limitation in mobility. Denies headaches, seizures, numbness, or tingling. Denies depression, anxiety or insomnia. Denies skin break down or rash.   PE  BP 98/66   Pulse 98   Resp 18   Ht 5' 2 (1.575 m)   Wt 293 lb (132.9 kg)   SpO2 98%   BMI 53.59 kg/m   Patient alert and oriented and in no cardiopulmonary distress.  HEENT: No facial asymmetry, EOMI,     Neck supple .  Chest: Clear to auscultation bilaterally.  CVS: S1, S2 no murmurs, no S3.Regular rate.  ABD: Soft non tender.   Ext: No edema  MS: Adequate ROM spine, shoulders, hips and knees.  Skin: Intact, no ulcerations or rash noted.  Psych: Good eye contact, normal affect. Memory intact not anxious or depressed appearing.  CNS: CN 2-12 intact, power,  normal throughout.no focal deficits noted.   Assessment & Plan Obesity, morbid (HCC)  Patient  re-educated about  the importance of commitment to a  minimum of 150 minutes of exercise per week as able.  The importance of healthy food choices with portion control discussed, as well as eating regularly and within a 12 hour window most days. The need to choose clean , green food 50 to 75% of the time is discussed, as well as to make water  the primary drink and set a goal of 64 ounces water  daily.       05/09/2024    1:48 PM 02/15/2024    3:39 PM 01/04/2024    4:05 PM  Weight /BMI  Weight 293 lb 290 lb 288 lb 1.9 oz  Height 5' 2 (1.575 m) 5' 2 (1.575 m) 5' 2 (1.575 m)  BMI 53.59 kg/m2 53.04 kg/m2 52.7 kg/m2    Start wegovy   Severe recurrent major depression without psychotic features (HCC) Controlled, no change in medication   Essential hypertension Controlled, no change in medication DASH diet and commitment to daily physical activity for a minimum of 30 minutes discussed and encouraged, as a part of hypertension management. The importance of attaining a healthy weight is also discussed.     05/09/2024    1:48 PM 02/15/2024    3:39 PM 01/04/2024    4:05 PM 01/04/2024   11:03 AM 11/01/2023    3:51 PM 10/06/2023    2:39 AM 10/05/2023   11:21 PM  BP/Weight  Systolic BP 98 111 115 132 137 119 103  Diastolic BP 66  75 77 77 77 74 66  Wt. (Lbs) 293 290 288.12 289.2 299    BMI 53.59 kg/m2 53.04 kg/m2 52.7 kg/m2 52.9 kg/m2 54.69 kg/m2         GERD Controlled, no change in medication   GAD (generalized anxiety disorder) Controlled, no change in medication   Encounter for immunization After obtaining informed consent, the Pneumonia 20vaccine is  administered , with no adverse effect noted at the time of administration.   Steatosis of liver Labs ordered  per GI request  Vitamin D  deficiency Updated lab needed at/ before next visit.

## 2024-05-29 NOTE — Assessment & Plan Note (Signed)
 Controlled, no change in medication

## 2024-05-29 NOTE — Assessment & Plan Note (Signed)
 Controlled, no change in medication DASH diet and commitment to daily physical activity for a minimum of 30 minutes discussed and encouraged, as a part of hypertension management. The importance of attaining a healthy weight is also discussed.     05/09/2024    1:48 PM 02/15/2024    3:39 PM 01/04/2024    4:05 PM 01/04/2024   11:03 AM 11/01/2023    3:51 PM 10/06/2023    2:39 AM 10/05/2023   11:21 PM  BP/Weight  Systolic BP 98 111 115 132 137 119 103  Diastolic BP 66 75 77 77 77 74 66  Wt. (Lbs) 293 290 288.12 289.2 299    BMI 53.59 kg/m2 53.04 kg/m2 52.7 kg/m2 52.9 kg/m2 54.69 kg/m2

## 2024-05-29 NOTE — Assessment & Plan Note (Signed)
  Patient re-educated about  the importance of commitment to a  minimum of 150 minutes of exercise per week as able.  The importance of healthy food choices with portion control discussed, as well as eating regularly and within a 12 hour window most days. The need to choose clean , green food 50 to 75% of the time is discussed, as well as to make water  the primary drink and set a goal of 64 ounces water  daily.       05/09/2024    1:48 PM 02/15/2024    3:39 PM 01/04/2024    4:05 PM  Weight /BMI  Weight 293 lb 290 lb 288 lb 1.9 oz  Height 5' 2 (1.575 m) 5' 2 (1.575 m) 5' 2 (1.575 m)  BMI 53.59 kg/m2 53.04 kg/m2 52.7 kg/m2    Start wegovy

## 2024-05-29 NOTE — Assessment & Plan Note (Signed)
 After obtaining informed consent, the  Pneumonia 20 vaccine is  administered , with no adverse effect noted at the time of administration.

## 2024-05-29 NOTE — Assessment & Plan Note (Signed)
 Labs ordered  per GI request

## 2024-06-11 ENCOUNTER — Telehealth: Payer: Self-pay | Admitting: Neurology

## 2024-06-11 NOTE — Telephone Encounter (Signed)
 VM box full, sent mychart msg informing pt of need to reschedule 06/12/24 appt - MD out

## 2024-06-12 ENCOUNTER — Ambulatory Visit: Admitting: Neurology

## 2024-06-14 ENCOUNTER — Telehealth (INDEPENDENT_AMBULATORY_CARE_PROVIDER_SITE_OTHER): Payer: Self-pay

## 2024-06-14 NOTE — Telephone Encounter (Signed)
 SABRA

## 2024-06-14 NOTE — Telephone Encounter (Signed)
 Patient needs ov per [facility]

## 2024-06-22 ENCOUNTER — Ambulatory Visit
Admission: RE | Admit: 2024-06-22 | Discharge: 2024-06-22 | Disposition: A | Discharge status: Home / Self Care | Source: Ambulatory Visit | Attending: Family Medicine | Admitting: Family Medicine

## 2024-06-22 DIAGNOSIS — Z1231 Encounter for screening mammogram for malignant neoplasm of breast: Secondary | ICD-10-CM

## 2024-06-27 ENCOUNTER — Telehealth: Payer: Self-pay | Admitting: Neurology

## 2024-06-27 NOTE — Telephone Encounter (Signed)
 VM box full, sent mychart msg informing pt to call back to reschedule appt with another provider - MD leaving practice

## 2024-07-05 ENCOUNTER — Ambulatory Visit (INDEPENDENT_AMBULATORY_CARE_PROVIDER_SITE_OTHER): Admitting: Gastroenterology

## 2024-07-11 ENCOUNTER — Encounter (INDEPENDENT_AMBULATORY_CARE_PROVIDER_SITE_OTHER): Payer: Self-pay | Admitting: Gastroenterology

## 2024-07-13 ENCOUNTER — Encounter: Payer: Self-pay | Admitting: Family Medicine

## 2024-08-17 ENCOUNTER — Ambulatory Visit: Admitting: Family Medicine

## 2024-08-22 ENCOUNTER — Other Ambulatory Visit: Payer: Self-pay | Admitting: Internal Medicine

## 2024-08-22 ENCOUNTER — Other Ambulatory Visit: Payer: Self-pay | Admitting: Family Medicine

## 2024-08-22 MED ORDER — WEGOVY 0.5 MG/0.5ML ~~LOC~~ SOAJ
0.5000 mg | SUBCUTANEOUS | 0 refills | Status: AC
Start: 2024-08-22 — End: ?

## 2024-11-06 ENCOUNTER — Ambulatory Visit: Admitting: Family Medicine
